# Patient Record
Sex: Female | Born: 1965
Health system: Southern US, Community
[De-identification: ages and names within clinical notes are randomized; demographics above are authoritative.]

## PROBLEM LIST (undated history)

## (undated) ENCOUNTER — Ambulatory Visit (HOSPITAL_COMMUNITY): Admission: EM | Source: Home / Self Care

## (undated) DIAGNOSIS — R112 Nausea with vomiting, unspecified: Secondary | ICD-10-CM

## (undated) DIAGNOSIS — Z9221 Personal history of antineoplastic chemotherapy: Secondary | ICD-10-CM

## (undated) DIAGNOSIS — K649 Unspecified hemorrhoids: Secondary | ICD-10-CM

## (undated) DIAGNOSIS — C50919 Malignant neoplasm of unspecified site of unspecified female breast: Secondary | ICD-10-CM

## (undated) DIAGNOSIS — Z9889 Other specified postprocedural states: Secondary | ICD-10-CM

## (undated) DIAGNOSIS — Z170 Estrogen receptor positive status [ER+]: Principal | ICD-10-CM

## (undated) DIAGNOSIS — C50311 Malignant neoplasm of lower-inner quadrant of right female breast: Secondary | ICD-10-CM

## (undated) DIAGNOSIS — T8859XA Other complications of anesthesia, initial encounter: Secondary | ICD-10-CM

## (undated) DIAGNOSIS — M549 Dorsalgia, unspecified: Secondary | ICD-10-CM

## (undated) DIAGNOSIS — T4145XA Adverse effect of unspecified anesthetic, initial encounter: Secondary | ICD-10-CM

## (undated) DIAGNOSIS — I10 Essential (primary) hypertension: Secondary | ICD-10-CM

## (undated) DIAGNOSIS — K5792 Diverticulitis of intestine, part unspecified, without perforation or abscess without bleeding: Secondary | ICD-10-CM

## (undated) DIAGNOSIS — T7840XA Allergy, unspecified, initial encounter: Secondary | ICD-10-CM

## (undated) DIAGNOSIS — K760 Fatty (change of) liver, not elsewhere classified: Secondary | ICD-10-CM

## (undated) DIAGNOSIS — M2550 Pain in unspecified joint: Secondary | ICD-10-CM

## (undated) DIAGNOSIS — A048 Other specified bacterial intestinal infections: Secondary | ICD-10-CM

## (undated) DIAGNOSIS — K602 Anal fissure, unspecified: Secondary | ICD-10-CM

## (undated) DIAGNOSIS — K5900 Constipation, unspecified: Secondary | ICD-10-CM

## (undated) DIAGNOSIS — Z923 Personal history of irradiation: Secondary | ICD-10-CM

## (undated) DIAGNOSIS — R12 Heartburn: Secondary | ICD-10-CM

## (undated) DIAGNOSIS — K219 Gastro-esophageal reflux disease without esophagitis: Secondary | ICD-10-CM

## (undated) DIAGNOSIS — K648 Other hemorrhoids: Secondary | ICD-10-CM

## (undated) DIAGNOSIS — R0602 Shortness of breath: Secondary | ICD-10-CM

## (undated) HISTORY — DX: Unspecified hemorrhoids: K64.9

## (undated) HISTORY — DX: Shortness of breath: R06.02

## (undated) HISTORY — PX: COLONOSCOPY: SHX174

## (undated) HISTORY — PX: COLON RESECTION: SHX5231

## (undated) HISTORY — DX: Allergy, unspecified, initial encounter: T78.40XA

## (undated) HISTORY — DX: Pain in unspecified joint: M25.50

## (undated) HISTORY — PX: OTHER SURGICAL HISTORY: SHX169

## (undated) HISTORY — DX: Constipation, unspecified: K59.00

## (undated) HISTORY — DX: Dorsalgia, unspecified: M54.9

## (undated) HISTORY — PX: TONSILLECTOMY: SUR1361

## (undated) HISTORY — PX: FOOT SURGERY: SHX648

## (undated) HISTORY — DX: Gastro-esophageal reflux disease without esophagitis: K21.9

## (undated) HISTORY — PX: KNEE SURGERY: SHX244

## (undated) HISTORY — DX: Malignant neoplasm of lower-inner quadrant of right female breast: C50.311

## (undated) HISTORY — DX: Estrogen receptor positive status (ER+): Z17.0

## (undated) HISTORY — DX: Other hemorrhoids: K64.8

## (undated) HISTORY — DX: Other specified bacterial intestinal infections: A04.8

## (undated) HISTORY — DX: Anal fissure, unspecified: K60.2

## (undated) HISTORY — DX: Fatty (change of) liver, not elsewhere classified: K76.0

## (undated) HISTORY — DX: Heartburn: R12

## (undated) HISTORY — PX: TUBAL LIGATION: SHX77

## (undated) HISTORY — DX: Malignant neoplasm of unspecified site of unspecified female breast: C50.919

---

## 1998-06-09 ENCOUNTER — Emergency Department (HOSPITAL_COMMUNITY): Admission: EM | Admit: 1998-06-09 | Discharge: 1998-06-09 | Payer: Self-pay | Admitting: Emergency Medicine

## 1998-06-09 ENCOUNTER — Encounter: Payer: Self-pay | Admitting: Emergency Medicine

## 1998-08-23 ENCOUNTER — Other Ambulatory Visit: Admission: RE | Admit: 1998-08-23 | Discharge: 1998-08-23 | Payer: Self-pay | Admitting: *Deleted

## 2000-12-07 ENCOUNTER — Emergency Department (HOSPITAL_COMMUNITY): Admission: EM | Admit: 2000-12-07 | Discharge: 2000-12-07 | Payer: Self-pay | Admitting: *Deleted

## 2001-04-23 ENCOUNTER — Inpatient Hospital Stay (HOSPITAL_COMMUNITY): Admission: AD | Admit: 2001-04-23 | Discharge: 2001-04-23 | Payer: Self-pay | Admitting: *Deleted

## 2001-04-23 ENCOUNTER — Encounter: Payer: Self-pay | Admitting: *Deleted

## 2002-01-13 ENCOUNTER — Emergency Department (HOSPITAL_COMMUNITY): Admission: EM | Admit: 2002-01-13 | Discharge: 2002-01-13 | Payer: Self-pay | Admitting: Emergency Medicine

## 2002-02-05 ENCOUNTER — Encounter: Admission: RE | Admit: 2002-02-05 | Discharge: 2002-02-05 | Payer: Self-pay | Admitting: Internal Medicine

## 2002-03-24 ENCOUNTER — Encounter: Admission: RE | Admit: 2002-03-24 | Discharge: 2002-03-24 | Payer: Self-pay | Admitting: Internal Medicine

## 2002-10-03 ENCOUNTER — Other Ambulatory Visit: Admission: RE | Admit: 2002-10-03 | Discharge: 2002-10-03 | Payer: Self-pay | Admitting: Obstetrics and Gynecology

## 2003-09-16 ENCOUNTER — Emergency Department (HOSPITAL_COMMUNITY): Admission: EM | Admit: 2003-09-16 | Discharge: 2003-09-17 | Payer: Self-pay | Admitting: Emergency Medicine

## 2003-10-07 ENCOUNTER — Other Ambulatory Visit: Admission: RE | Admit: 2003-10-07 | Discharge: 2003-10-07 | Payer: Self-pay | Admitting: Obstetrics and Gynecology

## 2003-10-13 ENCOUNTER — Ambulatory Visit (HOSPITAL_COMMUNITY): Admission: RE | Admit: 2003-10-13 | Discharge: 2003-10-13 | Payer: Self-pay | Admitting: Obstetrics and Gynecology

## 2004-10-07 ENCOUNTER — Other Ambulatory Visit: Admission: RE | Admit: 2004-10-07 | Discharge: 2004-10-07 | Payer: Self-pay | Admitting: Obstetrics and Gynecology

## 2004-12-23 ENCOUNTER — Emergency Department (HOSPITAL_COMMUNITY): Admission: EM | Admit: 2004-12-23 | Discharge: 2004-12-23 | Payer: Self-pay | Admitting: Emergency Medicine

## 2005-02-21 ENCOUNTER — Emergency Department (HOSPITAL_COMMUNITY): Admission: EM | Admit: 2005-02-21 | Discharge: 2005-02-21 | Payer: Self-pay | Admitting: Emergency Medicine

## 2005-06-16 ENCOUNTER — Ambulatory Visit: Payer: Self-pay | Admitting: Gastroenterology

## 2005-06-26 ENCOUNTER — Ambulatory Visit: Payer: Self-pay | Admitting: Gastroenterology

## 2005-07-04 ENCOUNTER — Ambulatory Visit: Payer: Self-pay | Admitting: Gastroenterology

## 2005-07-07 ENCOUNTER — Encounter (INDEPENDENT_AMBULATORY_CARE_PROVIDER_SITE_OTHER): Payer: Self-pay | Admitting: *Deleted

## 2005-07-07 ENCOUNTER — Ambulatory Visit: Payer: Self-pay | Admitting: Gastroenterology

## 2005-07-07 DIAGNOSIS — K219 Gastro-esophageal reflux disease without esophagitis: Secondary | ICD-10-CM

## 2005-07-07 HISTORY — DX: Gastro-esophageal reflux disease without esophagitis: K21.9

## 2005-08-01 ENCOUNTER — Ambulatory Visit: Payer: Self-pay | Admitting: Gastroenterology

## 2006-02-07 ENCOUNTER — Emergency Department (HOSPITAL_COMMUNITY): Admission: EM | Admit: 2006-02-07 | Discharge: 2006-02-07 | Payer: Self-pay | Admitting: Emergency Medicine

## 2006-03-01 ENCOUNTER — Ambulatory Visit (HOSPITAL_COMMUNITY): Admission: RE | Admit: 2006-03-01 | Discharge: 2006-03-01 | Payer: Self-pay | Admitting: Obstetrics and Gynecology

## 2006-03-01 ENCOUNTER — Other Ambulatory Visit: Admission: RE | Admit: 2006-03-01 | Discharge: 2006-03-01 | Payer: Self-pay | Admitting: Obstetrics and Gynecology

## 2006-04-06 ENCOUNTER — Ambulatory Visit: Payer: Self-pay | Admitting: Gastroenterology

## 2006-04-09 ENCOUNTER — Ambulatory Visit: Payer: Self-pay | Admitting: Internal Medicine

## 2006-04-11 ENCOUNTER — Emergency Department (HOSPITAL_COMMUNITY): Admission: EM | Admit: 2006-04-11 | Discharge: 2006-04-12 | Payer: Self-pay | Admitting: Emergency Medicine

## 2006-05-04 ENCOUNTER — Ambulatory Visit: Payer: Self-pay | Admitting: Gastroenterology

## 2006-06-04 ENCOUNTER — Encounter: Admission: RE | Admit: 2006-06-04 | Discharge: 2006-06-04 | Payer: Self-pay | Admitting: Surgery

## 2006-06-27 ENCOUNTER — Inpatient Hospital Stay (HOSPITAL_COMMUNITY): Admission: RE | Admit: 2006-06-27 | Discharge: 2006-07-02 | Payer: Self-pay | Admitting: Surgery

## 2006-10-15 ENCOUNTER — Ambulatory Visit: Payer: Self-pay | Admitting: Internal Medicine

## 2006-10-29 ENCOUNTER — Ambulatory Visit: Payer: Self-pay | Admitting: Internal Medicine

## 2006-10-31 ENCOUNTER — Ambulatory Visit: Payer: Self-pay

## 2006-11-02 ENCOUNTER — Ambulatory Visit: Payer: Self-pay

## 2007-02-13 ENCOUNTER — Ambulatory Visit: Payer: Self-pay | Admitting: Internal Medicine

## 2007-03-04 ENCOUNTER — Ambulatory Visit (HOSPITAL_COMMUNITY): Admission: RE | Admit: 2007-03-04 | Discharge: 2007-03-04 | Payer: Self-pay | Admitting: Obstetrics and Gynecology

## 2007-03-22 ENCOUNTER — Ambulatory Visit: Payer: Self-pay | Admitting: Internal Medicine

## 2007-04-03 ENCOUNTER — Encounter: Admission: RE | Admit: 2007-04-03 | Discharge: 2007-04-03 | Payer: Self-pay | Admitting: Internal Medicine

## 2007-04-03 ENCOUNTER — Ambulatory Visit: Payer: Self-pay | Admitting: Internal Medicine

## 2007-04-12 ENCOUNTER — Emergency Department (HOSPITAL_COMMUNITY): Admission: EM | Admit: 2007-04-12 | Discharge: 2007-04-12 | Payer: Self-pay | Admitting: Emergency Medicine

## 2007-05-13 ENCOUNTER — Ambulatory Visit: Payer: Self-pay | Admitting: Internal Medicine

## 2007-05-13 ENCOUNTER — Encounter: Payer: Self-pay | Admitting: Internal Medicine

## 2007-05-13 DIAGNOSIS — R1011 Right upper quadrant pain: Secondary | ICD-10-CM | POA: Insufficient documentation

## 2007-05-14 LAB — CONVERTED CEMR LAB
ALT: 16 units/L (ref 0–35)
AST: 20 units/L (ref 0–37)
Albumin: 3.9 g/dL (ref 3.5–5.2)
Alkaline Phosphatase: 63 units/L (ref 39–117)
Amylase: 65 units/L (ref 27–131)
Bilirubin, Direct: 0.1 mg/dL (ref 0.0–0.3)
Lipase: 20 units/L (ref 11.0–59.0)
Total Bilirubin: 0.6 mg/dL (ref 0.3–1.2)
Total Protein: 7.8 g/dL (ref 6.0–8.3)

## 2007-05-17 ENCOUNTER — Encounter: Admission: RE | Admit: 2007-05-17 | Discharge: 2007-05-17 | Payer: Self-pay | Admitting: Internal Medicine

## 2007-06-17 ENCOUNTER — Encounter: Admission: RE | Admit: 2007-06-17 | Discharge: 2007-06-17 | Payer: Self-pay | Admitting: Internal Medicine

## 2007-06-17 ENCOUNTER — Ambulatory Visit: Payer: Self-pay | Admitting: Internal Medicine

## 2007-06-17 LAB — CONVERTED CEMR LAB
Basophils Absolute: 0 10*3/uL (ref 0.0–0.1)
Basophils Relative: 0.4 % (ref 0.0–1.0)
Bilirubin Urine: NEGATIVE
Eosinophils Absolute: 0.1 10*3/uL (ref 0.0–0.6)
Eosinophils Relative: 1 % (ref 0.0–5.0)
HCT: 35.6 % — ABNORMAL LOW (ref 36.0–46.0)
Hemoglobin, Urine: NEGATIVE
Hemoglobin: 12.5 g/dL (ref 12.0–15.0)
Ketones, ur: NEGATIVE mg/dL
Leukocytes, UA: NEGATIVE
Lymphocytes Relative: 35.1 % (ref 12.0–46.0)
MCHC: 35 g/dL (ref 30.0–36.0)
MCV: 89.8 fL (ref 78.0–100.0)
Monocytes Absolute: 0.4 10*3/uL (ref 0.2–0.7)
Monocytes Relative: 7.2 % (ref 3.0–11.0)
Neutro Abs: 3.1 10*3/uL (ref 1.4–7.7)
Neutrophils Relative %: 56.3 % (ref 43.0–77.0)
Nitrite: NEGATIVE
Platelets: 138 10*3/uL — ABNORMAL LOW (ref 150–400)
RBC: 3.96 M/uL (ref 3.87–5.11)
RDW: 12.4 % (ref 11.5–14.6)
Specific Gravity, Urine: 1.01 (ref 1.000–1.03)
Total Protein, Urine: NEGATIVE mg/dL
Urine Glucose: NEGATIVE mg/dL
Urobilinogen, UA: 0.2 (ref 0.0–1.0)
WBC: 5.5 10*3/uL (ref 4.5–10.5)
pH: 6 (ref 5.0–8.0)

## 2007-06-24 ENCOUNTER — Telehealth: Payer: Self-pay | Admitting: Internal Medicine

## 2007-07-26 ENCOUNTER — Ambulatory Visit: Payer: Self-pay | Admitting: Gastroenterology

## 2007-07-26 LAB — CONVERTED CEMR LAB
ALT: 18 units/L (ref 0–35)
AST: 21 units/L (ref 0–37)
Albumin: 3.8 g/dL (ref 3.5–5.2)
Alkaline Phosphatase: 57 units/L (ref 39–117)
Amylase: 57 units/L (ref 27–131)
BUN: 9 mg/dL (ref 6–23)
Basophils Absolute: 0 10*3/uL (ref 0.0–0.1)
Basophils Relative: 0.6 % (ref 0.0–1.0)
Bilirubin, Direct: 0.1 mg/dL (ref 0.0–0.3)
CO2: 29 meq/L (ref 19–32)
Calcium: 9.3 mg/dL (ref 8.4–10.5)
Chloride: 102 meq/L (ref 96–112)
Creatinine, Ser: 0.9 mg/dL (ref 0.4–1.2)
Eosinophils Absolute: 0 10*3/uL (ref 0.0–0.6)
Eosinophils Relative: 0.7 % (ref 0.0–5.0)
Ferritin: 32.5 ng/mL (ref 10.0–291.0)
Folate: 14.1 ng/mL
GFR calc Af Amer: 88 mL/min
GFR calc non Af Amer: 73 mL/min
Glucose, Bld: 89 mg/dL (ref 70–99)
HCT: 35.9 % — ABNORMAL LOW (ref 36.0–46.0)
Hemoglobin: 12.5 g/dL (ref 12.0–15.0)
Iron: 88 ug/dL (ref 42–145)
Lipase: 25 units/L (ref 11.0–59.0)
Lymphocytes Relative: 26.9 % (ref 12.0–46.0)
MCHC: 34.9 g/dL (ref 30.0–36.0)
MCV: 88.8 fL (ref 78.0–100.0)
Monocytes Absolute: 0.4 10*3/uL (ref 0.2–0.7)
Monocytes Relative: 8.1 % (ref 3.0–11.0)
Neutro Abs: 3.5 10*3/uL (ref 1.4–7.7)
Neutrophils Relative %: 63.7 % (ref 43.0–77.0)
Platelets: 132 10*3/uL — ABNORMAL LOW (ref 150–400)
Potassium: 3.5 meq/L (ref 3.5–5.1)
RBC: 4.04 M/uL (ref 3.87–5.11)
RDW: 12.8 % (ref 11.5–14.6)
Saturation Ratios: 22.1 % (ref 20.0–50.0)
Sed Rate: 31 mm/hr — ABNORMAL HIGH (ref 0–25)
Sodium: 137 meq/L (ref 135–145)
TSH: 0.98 microintl units/mL (ref 0.35–5.50)
Total Bilirubin: 0.7 mg/dL (ref 0.3–1.2)
Total Protein: 7.3 g/dL (ref 6.0–8.3)
Transferrin: 284 mg/dL (ref >=212.0)
Vitamin B-12: 200 pg/mL — ABNORMAL LOW (ref 211–911)
WBC: 5.4 10*3/uL (ref 4.5–10.5)

## 2007-07-29 ENCOUNTER — Encounter: Payer: Self-pay | Admitting: Internal Medicine

## 2007-07-29 ENCOUNTER — Ambulatory Visit: Payer: Self-pay | Admitting: Gastroenterology

## 2007-07-29 ENCOUNTER — Encounter: Payer: Self-pay | Admitting: Gastroenterology

## 2007-08-01 ENCOUNTER — Encounter: Payer: Self-pay | Admitting: Gastroenterology

## 2007-08-01 LAB — CONVERTED CEMR LAB: CRP, High Sensitivity: 7.9 — ABNORMAL HIGH

## 2007-08-16 ENCOUNTER — Ambulatory Visit: Payer: Self-pay | Admitting: Internal Medicine

## 2007-08-16 DIAGNOSIS — K219 Gastro-esophageal reflux disease without esophagitis: Secondary | ICD-10-CM | POA: Insufficient documentation

## 2007-08-16 DIAGNOSIS — Z8719 Personal history of other diseases of the digestive system: Secondary | ICD-10-CM | POA: Insufficient documentation

## 2007-08-16 DIAGNOSIS — J309 Allergic rhinitis, unspecified: Secondary | ICD-10-CM | POA: Insufficient documentation

## 2007-08-16 DIAGNOSIS — I10 Essential (primary) hypertension: Secondary | ICD-10-CM | POA: Insufficient documentation

## 2007-08-23 ENCOUNTER — Telehealth: Payer: Self-pay | Admitting: Internal Medicine

## 2007-08-23 ENCOUNTER — Ambulatory Visit: Payer: Self-pay | Admitting: Internal Medicine

## 2007-09-17 ENCOUNTER — Ambulatory Visit: Payer: Self-pay | Admitting: Internal Medicine

## 2007-09-17 DIAGNOSIS — M26629 Arthralgia of temporomandibular joint, unspecified side: Secondary | ICD-10-CM | POA: Insufficient documentation

## 2007-10-07 ENCOUNTER — Telehealth: Payer: Self-pay | Admitting: Internal Medicine

## 2007-10-07 DIAGNOSIS — M722 Plantar fascial fibromatosis: Secondary | ICD-10-CM | POA: Insufficient documentation

## 2007-10-08 ENCOUNTER — Encounter (INDEPENDENT_AMBULATORY_CARE_PROVIDER_SITE_OTHER): Payer: Self-pay | Admitting: *Deleted

## 2008-01-27 ENCOUNTER — Encounter: Admission: RE | Admit: 2008-01-27 | Discharge: 2008-01-27 | Payer: Self-pay | Admitting: Occupational Medicine

## 2008-03-11 ENCOUNTER — Telehealth: Payer: Self-pay | Admitting: Internal Medicine

## 2008-03-11 ENCOUNTER — Ambulatory Visit: Payer: Self-pay | Admitting: Internal Medicine

## 2008-03-12 ENCOUNTER — Emergency Department (HOSPITAL_COMMUNITY): Admission: EM | Admit: 2008-03-12 | Discharge: 2008-03-12 | Payer: Self-pay | Admitting: *Deleted

## 2008-03-20 ENCOUNTER — Ambulatory Visit: Payer: Self-pay | Admitting: Internal Medicine

## 2008-04-07 ENCOUNTER — Ambulatory Visit (HOSPITAL_COMMUNITY): Admission: RE | Admit: 2008-04-07 | Discharge: 2008-04-07 | Payer: Self-pay | Admitting: Obstetrics and Gynecology

## 2008-05-14 ENCOUNTER — Ambulatory Visit: Payer: Self-pay | Admitting: Internal Medicine

## 2008-05-20 ENCOUNTER — Ambulatory Visit: Payer: Self-pay | Admitting: Internal Medicine

## 2008-05-20 DIAGNOSIS — K648 Other hemorrhoids: Secondary | ICD-10-CM | POA: Insufficient documentation

## 2008-05-21 ENCOUNTER — Encounter: Payer: Self-pay | Admitting: Internal Medicine

## 2008-05-29 ENCOUNTER — Ambulatory Visit (HOSPITAL_COMMUNITY): Admission: RE | Admit: 2008-05-29 | Discharge: 2008-05-29 | Payer: Self-pay | Admitting: Internal Medicine

## 2008-06-08 ENCOUNTER — Encounter: Payer: Self-pay | Admitting: Internal Medicine

## 2008-07-08 ENCOUNTER — Encounter: Payer: Self-pay | Admitting: Internal Medicine

## 2008-10-12 ENCOUNTER — Ambulatory Visit: Payer: Self-pay | Admitting: Endocrinology

## 2008-10-12 DIAGNOSIS — R519 Headache, unspecified: Secondary | ICD-10-CM | POA: Insufficient documentation

## 2008-10-12 DIAGNOSIS — R51 Headache: Secondary | ICD-10-CM | POA: Insufficient documentation

## 2008-10-14 LAB — CONVERTED CEMR LAB: Sed Rate: 33 mm/hr — ABNORMAL HIGH (ref 0–22)

## 2008-10-23 ENCOUNTER — Encounter (INDEPENDENT_AMBULATORY_CARE_PROVIDER_SITE_OTHER): Payer: Self-pay | Admitting: *Deleted

## 2009-03-24 ENCOUNTER — Encounter (INDEPENDENT_AMBULATORY_CARE_PROVIDER_SITE_OTHER): Payer: Self-pay | Admitting: *Deleted

## 2009-03-24 ENCOUNTER — Ambulatory Visit: Payer: Self-pay | Admitting: Internal Medicine

## 2009-04-20 ENCOUNTER — Ambulatory Visit: Payer: Self-pay | Admitting: Internal Medicine

## 2009-04-22 ENCOUNTER — Ambulatory Visit (HOSPITAL_COMMUNITY): Admission: RE | Admit: 2009-04-22 | Discharge: 2009-04-22 | Payer: Self-pay | Admitting: Obstetrics and Gynecology

## 2009-04-27 ENCOUNTER — Ambulatory Visit: Payer: Self-pay | Admitting: Gastroenterology

## 2009-04-27 DIAGNOSIS — R1031 Right lower quadrant pain: Secondary | ICD-10-CM | POA: Insufficient documentation

## 2009-04-27 LAB — CONVERTED CEMR LAB
ALT: 14 units/L (ref 0–35)
AST: 21 units/L (ref 0–37)
Albumin: 3.9 g/dL (ref 3.5–5.2)
Alkaline Phosphatase: 53 units/L (ref 39–117)
BUN: 10 mg/dL (ref 6–23)
Basophils Absolute: 0 10*3/uL (ref 0.0–0.1)
Basophils Relative: 0.8 % (ref 0.0–3.0)
Bilirubin, Direct: 0.1 mg/dL (ref 0.0–0.3)
CO2: 29 meq/L (ref 19–32)
Calcium: 9.3 mg/dL (ref 8.4–10.5)
Chloride: 105 meq/L (ref 96–112)
Creatinine, Ser: 0.9 mg/dL (ref 0.4–1.2)
Eosinophils Absolute: 0 10*3/uL (ref 0.0–0.7)
Eosinophils Relative: 0.7 % (ref 0.0–5.0)
Ferritin: 30.7 ng/mL (ref 10.0–291.0)
Folate: 9 ng/mL
GFR calc non Af Amer: 86.84 mL/min (ref >60)
Glucose, Bld: 85 mg/dL (ref 70–99)
HCT: 37.2 % (ref 36.0–46.0)
Hemoglobin: 12.8 g/dL (ref 12.0–15.0)
Iron: 76 ug/dL (ref 42–145)
Lymphocytes Relative: 30.8 % (ref 12.0–46.0)
Lymphs Abs: 1.4 10*3/uL (ref 0.7–4.0)
MCHC: 34.3 g/dL (ref 30.0–36.0)
MCV: 89.9 fL (ref 78.0–100.0)
Monocytes Absolute: 0.4 10*3/uL (ref 0.1–1.0)
Monocytes Relative: 9.1 % (ref 3.0–12.0)
Neutro Abs: 2.8 10*3/uL (ref 1.4–7.7)
Neutrophils Relative %: 58.6 % (ref 43.0–77.0)
Platelets: 124 10*3/uL — ABNORMAL LOW (ref 150.0–400.0)
Potassium: 4 meq/L (ref 3.5–5.1)
RBC: 4.14 M/uL (ref 3.87–5.11)
RDW: 12.8 % (ref 11.5–14.6)
Saturation Ratios: 19.5 % — ABNORMAL LOW (ref 20.0–50.0)
Sed Rate: 28 mm/hr — ABNORMAL HIGH (ref 0–22)
Sodium: 140 meq/L (ref 135–145)
TSH: 1.65 microintl units/mL (ref 0.35–5.50)
Total Bilirubin: 0.8 mg/dL (ref 0.3–1.2)
Total Protein: 7.2 g/dL (ref 6.0–8.3)
Transferrin: 278 mg/dL (ref 212.0–360.0)
Vitamin B-12: 316 pg/mL (ref 211–911)
WBC: 4.6 10*3/uL (ref 4.5–10.5)

## 2009-07-02 ENCOUNTER — Telehealth (INDEPENDENT_AMBULATORY_CARE_PROVIDER_SITE_OTHER): Payer: Self-pay | Admitting: *Deleted

## 2009-07-02 ENCOUNTER — Ambulatory Visit: Payer: Self-pay | Admitting: Internal Medicine

## 2009-09-24 ENCOUNTER — Telehealth: Payer: Self-pay | Admitting: Internal Medicine

## 2009-10-18 ENCOUNTER — Emergency Department (HOSPITAL_COMMUNITY): Admission: EM | Admit: 2009-10-18 | Discharge: 2009-10-18 | Payer: Self-pay | Admitting: Emergency Medicine

## 2010-02-10 ENCOUNTER — Ambulatory Visit: Payer: Self-pay | Admitting: Internal Medicine

## 2010-02-10 DIAGNOSIS — K2990 Gastroduodenitis, unspecified, without bleeding: Secondary | ICD-10-CM

## 2010-02-10 DIAGNOSIS — E538 Deficiency of other specified B group vitamins: Secondary | ICD-10-CM | POA: Insufficient documentation

## 2010-02-10 DIAGNOSIS — K2970 Gastritis, unspecified, without bleeding: Secondary | ICD-10-CM | POA: Insufficient documentation

## 2010-02-10 LAB — CONVERTED CEMR LAB
BUN: 10 mg/dL (ref 6–23)
Basophils Absolute: 0 10*3/uL (ref 0.0–0.1)
Basophils Relative: 0.7 % (ref 0.0–3.0)
CO2: 31 meq/L (ref 19–32)
Calcium: 9.5 mg/dL (ref 8.4–10.5)
Chloride: 101 meq/L (ref 96–112)
Creatinine, Ser: 0.9 mg/dL (ref 0.4–1.2)
Eosinophils Absolute: 0 10*3/uL (ref 0.0–0.7)
Eosinophils Relative: 0.3 % (ref 0.0–5.0)
Folate: 15.1 ng/mL
GFR calc non Af Amer: 86.54 mL/min (ref >60)
Glucose, Bld: 92 mg/dL (ref 70–99)
HCT: 37.7 % (ref 36.0–46.0)
Hemoglobin: 13.2 g/dL (ref 12.0–15.0)
Lymphocytes Relative: 25.7 % (ref 12.0–46.0)
Lymphs Abs: 1.8 10*3/uL (ref 0.7–4.0)
MCHC: 35 g/dL (ref 30.0–36.0)
MCV: 92.2 fL (ref 78.0–100.0)
Monocytes Absolute: 0.6 10*3/uL (ref 0.1–1.0)
Monocytes Relative: 8.1 % (ref 3.0–12.0)
Neutro Abs: 4.5 10*3/uL (ref 1.4–7.7)
Neutrophils Relative %: 65.2 % (ref 43.0–77.0)
Platelets: 137 10*3/uL — ABNORMAL LOW (ref 150.0–400.0)
Potassium: 4 meq/L (ref 3.5–5.1)
RBC: 4.09 M/uL (ref 3.87–5.11)
RDW: 12.9 % (ref 11.5–14.6)
Sodium: 139 meq/L (ref 135–145)
TSH: 1.12 microintl units/mL (ref 0.35–5.50)
Vitamin B-12: 300 pg/mL (ref 211–911)
WBC: 6.9 10*3/uL (ref 4.5–10.5)

## 2010-02-13 ENCOUNTER — Encounter: Payer: Self-pay | Admitting: Internal Medicine

## 2010-03-07 ENCOUNTER — Ambulatory Visit: Payer: Self-pay | Admitting: Internal Medicine

## 2010-03-07 LAB — CONVERTED CEMR LAB
ALT: 13 units/L (ref 0–35)
AST: 21 units/L (ref 0–37)
Albumin: 4.2 g/dL (ref 3.5–5.2)
Alkaline Phosphatase: 55 units/L (ref 39–117)
Amylase: 46 units/L (ref 27–131)
BUN: 13 mg/dL (ref 6–23)
Basophils Absolute: 0 10*3/uL (ref 0.0–0.1)
Basophils Relative: 0.3 % (ref 0.0–3.0)
Bilirubin, Direct: 0.1 mg/dL (ref 0.0–0.3)
CO2: 32 meq/L (ref 19–32)
Calcium: 9.6 mg/dL (ref 8.4–10.5)
Chloride: 104 meq/L (ref 96–112)
Creatinine, Ser: 1.1 mg/dL (ref 0.4–1.2)
Eosinophils Absolute: 0 10*3/uL (ref 0.0–0.7)
Eosinophils Relative: 0.5 % (ref 0.0–5.0)
GFR calc non Af Amer: 71.62 mL/min (ref >60)
Glucose, Bld: 85 mg/dL (ref 70–99)
HCT: 39 % (ref 36.0–46.0)
Hemoglobin: 13.5 g/dL (ref 12.0–15.0)
Lipase: 7 units/L — ABNORMAL LOW (ref 11.0–59.0)
Lymphocytes Relative: 29.1 % (ref 12.0–46.0)
Lymphs Abs: 1.7 10*3/uL (ref 0.7–4.0)
MCHC: 34.7 g/dL (ref 30.0–36.0)
MCV: 91.8 fL (ref 78.0–100.0)
Monocytes Absolute: 0.5 10*3/uL (ref 0.1–1.0)
Monocytes Relative: 9.3 % (ref 3.0–12.0)
Neutro Abs: 3.6 10*3/uL (ref 1.4–7.7)
Neutrophils Relative %: 60.8 % (ref 43.0–77.0)
Platelets: 134 10*3/uL — ABNORMAL LOW (ref 150.0–400.0)
Potassium: 4 meq/L (ref 3.5–5.1)
RBC: 4.24 M/uL (ref 3.87–5.11)
RDW: 13.3 % (ref 11.5–14.6)
Sodium: 140 meq/L (ref 135–145)
Total Bilirubin: 0.5 mg/dL (ref 0.3–1.2)
Total Protein: 7.3 g/dL (ref 6.0–8.3)
WBC: 5.9 10*3/uL (ref 4.5–10.5)

## 2010-03-08 ENCOUNTER — Ambulatory Visit: Payer: Self-pay | Admitting: Internal Medicine

## 2010-03-13 ENCOUNTER — Encounter: Payer: Self-pay | Admitting: Internal Medicine

## 2010-03-19 ENCOUNTER — Ambulatory Visit: Payer: Self-pay | Admitting: Family Medicine

## 2010-03-19 DIAGNOSIS — J019 Acute sinusitis, unspecified: Secondary | ICD-10-CM | POA: Insufficient documentation

## 2010-09-11 ENCOUNTER — Encounter: Payer: Self-pay | Admitting: Obstetrics and Gynecology

## 2010-09-22 NOTE — Letter (Signed)
[facility] [address] [place], [place]  [address] Phone: [phone]      March 13, 2010   [name] [name] [address] [place], [place] [address]  RE:  LAB RESULTS  Dear  [name],  The following is an interpretation of your most recent lab tests.  Please take note of any instructions provided or changes to medications that have resulted from your lab work.  ELECTROLYTES:  Good - no changes needed  KIDNEY FUNCTION TESTS:  Good - no changes needed   DIABETIC STUDIES:  Excellent - no changes needed Blood Glucose: 85    CBC:  Good - no changes needed  Lipase and amylase - looking for pancreatic disease, normal.   CT abdomen and pelvis was normal.    Sincerely Yours,    Jacques Navy MD  atient: [name] [name] Note: All result statuses are Final unless otherwise noted.  Tests: (1) CBC Platelet w/Diff (CBCD)   White Cell Count          5.9 K/uL                    4.5-10.5   Red Cell Count            4.24 Mil/uL                 3.87-5.11   Hemoglobin                13.5 g/dL                   62.9-52.8   Hematocrit                39.0 %                      36.0-46.0   MCV                       91.8 fl                     78.0-100.0   MCHC                      34.7 g/dL                   41.3-24.4   RDW                       13.3 %                      11.5-14.6   Platelet Count       [L]  134.0 K/uL                  150.0-400.0   Neutrophil %              60.8 %                      43.0-77.0   Lymphocyte %              29.1 %                      12.0-46.0   Monocyte %                9.3 %  3.0-12.0   Eosinophils%              0.5 %                       0.0-5.0   Basophils %               0.3 %                       0.0-3.0   Neutrophill Absolute      3.6 K/uL                    1.4-7.7   Lymphocyte Absolute       1.7 K/uL                    0.7-4.0   Monocyte Absolute         0.5 K/uL                    0.1-1.0  Eosinophils, Absolute                             0.0 K/uL                    0.0-0.7   Basophils Absolute        0.0 K/uL                    0.0-0.1  Tests: (2) BMP (METABOL)   Sodium                    140 mEq/L                   135-145   Potassium                 4.0 mEq/L                   3.5-5.1   Chloride                  104 mEq/L                   96-112   Carbon Dioxide            32 mEq/L                    19-32   Glucose                   85 mg/dL                    04-54   BUN                       13 mg/dL                    0-98   Creatinine                1.1 mg/dL                   1.1-9.1   Calcium                   9.6 mg/dL  8.4-10.5   GFR                       71.62 mL/min                >60  Tests: (3) Hepatic/Liver Function Panel (HEPATIC)   Total Bilirubin           0.5 mg/dL                   6.6-4.4   Direct Bilirubin          0.1 mg/dL                   0.3-4.7   Alkaline Phosphatase      55 U/L                      39-117   AST                       21 U/L                      0-37   ALT                       13 U/L                      0-35   Total Protein             7.3 g/dL                    4.2-5.9   Albumin                   4.2 g/dL                    5.6-3.8  Tests: (4) Amylase (AMYL)   Amylase                   46 U/L                      27-131  Tests: (5) Lipase (LIPASE)   Lipase               [L]  7.0 U/L                     11.0-59.0  T ABD/PELVIS W CM - [id number]   Clinical Data: Right-sided abdominal pain.  Possible appendicitis.   CT ABDOMEN AND PELVIS WITH CONTRAST   Technique:  Multidetector CT imaging of the abdomen and pelvis was performed following the standard protocol during bolus administration of intravenous contrast.   Contrast: 125 ml Omnipaque-300   Comparison: 06/17/2007   Findings: There are a few 3 mm low densities scattered about the liver highly likely to represent benign cysts.  No  worrisome finding.  No calcified gallstones.  The spleen, pancreas, adrenal glands and kidneys are normal except for a 13 mm cyst at the lower pole of the right kidney.  The aorta shows mild atherosclerotic change but no aneurysm.  The IVC is normal.  No retroperitoneal mass or adenopathy.   The pelvis, uterus and adnexal regions appear normal except for a ventral fundal leiomyoma measuring 2 cm in diameter.  There is no free fluid.   The appendix is well seen and is normal.  No definable bowel pathology.   IMPRESSION: Normal appearing appendix.   Normal appearing adnexa.   13 mm cyst at the lower pole of the right kidney.   Read By:  Thomasenia Sales,  Judie Petit.D.

## 2010-09-22 NOTE — Assessment & Plan Note (Signed)
   Vital Signs:  Patient Profile:   45 Years Old Female Height:     67 inches Weight:      237 pounds Temp:     97.3 degrees F Pulse rate:   79 / minute BP sitting:   141 / 95                 PCP:  Vernel Donlan  Chief Complaint:  epigastric pain.  History of Present Illness: epigastric pain. Seen in ER 3 weeks ago for this problem. Given MS and phenergan. Sent home with Rx for antacid which she did not fill. Pain resolved but now with recurrence. IN addition pain is in the right upper quadrant, with radiation to shoulder blade, causes paresthesia Riht UE and hand. Pain awakens her night. Pain is worse after meals. No diarrhea. No light stools. Postive for flatus.        Physical Exam  General:     alert, well-developed, well-hydrated, cooperative to examination, and overweight-appearing.   Abdomen:     BS positive x 4. Tneder in the epigastrum. Very tender RUQ with radiation to the back. No G/R.    Impression & Recommendations:  Problem # 1:  SYMPTOM, PAIN, ABDOMINAL, RIGHT UP QUADRANT (ICD-789.01) sympotms very suggestive of cholelithiasis. Pt is overweight female, 45, fertile.  Plan: lab - LFT's, amylase, lipase. GB u/s.     ]

## 2010-09-22 NOTE — Letter (Signed)
Summary: [facility] Consult Scheduled Letter  [facility]  [address] [place], [place] [address]   Phone: [phone]  Fax: [phone]      10/23/2008 MRN: [id number]  [facility] [address] [place], [place]  [address]    Dear [name] [name],      We have scheduled an appointment for you.  At the recommendation of Dr.Ellison, we have scheduled you a consult with Dr Thad Ranger on 11/25/08 at 8:30am.  Their phone number is [phone].  If this appointment day and time is not convenient for you, please feel free to call the office of the doctor you are being referred to at the number listed above and reschedule the appointment.     [facility] [address] [place] [address] [place], [place] [address]    Thank you,  Patient Care Coordinator [facility]

## 2010-09-22 NOTE — Assessment & Plan Note (Signed)
Summary: allergic  rhinitis mbw   Vital Signs:  Patient profile:   45 year old female Height:      65 inches Weight:      227.25 pounds BMI:     37.95 O2 Sat:      98 % on Room air Pulse rate:   88 / minute BP sitting:   120 / 64  (left arm) Cuff size:   regular  Vitals Entered By: Reynaldo Minium CMA (April 20, 2009 1:32 PM)  O2 Flow:  Room air  Primary Provider/Referring Provider:  norins  CC:  Allergy Consult-Dr. Debby Bud.  History of Present Illness: 04/20/09- new patient for allergy evaluation at request of Dr Debby Bud with primary concern of recurrent headaches over past year, worse over past 2 months. Describing occipaital pain radiating forward behind eyes. Eyes feel swollen. States she has had frequent sinus headaches. Antibiotics had seemed to help, lasting 2 weeks, then recurrent. We had talked for only about 2 minutes, I had been called out for a hospital call, and on return she asked "how long is this going to be, I have to get back to work?". I  outlined the kind of questions I would ask and some potential evaluation. She said "nah that's ok", and left.  Current Medications (verified): 1)  Benicar Hct 40-25 Mg  Tabs (Olmesartan Medoxomil-Hctz) .... Take 1 Tablet By Mouth Once A Day 2)  Darvocet A500 100-500 Mg Tabs (Propoxyphene N-Apap) .Marland Kitchen.. 1 Q4h As Needed Headache 3)  Promethazine Hcl 12.5 Mg Tabs (Promethazine Hcl) .Marland Kitchen.. 1-2 Q4h As Needed Nausea  Allergies (verified): 1)  ! Lisinopril  Physical Exam  Additional Exam:  NOT EXAMINED   Other Orders: No Charge Patient Arrived (NCPA0) (NCPA0)

## 2010-09-22 NOTE — Progress Notes (Signed)
Summary: Ref req  Phone Note Call from Patient Call back at Home Phone [phone] Call back at [phone]   Summary of Call: Patient is requesting referral to Dr Jarold Motto for recent GI issues Initial call taken by: Lamar Sprinkles,  June 24, 2007 5:46 PM  Follow-up for Phone Call        OK. Order forwarded for referral to [facility] Follow-up by: Jacques Navy MD,  June 24, 2007 5:51 PM  Additional Follow-up for Phone Call Additional follow up Details #1::        Left mess w/pt Additional Follow-up by: Lamar Sprinkles,  June 25, 2007 6:41 PM

## 2010-09-22 NOTE — Assessment & Plan Note (Signed)
Summary: dizzy/lightheaded upon standing/SD   Vital Signs:  Patient profile:   45 year old female Height:      65 inches Weight:      208 pounds BMI:     34.74 O2 Sat:      99 % on Room air Temp:     98.9 degrees F oral Pulse rate:   102 / minute BP sitting:   112 / 76  (left arm) Cuff size:   regular  Vitals Entered By: Bill Salinas CMA (March 07, 2010 1:24 PM)  O2 Flow:  Room air CC: pt here with c/o of being light headed with dizziness and lower abd pain/ ab   Primary Care Provider:  Illene Regulus, MD  CC:  pt here with c/o of being light headed with dizziness and lower abd pain/ ab.  History of Present Illness: Patient presents for dizziness that started saturday: she has not passed out, she does admit to mild diploplia, no room movement or true vertigo, no nausea, no tinnitus. No fever nor chills. No productive cough. no paresthesia, no focal weakness. She does c/o feeling hot.   She c/o mid-abdomenial pain. she has had regular bowel movements with loose greens stools. No blood in the stool, no mucus.  No out of town travel, no eating places. She has taken lyrica for abdominal pain which did put her to sleep but did not relieve her pain.   Current Medications (verified): 1)  Benicar Hct 40-25 Mg  Tabs (Olmesartan Medoxomil-Hctz) .... Take 1 Tablet By Mouth Once A Day 2)  Pristiq 50 Mg Xr24h-Tab (Desvenlafaxine Succinate) 3)  Trazodone Hcl 50 Mg Tabs (Trazodone Hcl)  Allergies (verified): 1)  ! Lisinopril  Past History:  Past Medical History: Last updated: 02/10/2010 OTHER MALAISE AND FATIGUE (ICD-780.79) VITAMIN B12 DEFICIENCY (ICD-266.2) ABDOMINAL PAIN RIGHT LOWER QUADRANT (ICD-789.03) HEADACHE (ICD-784.0) HEMORRHOIDS, INTERNAL (ICD-455.0) PLANTAR FASCIITIS (ICD-728.71) TEMPOROMANDIBULAR JOINT PAIN (ICD-524.62) Hx of GASTRITIS (ICD-535.50) ALLERGIC RHINITIS (ICD-477.9) GERD (ICD-530.81) HYPERTENSION (ICD-401.9) DIVERTICULITIS, HX OF (ICD-V12.79) SYMPTOM,  PAIN, ABDOMINAL, RIGHT UP QUADRANT (ICD-789.01)  Past Surgical History: Last updated: 08/16/2007 sigmoid colectomy s/p c-section Tubal ligation Tonsillectomy  Family History: Last updated: 05/08/2009 father-deceased 01-11-75 with MI mother - 76: HTN Neg - breast or colon cancer; DM Family History Hypertension - 1 brothers No FH of Colon Cancer:  Social History: Last updated: 03/24/2009 HSG, [place] business married '00 2 sons - '81, '87; 1 duaghter 01/11/1979 work: mfg scheduling in a print business marriage [place] Never Smoked Alcohol use-no  Risk Factors: Smoking Status: never (08/16/2007)  Review of Systems       The patient complains of abdominal pain.  The patient denies anorexia, fever, weight loss, weight gain, vision loss, hoarseness, chest pain, dyspnea on exertion, prolonged cough, melena, hematochezia, severe indigestion/heartburn, suspicious skin lesions, difficulty walking, unusual weight change, enlarged lymph nodes, angioedema, and breast masses.    Physical Exam  General:  alert, well-developed, well-nourished, well-hydrated, and normal appearance.   Head:  normocephalic and no abnormalities observed.   Eyes:  pupils equal, pupils round, corneas and lenses clear, no optic disk abnormalities, and no retinal abnormalitiies.   Ears:  R ear normal and L ear normal.   Mouth:  good dentition, no gingival abnormalities, no pharyngeal crowing, and no lesions.   Neck:  supple.   Lungs:  normal respiratory effort and normal breath sounds.   Heart:  normal rate and regular rhythm.   Abdomen:  hypoactive BS, no masses, no HSM.  Most tender in the RUQ just above the level of the umbilicus. Also tender at McBurney's point. No guarding or rebound Msk:  normal ROM, no joint tenderness, no joint swelling, no joint warmth, and no joint instability.   Pulses:  2+ radial  Extremities:  No clubbing, cyanosis, edema, or deformity noted with normal full range of motion of all joints.    Neurologic:  alert & oriented X3, cranial nerves II-XII intact, gait normal, and DTRs symmetrical and normal.  Can induce "dizziness" by head movement.  Skin:  turgor normal, color normal, and no ulcerations.   Cervical Nodes:  no anterior cervical adenopathy and no posterior cervical adenopathy.   Psych:  Oriented X3, memory intact for recent and remote, normally interactive, and good eye contact.     Impression & Recommendations:  Problem # 1:  DYSEQUILIBRIUM (ICD-780.4)  Based on generally norma lneuro exam and the ability to induce symptoms with head movement suspect labyrinthitis.  Plan - meclizine 12.5 mg q 6  Her updated medication list for this problem includes:    Meclizine Hcl 12.5 Mg Tabs (Meclizine hcl) .Marland Kitchen... 1 by mouth q 6 as needed dizziness  Problem # 2:  ABDOMINAL PAIN, RIGHT UPPER QUADRANT (ICD-789.01) Patient with persistent abdominal pain with tenderness on exam. Suspect duodenitis but cannot r/o ovarian mass or other internal infection.  Plan - lab evaluation           CT abdomen and pelvis.           Trial of PPI - samples of nexium 40mg  qAM provided.   Orders: TLB-CBC Platelet - w/Differential (85025-CBCD) TLB-BMP (Basic Metabolic Panel-BMET) (80048-METABOL) TLB-Hepatic/Liver Function Pnl (80076-HEPATIC) TLB-Amylase (82150-AMYL) TLB-Lipase (83690-LIPASE) Radiology Referral (Radiology)  Addendum - CBC, Bmet, LFTs, amylase and lipase all normal.  CT pending  Complete Medication List: 1)  Benicar Hct 40-25 Mg Tabs (Olmesartan medoxomil-hctz) .... Take 1 tablet by mouth once a day 2)  Pristiq 50 Mg Xr24h-tab (Desvenlafaxine succinate) 3)  Trazodone Hcl 50 Mg Tabs (Trazodone hcl) 4)  Meclizine Hcl 12.5 Mg Tabs (Meclizine hcl) .Marland Kitchen.. 1 by mouth q 6 as needed dizziness 5)  Nexium 40 Mg Cpdr (Esomeprazole magnesium) .Marland Kitchen.. 1 by mouth  qam  Patient Instructions: 1)  dizziness - acts like labyrinthitis - inner ear. Plan meclizine 12.5 mg every 6 hours as needed  for dizziness. 2)  Abdominal pain - concenred for acid irritation of the stomach and/or duodenum (first part of the intestine). Plan - nexium 40mg  once every morning. Labwork, CT abdomen and pelvis.  Prescriptions: MECLIZINE HCL 12.5 MG TABS (MECLIZINE HCL) 1 by mouth q 6 as needed dizziness  #30 x 2   Entered and Authorized by:   Jacques Navy MD   Signed by:   Jacques Navy MD on 03/07/2010   Method used:   Electronically to        [facility]. [phone]* (retail)       [address]       [place], [place]  [address]       Ph: [phone]       Fax: [phone]   RxID:   [id number]

## 2010-09-22 NOTE — Progress Notes (Signed)
Summary: Benicar samples  Phone Note Call from Patient Call back at [facility] Phone [phone]   Summary of Call: Patient called for samples of Benicar. Placed up front for pick up. Patient aware. Initial call taken by: Lucious Groves,  September 24, 2009 10:45 AM    Prescriptions: BENICAR HCT 40-25 MG  TABS (OLMESARTAN MEDOXOMIL-HCTZ) Take 1 tablet by mouth once a day  #35 x 0   Entered by:   Lucious Groves   Authorized by:   Jacques Navy MD   Signed by:   Lucious Groves on 09/24/2009   Method used:   Samples Given   RxID:   [id number]

## 2010-09-22 NOTE — Assessment & Plan Note (Signed)
Summary: EAR INFECTION/NWS   Vital Signs:  Patient Profile:   45 Years Old Female Height:     67 inches Temp:     98.2 degrees F oral Pulse rate:   70 / minute BP sitting:   136 / 84  (left arm) Cuff size:   regular  Vitals Entered By: Zackery Barefoot CMA (September 17, 2007 5:41 PM)                 PCP:  Jeven Topper  Chief Complaint:  RIGHT EAR FULLNESS/RIGHT SIDE MOUTH SORE X 1 WEEK.  History of Present Illness: Having right ear fullness and pain. This has been going on three weeks. Also developed a sore in her mouth which interferes with eating. No fever, has had chills. Has been taking tylenol.  Current Allergies: No known allergies       Physical Exam  General:     Well-developed,well-nourished,in no acute distress; alert,appropriate and cooperative throughout examination Head:     very tender to palpation over Right TMJ, no click with jaw movement. Ears:     EACs clear bilaterally, Right TM normal Mouth:     athous ulcer on right buccal membrane    Impression & Recommendations:  Problem # 1:  TEMPOROMANDIBULAR JOINT PAIN (ICD-524.62) no evidence of infection. Tender over TMJ. Patient denies gum chewing, does have pain with chewing food, denies bruxism.  Plan: information from "Care Notes." Aleve 2 tabs two times a day, aspercreme to TMJ.  Problem # 2:  APHTHOUS ULCERS (ICD-528.2) simple apthous ulcer right buccal membrane.  Plan: viscous xylocain swish and spit as needed.  Complete Medication List: 1)  Benicar Hct 40-25 Mg Tabs (Olmesartan medoxomil-hctz) .... Take 1 tablet by mouth once a day 2)  Promethazine-codeine 6.25-10 Mg/77ml Syrp (Promethazine-codeine) .Marland Kitchen.. 1 tsp by mouth qid prn 3)  Tylenol Extra Strength 500 Mg Tabs (Acetaminophen) .... As needed     ]

## 2010-09-22 NOTE — Progress Notes (Signed)
Summary: Ears ache  Phone Note Call from Patient Call back at Home Phone [phone]   Summary of Call: Pt req apt today, she says that her ears still ache.  Initial call taken by: Lamar Sprinkles,  August 23, 2007 9:01 AM  Follow-up for Phone Call        seen 12/26 and treated with z-pak. I am happy to see her today. Follow-up by: Jacques Navy MD,  August 23, 2007 12:25 PM  Additional Follow-up for Phone Call Additional follow up Details #1::        Pt informed  Additional Follow-up by: Lamar Sprinkles,  August 23, 2007 12:47 PM

## 2010-09-22 NOTE — Assessment & Plan Note (Signed)
Summary: ear ache SD   Vital Signs:  Patient Profile:   45 Years Old Female Height:     67 inches Weight:      240 pounds Temp:     98.9 degrees F oral Pulse rate:   75 / minute BP sitting:   144 / 89  (left arm) Cuff size:   large  Vitals Entered By: Zackery Barefoot CMA (August 23, 2007 2:45 PM)                 PCP:  norins  Chief Complaint:  Left earache/tinnitus/PND.  History of Present Illness: C/0 earach for several weeks. See Dec 26th diagnoses with sinusitis with a red TM, red throat with exudate and clear lungs. Treated with Z-Pak and phenergan with codeine. Pt still with ear pain. Throat pain is better. Still with sinus drainage - clear mucus.  Current Allergies: No known allergies       Physical Exam  General:     alert, well-developed, well-nourished, well-hydrated, and overweight-appearing.   Ears:     TMs normal, good landmarks. Mouth:     pharynx pink and moist, no erythema, no exudates, and no posterior lymphoid hypertrophy.   Lungs:     Normal respiratory effort, chest expands symmetrically. Lungs are clear to auscultation, no crackles or wheezes.    Impression & Recommendations:  Problem # 1:  URI (ICD-465.9) antibiotics have cleared reported erythema throat and TMs. Still with rhinorrhea and congestion.  Her updated medication list for this problem includes:    Promethazine-codeine 6.25-10 Mg/53ml Syrp (Promethazine-codeine) .Marland Kitchen... 1 tsp by mouth qid prn    Tylenol Extra Strength 500 Mg Tabs (Acetaminophen) .Marland Kitchen... As needed  Plan: allegra D 12 hour  7 days of samples.   Complete Medication List: 1)  Benicar Hct 40-25 Mg Tabs (Olmesartan medoxomil-hctz) .... Take 1 tablet by mouth once a day 2)  Promethazine-codeine 6.25-10 Mg/62ml Syrp (Promethazine-codeine) .Marland Kitchen.. 1 tsp by mouth qid prn 3)  Tylenol Extra Strength 500 Mg Tabs (Acetaminophen) .... As needed     ]

## 2010-09-22 NOTE — Assessment & Plan Note (Signed)
Summary: SEVERE STOMACH PAIN X ALMOST 2 WKS---$50---STC   Vital Signs:  Patient Profile:   45 Years Old Female Height:     67 inches Temp:     99.3 degrees F oral Pulse rate:   80 / minute BP sitting:   136 / 80  (left arm) Cuff size:   large  Vitals Entered By: Zackery Barefoot CMA (May 14, 2008 2:18 PM)                 PCP:  norins  Chief Complaint:  Stomach discomfort, heartburn, and & constipation x 3 weeks/blurred vision x months.  History of Present Illness: Pateint presents for 3 week h/o increasing abdominal pain which is diffuse. She is having trouble with constipation andhas pain with bowel movement. It seems that she cannot evacuate. In addition, she has pain in the RUQ with radiation to the back. Pain is associated with eating. No eructation. She has light, clay colored stools.     Prior Medications Reviewed Using: Patient Recall  Updated Prior Medication List: BENICAR HCT 40-25 MG  TABS (OLMESARTAN MEDOXOMIL-HCTZ) Take 1 tablet by mouth once a day TYLENOL EXTRA STRENGTH 500 MG  TABS (ACETAMINOPHEN) as needed CORRECTOL EXTRA GENTLE 100 MG CAPS (DOCUSATE SODIUM) as needed  Current Allergies: ! LISINOPRIL  Past Medical History:    Reviewed history from 03/11/2008 and no changes required:       obstipation       Hypertension  Past Surgical History:    Reviewed history from 08/16/2007 and no changes required:       sigmoid colectomy       s/p c-section       Tubal ligation       Tonsillectomy     Review of Systems       The patient complains of weight loss and abdominal pain.  The patient denies anorexia, fever, weight gain, hoarseness, chest pain, dyspnea on exertion, hematuria, muscle weakness, difficulty walking, and enlarged lymph nodes.     Physical Exam  General:     heavy-set AA female, NAD Head:     Normocephalic and atraumatic without obvious abnormalities. No apparent alopecia or balding. Neck:     No deformities, masses, or  tenderness noted. Lungs:     normal respiratory effort.   Heart:     normal rate and regular rhythm.   Abdomen:     BS+ x 4 quadrants, diffusely tender to percussion worst at the LLQ, no masses, abdomen no doughy. Tender to percussion over the RUQ, no palpable GB bulb.    Impression & Recommendations:  Problem # 1:  CONSTIPATION (ICD-564.00) Continued problem. Has had colonsocopy which is reveiwed. This was a normal study. Dr. Jarold Motto thought there was colonic inertia. She tried miralax without success.  Plan: MOM 30 cc two times a day or three times a day to regulate BMs.  Her updated medication list for this problem includes:    Correctol Extra Gentle 100 Mg Caps (Docusate sodium) .Marland Kitchen... As needed   Problem # 2:  SYMPTOM, PAIN, ABDOMINAL, RIGHT UP QUADRANT (ICD-789.01) chart reviewed: CT abdomen Nov '08 was normal. U/S abdomen Sept '08 with normal appearing gallbladder without stones. Her symptoms are classic for gallbladder dysfunction.  Plan: hepato-biliary scan. If abnormal refer to surgery. Orders: Radiology Referral (Radiology) Surgical Referral (Surgery)   Complete Medication List: 1)  Benicar Hct 40-25 Mg Tabs (Olmesartan medoxomil-hctz) .... Take 1 tablet by mouth once a day 2)  Tylenol Extra Strength  500 Mg Tabs (Acetaminophen) .... As needed 3)  Correctol Extra Gentle 100 Mg Caps (Docusate sodium) .... As needed    ]

## 2010-09-22 NOTE — Assessment & Plan Note (Signed)
Summary: HAS A KNOT IN STOMACH/YF    History of Present Illness Visit Type: follow up  Primary GI MD: Sheryn Bison MD FACP FAGA Primary Provider: Illene Regulus, MD Requesting Provider: n/a Chief Complaint: Pt states RLQ abd pain x 4 months that is getting worse History of Present Illness:   [name] [name] is a 45 year old African American female with previous sigmoid resection because of recurrent diverticulitis. She's had colonic inertia with chronic partial constipation for many years recently worsened over the last several months with associated right lower quadrant pain. She's been seen by her gynecologist and apparently had a negative gynecologic exam and vaginal ultrasound. She denies hepatobiliary or upper gastrointestinal problems.  There is good weight is stable. She does take daily MiraLax and p.r.n. Darvocet and Phenergan. Her painful episodes she will awaken her from sleep and lasts several minutes in duration, or crampy, and had no other precipitating or alleviating elements. She is worried that the pain is" like it was before". Child describes periodic bright red blood per rectum with straining and has known hemorrhoidal disease. Follows regular diet denies a specific food intolerances. Family history is noncontributory.She does have a normal menstrual periods has not had gynecologic surgery other than adhesion lysis.   GI Review of Systems    Reports abdominal pain.     Location of  Abdominal pain: lower abdomen.    Denies acid reflux, belching, bloating, chest pain, dysphagia with liquids, dysphagia with solids, heartburn, loss of appetite, nausea, vomiting, vomiting blood, weight loss, and  weight gain.      Reports constipation.     Denies anal fissure, black tarry stools, change in bowel habit, diarrhea, diverticulosis, fecal incontinence, heme positive stool, hemorrhoids, irritable bowel syndrome, jaundice, light color stool, liver problems, rectal bleeding, and  rectal pain.     Current Medications (verified): 1)  Benicar Hct 40-25 Mg  Tabs (Olmesartan Medoxomil-Hctz) .... Take 1 Tablet By Mouth Once A Day 2)  Darvocet A500 100-500 Mg Tabs (Propoxyphene N-Apap) .Marland Kitchen.. 1 Q4h As Needed Headache 3)  Promethazine Hcl 12.5 Mg Tabs (Promethazine Hcl) .Marland Kitchen.. 1-2 Q4h As Needed Nausea  Allergies (verified): 1)  ! Lisinopril  Past History:  Past medical, surgical, family and social histories (including risk factors) reviewed for relevance to current acute and chronic problems.  Past Medical History: Reviewed history from 03/11/2008 and no changes required. obstipation Hypertension  Past Surgical History: Reviewed history from 08/16/2007 and no changes required. sigmoid colectomy s/p c-section Tubal ligation Tonsillectomy  Family History: Reviewed history from 03/24/2009 and no changes required. father-deceased 1975/01/09 with MI mother - 7: HTN Neg - breast or colon cancer; DM Family History Hypertension - 1 brothers No FH of Colon Cancer:  Social History: Reviewed history from 03/24/2009 and no changes required. [place], [place] - [employer] business married '00 2 sons - '81, '87; 1 duaghter 1979/01/09 work: mfg scheduling in a print business [place] Never Smoked Alcohol use-no  Vital Signs:  Patient profile:   45 year old female Height:      65 inches Weight:      224 pounds BMI:     37.41 BSA:     2.08 Pulse rate:   76 / minute Pulse rhythm:   regular BP sitting:   132 / 80  (left arm) Cuff size:   regular  Vitals Entered By: Ok Anis CMA (April 27, 2009 2:39 PM)  Physical Exam  General:  Well developed, well nourished, no acute distress.healthy appearing and obese.  Head:  Normocephalic and atraumatic. Eyes:  PERRLA, no icterus. Lungs:  Clear throughout to auscultation. Heart:  Regular rate and rhythm; no murmurs, rubs,  or bruits. Abdomen:  Soft, nontender and nondistended. No masses, hepatosplenomegaly or hernias noted. Normal bowel  sounds.obese.   Rectal:  Normal exam.hemocult negative.   Extremities:  No clubbing, cyanosis, edema or deformities noted. Neurologic:  Alert and  oriented x4;  grossly normal neurologically. Cervical Nodes:  No significant cervical adenopathy. Inguinal Nodes:  No significant inguinal adenopathy. Psych:  Alert and cooperative. Normal mood and affect.   Impression & Recommendations:  Problem # 1:  ABDOMINAL PAIN RIGHT LOWER QUADRANT (ICD-789.03) Assessment Unchanged Probable recurrent adhesions in a patient with chronic obstipation. Apparently recent GYN exam but manually and by ultrasound has been normal. I have asked her to try Amitiza 24 micrograms twice a day while continuing nightly MiraLax and p.r.n. Darvocet-N 100. Repeat labs have been drawn for review and I will see her back in several weeks for followup.We may need to consider repeat laparoscopic examination pending her clinical course. Orders: TLB-CBC Platelet - w/Differential (85025-CBCD) TLB-BMP (Basic Metabolic Panel-BMET) (80048-METABOL) TLB-Hepatic/Liver Function Pnl (80076-HEPATIC) TLB-TSH (Thyroid Stimulating Hormone) (84443-TSH) TLB-B12, Serum-Total ONLY (34742-V95) TLB-Ferritin (82728-FER) TLB-Folic Acid (Folate) (82746-FOL) TLB-IBC Pnl (Iron/FE;Transferrin) (83550-IBC) TLB-Sedimentation Rate (ESR) (85652-ESR)  Problem # 2:  HEMORRHOIDS, INTERNAL (ICD-455.0) Assessment: Improved local hemorrhoidal care as needed  Patient Instructions: 1)  Copy sent to : Dr. Illene Regulus 2)  Please continue current medications.  3)  Trial of Probiotics for 2 weeks 4)   Trial of Amitiza 24 micrograms b.i.d. 5)  Labs pending  Appended Document: HAS A KNOT IN STOMACH/YF    Clinical Lists Changes  Medications: Added new medication of AMITIZA 24 MCG  CAPS (LUBIPROSTONE) 1 two times a day/take with food and water Added new medication of ALIGN   CAPS (MISC INTESTINAL FLORA REGULAT) Take one capsule by mouth daily

## 2010-09-22 NOTE — Assessment & Plan Note (Signed)
Summary: FU--STC   PRESSED "1" TO CX/ LMOM TO CONFIRM/NWS   Vital Signs:  Patient profile:   45 year old female Height:      65 inches Weight:      212 pounds BMI:     35.41 O2 Sat:      98 % on Room air Temp:     98.8 degrees F oral Pulse rate:   75 / minute BP sitting:   138 / 82  (left arm) Cuff size:   regular  Vitals Entered By: Bill Salinas CMA (February 10, 2010 4:15 PM)  O2 Flow:  Room air  Primary Care Provider:  Illene Regulus, MD   History of Present Illness: Patient presents c/o fatigue and malaise. She is concerned she is B12 deficient. Chart reviewed: in Sept '10 CBC with nl Hgb and MCV. IN '08 patient had B12 of 200 (211-890) with a nl MCV. She has had left colectomy but no surgery at the ileum.  She c/o a knot in her right side that she can feel when supine. She has no pain, no change in bowel habit.  Current Medications (verified): 1)  Benicar Hct 40-25 Mg  Tabs (Olmesartan Medoxomil-Hctz) .... Take 1 Tablet By Mouth Once A Day 2)  Pristiq 50 Mg Xr24h-Tab (Desvenlafaxine Succinate)  Allergies (verified): 1)  ! Lisinopril  Past History:  Past Medical History: OTHER MALAISE AND FATIGUE (ICD-780.79) VITAMIN B12 DEFICIENCY (ICD-266.2) ABDOMINAL PAIN RIGHT LOWER QUADRANT (ICD-789.03) HEADACHE (ICD-784.0) HEMORRHOIDS, INTERNAL (ICD-455.0) PLANTAR FASCIITIS (ICD-728.71) TEMPOROMANDIBULAR JOINT PAIN (ICD-524.62) Hx of GASTRITIS (ICD-535.50) ALLERGIC RHINITIS (ICD-477.9) GERD (ICD-530.81) HYPERTENSION (ICD-401.9) DIVERTICULITIS, HX OF (ICD-V12.79) SYMPTOM, PAIN, ABDOMINAL, RIGHT UP QUADRANT (ICD-789.01)  Past Surgical History: Reviewed history from 08/16/2007 and no changes required. sigmoid colectomy s/p c-section Tubal ligation Tonsillectomy  Family History: Reviewed history from 04/27/2009 and no changes required. father-deceased 01/18/1975 with MI mother - 71: HTN Neg - breast or colon cancer; DM Family History Hypertension - 1 brothers No FH of Colon  Cancer:  Social History: Reviewed history from 03/24/2009 and no changes required. 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    Physical Exam  General:  overweight AA female in no distress Head:  normocephalic and atraumatic.   Eyes:  conjunctiva corneas and lenses clear and no injection.   Lungs:  normal respiratory effort.   Heart:  normal rate and regular rhythm.   Abdomen:  soft and normal bowel sounds.  Diffuse tenderness to deep palpation with increased tenderness in the RUQ. Tender to percussion over the right lower rib cage. No palpable mass or tumor. Pulses:  2+ radial Skin:  turgor normal and color normal.   Psych:  Oriented X3, normally interactive, and good eye contact.     Impression & Recommendations:  Problem # 1:  SYMPTOM, PAIN, ABDOMINAL, RIGHT UP QUADRANT (ICD-789.01) Patient with tenderness but no gallbladder bulb, no radiation of pain to the scapula. No palpable masses. GB unlikely with no fatty food intolerance of change in bowels and only tenderness to deep palpation. The knot she feels may be related to changes with adhesions.  The following medications were removed from the medication list:    Amitiza 24 Mcg Caps (Lubiprostone) .Marland Kitchen... 1 two times  a day/take with food and water  Problem # 2:  VITAMIN B12 DEFICIENCY (ICD-266.2) Patient was B112 deficient in '08 with nl MCV on CBC  Plan - CBC, B12 level. If deficient will treat short term with nascobal.   Orders: TLB-B12 + Folate Pnl (16109_60454-U98/JXB) TLB-CBC Platelet - w/Differential (85025-CBCD)  Problem # 3:  OTHER MALAISE AND FATIGUE (ICD-780.79) Patinet just feels low on energy.  Plan - check  thyroid function, glucose and lytes.   Orders: TLB-BMP (Basic Metabolic Panel-BMET) (80048-METABOL) TLB-TSH (Thyroid Stimulating Hormone) (84443-TSH)  Complete Medication List: 1)  Benicar Hct 40-25 Mg Tabs (Olmesartan medoxomil-hctz) .... Take 1 tablet by mouth once a day 2)  Pristiq 50 Mg Xr24h-tab (Desvenlafaxine succinate)

## 2010-09-22 NOTE — Assessment & Plan Note (Signed)
Summary: COUGH GETTING WORST/HAD X 2 WKS/NML   Vital Signs:  Patient Profile:   45 Years Old Female Height:     67 inches Temp:     98.1 degrees F oral Pulse rate:   88 / minute BP sitting:   163 / 95  (right arm) Cuff size:   large  Vitals Entered By: Zackery Barefoot CMA (August 16, 2007 2:42 PM)                 Preventive Care Screening  Colonoscopy:    Date:  07/29/2007    Results:  abnormal    Chief Complaint:  Cough.  Current Allergies (reviewed today): No known allergies  Updated/Current Medications (including changes made in today's visit):  BENICAR HCT 40-25 MG  TABS (OLMESARTAN MEDOXOMIL-HCTZ) Take 1 tablet by mouth once a day AZITHROMYCIN 250 MG  TABS (AZITHROMYCIN) 2 by mouth once daily for 1 day, then 1 by mouth once daily for 4 days, then stop PROMETHAZINE-CODEINE 6.25-10 MG/5ML  SYRP (PROMETHAZINE-CODEINE) 1 tsp by mouth qid prn   Past Medical History:    Diverticulitis, hx of    Hypertension    GERD    Allergic rhinitis  Past Surgical History:    Reviewed history and no changes required:       sigmoid colectomy       s/p c-section       Tubal ligation       Tonsillectomy   Family History:    Reviewed history and no changes required:       father with MI       Family History Hypertension - 3 brothers         Social History:    Reviewed history and no changes required:       Never Smoked       Alcohol use-no   Risk Factors:  Tobacco use:  never Alcohol use:  no  Colonoscopy History:     Date of Last Colonoscopy:  07/29/2007    Results:  abnormal     Physical Exam  General:     mild ill Head:     Normocephalic and atraumatic without obvious abnormalities. No apparent alopecia or balding. Eyes:     No corneal or conjunctival inflammation noted. EOMI. Perrla. Ears:     right tm severe red Nose:     nasal dischargemucosal pallor.   Mouth:      - severepharyngeal erythema and pharyngeal exudate.   Neck:     cervical  lymphadenopathy.   Lungs:     Normal respiratory effort, chest expands symmetrically. Lungs are clear to auscultation, no crackles or wheezes. Heart:     Normal rate and regular rhythm. S1 and S2 normal without gallop, murmur, click, rub or other extra sounds. Extremities:     no edema    Impression & Recommendations:  Problem # 1:  SINUSITIS- ACUTE-NOS (ICD-461.9)  Her updated medication list for this problem includes:    Azithromycin 250 Mg Tabs (Azithromycin) .Marland Kitchen... 2 by mouth once daily for 1 day, then 1 by mouth once daily for 4 days, then stop    Promethazine-codeine 6.25-10 Mg/13ml Syrp (Promethazine-codeine) .Marland Kitchen... 1 tsp by mouth qid prn  tx as above  Problem # 2:  HYPERTENSION (ICD-401.9)  Her updated medication list for this problem includes:    Benicar Hct 40-25 Mg Tabs (Olmesartan medoxomil-hctz) .Marland Kitchen... Take 1 tablet by mouth once a day  o/w stable, cont meds as is  Problem #  3:  ALLERGIC RHINITIS (ICD-477.9) not on tx for now, consider use claritin otc  Complete Medication List: 1)  Benicar Hct 40-25 Mg Tabs (Olmesartan medoxomil-hctz) .... Take 1 tablet by mouth once a day 2)  Azithromycin 250 Mg Tabs (Azithromycin) .... 2 by mouth once daily for 1 day, then 1 by mouth once daily for 4 days, then stop 3)  Promethazine-codeine 6.25-10 Mg/16ml Syrp (Promethazine-codeine) .Marland Kitchen.. 1 tsp by mouth qid prn   Patient Instructions: 1)  Take all medications as prescribed 2)  Please schedule a follow-up appointment as needed.    Prescriptions: PROMETHAZINE-CODEINE 6.25-10 MG/5ML  SYRP (PROMETHAZINE-CODEINE) 1 tsp by mouth qid prn  #8 oz x 1   Entered and Authorized by:   Corwin Levins MD   Signed by:   Corwin Levins MD on 08/16/2007   Method used:   Print then Give to Patient   RxID:   [id number] AZITHROMYCIN 250 MG  TABS (AZITHROMYCIN) 2 by mouth once daily for 1 day, then 1 by mouth once daily for 4 days, then stop  #6 x 1   Entered and Authorized by:   Corwin Levins  MD   Signed by:   Corwin Levins MD on 08/16/2007   Method used:   Print then Give to Patient   RxID:   [id number]  ]

## 2010-09-22 NOTE — Progress Notes (Signed)
Summary: APT TODAY?   Phone Note Call from Patient Call back at [phone]   Caller: Patient Call For: Dr Debby Bud Summary of Call: Pt has face pain, sore throat, pain around eyes & mouth -- she questions possible sinus infection? She would prefer to see Dr Debby Bud, please advise. Initial call taken by: Verdell Face,  July 02, 2009 9:52 AM  Follow-up for Phone Call        OK with me to add on - can be seen late in the day without a chaperone. Follow-up by: Jacques Navy MD,  July 02, 2009 1:15 PM  Additional Follow-up for Phone Call Additional follow up Details #1::        Appt today 11/12 @ 4:30 - aware she is being worked into a packed schedule and she will not see another M.D. Additional Follow-up by: Verdell Face,  July 02, 2009 2:11 PM

## 2010-09-22 NOTE — Letter (Signed)
   [facility] [address] [place], [place]  [address] Phone: [phone]      June 08, 2008   [name] [name] [address] [place], [place] [address]  RE:  LAB RESULTS  Dear  Ms. Tocci,  The following is an interpretation of your most recent lab tests.  Please take note of any instructions provided or changes to medications that have resulted from your lab work.   Hepato-biliary scan done October 12th was normal: normal gallbladder function with normal filling and contraction.   Good news: gallbladder is normal and not the source of any abdominal discomfort.  Call or e-mail me if you have questions ([email or web address]).   Sincerely Yours,    Jacques Navy MD

## 2010-09-22 NOTE — Letter (Signed)
Summary: Possible gastroesophageal reflux/[facility]  Possible gastroesophageal reflux/[facility]   Imported By: Lester [place] 08/03/2008 09:31:23  _____________________________________________________________________  External Attachment:    Type:   Image     Comment:   External Document

## 2010-09-22 NOTE — Assessment & Plan Note (Signed)
Summary: sore throat/ok to workin per men/cd   Vital Signs:  Patient profile:   45 year old female Height:      65 inches Weight:      226 pounds BMI:     37.74 O2 Sat:      98 % on Room air Temp:     98.5 degrees F oral Pulse rate:   78 / minute BP sitting:   140 / 90  (left arm) Cuff size:   large  Vitals Entered By: Bill Salinas CMA (July 02, 2009 4:56 PM)  O2 Flow:  Room air CC: pt complains of sore throat with sinus pressure and earache x 2 days/ ab   Primary Care Provider:  Illene Regulus, MD  CC:  pt complains of sore throat with sinus pressure and earache x 2 days/ ab.  History of Present Illness: Patient with a two day history of pain across the bridge of her nose, infraorbital region, facial swelling, gums are swolled, lips are swollen. Her mouth is very sore so that anyting touching her mouth is painful and swallowing is painful. She has felt febrile. She reports pain across the maxilla. No N/V/D.  She has had this set of symptoms in the past. She was recently seen by Dr. Maple Hudson for allergy evaluation but the evaluation was not completed.  Current Medications (verified): 1)  Benicar Hct 40-25 Mg  Tabs (Olmesartan Medoxomil-Hctz) .... Take 1 Tablet By Mouth Once A Day 2)  Darvocet A500 100-500 Mg Tabs (Propoxyphene N-Apap) .Marland Kitchen.. 1 Q4h As Needed Headache 3)  Promethazine Hcl 12.5 Mg Tabs (Promethazine Hcl) .Marland Kitchen.. 1-2 Q4h As Needed Nausea 4)  Amitiza 24 Mcg  Caps (Lubiprostone) .Marland Kitchen.. 1 Two Times A Day/take With Food and Water 5)  Align   Caps (Misc Intestinal Flora Regulat) .... Take One Capsule By Mouth Daily  Allergies (verified): 1)  ! 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  The patient denies hoarseness, chest pain, dyspnea on exertion, peripheral edema, and abdominal pain.    Physical Exam  General:  WNWD AA female in no acute distress.  Head:  She does have mild facial swelling, more right than left. Eyes:  pupils equal, pupils round, corneas and lenses clear, and no injection.   Ears:  Left TM rosy, right TM normal Nose:  Nostrils clear with visible polyps. mucosa erythematous with no visible purulence Mouth:  Posterior pharynx with erythema, small tonsils, no frank exudate. Neck:  supple, no masses, and no thyromegaly.   Lungs:  normal respiratory effort and normal breath sounds.   Heart:  normal rate, regular rhythm, and no JVD.   Abdomen:  soft and normal bowel sounds.   Neurologic:  alert & oriented X3, cranial nerves II-XII intact, and gait normal.   Skin:  turgor normal, color normal, and no rashes.   Cervical Nodes:  no anterior cervical adenopathy  and no posterior cervical adenopathy.   Psych:  Oriented X3, normally interactive, and good eye contact.  Frustrated   Impression & Recommendations:  Problem # 1:  SINUSITIS- ACUTE-NOS (ICD-461.9) Patient with recurrent sinusitis. She had a previous episode several months ago.  Plan - Augmentin 875mg  two times a day x 7           supportive care           For repeat episode will obtain CT sinus to r/o chronic sinusitis.  Her updated medication list for this problem includes:    Augmentin 875-125 Mg Tabs (Amoxicillin-pot clavulanate) .Marland Kitchen... 1 by mouth two times a day  Complete Medication List: 1)  Benicar Hct 40-25 Mg Tabs (Olmesartan  medoxomil-hctz) .... Take 1 tablet by mouth once a day 2)  Darvocet A500 100-500 Mg Tabs (Propoxyphene n-apap) .Marland Kitchen.. 1 q4h as needed headache 3)  Promethazine Hcl 12.5 Mg Tabs (Promethazine hcl) .Marland Kitchen.. 1-2 q4h as needed nausea 4)  Amitiza 24 Mcg Caps (Lubiprostone) .Marland Kitchen.. 1 two times a day/take with food and water 5)  Align Caps (Misc intestinal flora regulat) .... Take one capsule by mouth daily 6)  Augmentin 875-125 Mg Tabs (Amoxicillin-pot clavulanate) .Marland Kitchen.. 1 by mouth two times a day 7)  Prednisone 10 Mg Tabs (Prednisone) .... 3 tabs once daily x 3, 2 tabs once daily x 3, 1 tab once daily x 6 Prescriptions: PREDNISONE 10 MG TABS (PREDNISONE) 3 tabs once daily x 3, 2 tabs once daily x 3, 1 tab once daily x 6  #21 x 0   Entered and Authorized by:   Jacques Navy MD   Signed by:   Jacques Navy MD on 07/02/2009   Method used:   Electronically to        [facility]. [phone]* (retail)       [address]       [place], [place]  [address]       Ph: [phone]       Fax: [phone]   RxID:   [id number] AUGMENTIN 875-125 MG TABS (AMOXICILLIN-POT CLAVULANATE) 1 by mouth two times a day  #14 x 0   Entered and Authorized by:   Jacques Navy MD   Signed by:   Jacques Navy MD on 07/02/2009   Method used:   Electronically to        [facility]. [phone]* (retail)       [address]       [place], [place]  [address]       Ph: [phone]       Fax: [phone]   RxID:   [id number]    Preventive Care Screening  Last Tetanus Booster:    Date:  09/21/2005    Results:  Historical

## 2010-09-22 NOTE — Assessment & Plan Note (Signed)
Summary: SHARP L LEG PAIN/SORE THROAT/CHEST & HEAD HURTS/SNEEZ'G-$50  STC   Vital Signs:  Patient Profile:   45 Years Old Female Height:     67 inches Weight:      224 pounds Temp:     98.8 degrees F oral Pulse rate:   80 / minute BP sitting:   141 / 85  (left arm) Cuff size:   regular  Vitals Entered By: Zackery Barefoot CMA (March 11, 2008 12:00 PM)                 PCP:  norins  Chief Complaint:  Headache/runny eyes with pressure/sore throat/pressure behind right ear x 2days/Left arm & left leg pain with weakness.Marland Kitchen  History of Present Illness: Patient recently switched to lisinopril from Benicar. she has now developed angioedema with swelling of the lips, neck, periorbital tissue. She has put on several pounds of weight, most likely fluid.. She is uncomfortable but not complaining of any respiratory problems. She is having watery eyes.     Updated Prior Medication List: BENICAR HCT 40-25 MG  TABS (OLMESARTAN MEDOXOMIL-HCTZ) Take 1 tablet by mouth once a day TYLENOL EXTRA STRENGTH 500 MG  TABS (ACETAMINOPHEN) as needed  Current Allergies: ! LISINOPRIL  Past Medical History:    Reviewed history from 09/03/2007 and no changes required:       obstipation       Hypertension  Past Surgical History:    Reviewed history from 08/16/2007 and no changes required:       sigmoid colectomy       s/p c-section       Tubal ligation       Tonsillectomy     Review of Systems       The patient complains of hoarseness, dyspnea on exertion, and peripheral edema.  The patient denies anorexia, fever, weight loss, weight gain, decreased hearing, syncope, headaches, hemoptysis, muscle weakness, difficulty walking, and abnormal bleeding.     Physical Exam  General:     generalized edema, notable about the face. Head:     Normocephalic and atraumatic without obvious abnormalities. No apparent alopecia or balding. Mouth:     no swelling of the tongue or uvula. Lips are  swollen Neck:     No deformities, masses, or tenderness noted. Lungs:     normal respiratory effort, no intercostal retractions, no accessory muscle use, normal breath sounds, and no wheezes.   Heart:     normal rate and regular rhythm.      Impression & Recommendations:  Problem # 1:  ADVERSE DRUG REACTION (ICD-995.20) patient with on-set of generalized angioedema mostly affection periorbital region, lips, neck. No respiratory symptoms. suspect this is a reaction to ACE-I medication.  Plan: solumedrol 125mg  IM given in the office. Prednisone 60mg  x1, 40mg  once daily x 3, 30mg  once daily x 3, 20mg  once daily x 6. Loratadine 10mg  bid x 7 days. Patient to call back Friday, July 24th with up-date. If she has any  breathing problems come immediatly to office or ER. Orders: Solumedrol up to 125mg  (E4540) Admin of Therapeutic Inj  intramuscular or subcutaneous ([id number])   Complete Medication List: 1)  Benicar Hct 40-25 Mg Tabs (Olmesartan medoxomil-hctz) .... Take 1 tablet by mouth once a day 2)  Tylenol Extra Strength 500 Mg Tabs (Acetaminophen) .... As needed 3)  Prednisone 10 Mg Tabs (Prednisone) .... 6 tabs day #1, 4 tabs once daily x 3, 3 tabs once daily x 3, 20mg  once daily x  3, 1 tab once daily x 6.   Patient Instructions: 1)  Allergy or more likely a drug reaction to Lisinopril: plan - gave solumedrol today in the office; Prednisone 60mg  x 1, 40mg  once daily x 3, 30mg  once daily x 3, 20mg  once daily x 3, 10mg  once daily x 6. Take loratadine (OTC) 10mg  AM, PM x 7 days. For increased swelling or any respiratory symptoms come immediately to the office of if after hours go to the ER. Call Friday July 24th to give me an up-date.   Prescriptions: PREDNISONE 10 MG  TABS (PREDNISONE) 6 tabs day #1, 4 tabs once daily x 3, 3 tabs once daily x 3, 20mg  once daily x 3, 1 tab once daily x 6.  #40 x 0   Entered and Authorized by:   Jacques Navy MD   Signed by:   Jacques Navy MD on  03/11/2008   Method used:   Electronically sent to ...       [facility] [address]. [phone]*       [address]       [place], [place]  [address]       Ph: [phone]       Fax: [phone]   RxID:   [id number]  ]  Medication Administration  Injection # 1:    Medication: Solumedrol up to 125mg     Diagnosis: ADVERSE DRUG REACTION (ICD-995.20)    Route: IM    Site: LUOQ gluteus    Exp Date: 10/20/2010    Lot #: [id number]    Mfr: Pharmacia    Comments: 2mL    Patient tolerated injection without complications    Given by: Dr. Debby Bud  Orders Added: 1)  Est. Patient Level III [44010] 2)  Solumedrol up to 125mg  [J2930] 3)  Admin of Therapeutic Inj  intramuscular or subcutaneous [27253]

## 2010-09-22 NOTE — Letter (Signed)
[facility] [address] [place], [place]  [address] Phone: [phone]      February 14, 2010   [facility] [address] [place], [place] [address]  RE:  LAB RESULTS  Dear  Ms. Murata,  The following is an interpretation of your most recent lab tests.  Please take note of any instructions provided or changes to medications that have resulted from your lab work.  ELECTROLYTES:  Good - no changes needed  KIDNEY FUNCTION TESTS:  Good - no changes needed  THYROID STUDIES:  Thyroid studies normal TSH: 1.12     DIABETIC STUDIES:  Excellent - no changes needed Blood Glucose: 92    CBC:  Good - no changes needed  Lab results are all within normal limits.   Sincerely Yours,    Jacques Navy MD  Patient: [name] [name] Note: All result statuses are Final unless otherwise noted.  Tests: (1) B12 + Folate Panel (B12/FOL)   Vitamin B12               300 pg/mL                   211-911   Folate                    15.1 ng/mL     Deficient  0.4 - 3.4 ng/mL     Indeterminate  3.4 - 5.4 ng/mL     Normal  >5.4 ng/mL  Tests: (2) CBC Platelet w/Diff (CBCD)   White Cell Count          6.9 K/uL                    4.5-10.5   Red Cell Count            4.09 Mil/uL                 3.87-5.11   Hemoglobin                13.2 g/dL                   27.2-53.6   Hematocrit                37.7 %                      36.0-46.0   MCV                       92.2 fl                     78.0-100.0   MCHC                      35.0 g/dL                   64.4-03.4   RDW                       12.9 %                      11.5-14.6   Platelet Count       [L]  137.0 K/uL                  150.0-400.0   Neutrophil %  65.2 %                      43.0-77.0   Lymphocyte %              25.7 %                      12.0-46.0   Monocyte %                8.1 %                       3.0-12.0   Eosinophils%              0.3 %                       0.0-5.0   Basophils %                0.7 %                       0.0-3.0   Neutrophill Absolute      4.5 K/uL                    1.4-7.7   Lymphocyte Absolute       1.8 K/uL                    0.7-4.0   Monocyte Absolute         0.6 K/uL                    0.1-1.0  Eosinophils, Absolute                             0.0 K/uL                    0.0-0.7   Basophils Absolute        0.0 K/uL                    0.0-0.1  Tests: (3) BMP (METABOL)   Sodium                    139 mEq/L                   135-145   Potassium                 4.0 mEq/L                   3.5-5.1   Chloride                  101 mEq/L                   96-112   Carbon Dioxide            31 mEq/L                    19-32   Glucose                   92 mg/dL                    16-10   BUN  10 mg/dL                    1-61   Creatinine                0.9 mg/dL                   0.9-6.0   Calcium                   9.5 mg/dL                   4.5-40.9   GFR                       86.54 mL/min                >60  Tests: (4) TSH (TSH)   FastTSH                   1.12 uIU/mL                 0.35-5.50

## 2010-09-22 NOTE — Assessment & Plan Note (Signed)
Summary: PER SARAH SCHED--DIZZINESS---STC   Vital Signs:  Patient profile:   45 year old female Weight:      209 pounds Temp:     98.4 degrees F oral Pulse rate:   76 / minute Pulse rhythm:   regular BP sitting:   122 / 70  (left arm) Cuff size:   regular  Vitals Entered By: Lowella Petties CMA (March 19, 2010 9:37 AM) CC: Sinus congestion, dizzy   History of Present Illness: 45 year old female:  the patient is a very pleasant 89 her old female patient of Dr. Debby Bud, who is here in followup, with some continued vertigo, and some significant sinus  pressure and pain.  She's had sinus symptoms for 2 weeks now, and  she has a long-standing history of sinus  problems as well.  Feels a whole light congestion Blinding and will get lightheaded.  Turn, feels like a burst, or like it is  Has been having some sinus congestion and problems  when she turns her head, she feels as if the room is spinning.   review of systems is otherwise normal. The patient has no other complaints aside from above. She does have some mild sore throat. Some ear fullness. She does have pain behind her eyes and in the maxillary and frontal sinus areas.  Clinical Review Panels:  CBC   WBC:  5.9 (03/07/2010)   RBC:  4.24 (03/07/2010)   Hgb:  13.5 (03/07/2010)   Hct:  39.0 (03/07/2010)   Platelets:  134.0 (03/07/2010)   MCV  91.8 (03/07/2010)   MCHC  34.7 (03/07/2010)   RDW  13.3 (03/07/2010)   PMN:  60.8 (03/07/2010)   Lymphs:  29.1 (03/07/2010)   Monos:  9.3 (03/07/2010)   Eosinophils:  0.5 (03/07/2010)   Basophil:  0.3 (03/07/2010)  Complete Metabolic Panel   Glucose:  85 (03/07/2010)   Sodium:  140 (03/07/2010)   Potassium:  4.0 (03/07/2010)   Chloride:  104 (03/07/2010)   CO2:  32 (03/07/2010)   BUN:  13 (03/07/2010)   Creatinine:  1.1 (03/07/2010)   Albumin:  4.2 (03/07/2010)   Total Protein:  7.3 (03/07/2010)   Calcium:  9.6 (03/07/2010)   Total Bili:  0.5 (03/07/2010)   Alk Phos:  55  (03/07/2010)   SGPT (ALT):  13 (03/07/2010)   SGOT (AST):  21 (03/07/2010)   Allergies: 1)  ! Lisinopril  Past History:  Past medical, surgical, family and social histories (including risk factors) reviewed, and no changes noted (except as noted below).  Past Medical History: Reviewed history from 02/10/2010 and no changes required. OTHER MALAISE AND FATIGUE (ICD-780.79) VITAMIN B12 DEFICIENCY (ICD-266.2) ABDOMINAL PAIN RIGHT LOWER QUADRANT (ICD-789.03) HEADACHE (ICD-784.0) HEMORRHOIDS, INTERNAL (ICD-455.0) PLANTAR FASCIITIS (ICD-728.71) TEMPOROMANDIBULAR JOINT PAIN (ICD-524.62) Hx of GASTRITIS (ICD-535.50) ALLERGIC RHINITIS (ICD-477.9) GERD (ICD-530.81) HYPERTENSION (ICD-401.9) DIVERTICULITIS, HX OF (ICD-V12.79) SYMPTOM, PAIN, ABDOMINAL, RIGHT UP QUADRANT (ICD-789.01)  Past Surgical History: Reviewed history from 08/16/2007 and no changes required. sigmoid colectomy s/p c-section Tubal ligation Tonsillectomy  Family History: Reviewed history from 04/27/2009 and no changes required. father-deceased Jan 03, 1975 with MI mother - 91: HTN Neg - breast or colon cancer; DM Family History Hypertension - 1 brothers No FH of Colon Cancer:  Social History: Reviewed history from 03/24/2009 and no changes required. 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 Throat clear, w/o exudate, no LAD, R TM clear, L TM - good landmarks, No fluid present. rhinnorhea.  Left frontal and maxillary sinuses: Tender Right frontal and maxillary sinuses: Tender vertigo induced with head motion side to side  Neck: No ant or post LAD  CV: RRR, No M/G/R  Pulm: Breathing comfortably in no resp distress. no w/c/r  Abd: S,NT,ND,+BS  Extr: no  c/c/e  Psych: full affect, pleasant    Impression & Recommendations:  Problem # 1:  DYSEQUILIBRIUM (ICD-780.4) true vertigo. At this point, would still favor viral labyrinthitis, could be BPPV. If symptoms continue, we did discuss vestibular rehabilitation.  Her updated medication list for this problem includes:    Meclizine Hcl 12.5 Mg Tabs (Meclizine hcl) .Marland Kitchen... 1 by mouth q 6 as needed dizziness  Problem # 2:  SINUSITIS - ACUTE-NOS (ICD-461.9)  14 days, would suspect true sinusitis, will treat with high-dose amoxicillin dose and  Her updated medication list for this problem includes:    Amoxicillin 500 Mg Tabs (Amoxicillin) .Marland KitchenMarland KitchenMarland KitchenMarland Kitchen 3 by mouth two times a day  Complete Medication List: 1)  Benicar Hct 40-25 Mg Tabs (Olmesartan medoxomil-hctz) .... Take 1 tablet by mouth once a day 2)  Pristiq 50 Mg Xr24h-tab (Desvenlafaxine succinate) 3)  Trazodone Hcl 50 Mg Tabs (Trazodone hcl) 4)  Meclizine Hcl 12.5 Mg Tabs (Meclizine hcl) .Marland Kitchen.. 1 by mouth q 6 as needed dizziness 5)  Nexium 40 Mg Cpdr (Esomeprazole magnesium) .Marland Kitchen.. 1 by mouth  qam 6)  Amoxicillin 500 Mg Tabs (Amoxicillin) .... 3 by mouth two times a day 7)  Diazepam 2 Mg Tabs (Diazepam) .Marland Kitchen.. 1 by mouth three times a day as needed vertigo  Patient Instructions: 1)  SINUSITIS 2)  Sinuses are cavities in facial skeleton that drain to nose. Impaired drainage and obstruction of sinus passages main cause. 3)  Treatment: 4)  1. Take all Antibiotics 5)  2. Open nasal and sinus canals: Oral decongestant: Sudafed. (CAUTION IF HIGH BLOOD PRESSURE) 6)  3. Steam inhalation 7)  4. Humidifier in room 8)  5. Frequent nasal saline irrigation 9)  6. Moist heat compresses to face 10)  7. Tylenol or Ibuprofen for pain and fever, follow directions on bottle.  11)  FOLLOW-UP WITH DR. Arthur Holms IN A COUPLE OF WEEKS IF DIZZINESS NOT BETTER Prescriptions: DIAZEPAM 2 MG  TABS (DIAZEPAM) 1 by mouth three times a day as needed vertigo  #20 x 0   Entered  and Authorized by:   Hannah Beat MD   Signed by:   Hannah Beat MD on 03/19/2010   Method used:   Print then Give to Patient   RxID:   [id number] AMOXICILLIN 500 MG TABS (AMOXICILLIN) 3 by mouth two times a day  #60 x 0   Entered and Authorized by:   Hannah Beat MD   Signed by:   Hannah Beat MD on 03/19/2010   Method used:   Print then Give to Patient   RxID:   [id number]

## 2010-09-22 NOTE — Assessment & Plan Note (Signed)
Summary: blood in her stool/$50/pn   Vital Signs:  Patient Profile:   45 Years Old Female Height:     67 inches Temp:     98.7 degrees F oral Pulse rate:   68 / minute BP sitting:   118 / 78  (left arm) Cuff size:   regular  Vitals Entered By: Ok Anis                 PCP:  norins  Chief Complaint:  Blood in stool.  History of Present Illness: Patient reports that she had a lot of blood in the stool with BM. No spontaneous bleeding. Has mild rectal pain. some lower abdominal discomfort and low back pain. No history of hemorrhoids.     Updated Prior Medication List: BENICAR HCT 40-25 MG  TABS (OLMESARTAN MEDOXOMIL-HCTZ) Take 1 tablet by mouth once a day TYLENOL EXTRA STRENGTH 500 MG  TABS (ACETAMINOPHEN) as needed MYLANTA 200-200-20 MG/5ML SUSP (ALUM & MAG HYDROXIDE-SIMETH) as directed  Current Allergies: ! LISINOPRIL  Past Medical History:    Reviewed history from 03/11/2008 and no changes required:       obstipation       Hypertension  Past Surgical History:    Reviewed history from 08/16/2007 and no changes required:       sigmoid colectomy       s/p c-section       Tubal ligation       Tonsillectomy     Review of Systems  The patient denies anorexia, fever, abdominal pain, melena, hematochezia, severe indigestion/heartburn, and incontinence.     Physical Exam  General:     Well-developed,well-nourished,in no acute distress; alert,appropriate and cooperative throughout examination Rectal:     no external abnormalities, no hemorrhoids, and normal sphincter tone.  Anoscopy-guiaic negative mucus above scope; visualized internal hemorrhoid, not bleeding, small sized vessel    Impression & Recommendations:  Problem # 1:  HEMORRHOIDS, INTERNAL (ICD-455.0) Single episode of bleeding; internal hemorrhoid on exam.  Plan: con't MOM, add bulk laxative, don't strain at stool. for rectal discomfort Anusol HC 2.5% supp per rectum two times a day as  needed. Orders: Anoscopy (04540)   Complete Medication List: 1)  Benicar Hct 40-25 Mg Tabs (Olmesartan medoxomil-hctz) .... Take 1 tablet by mouth once a day 2)  Tylenol Extra Strength 500 Mg Tabs (Acetaminophen) .... As needed 3)  Mylanta 200-200-20 Mg/41ml Susp (Alum & mag hydroxide-simeth) .... As directed    ]

## 2010-09-22 NOTE — Miscellaneous (Signed)
Summary: Orders Update   Clinical Lists Changes  Orders: Added new Referral order of Radiology Referral (Radiology) - Signed 

## 2010-09-22 NOTE — Progress Notes (Signed)
Summary: REFERRAL  Phone Note Call from Patient   Caller: [phone] Summary of Call: Patient is requesting referal to a podatrist for her heel. She says that she has been given an injection in the past but it is still bothering her. She does not want to come into the office to be seen again, only wants the referral. Pt aware Dr will not be back until Thursday. Initial call taken by: Lamar Sprinkles,  October 07, 2007 1:57 PM  Follow-up for Phone Call        OK for referral to [facility]. [facility] notified. Follow-up by: Jacques Navy MD,  October 08, 2007 3:38 PM  New Problems: PLANTAR FASCIITIS (ICD-728.71)   New Problems: PLANTAR FASCIITIS (ICD-728.71)

## 2010-09-22 NOTE — Progress Notes (Signed)
Summary: Refill request  Phone Note Refill Request   Refills Requested: Medication #1:  Lisinopril-HCTZ 20/25mg  tab take 1 tab by mouth every day   Last Refilled: 02/04/2008   Notes: #30 Not on pt med list please advise.  Initial call taken by: Zackery Barefoot CMA,  March 11, 2008 10:18 AM  Follow-up for Phone Call        ok for as needed refills Follow-up by: Jacques Navy MD,  March 11, 2008 10:42 AM      Prescriptions: BENICAR HCT 40-25 MG  TABS (OLMESARTAN MEDOXOMIL-HCTZ) Take 1 tablet by mouth once a day  #30 x 5   Entered by:   Zackery Barefoot CMA   Authorized by:   Jacques Navy MD   Signed by:   Zackery Barefoot CMA on 03/11/2008   Method used:   Electronically sent to ...       [facility] [address]. [phone]*       [address]       [place], [place]  [address]       Ph: [phone]       Fax: [phone]   RxID:   [id number]

## 2010-09-22 NOTE — Letter (Signed)
Summary: [facility] Consult Scheduled Letter  [facility]  [address] [place], [place] [address]   Phone: [phone]  Fax: [phone]      03/24/2009 MRN: [id number]  [facility] [address] [place], [place]  [address]    Dear [name],      We have scheduled an appointment for you.  At the recommendation of Dr.Norins, we have scheduled you a consult with Dr Maple Hudson on 04/20/09 at 1:15pm.  Their phone number is [phone].  If this appointment day and time is not convenient for you, please feel free to call the office of the doctor you are being referred to at the number listed above and reschedule the appointment.     [facility] [address] [place], [place] [address] *Pulmonary Dept.2nd Floor*   Please give 24hr notice if you need to cancel/reschedule to avoid a $50.00 fee.Also bring insurance card and any co-pay due at time of visit.    Thank you,  Patient Care Coordinator [facility]

## 2010-09-22 NOTE — Procedures (Signed)
Summary: EGD and Path  Gastroenterology EGD   Imported By: Darcey Nora RN 09/05/2007 08:43:50  _____________________________________________________________________  External Attachment:    Type:   Image     Comment:   External Document  Appended Document: Gastroenterology EGD SP Surgical Pathology - STATUS: Final             By: Gwenlyn Saran  ,        Perform Date: [id number] 00:01  Ordered By: Talbert Forest Date: 20Nov06 13:06  Facility: [facility]                               Department: CPATH  Service Report Text  [facility]   [address] [address]   [place], [place] [address]   Telephone [phone] or [phone] Fax [phone]    REPORT OF SURGICAL PATHOLOGY    Case #: [id number]   Patient Name: [name] [name], [name] [name].   PID: [id number]   Pathologist: Berneta Levins, MD   DOB/Age 04-25-1966 (Age: 45) Gender: F   Date Taken: 07/07/2005   Date Received: 07/10/2005    FINAL DIAGNOSIS    ***MICROSCOPIC EXAMINATION AND DIAGNOSIS***    1. STOMACH: CHRONIC GASTRITIS WITH HELICOBACTER PYLORI. NO   INTESTINAL METAPLASIA, DYSPLASIA OR MALIGNANCY IDENTIFIED.   (BIOPSY)    2. ESOPHAGUS: FINDINGS CONSISTENT WITH GASTROESOPHAGEAL REFLUX.   NO INTESTINAL METAPLASIA, DYSPLASIA OR MALIGNANCY IDENTIFIED.   (BIOPSIES, DISTAL)    COMMENT   1. A Warthin-Starry stain is performed to determine the   possibility of the presence of Helicobacter pylori. Organisms of   Helicobacter pylori are identified on the Warthin-Starry stain.   The control(s) stained appropriately. There is a fragment of   benign fundic-type mucosa as well as a fragment of   cardia/antral-type mucosa, both of which show chronic   inflammation within the lamina propria consistent with chronic   gastritis. There is a lymphoid aggregate noted. No intestinal   metaplasia, atypia, or evidence of malignancy is seen.    2. An Alcian Blue stain is performed to determine the  presence of   intestinal metaplasia (goblet cell metaplasia). No intestinal   metaplasia (goblet cell metaplasia) is identified with the Alcian   Blue stain. The control stained appropriately. There is   gastroesophageal junction-type mucosa in which there is a rare   eosinophil noted within the squamous epithelium. Mild   inflammation is noted within the lamina propria. Overall, the   findings can be seen in gastroesophageal reflux disease in the   appropriate clinical setting. Clinical correlation is   recommended. There is no evidence of malignancy. (JAS:gt)   07/11/05    gdt   Date Reported: 07/11/2005 Berneta Levins, MD   *** Electronically Signed Out By JAS ***    Clinical information   Epigastric. 1. R/O H. pylori 2. Barrett' s (jy)    specimen(s) obtained   1: Stomach, biopsy   2: Esophagus, biopsy, distal    Gross Description   1. Received in formalin are tan, soft tissue fragments that are   submitted in toto. Number: 2   Size: 0.2 and 0.3 cm    2. Received in formalin are tan, soft tissue fragments that are   submitted in toto. Number: multiple   Size: 0.2 to 0.4 cm (GRP:kcv 07-10-05)    kv/

## 2010-09-22 NOTE — Assessment & Plan Note (Signed)
Summary: SINUS INFECTION/ $50 /NWS   Vital Signs:  Patient profile:   45 year old female Height:      67 inches Weight:      224.25 pounds BMI:     35.25 O2 Sat:      99 % on Room air Temp:     98.6 degrees F oral Pulse rate:   76 / minute BP sitting:   140 / 84  (left arm) Cuff size:   regular  Vitals Entered By: Josph Macho - student  O2 Flow:  Room air CC: pt believes she has sinus infection/ wants eyes checked Is Patient Diabetic? No Pain Assessment Patient in pain? no        Primary Care Provider:  norins  CC:  pt believes she has sinus infection/ wants eyes checked.  History of Present Illness: Patient with a one week h/o periorbital swelling, itchy watery eyes and burning. she is having a several frontal headache. Post-nasal drainage of a thick yellow mucus. The constant drainage is making her nauseated with emesis.  She has been taking allegra andusing steroid eye drops but her symptoms persist. She had been seen in the ED about 4 months ago in [facility]. She was running a fever but not today. She does have a history of seasonal allergy but has never been tested.   Current Medications (verified): 1)  Benicar Hct 40-25 Mg  Tabs (Olmesartan Medoxomil-Hctz) .... Take 1 Tablet By Mouth Once A Day 2)  Darvocet A500 100-500 Mg Tabs (Propoxyphene N-Apap) .Marland Kitchen.. 1 Q4h As Needed Headache 3)  Promethazine Hcl 12.5 Mg Tabs (Promethazine Hcl) .Marland Kitchen.. 1-2 Q4h As Needed Nausea  Allergies (verified): 1)  ! Lisinopril  Past History:  Past Medical History: Last updated: 03/11/2008 obstipation Hypertension  Past Surgical History: Last updated: 08/16/2007 sigmoid colectomy s/p c-section Tubal ligation Tonsillectomy  Family History: Last updated: Mar 29, 2009 father-deceased 01-05-1975 with MI mother - 51: HTN Neg - breast or colon cancer; DM Family History Hypertension - 1 brothers  Social History: Last updated: March 29, 2009 [place], [place] - [place] business married '00 2  sons - 01-05-1980, 1986/01/04; 1 duaghter 1979/01/05 work: mfg scheduling in a print business [place] Never Smoked Alcohol use-no  Family History: father-deceased Jan 05, 1975 with MI mother - 1945: HTN Neg - breast or colon cancer; DM Family History Hypertension - 1 brothers  Social History: HSG, [place] - [employer] business married '00 2 sons - '81, '87; 1 duaghter 01/05/79 work: mfg scheduling in a print business [place] Never Smoked Alcohol use-no  Review of Systems       The patient complains of fever, prolonged cough, and headaches.  The patient denies anorexia, weight loss, weight gain, decreased hearing, chest pain, dyspnea on exertion, abdominal pain, and incontinence.         shortness of breath in the heat -code orange days  Physical Exam  General:  WNWD AA female in no acute distress Head:  normocephalic, atraumatic, and no abnormalities observed.  Tender to percussion over frontal and more so the maxillary sinus Eyes:  C&S with mild injection; pupils normal Ears:  EACs and TMs normal Mouth:  throat is clear Neck:  supple and full ROM.   Lungs:  normal respiratory effort, normal breath sounds, no crackles, and no wheezes.   Heart:  normal rate.   Skin:  turgor normal, color normal, and no rashes.     Impression & Recommendations:  Problem # 1:  ALLERGIC RHINITIS (ICD-477.9)  flare  of allergic rhinnitis with secondary infection.  Plan: loratadine 10mg  once daily         sudafed 30mg  three times a day         prednisone: 40mg  x 1, 30mg  once daily x 3, 20mg  once daily x 3, 10mg  once daily x 6         Augmetin 875mg  two times a day x 7 days         refer to Dr. Maple Hudson for allergy evaluation   Her updated medication list for this problem includes:    Promethazine Hcl 12.5 Mg Tabs (Promethazine hcl) .Marland Kitchen... 1-2 q4h as needed nausea  Orders: Allergy Referral  (Allergy)  Complete Medication List: 1)  Benicar Hct 40-25 Mg Tabs (Olmesartan medoxomil-hctz) .... Take 1 tablet by mouth once a  day 2)  Darvocet A500 100-500 Mg Tabs (Propoxyphene n-apap) .Marland Kitchen.. 1 q4h as needed headache 3)  Promethazine Hcl 12.5 Mg Tabs (Promethazine hcl) .Marland Kitchen.. 1-2 q4h as needed nausea 4)  Amoxicillin-pot Clavulanate 875-125 Mg Tabs (Amoxicillin-pot clavulanate) .Marland Kitchen.. 1 by mouth two times a day x 7 5)  Prednisone 10 Mg Tabs (Prednisone) .... 4 tabs x 1, 3 tabs once daily x 3, 2 tabs once daily x 3, 1 tab once daily x 6  Patient Instructions: 1)  allergic rhinnitis flare with secondary sinus infection- plan: loratadine 10mg  once day and take this long term; sudafed 30mg  three times a day for 7 days (otc - behind the coutner); prednisone for 13 days as instructed; augmentin two times a day x 7 days - antibiotic. Call if eye discomfort does not respond to treatment. Lastly, referral to Dr. Maple Hudson for allergy evaluation.  Prescriptions: PREDNISONE 10 MG TABS (PREDNISONE) 4 tabs x 1, 3 tabs once daily x 3, 2 tabs once daily x 3, 1 tab once daily x 6  #25 x 0   Entered and Authorized by:   Jacques Navy MD   Signed by:   Jacques Navy MD on 03/24/2009   Method used:   Electronically to        [facility]. [phone]* (retail)       [address]       [place], [place]  [address]       Ph: [phone]       Fax: [phone]   RxID:   [id number] AMOXICILLIN-POT CLAVULANATE 875-125 MG TABS (AMOXICILLIN-POT CLAVULANATE) 1 by mouth two times a day x 7  #14 x 0   Entered and Authorized by:   Jacques Navy MD   Signed by:   Jacques Navy MD on 03/24/2009   Method used:   Electronically to        [facility]. [phone]* (retail)       [address]       [place], [place]  [address]       Ph: [phone]       Fax: [phone]   RxID:   [id number]

## 2010-09-22 NOTE — Assessment & Plan Note (Signed)
Summary: HEADACHE SINCE FRIDAY / NAUSEA/ LIGHTHEADED /NO FEVER /NWS   Vital Signs:  Patient Profile:   45 Years Old Female Height:     67 inches Weight:      229.6 pounds O2 Sat:      98 % O2 treatment:    Room Air Temp:     98.5 degrees F oral Pulse rate:   61 / minute BP sitting:   112 / 88  (left arm) Cuff size:   large  Pt. in pain?   no  Vitals Entered By: Orlan Leavens (October 12, 2008 11:05 AM)                  PCP:  norins  Chief Complaint:  headache/ dizziness over the weekend.  History of Present Illness: 3 days of severe headache at entire right side of the head, and associated nausea.  denies sore throat and earache.    Prior Medications Reviewed Using: Patient Recall  Prior Medication List:  BENICAR HCT 40-25 MG  TABS (OLMESARTAN MEDOXOMIL-HCTZ) Take 1 tablet by mouth once a day TYLENOL EXTRA STRENGTH 500 MG  TABS (ACETAMINOPHEN) as needed MYLANTA 200-200-20 MG/5ML SUSP (ALUM & MAG HYDROXIDE-SIMETH) as directed   Updated Prior Medication List: BENICAR HCT 40-25 MG  TABS (OLMESARTAN MEDOXOMIL-HCTZ) Take 1 tablet by mouth once a day TYLENOL EXTRA STRENGTH 500 MG  TABS (ACETAMINOPHEN) as needed MYLANTA 200-200-20 MG/5ML SUSP (ALUM & MAG HYDROXIDE-SIMETH) as directed  Current Allergies (reviewed today): ! LISINOPRIL  Past Medical History:    Reviewed history from 03/11/2008 and no changes required:       obstipation       Hypertension     Review of Systems  The patient denies fever and syncope.         sees "black spots."   Physical Exam  General:     normal appearance.   Head:     head: no deformity eyes: no periorbital swelling, no proptosis external nose and ears are normal mouth: no lesion seen  Neck:     supple Msk:     gait is normal and steady    Impression & Recommendations:  Problem # 1:  HEADACHE (ICD-784.0) uncertain etiology   Medications Added to Medication List This Visit: 1)  Darvocet A500 100-500 Mg Tabs  (Propoxyphene n-apap) .Marland Kitchen.. 1 q4h as needed headache 2)  Promethazine Hcl 12.5 Mg Tabs (Promethazine hcl) .Marland Kitchen.. 1-2 q4h as needed nausea   Patient Instructions: 1)  ref neurol 2)  darvocet 3)  phenergan   Prescriptions: PROMETHAZINE HCL 12.5 MG TABS (PROMETHAZINE HCL) 1-2 q4h as needed nausea  #50 x 1   Entered and Authorized by:   Minus Breeding MD   Signed by:   Minus Breeding MD on 10/12/2008   Method used:   Print then Give to Patient   RxID:   [id number] DARVOCET A500 100-500 MG TABS (PROPOXYPHENE N-APAP) 1 q4h as needed headache  #50 x 1   Entered and Authorized by:   Minus Breeding MD   Signed by:   Minus Breeding MD on 10/12/2008   Method used:   Print then Give to Patient   RxID:   [id number]

## 2010-09-22 NOTE — Letter (Signed)
Summary: [facility] Consult Scheduled Letter  [facility]  [address] [place], [place] [address]   Phone: [phone]  Fax: [phone]      10/08/2007 MRN: [id number]  [facility] [address] [place], [place]  [address]    Dear [name],      We have scheduled an appointment for you.  At the recommendation of Dr. Debby Bud, we have scheduled you a consult with Dr. Al Corpus on October 22, 2007  at 3:30pm.  Their phone number is [phone].  If this appointment day and time is not convenient for you, please feel free to call the office of the doctor you are being referred to at the number listed above and reschedule the appointment.  Dr. [facility] [address] [place], [place] [address]  *Please arrive 15 minutes prior to your appointment*  Thank you,  Patient Care Coordinator [facility]

## 2010-09-22 NOTE — Procedures (Signed)
Summary: Gastroenterology Colon  Gastroenterology Colon   Imported By: Darcey Nora RN 09/05/2007 08:42:59  _____________________________________________________________________  External Attachment:    Type:   Image     Comment:   External Document

## 2010-09-22 NOTE — Assessment & Plan Note (Signed)
Summary: not better from last ov/$50/pn   Vital Signs:  Patient Profile:   45 Years Old Female Height:     67 inches Temp:     98.5 degrees F oral Pulse rate:   88 / minute BP sitting:   125 / 83  (left arm) Cuff size:   large  Vitals Entered By: Zackery Barefoot CMA (March 20, 2008 9:57 AM)                 PCP:  Tramaine Snell  Chief Complaint:  Congestion.  History of Present Illness: Seen July 22nd for angioedema thought to be due to ACE-I. She is still completing a course of prednisone. The swelling has resolved but she is having sweats, some sleep interference. SHe reports congestion and c/o difficulty breathing when out in the heat. She has a cough productive of a yellow mucus. No documented fever.    Updated Prior Medication List: BENICAR HCT 40-25 MG  TABS (OLMESARTAN MEDOXOMIL-HCTZ) Take 1 tablet by mouth once a day TYLENOL EXTRA STRENGTH 500 MG  TABS (ACETAMINOPHEN) as needed PREDNISONE 10 MG  TABS (PREDNISONE) 6 tabs day #1, 4 tabs once daily x 3, 3 tabs once daily x 3, 20mg  once daily x 3, 1 tab once daily x 6.  Current Allergies: ! LISINOPRIL  Past Medical History:    Reviewed history from 03/11/2008 and no changes required:       obstipation       Hypertension  Past Surgical History:    Reviewed history from 08/16/2007 and no changes required:       sigmoid colectomy       s/p c-section       Tubal ligation       Tonsillectomy     Review of Systems       The patient complains of dyspnea on exertion and peripheral edema.  The patient denies anorexia, fever, weight loss, weight gain, hoarseness, chest pain, abdominal pain, hematochezia, muscle weakness, and depression.     Physical Exam  General:     alert, well-developed, well-nourished, and well-hydrated.   Head:     normocephalic and atraumatic.  No tenderness over the sinuses  Eyes:     C&S clear Ears:     TMs normal Mouth:     Throat clear Neck:     No deformities, masses, or tenderness  noted. Lungs:     normal respiratory effort, normal breath sounds, and no wheezes.   Heart:     normal rate and regular rhythm.      Impression & Recommendations:  Problem # 1:  ALLERGIC RHINITIS (ICD-477.9) No evidence of bacterial infection. No indication for antibiotics.  Problem # 2:  ADVERSE DRUG REACTION (ICD-995.20) Assessment: New Angioedema has resolved. Having some side affects from steroids.  Plan: complete steroid dosing and OK to use diphenhydramine instead of loratadine.  Problem # 3:  HYPERTENSION (ICD-401.9)  Her updated medication list for this problem includes:    Benicar Hct 40-25 Mg Tabs (Olmesartan medoxomil-hctz) .Marland Kitchen... Take 1 tablet by mouth once a day  BP today: 125/83 Prior BP: 141/85 (03/11/2008)  Labs Reviewed: Creat: 0.9 (07/26/2007)  Good control. Plan continue benicar.   Complete Medication List: 1)  Benicar Hct 40-25 Mg Tabs (Olmesartan medoxomil-hctz) .... Take 1 tablet by mouth once a day 2)  Tylenol Extra Strength 500 Mg Tabs (Acetaminophen) .... As needed 3)  Prednisone 10 Mg Tabs (Prednisone) .... 6 tabs day #1, 4 tabs once daily x  3, 3 tabs once daily x 3, 20mg  once daily x 3, 1 tab once daily x 6.    ]

## 2010-11-09 LAB — URINALYSIS, ROUTINE W REFLEX MICROSCOPIC
Bilirubin Urine: NEGATIVE
Glucose, UA: NEGATIVE mg/dL
Hgb urine dipstick: NEGATIVE
Ketones, ur: NEGATIVE mg/dL
Nitrite: NEGATIVE
Protein, ur: NEGATIVE mg/dL
Specific Gravity, Urine: 1.018 (ref 1.005–1.030)
Urobilinogen, UA: 0.2 mg/dL (ref 0.0–1.0)
pH: 6 (ref 5.0–8.0)

## 2011-01-03 NOTE — Assessment & Plan Note (Signed)
 HEALTHCARE                         GASTROENTEROLOGY OFFICE NOTE   NAME:[name], [name] BAIK                     MRN:          [id number]  DATE:07/26/2007                            DOB:          1965/08/26    [name] [name] continues to have rather severe obstipation and left lower  quadrant radiating to her left back. This has been going on for several  months and is associated with rather severe inability to have a bowel  movement. It is of note that the patient had sigmoid colectomy November  2007 because of recurrent diverticulitis. This was performed by Dr. Wenda [name]. This really has not helped any of her GI symptoms.   Because of her pain and obstipation, she has been seen in primary care  and had an acute abdominal series on April 03, 2007 which was  unremarkable. Abdominal ultrasound on May 17, 2007 was normal, and  CT scan of the abdomen was performed on June 17, 2007 also which was  entirely unremarkable. She has had treatment with lactulose, MiraLax and  other laxatives without improvement. She said she has had recent  gynecologic consult and is having no GYN problems. She denies nausea and  vomiting, fever, chills or other systemic complaints.  She also denies  rectal bleeding or any genitourinary problems. She denies upper GI or  hepatobiliary difficulties.   She currently is on Benicar hydrochlorothiazide 40/25, 30 mL of Enulose  syrup a day, something called Colon Health daily. She suffers from  essential hypertension and is followed by Dr. Debby [name].   Previous notes show that the patient was felt to have colonic inertia  and has really had no response to any medical treatment in the past for  constipation. She did do well partially after her limited colectomy by  Dr. Daphine [name].   She is healthy appearing black female in no distress. She weighs 237  pounds which is her normal weight and blood pressure 120/84 and pulse  was 64 and  regular.  Her abdomen was flat and nontender without organomegaly, masses,  tenderness or distention. Bowel sounds were diminished.  Inspection of the rectum was unremarkable as was rectal exam without  evidence of a fecal impaction. There was hard stool present that was  guaiac negative.  Examination of the back and CVA areas were normal.   ASSESSMENT:  Ms. [name] [name] has severe obstipation with associated lower  abdominal pain and as per my previous notes probably has colonic  inertia.   RECOMMENDATIONS:  1. Outpatient colonoscopy as soon as possible with double prep over      the weekend.  2. Repeat lab data.  3. Amitiza 8 mcg twice a day with above-mentioned bowel prep.  4. Consider Sitz marker studies.  5. Continue other medications as per Dr. Debby [name].     Vania Rea. [name] Motto, MD, Caleen Essex, FAGA  Electronically Signed    DRP/MedQ  DD: 07/26/2007  DT: 07/26/2007  Job #: [id number]

## 2011-01-06 NOTE — Assessment & Plan Note (Signed)
[facility]                           PRIMARY CARE OFFICE NOTE   NAME:[name] [name], [name] [name]                     MRN:          [id number]  DATE:10/15/2006                            DOB:          02-15-1966    [name] [name] is a 45 year old African American women who presents to  establish for ongoing continuity care.   CHIEF COMPLAINT:  The patient reports she has had a headache for 10  days, which she describes at a fronto-occipital distribution.  She  describes it as crushing, constant, with no visual change, some nausea,  no other focal neurologic symptoms.  She also reports that she is very  aware of her heart sounds in her left ear.   PAST MEDICAL HISTORY:  SURGICAL:  1. Tonsillectomy, remote.  2. Caesarean section with BTL with her third pregnancy.  3. Laparoscopic colectomy for diverticulitis November 2007.   GYN:  The patient is a gravida 3, para 3 with last delivery by caesarean  section.   MEDICAL:  The patient had the usual childhood diseases.  She has history  of hypertension, history of diverticulosis with diverticulitis, history  of allergic rhinitis.   PHYSICIAN ROSTER:  Dr. Luretha Murphy for general surgery, Dr. Sheryn Bison for GI, Dr. Estanislado Pandy for GYN.   CHART REVIEW:  Last upper endoscopy July 07, 2005, with gastritis.  Last colonoscopy was September of 2005.  Last correspondence is from  Wenda Low dated May 24, 2006, with subsequent surgery dated  June 27, 2006.   Last laboratory from May 04, 2006, ordered by Dr. Jarold Motto, with  hemoglobin 11.8 grams, white count was normal.  The patient did have CLO  study, which was positive for Helicobacter pylori, July 07, 2005.  The patient had chemistries July 04, 2005, which were normal with a  glucose of 95, liver functions were normal, thyroid functions were  normal with a free T4 of 0.9 and TSH of 1.39.   CT scan April 11, 2006, with diverticulitis of  descending proximal  colon.  Ultrasound June 27, 2005, with normal abdominal ultrasound  without evidence of cholelithiasis.   FAMILY HISTORY:  Mother is 33 and in good health, father died in his  23s, having had MI on the operating table.  The patient has 3 brothers  in good health except for hypertension.   SOCIAL HISTORY:  The patient was married for approximately 3 years, then  was single and a single mother for several years, and then married 5  years ago.  She has a daughter 16, 2 sons aged 33 and 19.  She has 2  grandsons.  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   CURRENT MEDICATIONS:  Benicar HCT 40/25 once daily.   REVIEW OF SYSTEMS:  Negative for any constitutional problems.  She has  had an eye exam in the last 12 months.  No ENT, cardiovascular,  respiratory, GI, GU or musculoskeletal complaints.   LIMITED EXAM:  Temperature 100, blood pressure 151/98, pulse 91, weight  244.  GENERAL APPEARANCE:  This is a heavy set African-American women in  no  acute distress.  HEENT:  Normocephalic, atraumatic and unremarkable.  Pupils equal, round  and reactive to light and accommodation.  Funduscopic exam deferred.  NECK:  Supple with full range of motion.   ASSESSMENT/PLAN:  1. Hypertension.  The patient's chart was reviewed and she is      generally well controlled.  I suspect her elevated blood pressure      today is due to headache.  Plan:  The patient to continue her      present medications and she will need to have followup blood      pressure check at her convenience.  2. Headache.  The patient with a tension-type headache.  She is given      ketorolac 30 mg IM.  She is advised that if her headache returns,      that she can use over-the-counter Aleve, taking 1 or 2 tablets      b.i.d.  3. Gastrointestinal.  The patient has made a good recovery from her      diverticulitis with colectomy and reports that she has a normal      bowel habit and good  appetite.   HEALTH MAINTENANCE:  The patient's last mammogram in July of 2007 was  normal.  She said her last pelvic and Pap smear was March of 2007 and  evidentially was stable and normal.  The patient had recent laboratories  as noted.   In summary, a pleasant women with muscle tension headaches, who  presented to establish for ongoing care.  She is asked to return to see  me on a p.r.n. basis.     Rosalyn Gess Norins, MD  Electronically Signed    MEN/MedQ  DD: 10/15/2006  DT: 10/16/2006  Job #: [id number]   cc:   Lenis Dickinson Rivard, M.D.

## 2011-01-06 NOTE — Assessment & Plan Note (Signed)
[facility]                           GASTROENTEROLOGY OFFICE NOTE   NAME:Kosh, JACLIN FINKS                     MRN:          [id number]  DATE:04/06/2006                            DOB:          1966-01-29    Lizann has intermittent, rather severe sharp right lower quadrant pain  that is almost in her pelvic area.  It occurs every other day, lasts 20  minutes in duration, and has no real precipitating or alleviating effects.  She continues to be severely constipated.  She has had OB/GYN exam and an  ultrasound which is normal.  I previously did colonoscopy in September 2005  which was unremarkable except for some diverticulosis.  She also had  endoscopy and was treated for Helicobacter pylori gastritis.  Upper  abdominal ultrasound was negative.  She has had no anorexia, weight loss or  systemic complaints.  Her bowels are tight but she does not have melena or  hematochezia.   PHYSICAL EXAMINATION:  VITAL SIGNS:  Weight 249 pounds, which is up some 15  pounds from her normal weight.  Blood pressure 122/78, pulse was 80 and  regular.  ABDOMEN:  Somewhat difficult but I could appreciate no definite  organomegaly, masses, and she seemed to be tender really over her femoral  area in the right groin area.  Bowel sounds were hypoactive.   ASSESSMENT:  1. Probable colonic inertia with severe chronic constipation.  2. Right lower quadrant pain, which I think is probably structural in      nature and perhaps related to a femoral hernia.  3. Status post treatment for Helicobacter pylori gastritis.  4. Morbid obesity.   RECOMMENDATIONS:  1. Check abdominal-pelvic CT scan.  2. Consider referral to [facility] B. Daphine Deutscher, MD, for surgical exam.  3. Trial of Amitiza 24 mcg twice a day with meals.  4. GI follow-up in several weeks' time.                                   Vania Rea. Jarold Motto, MD, Clementeen Graham, [place]   DRP/MedQ  DD:  04/06/2006  DT:  04/06/2006  Job #:  [id number]   cc:   [place] Daphine Deutscher, MD  Tracey Harries, MD

## 2011-01-06 NOTE — Op Note (Signed)
NAMECHASITI, WADDINGTON              ACCOUNT NO.:  [id number]   MEDICAL RECORD NO.:  [id number]          PATIENT TYPE:  INP   LOCATION:  1619                         FACILITY:  [facility]   PHYSICIAN:  Thornton Park. Daphine Deutscher, MD  DATE OF BIRTH:  [birth year]   DATE OF PROCEDURE:  06/27/2006  DATE OF DISCHARGE:                                 OPERATIVE REPORT   CCS number is [id number].   PREOPERATIVE INDICATIONS:  [name] [name] is a 45 year old woman sent by Dr.  Jarold Motto for recurrent pain.  She has been treated on and off with Cipro  and Flagyl and found to have some dense diverticulosis in her sigmoid colon.  She had had some problems with constipation and because she had pain that  there were unable to control, she was referred for surgical management.  Informed consent was obtained regarding limitations of the procedure and  preoperatively I obtained a single contrast barium enema which that and the  CT scan I used intraoperatively and management of this.   SURGEON:  Thornton Park. Daphine Deutscher, M.D.   ASSISTANT:  Ardeth Sportsman, MD.   ANESTHESIA:  General.   ESTIMATED BLOOD LOSS:  Minimal.   PROCEDURE:  Laparoscopically-assisted sigmoid colectomy.   DESCRIPTION OF PROCEDURE:  Ms. Isip was taken to room #1 and was prepped  with Techni-Care after general anesthesia was administered.  I entered the  abdomen through the right upper quadrant using a 5 mm 0 degree OptiVu  technique without difficulty and inflated the abdomen.  Two other 5 mm were  used in the lower portion of the right side and I then began mobilizing the  sigmoid.  Using the barium enema, it appeared that the mid portion of the  sigmoid colon going up toward the proximal sigmoid had been inflamed and  kind of strictured but there was not a lot of diverticula pouching out of  it.  I found in the area in question a lot of external adhesions to the left  ovary and we took those down with the harmonic scalpel.  Everything seemed  to  very stuck anteriorly and raised a question of whether it could have been  some form of pelvic inflammatory disease at some point contributing to the  density of these adhesions.  However, I was able to mobilize the left ovary  and tube and free it from the sigmoid colon and free the sigmoid colon and  bring it toward the midline.  I then went up the descending colon by  incising the white line up toward the spleen and significantly mobilized the  descending colon up to where it would come toward the midline.   I then went back and looked at the area that appeared inflamed and felt that  this would be best reached through a transverse incision just below the  umbilicus over in the left lower quadrant.  At that point, we left the  trocars in place and made a transverse incision and put in the hand assist  lap wound protector.  Through this, I was able to pull out the sigmoid colon  which was well mobilized.  This area appeared thickened and it had all the  external inflammatory changes were it had been stuck to the ovary.  I went  ahead and elected to resect everything I could get proximally and distally  and enabled me to really straighten out the sigmoid and to provide a tension-  free anastomosis.  I therefore chose site down distally again where the  bowel was very soft and non-thickened and there was no evidence of  inflammatory change as well as a similar area proximally and divided the  bowel with a contour stapler.  I then went through the mesentery with the  LigaSure with ease staying away from the ureters and staying more in the  mesentery.  The specimen was then opened and sent for fixation.  It appeared  thickened, particularly in the mesentery of this area.   We then performed a end-to-end anastomosis first cleaning off the proximal  and distal ends.  The bowel was opened and I inspected the inside, it was  nice, soft, smooth mucosa on either end.  Back row of 3-0 silk was  placed.  A running locking suture of 4-0 PDS used in the inner layer, carried  anteriorly in a canal Mayo fashion.  Anterior portion of interrupted 3-0  silks were used.  This completed a nice anastomosis which was very patent to  finger.  There was no really mesenteric defect to close.  I irrigated  copiously and we felt and again looked at it.  I clamped off the lap port  and then reinsufflated and looked at the area and it appeared that the area  in question that had been stuck up so densely to the left ovary in that area  had all been resected.  A nice looking anastomosis was present.   I then closed the transverse incision in two layers with #1 PDS, irrigating  after each time, changing our gloves and infiltrating it with some Marcaine.  Wounds were then closed with staples.  Sponge and needle counts reported as  correct.  The patient was taken to the recovery room in satisfactory  condition.      Thornton Park Daphine Deutscher, MD  Electronically Signed     MBM/MEDQ  D:  06/27/2006  T:  06/27/2006  Job:  [id number]   cc:   Tracey Harries, M.D.  Fax: [phone]   Vania Rea. Jarold Motto, MD, FACG, FACP, FAGA  [address]  [place]  [place] [address]

## 2011-01-06 NOTE — Assessment & Plan Note (Signed)
[facility]                           GASTROENTEROLOGY OFFICE NOTE   NAME:[name], SYDNE KRAHL                     MRN:          [id number]  DATE:05/04/2006                            DOB:          1966/03/26    [name] [name] developed diverticulitis and has responded to Cipro and  metronidazole for 2 weeks but now comes back to the office complaining of a  week of vague right and left lower quadrant discomfort without fever,  chills, or systemic complaints.   She initially had a CT scan as an outpatient on April 09, 2006, that was  unremarkable.  She presented to the emergency room on August 23 and had a  repeat scan that showed some probable diverticulitis in the left lower  quadrant.   On reviewing her chart, she has had problems with recurrent lower abdominal  pain for many years.  Somewhat atypical, is that the pain frequently  involves the right lower quadrant.   She weighs 244 pounds, and blood pressure is 124/84.  Abdominal exam is  unremarkable except for obesity and some slight tenderness to the left lower  quadrant.   ASSESSMENT:  Her previous colonoscopy showed rather severe dense  diverticulosis of the sigmoid colon.  I suspect she still has some subacute  inflammation.  Because of the recurrent nature of her problems, I think at  her young age, I think she is a good candidate for sigmoid resection, which  would seem appropriate at this time.   RECOMMENDATIONS:  1. Cipro 500 mg twice a day for the next 10 days.  2. Pamine Forte 5 mg every 12 hours as needed.  3. Darvocet-N 100 p.r.n. every 6 hours as needed.  4. Repeat CBC and sedimentation rate.  5. Surgical referral to [facility]. Daphine Deutscher, MD.  6. Diverticulosis patient education movie.                                   Vania Rea. Jarold Motto, MD, Clementeen [place], [place]   DRP/MedQ  DD:  05/04/2006  DT:  05/05/2006  Job #:  [id number]   cc:   Thornton Park Daphine Deutscher, MD

## 2011-01-06 NOTE — Discharge Summary (Signed)
[name] [name], [name] [name] NO.:  [id number]   MEDICAL RECORD NO.:  [id number]          PATIENT TYPE:  INP   LOCATION:  1619                         FACILITY:  [facility]   PHYSICIAN:  Ardeth Sportsman, MD     DATE OF BIRTH:  Dec 01, 1965   DATE OF ADMISSION:  06/27/2006  DATE OF DISCHARGE:  07/02/2006                                 DISCHARGE SUMMARY   PRIMARY CARE PHYSICIAN:  Tracey Harries.  Dr. Sheryn Bison.   DIAGNOSES:  1. Recurrent diverticulitis.  2. Hypertension.   PROCEDURE PERFORMED:  Laparoscopically-assisted sigmoid colectomy,  06/27/2006.   SUMMARY OF HOSPITAL COURSE:  [name] is a 45 year old female seen by Dr.  Jarold Motto with recurrent episodes of diverticulitis.  Recommendation was  made for resection.  She underwent this without difficulty on 06/27/2006.  Postoperatively, she did have some incidences of some hematochezia.  She was  able to increase her activity and therefore her DVT risk was lowered to the  point that she not require any subcutaneous heparin.  Once that was stopped,  she did not have any more bleeding episodes.  Her hemoglobin stayed above  10.  She was advanced and was able to tolerate liquids without any  difficulty with no nausea or vomiting.  Some mild back soreness, but that  was easily manageable.   DISCHARGE INSTRUCTIONS:  Patient to return to clinic to see Dr. Daphine Deutscher in 10  to 14 days for postoperative followup.   Follow up on pathology, pending at the time of dictation.   She should continue her Benicar on a daily basis as well as have Vicodin  5/500, one to two p.o. q.4h. p.r.n. pain, #50.  She can take Tylenol and ibuprofen as needed for pain as well as warm  showers and heating pads.   She should walk at least 30 minutes a day to help avoid risk of pneumonia or  DVT.  She should avoid driving for at least a week, especially until she is  off all narcotics.  She should avoid any heavy lifting greater than 20  pounds  for  the next 3 weeks.  She should call if she has any fevers, chills, sweats,  nausea, vomiting, worsening abdominal pain, drainage, or any other concerns.  She is aware of our office number and expressed understanding and  appreciation with this plan.      Ardeth Sportsman, MD  Electronically Signed     SCG/MEDQ  D:  07/02/2006  T:  07/02/2006  Job:  [id number]   cc:   Vania Rea. Jarold Motto, MD, FACG, FACP, FAGA  [address]  [place]  [place] [address]   Tracey Harries, M.D.  Fax: [phone]

## 2011-05-19 LAB — CBC
HCT: 36.7
Hemoglobin: 12.5
MCHC: 34
MCV: 89.9
Platelets: 117 — ABNORMAL LOW
RBC: 4.08
RDW: 12.9
WBC: 6.6

## 2011-05-19 LAB — POCT I-STAT, CHEM 8
BUN: 13
Calcium, Ion: 1.23
Chloride: 104
Creatinine, Ser: 0.9
Glucose, Bld: 196 — ABNORMAL HIGH
HCT: 37
Hemoglobin: 12.6
Potassium: 3.6
Sodium: 139
TCO2: 23

## 2011-05-19 LAB — POCT CARDIAC MARKERS
CKMB, poc: 1.6
CKMB, poc: <1 — ABNORMAL LOW
CKMB, poc: <1 — ABNORMAL LOW
Myoglobin, poc: 29.2
Myoglobin, poc: 31.5
Myoglobin, poc: 32.1
Operator id: 244461
Operator id: 244461
Operator id: 244461
Troponin i, poc: <0.05
Troponin i, poc: <0.05
Troponin i, poc: <0.05

## 2011-05-19 LAB — D-DIMER, QUANTITATIVE: D-Dimer, Quant: 0.43

## 2011-06-02 LAB — CBC
HCT: 36.2
Hemoglobin: 12.6
MCHC: 34.9
MCV: 87.1
Platelets: 131 — ABNORMAL LOW
RBC: 4.16
RDW: 14
WBC: 6.2

## 2011-06-02 LAB — HEPATIC FUNCTION PANEL
ALT: 16
AST: 21
Albumin: 3.8
Alkaline Phosphatase: 68
Bilirubin, Direct: <0.1
Total Bilirubin: 0.7
Total Protein: 8.3

## 2011-06-02 LAB — URINALYSIS, ROUTINE W REFLEX MICROSCOPIC
Bilirubin Urine: NEGATIVE
Glucose, UA: NEGATIVE
Hgb urine dipstick: NEGATIVE
Ketones, ur: NEGATIVE
Nitrite: NEGATIVE
Protein, ur: NEGATIVE
Specific Gravity, Urine: 1.021
Urobilinogen, UA: 1
pH: 6

## 2011-06-02 LAB — BASIC METABOLIC PANEL
BUN: 13
CO2: 27
Calcium: 9.5
Chloride: 102
Creatinine, Ser: 0.9
GFR calc Af Amer: >60
GFR calc non Af Amer: >60
Glucose, Bld: 103 — ABNORMAL HIGH
Potassium: 3.6
Sodium: 137

## 2011-06-02 LAB — DIFFERENTIAL
Basophils Absolute: 0
Basophils Relative: 0
Eosinophils Absolute: 0
Eosinophils Relative: 0
Lymphocytes Relative: 18
Lymphs Abs: 1.1
Monocytes Absolute: 0.5
Monocytes Relative: 8
Neutro Abs: 4.5
Neutrophils Relative %: 74

## 2011-06-02 LAB — LIPASE, BLOOD: Lipase: 27

## 2011-06-02 LAB — PREGNANCY, URINE: Preg Test, Ur: NEGATIVE

## 2011-06-05 ENCOUNTER — Emergency Department (HOSPITAL_COMMUNITY)
Admission: EM | Admit: 2011-06-05 | Discharge: 2011-06-05 | Discharge status: Against Medical Advice | Payer: Self-pay | Attending: Emergency Medicine | Admitting: Emergency Medicine

## 2011-06-05 ENCOUNTER — Telehealth: Payer: Self-pay | Admitting: *Deleted

## 2011-06-05 DIAGNOSIS — Z0389 Encounter for observation for other suspected diseases and conditions ruled out: Secondary | ICD-10-CM | POA: Insufficient documentation

## 2011-06-05 NOTE — Telephone Encounter (Signed)
Patient requesting samples of BP med.

## 2011-06-05 NOTE — Telephone Encounter (Signed)
Pt informed

## 2012-01-16 ENCOUNTER — Encounter (HOSPITAL_COMMUNITY): Payer: Self-pay | Admitting: *Deleted

## 2012-01-16 ENCOUNTER — Emergency Department (HOSPITAL_COMMUNITY)
Admission: EM | Admit: 2012-01-16 | Discharge: 2012-01-16 | Discharge status: Against Medical Advice | Payer: Self-pay | Attending: Emergency Medicine | Admitting: Emergency Medicine

## 2012-01-16 DIAGNOSIS — N949 Unspecified condition associated with female genital organs and menstrual cycle: Secondary | ICD-10-CM | POA: Insufficient documentation

## 2012-01-16 DIAGNOSIS — R110 Nausea: Secondary | ICD-10-CM | POA: Insufficient documentation

## 2012-01-16 HISTORY — DX: Essential (primary) hypertension: I10

## 2012-01-16 HISTORY — DX: Diverticulitis of intestine, part unspecified, without perforation or abscess without bleeding: K57.92

## 2012-01-16 LAB — URINALYSIS, ROUTINE W REFLEX MICROSCOPIC
Bilirubin Urine: NEGATIVE
Glucose, UA: NEGATIVE mg/dL
Hgb urine dipstick: NEGATIVE
Ketones, ur: NEGATIVE mg/dL
Leukocytes, UA: NEGATIVE
Nitrite: NEGATIVE
Protein, ur: NEGATIVE mg/dL
Specific Gravity, Urine: 1.021 (ref 1.005–1.030)
Urobilinogen, UA: 0.2 mg/dL (ref 0.0–1.0)
pH: 6 (ref 5.0–8.0)

## 2012-01-16 LAB — PREGNANCY, URINE: Preg Test, Ur: NEGATIVE

## 2012-01-16 NOTE — Progress Notes (Signed)
Pt listed as self pay with no insurance coverage Pt confirms she is self pay [place] resident.  CM and [facility] community liaison spoke with her Pt offered [facility] services to assist with finding a [place] self pay provider  Dr Debby Bud listed as pcp

## 2012-01-16 NOTE — ED Notes (Signed)
Pt reports R pelvic pain x 2 weeks with nausea.  Pt reports noticing a "knot" Sunday from the area where the pain is.  Pt denies any urinary or gynecological sxs at this time.

## 2012-01-16 NOTE — ED Notes (Signed)
Pt called for room x2 and x3 by Duwayne Heck, NT. No response.

## 2012-01-16 NOTE — ED Notes (Signed)
Pt called x1 for room, no response.  

## 2012-02-20 ENCOUNTER — Emergency Department (HOSPITAL_COMMUNITY)
Admission: EM | Admit: 2012-02-20 | Discharge: 2012-02-21 | Disposition: A | Discharge status: Home / Self Care | Payer: Self-pay | Attending: Emergency Medicine | Admitting: Emergency Medicine

## 2012-02-20 ENCOUNTER — Emergency Department (INDEPENDENT_AMBULATORY_CARE_PROVIDER_SITE_OTHER)
Admission: EM | Admit: 2012-02-20 | Discharge: 2012-02-20 | Disposition: A | Discharge status: Nursing Home (Includes ICF) | Payer: Self-pay | Source: Home / Self Care

## 2012-02-20 ENCOUNTER — Encounter (HOSPITAL_COMMUNITY): Payer: Self-pay | Admitting: *Deleted

## 2012-02-20 ENCOUNTER — Encounter (HOSPITAL_COMMUNITY): Payer: Self-pay | Admitting: Emergency Medicine

## 2012-02-20 DIAGNOSIS — R1011 Right upper quadrant pain: Secondary | ICD-10-CM

## 2012-02-20 DIAGNOSIS — I10 Essential (primary) hypertension: Secondary | ICD-10-CM | POA: Insufficient documentation

## 2012-02-20 DIAGNOSIS — R109 Unspecified abdominal pain: Secondary | ICD-10-CM

## 2012-02-20 LAB — COMPREHENSIVE METABOLIC PANEL
ALT: 19 U/L (ref 0–35)
AST: 23 U/L (ref 0–37)
Albumin: 4 g/dL (ref 3.5–5.2)
Alkaline Phosphatase: 66 U/L (ref 39–117)
BUN: 11 mg/dL (ref 6–23)
CO2: 26 mEq/L (ref 19–32)
Calcium: 9.8 mg/dL (ref 8.4–10.5)
Chloride: 101 mEq/L (ref 96–112)
Creatinine, Ser: 0.91 mg/dL (ref 0.50–1.10)
GFR calc Af Amer: 85 mL/min — ABNORMAL LOW (ref >90)
GFR calc non Af Amer: 73 mL/min — ABNORMAL LOW (ref >90)
Glucose, Bld: 80 mg/dL (ref 70–99)
Potassium: 3.8 mEq/L (ref 3.5–5.1)
Sodium: 139 mEq/L (ref 135–145)
Total Bilirubin: 0.3 mg/dL (ref 0.3–1.2)
Total Protein: 7.7 g/dL (ref 6.0–8.3)

## 2012-02-20 LAB — CBC WITH DIFFERENTIAL/PLATELET
Basophils Absolute: 0 10*3/uL (ref 0.0–0.1)
Basophils Relative: 0 % (ref 0–1)
Eosinophils Absolute: 0.1 10*3/uL (ref 0.0–0.7)
Eosinophils Relative: 1 % (ref 0–5)
HCT: 37.2 % (ref 36.0–46.0)
Hemoglobin: 12.7 g/dL (ref 12.0–15.0)
Lymphocytes Relative: 32 % (ref 12–46)
Lymphs Abs: 2.2 10*3/uL (ref 0.7–4.0)
MCH: 29.9 pg (ref 26.0–34.0)
MCHC: 34.1 g/dL (ref 30.0–36.0)
MCV: 87.5 fL (ref 78.0–100.0)
Monocytes Absolute: 0.6 10*3/uL (ref 0.1–1.0)
Monocytes Relative: 8 % (ref 3–12)
Neutro Abs: 4 10*3/uL (ref 1.7–7.7)
Neutrophils Relative %: 59 % (ref 43–77)
Platelets: 134 10*3/uL — ABNORMAL LOW (ref 150–400)
RBC: 4.25 MIL/uL (ref 3.87–5.11)
RDW: 13 % (ref 11.5–15.5)
WBC: 6.8 10*3/uL (ref 4.0–10.5)

## 2012-02-20 NOTE — ED Provider Notes (Signed)
History     CSN: [id number]  Arrival date & time 02/20/12  1650   None     Chief Complaint  Patient presents with  . Abdominal Pain    (Consider location/radiation/quality/duration/timing/severity/associated sxs/prior treatment) Patient is a 46 y.o. female presenting with abdominal pain. The history is provided by the patient.  Abdominal Pain The primary symptoms of the illness include abdominal pain.  Patient complains of RUQ pain that began six months ago, reports pain resolved but returned 3 weeks ago, pain worse after eating, radiates to mid back and into groin associated with nausea-no vomiting.  Pain is positional and aggravated by palpation.  No known fever noted, no change in urine or stool.  Reports constipation.  PMH includes obesity, htn.  Sxh 1.82ft colon removed 2008. Nonsmoker, nondrinker   Past Medical History  Diagnosis Date  . Hypertension   . Diverticulitis     Past Surgical History  Procedure Date  . Colon resection     No family history on file.  History  Substance Use Topics  . Smoking status: Never Smoker   . Smokeless tobacco: Not on file  . Alcohol Use: No    OB History    Grav Para Term Preterm Abortions TAB SAB Ect Mult Living                  Review of Systems  Gastrointestinal: Positive for abdominal pain.  All other systems reviewed and are negative.    Allergies  Ibuprofen and Lisinopril  Home Medications   Current Outpatient Rx  Name Route Sig Dispense Refill  . OLMESARTAN MEDOXOMIL-HCTZ 40-25 MG PO TABS Oral Take 1 tablet by mouth daily.      BP 160/79  Pulse 80  Temp 98 F (36.7 C) (Oral)  Resp 14  SpO2 100%  LMP 02/05/2012  Physical Exam  Nursing note and vitals reviewed. Constitutional: She is oriented to person, place, and time. Vital signs are normal. She appears well-developed and well-nourished. She is active and cooperative.  HENT:  Head: Normocephalic.  Mouth/Throat: Uvula is midline, oropharynx is clear  and moist and mucous membranes are normal.  Eyes: Conjunctivae are normal. Pupils are equal, round, and reactive to light. No scleral icterus.  Neck: Trachea normal. Neck supple. No hepatojugular reflux present.  Cardiovascular: Normal rate, regular rhythm and normal pulses.   Pulmonary/Chest: Effort normal and breath sounds normal.  Abdominal: She exhibits distension, ascites and pulsatile midline mass. She exhibits no pulsatile liver, no abdominal bruit and no mass. There is tenderness in the right upper quadrant, right lower quadrant and epigastric area. There is positive Murphy's sign. There is no rebound, no CVA tenderness and no tenderness at McBurney's point.  Neurological: She is alert and oriented to person, place, and time. No cranial nerve deficit or sensory deficit.  Skin: Skin is warm and dry. She is not diaphoretic.  Psychiatric: She has a normal mood and affect. Her speech is normal and behavior is normal. Judgment and thought content normal. Cognition and memory are normal.    ED Course  Procedures (including critical care time)  Labs Reviewed - No data to display No results found.   1. RUQ pain       MDM  Transfer to [facility] for evaluation and treatment of RUQ pain.        Johnsie Kindred, NP 02/20/12 1953

## 2012-02-20 NOTE — ED Notes (Signed)
Sent from [facility] for abd pain angry that she was there since 1700 and she does not wish to discuss her symptoms

## 2012-02-20 NOTE — ED Notes (Signed)
The pt had a urine doen at [facility]

## 2012-02-20 NOTE — ED Notes (Signed)
Right upper quadrant pain that radiates into mid back and down right side into right lower abdomen.  Reports frequent nausea, no vomiting.  Rare vomiting.  Denies diarrhea.  Pain in abdomen has been for 3 weeks , increasing in episodes and severity.  Reports pain as burning.  Patient reports more discomfort with eating or drinking.  History of diverticulitis.  Denies urinary symptoms

## 2012-02-21 ENCOUNTER — Encounter (HOSPITAL_COMMUNITY): Payer: Self-pay | Admitting: Emergency Medicine

## 2012-02-21 ENCOUNTER — Emergency Department (HOSPITAL_COMMUNITY): Payer: Self-pay

## 2012-02-21 LAB — URINALYSIS, ROUTINE W REFLEX MICROSCOPIC
Bilirubin Urine: NEGATIVE
Glucose, UA: NEGATIVE mg/dL
Hgb urine dipstick: NEGATIVE
Ketones, ur: NEGATIVE mg/dL
Leukocytes, UA: NEGATIVE
Nitrite: NEGATIVE
Protein, ur: NEGATIVE mg/dL
Specific Gravity, Urine: 1.042 — ABNORMAL HIGH (ref 1.005–1.030)
Urobilinogen, UA: 0.2 mg/dL (ref 0.0–1.0)
pH: 5 (ref 5.0–8.0)

## 2012-02-21 LAB — LIPASE, BLOOD: Lipase: 26 U/L (ref 11–59)

## 2012-02-21 MED ORDER — ONDANSETRON 8 MG PO TBDP: 8.0000 mg | ORAL_TABLET | Freq: Two times a day (BID) | ORAL | Status: AC | PRN

## 2012-02-21 MED ORDER — ONDANSETRON HCL 4 MG/2ML IJ SOLN
4.0000 mg | Freq: Once | INTRAMUSCULAR | Status: AC
  Administered 2012-02-21: 4 mg via INTRAVENOUS
  Filled 2012-02-21 (×2): qty 2

## 2012-02-21 MED ORDER — HYDROCODONE-ACETAMINOPHEN 5-325 MG PO TABS: ORAL_TABLET | ORAL | Status: AC

## 2012-02-21 MED ORDER — IOHEXOL 300 MG/ML  SOLN
20.0000 mL | INTRAMUSCULAR | Status: AC
  Administered 2012-02-21: 20 mL via ORAL

## 2012-02-21 MED ORDER — HYDROMORPHONE HCL PF 1 MG/ML IJ SOLN
1.0000 mg | Freq: Once | INTRAMUSCULAR | Status: AC
  Administered 2012-02-21: 1 mg via INTRAVENOUS
  Filled 2012-02-21: qty 1

## 2012-02-21 MED ORDER — SODIUM CHLORIDE 0.9 % IV SOLN
Freq: Once | INTRAVENOUS | Status: AC
  Administered 2012-02-21: 01:00:00 via INTRAVENOUS

## 2012-02-21 MED ORDER — PANTOPRAZOLE SODIUM 40 MG PO TBEC: 40.0000 mg | DELAYED_RELEASE_TABLET | Freq: Every day | ORAL | Status: DC

## 2012-02-21 MED ORDER — IOHEXOL 300 MG/ML  SOLN
100.0000 mL | Freq: Once | INTRAMUSCULAR | Status: AC | PRN
  Administered 2012-02-21: 100 mL via INTRAVENOUS

## 2012-02-21 NOTE — Discharge Instructions (Signed)
 Abdominal Pain Abdominal pain can be caused by many things. Your caregiver decides the seriousness of your pain by an examination and possibly blood tests and X-rays. Many cases can be observed and treated at home. Most abdominal pain is not caused by a disease and will probably improve without treatment. However, in many cases, more time must pass before a clear cause of the pain can be found. Before that point, it may not be known if you need more testing, or if hospitalization or surgery is needed. HOME CARE INSTRUCTIONS   Do not take laxatives unless directed by your caregiver.   Take pain medicine only as directed by your caregiver.   Only take over-the-counter or prescription medicines for pain, discomfort, or fever as directed by your caregiver.   Try a clear liquid diet (broth, tea, or water) for as long as directed by your caregiver. Slowly move to a bland diet as tolerated.  SEEK IMMEDIATE MEDICAL CARE IF:   The pain does not go away.   You have a fever.   You keep throwing up (vomiting).   The pain is felt only in portions of the abdomen. Pain in the right side could possibly be appendicitis. In an adult, pain in the left lower portion of the abdomen could be colitis or diverticulitis.   You pass bloody or black tarry stools.  MAKE SURE YOU:   Understand these instructions.   Will watch your condition.   Will get help right away if you are not doing well or get worse.  Document Released: 05/17/2005 Document Revised: 07/27/2011 Document Reviewed: 03/25/2008 [facility] Patient Information 2012 [place], [place].      Narcotic and benzodiazepine use may cause drowsiness, slowed breathing or dependence.  Please use with caution and do not drive, operate machinery or watch young children alone while taking them.  Taking combinations of these medications or drinking alcohol will potentiate these effects.  Narcotics can cause or worsen constipation so use with caution and plan  accordingly.

## 2012-02-21 NOTE — ED Notes (Signed)
PT states unable to provide urine at this time 

## 2012-02-21 NOTE — ED Provider Notes (Signed)
History     CSN: [id number]  Arrival date & time 02/20/12  2011   First MD Initiated Contact with Patient 02/20/12 2349      Chief Complaint  Patient presents with  . Abdominal Pain    (Consider location/radiation/quality/duration/timing/severity/associated sxs/prior treatment) HPI Comments: Patient was seen at the urgent care earlier today for abdominal pain to the right side that radiated to her right lower quadrant and was sent here for further evaluation. Patient reports that she has had pain for approximately 3 weeks straight. She reports that she has been more constipated but also- gassywhen she eats certain foods. Her history is significant for severe diverticulitis for which she required a partial colectomy in 2008. She denies hematuria, urgency, dysuria. No significant nausea or vomiting. She denies any GYN symptoms such as vaginal bleeding or discharge. She does not think that she's had fever or chills with her pain. She reports that she did have approximately one week of similar pain approximately 6 or 7 months ago that did improve on its own. She has not been previously evaluated for this new pain.  Patient is a 46 y.o. female presenting with abdominal pain. The history is provided by the patient and medical records.  Abdominal Pain The primary symptoms of the illness include abdominal pain. The primary symptoms of the illness do not include fever, nausea, vomiting, dysuria, vaginal discharge or vaginal bleeding.  Additional symptoms associated with the illness include constipation. Symptoms associated with the illness do not include chills or back pain.    Past Medical History  Diagnosis Date  . Hypertension   . Diverticulitis     Past Surgical History  Procedure Date  . Colon resection     History reviewed. No pertinent family history.  History  Substance Use Topics  . Smoking status: Never Smoker   . Smokeless tobacco: Not on file  . Alcohol Use: No    OB  History    Grav Para Term Preterm Abortions TAB SAB Ect Mult Living                  Review of Systems  Constitutional: Positive for appetite change. Negative for fever, chills and unexpected weight change.  Gastrointestinal: Positive for abdominal pain and constipation. Negative for nausea, vomiting and blood in stool.  Genitourinary: Negative for dysuria, flank pain, vaginal bleeding and vaginal discharge.  Musculoskeletal: Negative for back pain.  All other systems reviewed and are negative.    Allergies  Ibuprofen and Lisinopril  Home Medications   Current Outpatient Rx  Name Route Sig Dispense Refill  . OLMESARTAN MEDOXOMIL-HCTZ 40-25 MG PO TABS Oral Take 1 tablet by mouth daily.      BP 129/80  Pulse 84  Temp 98.2 F (36.8 C) (Oral)  Resp 20  SpO2 98%  LMP 02/05/2012  Physical Exam  Nursing note and vitals reviewed. Constitutional: She is oriented to person, place, and time. She appears well-developed and well-nourished. No distress.  HENT:  Head: Normocephalic and atraumatic.  Eyes: Pupils are equal, round, and reactive to light. No scleral icterus.  Neck: Normal range of motion. Neck supple.  Cardiovascular: Normal rate.   Pulmonary/Chest: Effort normal. No respiratory distress. She has no wheezes.  Abdominal: Soft. She exhibits no distension. There is tenderness. There is no rebound, no guarding and no CVA tenderness.    Neurological: She is alert and oriented to person, place, and time.  Skin: Skin is warm and dry. No rash noted.  ED Course  Procedures (including critical care time)  Labs Reviewed  CBC WITH DIFFERENTIAL - Abnormal; Notable for the following:    Platelets 134 (*)     All other components within normal limits  COMPREHENSIVE METABOLIC PANEL - Abnormal; Notable for the following:    GFR calc non Af Amer 73 (*)     GFR calc Af Amer 85 (*)     All other components within normal limits  LIPASE, BLOOD  URINALYSIS, ROUTINE W REFLEX  MICROSCOPIC   Ct Abdomen Pelvis W Contrast  02/21/2012  *RADIOLOGY REPORT*  Clinical Data: Abdominal pain  CT ABDOMEN AND PELVIS WITH CONTRAST  Technique:  Multidetector CT imaging of the abdomen and pelvis was performed following the standard protocol during bolus administration of intravenous contrast.  Contrast: OMNIPAQUE IOHEXOL 300 MG/ML  SOLN  Comparison: 03/08/2010  Findings: Limited images through the lung bases demonstrate no significant appreciable abnormality. The heart size is within normal limits. No pleural or pericardial effusion.  Hepatic steatosis.  Unremarkable biliary system, spleen, pancreas, adrenal glands.  Symmetric renal enhancement with the exception of a simple cyst arising from the lower pole of the right kidney.  No hydronephrosis or hydroureter.  No bowel obstruction.  No CT evidence for colitis.  Normal appendix.  No free intraperitoneal air or fluid.  No lymphadenopathy.  Normal caliber vasculature with mild scattered atherosclerosis.  Thin-walled bladder.  Lobular uterus with areas of punctate calcification, suggests underlying fibroids.  1.8 cm adnexal cysts are likely physiologic.  No acute osseous finding.  IMPRESSION: Hepatic steatosis.  Fibroid uterus.  No acute abnormality identified by CT.  Original Report Authenticated By: Waneta Martins, M.D.     No diagnosis found.    MDM   Given the length of symptoms, history of diverticulitis status post partial colectomy, I think CT scan is warranted and further evaluation. Her symptoms and tenderness are mainly in the right upper quadrant. However since some of the pain does radiate toward her groin, renal colic may be a possibility. She is afebrile vital signs are otherwise relatively stable. Her white count is normal, hemoglobin is normal. Her LFTs and bilirubin are also normal. Her CT scan I reviewed myself, and according to the radiologist shows no acute abnormalities. She does have a fatty liver which is  discussed with the patient. My plan is to provide her a prescription for pain medication as well as a PPI. Patient understands and is to followup with her primary care physician and I can certainly refer her to a gastroenterologist for further evaluation as well. Patient understands and is agreeable to this plan. She knows to return for any sudden worsening symptoms.       Gavin Pound. Jaydrien Wassenaar, MD 02/21/12 0300

## 2012-02-21 NOTE — ED Notes (Signed)
Patient given discharge paperwork; went over discharge instructions with patient.  Patient instructed to take Protonix, Norco, and Zofran as directed, to not drive while taking Norco, to follow up with both referrals, and to return to the ED for new, worsening, or concerning symptoms.

## 2012-02-22 NOTE — ED Provider Notes (Signed)
Medical screening examination/treatment/procedure(s) were performed by resident physician or non-physician practitioner and as supervising physician I was immediately available for consultation/collaboration.   KINDL,JAMES DOUGLAS MD.    James D Kindl, MD 02/22/12 1548 

## 2012-04-12 ENCOUNTER — Telehealth: Payer: Self-pay | Admitting: Internal Medicine

## 2012-04-12 MED ORDER — OLMESARTAN MEDOXOMIL-HCTZ 40-25 MG PO TABS: 1.0000 | ORAL_TABLET | Freq: Every day | ORAL | Status: DC

## 2012-04-12 NOTE — Telephone Encounter (Signed)
Caller: [name] [name]/Patient; Phone: [phone]; Reason for Call: Please call pt to advise if office has any samples of Benicar; she has been out for over week and does not have insurance.

## 2012-04-18 ENCOUNTER — Ambulatory Visit: Payer: Self-pay | Admitting: Internal Medicine

## 2012-04-18 DIAGNOSIS — Z0289 Encounter for other administrative examinations: Secondary | ICD-10-CM

## 2012-06-26 ENCOUNTER — Emergency Department (HOSPITAL_COMMUNITY): Payer: Self-pay

## 2012-06-26 ENCOUNTER — Emergency Department (HOSPITAL_COMMUNITY)
Admission: EM | Admit: 2012-06-26 | Discharge: 2012-06-26 | Disposition: A | Discharge status: Home / Self Care | Payer: Self-pay | Attending: Emergency Medicine | Admitting: Emergency Medicine

## 2012-06-26 ENCOUNTER — Encounter (HOSPITAL_COMMUNITY): Payer: Self-pay | Admitting: Emergency Medicine

## 2012-06-26 DIAGNOSIS — Z79899 Other long term (current) drug therapy: Secondary | ICD-10-CM | POA: Insufficient documentation

## 2012-06-26 DIAGNOSIS — R059 Cough, unspecified: Secondary | ICD-10-CM | POA: Insufficient documentation

## 2012-06-26 DIAGNOSIS — I10 Essential (primary) hypertension: Secondary | ICD-10-CM | POA: Insufficient documentation

## 2012-06-26 DIAGNOSIS — K5732 Diverticulitis of large intestine without perforation or abscess without bleeding: Secondary | ICD-10-CM | POA: Insufficient documentation

## 2012-06-26 DIAGNOSIS — R071 Chest pain on breathing: Secondary | ICD-10-CM | POA: Insufficient documentation

## 2012-06-26 DIAGNOSIS — R05 Cough: Secondary | ICD-10-CM | POA: Insufficient documentation

## 2012-06-26 DIAGNOSIS — R0789 Other chest pain: Secondary | ICD-10-CM

## 2012-06-26 LAB — BASIC METABOLIC PANEL
BUN: 13 mg/dL (ref 6–23)
CO2: 26 mEq/L (ref 19–32)
Calcium: 9.4 mg/dL (ref 8.4–10.5)
Chloride: 100 mEq/L (ref 96–112)
Creatinine, Ser: 0.95 mg/dL (ref 0.50–1.10)
GFR calc Af Amer: 81 mL/min — ABNORMAL LOW (ref >90)
GFR calc non Af Amer: 70 mL/min — ABNORMAL LOW (ref >90)
Glucose, Bld: 86 mg/dL (ref 70–99)
Potassium: 3.6 mEq/L (ref 3.5–5.1)
Sodium: 138 mEq/L (ref 135–145)

## 2012-06-26 LAB — CBC
HCT: 37.3 % (ref 36.0–46.0)
Hemoglobin: 12.8 g/dL (ref 12.0–15.0)
MCH: 30.3 pg (ref 26.0–34.0)
MCHC: 34.3 g/dL (ref 30.0–36.0)
MCV: 88.4 fL (ref 78.0–100.0)
Platelets: 137 10*3/uL — ABNORMAL LOW (ref 150–400)
RBC: 4.22 MIL/uL (ref 3.87–5.11)
RDW: 13.2 % (ref 11.5–15.5)
WBC: 5.3 10*3/uL (ref 4.0–10.5)

## 2012-06-26 LAB — POCT I-STAT TROPONIN I: Troponin i, poc: 0 ng/mL (ref 0.00–0.08)

## 2012-06-26 LAB — PRO B NATRIURETIC PEPTIDE: Pro B Natriuretic peptide (BNP): 12.9 pg/mL (ref 0–125)

## 2012-06-26 MED ORDER — TRAMADOL HCL 50 MG PO TABS: 50.0000 mg | ORAL_TABLET | Freq: Four times a day (QID) | ORAL | Status: DC | PRN

## 2012-06-26 MED ORDER — ASPIRIN 81 MG PO CHEW
324.0000 mg | CHEWABLE_TABLET | Freq: Once | ORAL | Status: AC
  Administered 2012-06-26: 324 mg via ORAL
  Filled 2012-06-26: qty 4

## 2012-06-26 NOTE — ED Provider Notes (Signed)
History     CSN: [id number]  Arrival date & time 06/26/12  1054   First MD Initiated Contact with Patient 06/26/12 1056      Chief Complaint  Patient presents with  . Chest Pain    (Consider location/radiation/quality/duration/timing/severity/associated sxs/prior treatment) HPI Comments: Patient presents today with a chief complaint of chest pain.  Pain located right anterior chest radiating to right scapula.  Pain has been constant for the past 2 days.  She describes the pain as a "sharp, gnawing" pain.  Nothing makes the pain better or worse.  She initially thought that her symptoms were indigestion and took [employer], but did not feel that it helped.  She reports that she has felt nauseous, but no vomiting.  No diaphoresis, shortness of breath, dizziness, or lightheadedness.  She states that she did start coughing earlier today.  She is coughing up whitish color sputum.  No hemoptysis.  She denies prior cardiac history.  She does have a history of HTN, but no history of Hyperlipidemia or DM.  She does not smoke.  No FH of cardiac disease.  She denies LE swelling or pain.  No prolonged travel or surgery in the past 4 weeks.  No prior history of DVT or PE.  No prior history of Cancer.  She is currently not on any estrogen containing medication.  The history is provided by the patient.    Past Medical History  Diagnosis Date  . Hypertension   . Diverticulitis     Past Surgical History  Procedure Date  . Colon resection     No family history on file.  History  Substance Use Topics  . Smoking status: Never Smoker   . Smokeless tobacco: Not on file  . Alcohol Use: No    OB History    Grav Para Term Preterm Abortions TAB SAB Ect Mult Living                  Review of Systems  Constitutional: Negative for fever and chills.  HENT: Negative for neck pain and neck stiffness.   Respiratory: Positive for cough. Negative for shortness of breath and wheezing.     Cardiovascular: Positive for chest pain. Negative for palpitations and leg swelling.  Gastrointestinal: Positive for nausea. Negative for vomiting and abdominal pain.  Skin: Negative for rash.  Neurological: Negative for dizziness, syncope and light-headedness.    Allergies  Ibuprofen and Lisinopril  Home Medications   Current Outpatient Rx  Name  Route  Sig  Dispense  Refill  . OLMESARTAN MEDOXOMIL-HCTZ 40-25 MG PO TABS   Oral   Take 1 tablet by mouth daily.   28 tablet   0   . PANTOPRAZOLE SODIUM 40 MG PO TBEC   Oral   Take 1 tablet (40 mg total) by mouth daily.   14 tablet   0     BP 148/90  Pulse 88  Temp 98.6 F (37 C) (Oral)  Resp 20  SpO2 100%  LMP 06/01/2012  Physical Exam  Nursing note and vitals reviewed. Constitutional: She appears well-developed and well-nourished.  HENT:  Head: Normocephalic and atraumatic.  Mouth/Throat: Oropharynx is clear and moist.  Neck: Normal range of motion. Neck supple.  Cardiovascular: Normal rate, regular rhythm, normal heart sounds and intact distal pulses.   Pulmonary/Chest: Effort normal and breath sounds normal. No respiratory distress. She has no wheezes. She has no rales. She exhibits tenderness.    Abdominal: Soft. There is no tenderness.  Musculoskeletal: Normal range of motion.       No LE edema or erythema  Neurological: She is alert.  Skin: Skin is warm and dry. No rash noted.  Psychiatric: She has a normal mood and affect.    ED Course  Procedures (including critical care time)   Labs Reviewed  CBC  BASIC METABOLIC PANEL  PRO B NATRIURETIC PEPTIDE   Dg Chest Port 1 View  06/26/2012  *RADIOLOGY REPORT*  Clinical Data: Right chest pain, hypertension  CHEST - 1 VIEW  Comparison:  03/12/2008  Findings: The heart size and mediastinal contours are within normal limits.  Both lungs are clear.  IMPRESSION: No active disease.   Original Report Authenticated By: Judie Petit. Miles Costain, M.D.      No diagnosis found.    Date: 06/27/2012  Rate: 84  Rhythm: normal sinus rhythm  QRS Axis: normal  Intervals: normal  ST/T Wave abnormalities: normal  Conduction Disutrbances:none  Narrative Interpretation:   Old EKG Reviewed: unchanged     MDM  Patient is to be discharged with recommendation to follow up with PCP in regards to today's hospital visit. Chest pain is not likely of cardiac or pulmonary etiology d/t presentation, PERC negative, VSS, Heart RRR, breath sounds equal bilaterally, EKG without acute abnormalities, negative troponin, and negative CXR.   Chest wall tender to palpation.   Pt appears reliable for follow up and is agreeable to discharge.   Case has been discussed with and seen by Dr. Estell Harpin who agrees with the above plan to discharge.         Pascal Lux Whitfield, PA-C 06/27/12 1016

## 2012-06-26 NOTE — ED Notes (Signed)
Pt presenting to ed with c/o chest pain x 6 days pt states positive shortness of breath. Pt denies nausea and vomiting at this time. Pt states her neighbor was spraying something in the yard and she's not sure is this is what started her chest pain. Pt states she also thought it was reflux and she's been taking her medications and it's not helping.

## 2012-07-02 NOTE — ED Provider Notes (Signed)
Medical screening examination/treatment/procedure(s) were performed by non-physician practitioner and as supervising physician I was immediately available for consultation/collaboration.   Cedrick Partain L Ajahni Nay, MD 07/02/12 1418 

## 2012-07-12 ENCOUNTER — Emergency Department (INDEPENDENT_AMBULATORY_CARE_PROVIDER_SITE_OTHER): Payer: Self-pay

## 2012-07-12 ENCOUNTER — Emergency Department (INDEPENDENT_AMBULATORY_CARE_PROVIDER_SITE_OTHER)
Admission: EM | Admit: 2012-07-12 | Discharge: 2012-07-12 | Disposition: A | Discharge status: Home / Self Care | Payer: Self-pay | Source: Home / Self Care | Attending: Emergency Medicine | Admitting: Emergency Medicine

## 2012-07-12 ENCOUNTER — Encounter (HOSPITAL_COMMUNITY): Payer: Self-pay | Admitting: Emergency Medicine

## 2012-07-12 DIAGNOSIS — B009 Herpesviral infection, unspecified: Secondary | ICD-10-CM

## 2012-07-12 DIAGNOSIS — K760 Fatty (change of) liver, not elsewhere classified: Secondary | ICD-10-CM

## 2012-07-12 DIAGNOSIS — K7689 Other specified diseases of liver: Secondary | ICD-10-CM

## 2012-07-12 LAB — CBC WITH DIFFERENTIAL/PLATELET
Basophils Absolute: 0 10*3/uL (ref 0.0–0.1)
Basophils Relative: 0 % (ref 0–1)
Eosinophils Absolute: 0 10*3/uL (ref 0.0–0.7)
Eosinophils Relative: 1 % (ref 0–5)
HCT: 37.3 % (ref 36.0–46.0)
Hemoglobin: 12.6 g/dL (ref 12.0–15.0)
Lymphocytes Relative: 31 % (ref 12–46)
Lymphs Abs: 1.6 10*3/uL (ref 0.7–4.0)
MCH: 30.2 pg (ref 26.0–34.0)
MCHC: 33.8 g/dL (ref 30.0–36.0)
MCV: 89.4 fL (ref 78.0–100.0)
Monocytes Absolute: 0.5 10*3/uL (ref 0.1–1.0)
Monocytes Relative: 9 % (ref 3–12)
Neutro Abs: 3.1 10*3/uL (ref 1.7–7.7)
Neutrophils Relative %: 59 % (ref 43–77)
Platelets: 138 10*3/uL — ABNORMAL LOW (ref 150–400)
RBC: 4.17 MIL/uL (ref 3.87–5.11)
RDW: 13.2 % (ref 11.5–15.5)
WBC: 5.3 10*3/uL (ref 4.0–10.5)

## 2012-07-12 LAB — COMPREHENSIVE METABOLIC PANEL
ALT: 41 U/L — ABNORMAL HIGH (ref 0–35)
AST: 41 U/L — ABNORMAL HIGH (ref 0–37)
Albumin: 3.8 g/dL (ref 3.5–5.2)
Alkaline Phosphatase: 85 U/L (ref 39–117)
BUN: 9 mg/dL (ref 6–23)
CO2: 28 mEq/L (ref 19–32)
Calcium: 9.6 mg/dL (ref 8.4–10.5)
Chloride: 102 mEq/L (ref 96–112)
Creatinine, Ser: 0.8 mg/dL (ref 0.50–1.10)
GFR calc Af Amer: >90 mL/min (ref >90)
GFR calc non Af Amer: 86 mL/min — ABNORMAL LOW (ref >90)
Glucose, Bld: 92 mg/dL (ref 70–99)
Potassium: 3.6 mEq/L (ref 3.5–5.1)
Sodium: 140 mEq/L (ref 135–145)
Total Bilirubin: 0.4 mg/dL (ref 0.3–1.2)
Total Protein: 7.7 g/dL (ref 6.0–8.3)

## 2012-07-12 LAB — POCT URINALYSIS DIP (DEVICE)
Glucose, UA: NEGATIVE mg/dL
Hgb urine dipstick: NEGATIVE
Ketones, ur: NEGATIVE mg/dL
Leukocytes, UA: NEGATIVE
Nitrite: NEGATIVE
Protein, ur: NEGATIVE mg/dL
Specific Gravity, Urine: >=1.03 (ref 1.005–1.030)
Urobilinogen, UA: 1 mg/dL (ref 0.0–1.0)
pH: 5.5 (ref 5.0–8.0)

## 2012-07-12 LAB — LIPASE, BLOOD: Lipase: 24 U/L (ref 11–59)

## 2012-07-12 MED ORDER — ACYCLOVIR 400 MG PO TABS: 400.0000 mg | ORAL_TABLET | Freq: Three times a day (TID) | ORAL | Status: DC

## 2012-07-12 MED ORDER — ORLISTAT 120 MG PO CAPS: 120.0000 mg | ORAL_CAPSULE | Freq: Three times a day (TID) | ORAL | Status: DC

## 2012-07-12 NOTE — ED Provider Notes (Signed)
Chief Complaint  Patient presents with  . Abdominal Pain    History of Present Illness:    The patient is a 46 year old female who comes in today for 2 problems: Right upper quadrant abdominal pain, and a rash on her left buttock for the last 2 days.  1. Abdominal pain: The patient relates a 3 to four-month history of pain in the right upper quadrant of the abdomen that radiates into the right flank and into the right submammary area. The pain comes and goes although it can last for days at a time during which time it is constant. It sometimes goes away completely. Is rated a 9 or 10 in intensity, and it feels sharp. It hurts to touch the area and also to  eat. It's better if she lies on her left side. She denies any injury to the area. It's not worse with deep inspiration. She has felt somewhat nauseated and has been constipated. She denies any fever, chills, vomiting, diarrhea, blood in the stool, urinary or GYN complaints. She has a history of diverticulitis and had a partial colectomy in 2008. The patient was seen for this in July when the symptoms first began. She initially presented here he was sent over to the emergency room. She had a CT scan which showed normal gallbladder but fatty infiltration of the liver and a fibroid uterus. She was sent home with something for pain. The symptoms have not resolved since then. She was seen in the emergency room on November 6 for pain in the right upper chest, although she states she told the provider who examined her that the pain was in her abdomen at the time. She had a negative chest x-ray and EKG at that time and she was sent home. She denies any use of alcohol.  2. Rash on left buttock: She has had a recurring rash on her left buttock, dyspnea the gluteal cleft. This occurs every several months. It's painful, blistered up, and is associated with an enlarged, regional lymphadenopathy in the left groin.  Review of Systems:  Other than noted above, the  patient denies any of the following symptoms: Constitutional:  No fever, chills, fatigue, weight loss or anorexia. Lungs:  No cough or shortness of breath. Heart:  No chest pain, palpitations, syncope or edema.  No cardiac history. Abdomen:  No nausea, vomiting, hematememesis, melena, diarrhea, or hematochezia. GU:  No dysuria, frequency, urgency, or hematuria. Gyn:  No vaginal discharge, itching, abnormal bleeding, dyspareunia, or pelvic pain.  PMFSH:  Past medical history, family history, social history, meds, and allergies were reviewed along with nurse's notes.  No prior abdominal surgeries, past history of GI problems, STDs or GYN problems.  No history of aspirin or NSAID use.  No excessive alcohol intake.  Physical Exam:   Vital signs:  BP 140/80  Pulse 86  Temp 98.4 F (36.9 C) (Oral)  Resp 16  SpO2 98%  LMP 06/29/2012 Gen:  Alert, oriented, in no distress. Lungs:  Breath sounds clear and equal bilaterally.  No wheezes, rales or rhonchi. Heart:  Regular rhythm.  No gallops or murmers.   Abdomen:  Abdomen is soft, flat, nondistended. She has diffuse pain to palpation that extends from the right submammary area to the right upper quadrant to the right flank area of the abdomen. The most tender spot is just at the costal margin, anterolaterally. The liver is not palpable. She does have a positive Murphy's sign and Murphy's punch. Skin:  Clear, warm and  dry.  No rash. There is a cluster of vesicles on the left buttock just at the gluteal cleft consistent with herpes simplex.  Labs:   Results for orders placed during the hospital encounter of 07/12/12  CBC WITH DIFFERENTIAL      Component Value Range   WBC 5.3  4.0 - 10.5 K/uL   RBC 4.17  3.87 - 5.11 MIL/uL   Hemoglobin 12.6  12.0 - 15.0 g/dL   HCT 16.1  09.6 - 04.5 %   MCV 89.4  78.0 - 100.0 fL   MCH 30.2  26.0 - 34.0 pg   MCHC 33.8  30.0 - 36.0 g/dL   RDW 40.9  81.1 - 91.4 %   Platelets 138 (*) 150 - 400 K/uL   Neutrophils  Relative 59  43 - 77 %   Neutro Abs 3.1  1.7 - 7.7 K/uL   Lymphocytes Relative 31  12 - 46 %   Lymphs Abs 1.6  0.7 - 4.0 K/uL   Monocytes Relative 9  3 - 12 %   Monocytes Absolute 0.5  0.1 - 1.0 K/uL   Eosinophils Relative 1  0 - 5 %   Eosinophils Absolute 0.0  0.0 - 0.7 K/uL   Basophils Relative 0  0 - 1 %   Basophils Absolute 0.0  0.0 - 0.1 K/uL  COMPREHENSIVE METABOLIC PANEL      Component Value Range   Sodium 140  135 - 145 mEq/L   Potassium 3.6  3.5 - 5.1 mEq/L   Chloride 102  96 - 112 mEq/L   CO2 28  19 - 32 mEq/L   Glucose, Bld 92  70 - 99 mg/dL   BUN 9  6 - 23 mg/dL   Creatinine, Ser 7.82  0.50 - 1.10 mg/dL   Calcium 9.6  8.4 - 95.6 mg/dL   Total Protein 7.7  6.0 - 8.3 g/dL   Albumin 3.8  3.5 - 5.2 g/dL   AST 41 (*) 0 - 37 U/L   ALT 41 (*) 0 - 35 U/L   Alkaline Phosphatase 85  39 - 117 U/L   Total Bilirubin 0.4  0.3 - 1.2 mg/dL   GFR calc non Af Amer 86 (*) >90 mL/min   GFR calc Af Amer >90  >90 mL/min  LIPASE, BLOOD      Component Value Range   Lipase 24  11 - 59 U/L  POCT URINALYSIS DIP (DEVICE)      Component Value Range   Glucose, UA NEGATIVE  NEGATIVE mg/dL   Bilirubin Urine SMALL (*) NEGATIVE   Ketones, ur NEGATIVE  NEGATIVE mg/dL   Specific Gravity, Urine >=1.030  1.005 - 1.030   Hgb urine dipstick NEGATIVE  NEGATIVE   pH 5.5  5.0 - 8.0   Protein, ur NEGATIVE  NEGATIVE mg/dL   Urobilinogen, UA 1.0  0.0 - 1.0 mg/dL   Nitrite NEGATIVE  NEGATIVE   Leukocytes, UA NEGATIVE  NEGATIVE     Radiology:  Dg Chest Port 1 View  06/26/2012  *RADIOLOGY REPORT*  Clinical Data: Right chest pain, hypertension  CHEST - 1 VIEW  Comparison:  03/12/2008  Findings: The heart size and mediastinal contours are within normal limits.  Both lungs are clear.  IMPRESSION: No active disease.   Original Report Authenticated By: Judie Petit. Shick, M.D.    Dg Abd Acute W/chest  07/12/2012  *RADIOLOGY REPORT*  Clinical Data: Right upper quadrant pain for 4 months.  ACUTE ABDOMEN SERIES (ABDOMEN  2  VIEW & CHEST 1 VIEW)  Comparison: 06/26/2012 and CT of 02/21/2012.  Findings: Frontal view of the chest demonstrates midline trachea. Normal heart size for level of inspiration.  No pleural effusion or pneumothorax.  Clear lungs.  Abominal films demonstrate upright view demonstrating no free intraperitoneal air or significant air fluid levels.  A supine view demonstrating no bowel distention. No abnormal abdominal calcifications.   No appendicolith.  There are phleboliths in the pelvis.  Distal gas.  IMPRESSION: No acute findings.   Original Report Authenticated By: Jeronimo Greaves, M.D.     Assessment:  The primary encounter diagnosis was Non-alcoholic fatty liver disease. A diagnosis of Herpes simplex disease was also pertinent to this visit.  Differential diagnoses of abdominal pain includes gallbladder disease, musculoskeletal pain, costochondritis, kidney disease, but I think the most likely cause is non-alcoholic fatty infiltration of the liver. We'll try Orlistat and have her followup with a gastroenterologist.  Plan:   1.  The following meds were prescribed:   New Prescriptions   ACYCLOVIR (ZOVIRAX) 400 MG TABLET    Take 1 tablet (400 mg total) by mouth 3 (three) times daily.   ORLISTAT (XENICAL) 120 MG CAPSULE    Take 1 capsule (120 mg total) by mouth 3 (three) times daily with meals.   2.  The patient was instructed in symptomatic care and handouts were given. 3.  The patient was told to return if becoming worse in any way, if no better in 3 or 4 days, and given some red flag symptoms that would indicate earlier return.    Reuben Likes, MD 07/12/12 [phone]

## 2012-07-12 NOTE — ED Notes (Signed)
Multiple complaints. Pt having epigastric pain on the right side for several months, she states it comes and goes, but it gets worse every time. The pain is worse after eating, and is also tender to palpitation. Pt has having a "knot" in her right hip that is painful, and new bumps on her backside.

## 2012-07-15 NOTE — ED Notes (Signed)
Patient requesting a change in medicine secondary to expense.  Accessed chart to print for reviewing physician

## 2012-09-25 ENCOUNTER — Telehealth: Payer: Self-pay | Admitting: Internal Medicine

## 2012-09-25 NOTE — Telephone Encounter (Signed)
Please read phone note below and advise. 

## 2012-09-25 NOTE — Telephone Encounter (Signed)
Caller: Donte/Patient; Phone: [phone]; Reason for Call: Pt calling today 09/25/12 regarding wants to know if MD can give her some samples of blood pressure medication.  Normally takes Benicar.  York Spaniel has not had any blood pressure medication for over 1 week.  Is having headaches.  Pt refuses triage assessment.  Asking MD call her back to let her know if she can come by office to pick up samples.  PLEASE CALL PT BACK AT [phone].  Thanks.

## 2012-09-25 NOTE — Telephone Encounter (Signed)
May hve samples if we have them

## 2012-09-26 ENCOUNTER — Telehealth: Payer: Self-pay | Admitting: *Deleted

## 2012-09-26 NOTE — Telephone Encounter (Signed)
Called pt and lvm telling her that we have samples of Benicar for her to pick up and they will be up front.

## 2012-09-27 ENCOUNTER — Telehealth: Payer: Self-pay | Admitting: Internal Medicine

## 2012-09-27 NOTE — Telephone Encounter (Signed)
Her med rec shows both benicar and benicar/hct. She can bring them back and exchange for Benicar/Hct 40/25 if we have them

## 2012-09-27 NOTE — Telephone Encounter (Signed)
Lvm telling pt that her med rec shows both benicar and benicar/hct per Dr Debby Bud and that she can bring the samples in and exchange if we have them for the benicar/hct 40/25. Advised pt that we do not have any samples at this time and we will call her when we do.

## 2012-09-27 NOTE — Telephone Encounter (Signed)
Walk in sheet: Pt has questions about the Benicar samples she picked up.  These are different than the ones before.  The others had HCT 2.5/40. She thinks this says olmesartan medoxmil 20 mg. Patients LOV is July on 2011.

## 2013-01-10 ENCOUNTER — Ambulatory Visit (INDEPENDENT_AMBULATORY_CARE_PROVIDER_SITE_OTHER): Payer: Self-pay | Admitting: Internal Medicine

## 2013-01-10 ENCOUNTER — Encounter: Payer: Self-pay | Admitting: Internal Medicine

## 2013-01-10 VITALS — BP 130/88 | HR 91 | Temp 98.4°F | Ht 65.0 in | Wt 246.1 lb

## 2013-01-10 DIAGNOSIS — J019 Acute sinusitis, unspecified: Secondary | ICD-10-CM

## 2013-01-10 DIAGNOSIS — J309 Allergic rhinitis, unspecified: Secondary | ICD-10-CM

## 2013-01-10 DIAGNOSIS — I10 Essential (primary) hypertension: Secondary | ICD-10-CM

## 2013-01-10 MED ORDER — METHYLPREDNISOLONE ACETATE 80 MG/ML IJ SUSP
80.0000 mg | Freq: Once | INTRAMUSCULAR | Status: AC
  Administered 2013-01-10: 80 mg via INTRAMUSCULAR

## 2013-01-10 MED ORDER — PREDNISONE 10 MG PO TABS: ORAL_TABLET | ORAL | Status: DC

## 2013-01-10 MED ORDER — AZITHROMYCIN 250 MG PO TABS: ORAL_TABLET | ORAL | Status: DC

## 2013-01-10 MED ORDER — PROMETHAZINE HCL 25 MG PO TABS: 25.0000 mg | ORAL_TABLET | Freq: Four times a day (QID) | ORAL | Status: DC | PRN

## 2013-01-10 NOTE — Assessment & Plan Note (Addendum)
stable overall by history and exam, recent data reviewed with pt, and pt to continue medical treatment as before,  to f/u any worsening symptoms or concerns BP Readings from Last 3 Encounters:  01/10/13 130/88  07/12/12 140/80  06/26/12 148/90  asks for samples benicar hct as she is almost out

## 2013-01-10 NOTE — Progress Notes (Signed)
Subjective:    Patient ID: [name] [name], female    DOB: 1966/06/14, 47 y.o.   MRN: [id number]  HPI   Here with 2-3 days acute onset fever, facial pain, pressure, headache, general weakness and malaise, and greenish d/c, with mild ST and cough, but pt denies chest pain, wheezing, increased sob or doe, orthopnea, PND, increased LE swelling, palpitations, or syncope.  Does have 4 wks ongoing nasal allergy symptoms with clearish congestion, itch and sneezing, without fever, pain, ST, cough, swelling or wheezing.  Has some nausea and occas dizziness.   Pt denies polydipsia, polyuria. .Pt denies new neurological symptoms such as new headache, or facial or extremity weakness or numbness Past Medical History  Diagnosis Date  . Hypertension   . Diverticulitis    Past Surgical History  Procedure Laterality Date  . Colon resection      reports that she has never smoked. She does not have any smokeless tobacco history on file. She reports that she does not drink alcohol or use illicit drugs. family history is not on file. Allergies  Allergen Reactions  . Dilaudid (Hydromorphone Hcl) Nausea Only  . Ibuprofen   . Lisinopril     REACTION: angioedema   Current Outpatient Prescriptions on File Prior to Visit  Medication Sig Dispense Refill  . acyclovir (ZOVIRAX) 400 MG tablet Take 1 tablet (400 mg total) by mouth 3 (three) times daily.  21 tablet  12  . olmesartan (BENICAR) 20 MG tablet Take 20 mg by mouth daily.      Marland Kitchen olmesartan-hydrochlorothiazide (BENICAR HCT) 40-25 MG per tablet Take 1 tablet by mouth daily.  28 tablet  0  . orlistat (XENICAL) 120 MG capsule Take 1 capsule (120 mg total) by mouth 3 (three) times daily with meals.  90 capsule  12  . traMADol (ULTRAM) 50 MG tablet Take 1 tablet (50 mg total) by mouth every 6 (six) hours as needed for pain.  15 tablet  0   No current facility-administered medications on file prior to visit.   Review of Systems  Constitutional: Negative for  unexpected weight change, or unusual diaphoresis  HENT: Negative for tinnitus.   Eyes: Negative for photophobia and visual disturbance.  Respiratory: Negative for choking and stridor.   Gastrointestinal: Negative for vomiting and blood in stool.  Genitourinary: Negative for hematuria and decreased urine volume.  Musculoskeletal: Negative for acute joint swelling Skin: Negative for color change and wound.  Neurological: Negative for tremors and numbness other than noted  Psychiatric/Behavioral: Negative for decreased concentration or  hyperactivity.       Objective:   Physical Exam BP 130/88  Pulse 91  Temp(Src) 98.4 F (36.9 C) (Oral)  Ht 5\' 5"  (1.651 m)  Wt 246 lb 2 oz (111.642 kg)  BMI 40.96 kg/m2  SpO2 97% VS noted, mild ill Constitutional: Pt appears well-developed and well-nourished.  HENT: Head: NCAT.  Right Ear: External ear normal.  Left Ear: External ear normal.  Bilat tm's with mild erythema.  Max sinus areas mild tender.  Pharynx with mild erythema, no exudate Eyes: Conjunctivae and EOM are normal. Pupils are equal, round, and reactive to light.  Neck: Normal range of motion. Neck supple.  Cardiovascular: Normal rate and regular rhythm.   Pulmonary/Chest: Effort normal and breath sounds normal.  Abd:  Soft, NT, non-distended, + BS Neurological: Pt is alert. Not confused  Skin: Skin is warm. No erythema.  Psychiatric: Pt behavior is normal. Thought content normal.     Assessment &  Plan:

## 2013-01-10 NOTE — Patient Instructions (Addendum)
You had the steroid shot today Please take all new medication as prescribed - the antibiotic, prednisone and phenergan for nausea Please continue all other medications as before You are given the sample benicar HCT today

## 2013-01-10 NOTE — Assessment & Plan Note (Addendum)
Mild to mod, for antibx course,  to f/u any worsening symptoms or concerns, also phenergan prn nausea

## 2013-01-10 NOTE — Assessment & Plan Note (Signed)
Mild to mod, seasonal flare, for depomedrol IM, predpacka sd, otc allegra prn,  to f/u any worsening symptoms or concerns

## 2013-05-05 ENCOUNTER — Ambulatory Visit (INDEPENDENT_AMBULATORY_CARE_PROVIDER_SITE_OTHER): Payer: Self-pay | Admitting: Internal Medicine

## 2013-05-05 ENCOUNTER — Encounter: Payer: Self-pay | Admitting: Internal Medicine

## 2013-05-05 VITALS — BP 158/88 | HR 105 | Temp 99.6°F

## 2013-05-05 DIAGNOSIS — R059 Cough, unspecified: Secondary | ICD-10-CM

## 2013-05-05 DIAGNOSIS — R05 Cough: Secondary | ICD-10-CM

## 2013-05-05 MED ORDER — PROMETHAZINE-CODEINE 6.25-10 MG/5ML PO SYRP: 5.0000 mL | ORAL_SOLUTION | ORAL | Status: DC | PRN

## 2013-05-05 MED ORDER — BENZONATATE 100 MG PO CAPS: 100.0000 mg | ORAL_CAPSULE | Freq: Three times a day (TID) | ORAL | Status: DC | PRN

## 2013-05-05 MED ORDER — SULFAMETHOXAZOLE-TMP DS 800-160 MG PO TABS: 1.0000 | ORAL_TABLET | Freq: Two times a day (BID) | ORAL | Status: DC

## 2013-05-05 NOTE — Progress Notes (Signed)
Subjective:    Patient ID: [name] [name], female    DOB: 07-10-1966, 47 y.o.   MRN: [id number]  HPI Starting last Tuesday, sneezing, eyes running, low grade fever then up 102, non-productive cough, feeling weak and tired, having some nausea, no diarrhea. She does feel slightly short of breath and has cough related chest pain.   Past Medical History  Diagnosis Date  . Hypertension   . Diverticulitis    Past Surgical History  Procedure Laterality Date  . Colon resection     History reviewed. No pertinent family history. History   Social History  . Marital Status: Divorced    Spouse Name: N/A    Number of Children: N/A  . Years of Education: N/A   Occupational History  . Not on file.   Social History Main Topics  . Smoking status: Never Smoker   . Smokeless tobacco: Not on file  . Alcohol Use: No  . Drug Use: No  . Sexual Activity:    Other Topics Concern  . Not on file   Social History Narrative  . No narrative on file    Current Outpatient Prescriptions on File Prior to Visit  Medication Sig Dispense Refill  . acyclovir (ZOVIRAX) 400 MG tablet Take 1 tablet (400 mg total) by mouth 3 (three) times daily.  21 tablet  12  . azithromycin (ZITHROMAX Z-PAK) 250 MG tablet Use as directed  6 each  1  . olmesartan (BENICAR) 20 MG tablet Take 20 mg by mouth daily.      Marland Kitchen olmesartan-hydrochlorothiazide (BENICAR HCT) 40-25 MG per tablet Take 1 tablet by mouth daily.  28 tablet  0  . orlistat (XENICAL) 120 MG capsule Take 1 capsule (120 mg total) by mouth 3 (three) times daily with meals.  90 capsule  12  . predniSONE (DELTASONE) 10 MG tablet 3 tabs by mouth per day for 3 days,2tabs per day for 3 days,1tab per day for 3 days  18 tablet  0  . promethazine (PHENERGAN) 25 MG tablet Take 1 tablet (25 mg total) by mouth every 6 (six) hours as needed for nausea.  30 tablet  0  . traMADol (ULTRAM) 50 MG tablet Take 1 tablet (50 mg total) by mouth every 6 (six) hours as needed for  pain.  15 tablet  0   No current facility-administered medications on file prior to visit.      Review of Systems System review is negative for any constitutional, cardiac, pulmonary, GI or neuro symptoms or complaints other than as described in the HPI.       Objective:   Physical Exam Filed Vitals:   05/05/13 1505  BP: 158/88  Pulse: 105  Temp: 99.6 F (37.6 C)   Wt Readings from Last 3 Encounters:  01/10/13 246 lb 2 oz (111.642 kg)  03/19/10 209 lb (94.802 kg)  03/07/10 208 lb (94.348 kg)   Gen'l- overweight woman in no distress HEENT - very tender to percussion over the frontal sinuses Neck supple Nodes - negative Cor- Reg tachycardia Pulm - normal breath sounds w/o rales or wheezes Neuro - A&O x 3.          Assessment & Plan:  Cough with fever - could be ILI but this start more than 72 hours ago so anti-flu medication is not indicated. May be atypical URI.  Plan Septra DS bid for 7 days  Phenergan/codeine cough syrup 1 tsp every 6 hrs  Tylenol 500 mg every 4 hours for  fever and aches.   Tessalon perles 100 mg three times a day  Hydrate.  Vitamin C  Stay away from others, especially elderly or immunocompromised, e.g. Diabetic, patients being treated  Call for any increased Shortness of breath or fevers that do not come down with tylenol

## 2013-05-05 NOTE — Patient Instructions (Addendum)
Cough with fever - could be ILI but this start more than 72 hours ago so anti-flu medication is not indicated. May be atypical URI.  Plan Septra DS bid for 7 days  Phenergan/codeine cough syrup 1 tsp every 6 hrs  Tylenol 500 mg every 4 hours for fever and aches.   Tessalon perles 100 mg three times a day  Hydrate.  Vitamin C  Stay away from others, especially elderly or immunocompromised, e.g. Diabetic, patients being treated  Call for any increased Shortness of breath or fevers that do not come down with tylenol

## 2013-07-02 ENCOUNTER — Ambulatory Visit (INDEPENDENT_AMBULATORY_CARE_PROVIDER_SITE_OTHER): Payer: Self-pay | Admitting: Internal Medicine

## 2013-07-02 ENCOUNTER — Encounter: Payer: Self-pay | Admitting: Internal Medicine

## 2013-07-02 VITALS — BP 138/78 | HR 100 | Temp 99.1°F

## 2013-07-02 DIAGNOSIS — M7122 Synovial cyst of popliteal space [Baker], left knee: Secondary | ICD-10-CM

## 2013-07-02 DIAGNOSIS — M7120 Synovial cyst of popliteal space [Baker], unspecified knee: Secondary | ICD-10-CM

## 2013-07-02 DIAGNOSIS — J029 Acute pharyngitis, unspecified: Secondary | ICD-10-CM

## 2013-07-02 MED ORDER — PROMETHAZINE-CODEINE 6.25-10 MG/5ML PO SYRP: 5.0000 mL | ORAL_SOLUTION | ORAL | Status: DC | PRN

## 2013-07-02 MED ORDER — AMOXICILLIN 875 MG PO TABS: 875.0000 mg | ORAL_TABLET | Freq: Two times a day (BID) | ORAL | Status: DC

## 2013-07-02 NOTE — Progress Notes (Signed)
Subjective:    Patient ID: ELEN ACERO, female    DOB: 08-21-1966, 47 y.o.   MRN: [id number]  HPI Ms. Benedicto presents for evaluation of sore throat. Started Sunday - was a scratch and it is now very painful, odynophagia, fever, neck hurts. She has also been coughing up thick phlegm. She has not taken any cough medication or gargles for her throat.    Left knee has been hurting for a week and there is swelling in the popliteal fossa. She has been walking on concrete at her new job. By the end of the day she cannot walk. She has tried heat and cold w/o relief.   Past Medical History  Diagnosis Date  . Hypertension   . Diverticulitis    Past Surgical History  Procedure Laterality Date  . Colon resection     History reviewed. No pertinent family history. History   Social History  . Marital Status: Divorced    Spouse Name: N/A    Number of Children: N/A  . Years of Education: N/A   Occupational History  . Not on file.   Social History Main Topics  . Smoking status: Never Smoker   . Smokeless tobacco: Not on file  . Alcohol Use: No  . Drug Use: No  . Sexual Activity:    Other Topics Concern  . Not on file   Social History Narrative  . No narrative on file    Current Outpatient Prescriptions on File Prior to Visit  Medication Sig Dispense Refill  . acyclovir (ZOVIRAX) 400 MG tablet Take 1 tablet (400 mg total) by mouth 3 (three) times daily.  21 tablet  12  . azithromycin (ZITHROMAX Z-PAK) 250 MG tablet Use as directed  6 each  1  . benzonatate (TESSALON) 100 MG capsule Take 1 capsule (100 mg total) by mouth 3 (three) times daily as needed for cough.  30 capsule  1  . olmesartan (BENICAR) 20 MG tablet Take 20 mg by mouth daily.      Marland Kitchen olmesartan-hydrochlorothiazide (BENICAR HCT) 40-25 MG per tablet Take 1 tablet by mouth daily.  28 tablet  0  . orlistat (XENICAL) 120 MG capsule Take 1 capsule (120 mg total) by mouth 3 (three) times daily with meals.  90 capsule  12  .  predniSONE (DELTASONE) 10 MG tablet 3 tabs by mouth per day for 3 days,2tabs per day for 3 days,1tab per day for 3 days  18 tablet  0  . promethazine (PHENERGAN) 25 MG tablet Take 1 tablet (25 mg total) by mouth every 6 (six) hours as needed for nausea.  30 tablet  0  . promethazine-codeine (PHENERGAN WITH CODEINE) 6.25-10 MG/5ML syrup Take 5 mLs by mouth every 4 (four) hours as needed for cough.  120 mL  0  . sulfamethoxazole-trimethoprim (BACTRIM DS) 800-160 MG per tablet Take 1 tablet by mouth 2 (two) times daily.  14 tablet  0  . traMADol (ULTRAM) 50 MG tablet Take 1 tablet (50 mg total) by mouth every 6 (six) hours as needed for pain.  15 tablet  0   No current facility-administered medications on file prior to visit.     Review of Systems System review is negative for any constitutional, cardiac, pulmonary, GI or neuro symptoms or complaints other than as described in the HPI.     Objective:   Physical Exam Filed Vitals:   07/02/13 1608  BP: 138/78  Pulse: 100  Temp: 99.1 F (37.3 C)  Wt Readings from Last 3 Encounters:  01/10/13 246 lb 2 oz (111.642 kg)  03/19/10 209 lb (94.802 kg)  03/07/10 208 lb (94.348 kg)   BP Readings from Last 3 Encounters:  07/02/13 138/78  05/05/13 158/88  01/10/13 130/88   Gen'l- overweight woman with cough HEENT_ C&S clear, throat with erythema but no frank pus, neck is tender Nodes - submandibular nodes Chest- Clear Cor - RRR Ext - left knee with popliteal swelling, normal ROM, no tenderness to palpation along the joint line.       Assessment & Plan:  1. Sore throat - may be a bacterial sore throat Plan Amoxicillin twice a day for 7 days  Phenergan with codeinn cough syrup every 4 hours as needed  Hydrate  Tylenol (generic) 500 mg every 6 hours for fever, pain.  2. Knee pain - joint moves well. Looks like fluid behind the knee = Baker's cyst. Usually due to arthritis of the knee or tendonitis. Plan Good supportive shoes at  work  Meloxicam 15 mg once a day or Naproxen sodium 220 mg 2 tablets twice a day - go with the one that is cheaper. Watch for stomach irritation

## 2013-07-02 NOTE — Patient Instructions (Signed)
1. Sore throat - may be a bacterial sore throat Plan Amoxicillin twice a day for 7 days  Phenergan with codeinn cough syrup every 4 hours as needed  Hydrate  Tylenol (generic) 500 mg every 6 hours for fever, pain.  2. Knee pain - joint moves well. Looks like fluid behind the knee = Baker's cyst. Usually due to arthritis of the knee or tendonitis. Plan Good supportive shoes at work  Meloxicam 15 mg once a day or Naproxen sodium 220 mg 2 tablets twice a day - go with the one that is cheaper. Watch for stomach irritation     Baker's Cyst A Baker's cyst is a swelling that forms in the back of the knee. It is a sac-like structure. It is filled with the same fluid that is located in your knee. The fluid located in your knee is necessary because it lubricates the bones and cartilage. It allows them to move over each other more easily. CAUSES  When the knee becomes injured or has soreness (inflammation) present, more fluid forms in the knee. When this happens, the joint lining is pushed out behind the knee and forms the baker's cyst. This cyst may also be caused by inflammation from arthritic conditions and infections. DIAGNOSIS  A Baker's cyst is most often diagnosed with an ultrasound. This is a specialized picture (like an X-ray). It shows a picture by using sound waves. Sometimes a specialized x-ray called an MRI (magnetic resonance imaging) is used. This picks up other problems within a joint if an ultrasound alone cannot make the diagnosis. If the cyst came immediately following an injury, plain x-rays may be used to make a diagnosis. TREATMENT  The treatment depends on the cause of the cyst. But most of these cysts are caused by an inflammation. Anti-inflammatory medications and rest often will get rid of the problem. If the cyst is caused by an infection, medications (antibiotics) will be prescribed to help this. Take the medications as directed. Refer to Home Care Instructions, below, for  additional treatment suggestions. HOME CARE INSTRUCTIONS   If the cyst was caused by an injury, for the first 24 hours, while lying down, keep the injured extremity elevated on 2 pillows.  For the first 24 hours while you are awake, apply ice bags (ice in a plastic bag with a towel around it to prevent frostbite to skin) 03-04 times per day for 15-20 minutes to the injured area. Then do as directed by your caregiver.  Only take over-the-counter or prescription medicines for pain, discomfort, or fever as directed by your caregiver. Persistent pain and inability to use the injured area for more than 2 to 3 days are warning signs indicating that you should see a caregiver for a follow-up visit as soon as possible. Persistent pain and swelling indicate that further evaluation, non-weight bearing (use of crutches as instructed), and/or further x-rays are needed. Make a follow-up appointment with your own caregiver. If conservative measures (rest, medications and inactivity) do not help the problem get better, sometimes surgery for removal of the cyst is needed. Reasons for this may be that the cyst is pressing on nerves and/or vessels and causing problems which cannot wait for improvement with conservative treatment. If the problem is caused by injuries to the cartilage in the knee, surgery is often needed for treatment of that problem. MAKE SURE YOU:   Understand these instructions.  Will watch your condition.  Will get help right away if you are not doing well  or get worse. Document Released: 08/07/2005 Document Revised: 10/30/2011 Document Reviewed: 03/19/2013 [facility] Patient Information 2014 [place], [place].   Viral and Bacterial Pharyngitis Pharyngitis is a sore throat. It is an infection of the back of the throat (pharynx). HOME CARE   Only take medicine as told by your doctor. You may get sick again if you do not take medicine as told.  Drink enough fluids to keep your pee (urine) clear  or pale yellow.  Rest.  Rinse your mouth (gargle) with salt water ( teaspoon of salt in 8 ounces of water) every 1 to 2 hours. This will help the pain.  For children over the age of 7, suck on hard candy or sore throat lozenges. GET HELP RIGHT AWAY IF:   There are large, tender lumps in your neck.  You have a rash.  You cough up green, yellow-brown, or bloody mucus.  You have a stiff neck.  There is redness, puffiness (swelling), or very bad pain anywhere on the neck.  You drool or are unable to swallow liquids.  You throw up (vomit) or are not able to keep medicine or liquids down.  You have very bad pain that will not stop with medicine.  You have problems breathing (not from a stuffy nose).  You cannot open your mouth completely.  You or your child has a temperature by mouth above 102 F (38.9 C), not controlled by medicine.  Your baby is older than 3 months with a rectal temperature of 102 F (38.9 C) or higher.  Your baby is 13 months old or younger with a rectal temperature of 100.4 F (38 C) or higher. MAKE SURE YOU:   Understand these instructions.  Will watch this condition.  Will get help right away if you or your child is not doing well or gets worse. Document Released: 01/24/2008 Document Revised: 10/30/2011 Document Reviewed: 09/06/2009 [facility] Patient Information 2014 [place], [place].

## 2013-07-02 NOTE — Progress Notes (Signed)
Pre visit review using our clinic review tool, if applicable. No additional management support is needed unless otherwise documented below in the visit note. 

## 2013-07-11 ENCOUNTER — Telehealth: Payer: Self-pay | Admitting: Internal Medicine

## 2013-07-11 NOTE — Telephone Encounter (Signed)
Pt was seen last Wed Nov 12.  She is still coughing and can't sleep at night.  She has finished the antibiotics and cough syrup.  She wants to know if they can be refilled.  Pharmacy is [facility] on [address].  Pt did not want to come in due to no insurance.

## 2013-07-14 NOTE — Telephone Encounter (Signed)
If no fever or rigors, if no severe throat pain there is no indication for repeat antibiotics.  For persistent cough she may have refill on phenergan with codeine

## 2013-07-14 NOTE — Telephone Encounter (Signed)
Numbers aren't working.  Sent an e-mail to have her call.

## 2013-08-22 ENCOUNTER — Emergency Department (HOSPITAL_COMMUNITY): Payer: Self-pay

## 2013-08-22 ENCOUNTER — Emergency Department (HOSPITAL_COMMUNITY)
Admission: EM | Admit: 2013-08-22 | Discharge: 2013-08-22 | Disposition: A | Discharge status: Home / Self Care | Payer: Self-pay | Attending: Emergency Medicine | Admitting: Emergency Medicine

## 2013-08-22 ENCOUNTER — Encounter (HOSPITAL_COMMUNITY): Payer: Self-pay | Admitting: Emergency Medicine

## 2013-08-22 DIAGNOSIS — Z8719 Personal history of other diseases of the digestive system: Secondary | ICD-10-CM | POA: Insufficient documentation

## 2013-08-22 DIAGNOSIS — Z79899 Other long term (current) drug therapy: Secondary | ICD-10-CM | POA: Insufficient documentation

## 2013-08-22 DIAGNOSIS — I10 Essential (primary) hypertension: Secondary | ICD-10-CM | POA: Insufficient documentation

## 2013-08-22 DIAGNOSIS — M25462 Effusion, left knee: Secondary | ICD-10-CM

## 2013-08-22 DIAGNOSIS — M25469 Effusion, unspecified knee: Secondary | ICD-10-CM | POA: Insufficient documentation

## 2013-08-22 DIAGNOSIS — Z792 Long term (current) use of antibiotics: Secondary | ICD-10-CM | POA: Insufficient documentation

## 2013-08-22 DIAGNOSIS — IMO0002 Reserved for concepts with insufficient information to code with codable children: Secondary | ICD-10-CM | POA: Insufficient documentation

## 2013-08-22 MED ORDER — TRAMADOL HCL 50 MG PO TABS
50.0000 mg | ORAL_TABLET | Freq: Once | ORAL | Status: AC
  Administered 2013-08-22: 50 mg via ORAL
  Filled 2013-08-22: qty 1

## 2013-08-22 MED ORDER — TRAMADOL HCL 50 MG PO TABS: 50.0000 mg | ORAL_TABLET | Freq: Four times a day (QID) | ORAL | Status: DC | PRN

## 2013-08-22 NOTE — Discharge Instructions (Signed)
Take Tramdol as needed for pain. Follow up with Dr. Marcelino Scot, Orthopedic doctor, as needed for further evaluation. Rest, ice and elevate the affected knee. Refer to attached documents for more information.

## 2013-08-22 NOTE — ED Provider Notes (Signed)
CSN: [id number]     Arrival date & time 08/22/13  2041 History  This chart was scribed for non-physician practitioner Alvina Chou, PA-C working with Tanna Furry, MD by Eston Mould, ED Scribe. This patient was seen in room [place] and the patient's care was started at 10:15 PM .   Chief Complaint  Patient presents with  . Knee Pain   The history is provided by the patient. No language interpreter was used.   HPI Comments: [name] [name] is a 48 y.o. female who presents to the Emergency Department complaining of ongoing L knee pain that began 2 months ago. Pt states the pain initially began in the back of her L knee and is now having constant knee pain that radiates down her L leg to her ankle. She states she has a burning sensation underneath her skin. Pt states her pain has worsened and reports limited ROM. She states she has not had adequate sleep due to her knee pain. Pt states she has had varicose veins to L thigh and states she has had a procedure done to remove the veins. Pt denies any recent injuries, travels, and hx of DVT and GOUT. Pt denies fever.   Past Medical History  Diagnosis Date  . Hypertension   . Diverticulitis    Past Surgical History  Procedure Laterality Date  . Colon resection     No family history on file. History  Substance Use Topics  . Smoking status: Never Smoker   . Smokeless tobacco: Not on file  . Alcohol Use: No   OB History   Grav Para Term Preterm Abortions TAB SAB Ect Mult Living                 Review of Systems  Constitutional: Negative for fever.  Musculoskeletal: Positive for gait problem.  All other systems reviewed and are negative.   Allergies  Dilaudid; Ibuprofen; and Lisinopril  Home Medications   Current Outpatient Rx  Name  Route  Sig  Dispense  Refill  . acyclovir (ZOVIRAX) 400 MG tablet   Oral   Take 1 tablet (400 mg total) by mouth 3 (three) times daily.   21 tablet   12   . amoxicillin (AMOXIL)  875 MG tablet   Oral   Take 1 tablet (875 mg total) by mouth 2 (two) times daily.   14 tablet   0   . azithromycin (ZITHROMAX Z-PAK) 250 MG tablet      Use as directed   6 each   1   . benzonatate (TESSALON) 100 MG capsule   Oral   Take 1 capsule (100 mg total) by mouth 3 (three) times daily as needed for cough.   30 capsule   1   . olmesartan (BENICAR) 20 MG tablet   Oral   Take 20 mg by mouth daily.         Marland Kitchen olmesartan-hydrochlorothiazide (BENICAR HCT) 40-25 MG per tablet   Oral   Take 1 tablet by mouth daily.   28 tablet   0   . orlistat (XENICAL) 120 MG capsule   Oral   Take 1 capsule (120 mg total) by mouth 3 (three) times daily with meals.   90 capsule   12   . predniSONE (DELTASONE) 10 MG tablet      3 tabs by mouth per day for 3 days,2tabs per day for 3 days,1tab per day for 3 days   18 tablet   0   .  promethazine (PHENERGAN) 25 MG tablet   Oral   Take 1 tablet (25 mg total) by mouth every 6 (six) hours as needed for nausea.   30 tablet   0   . promethazine-codeine (PHENERGAN WITH CODEINE) 6.25-10 MG/5ML syrup   Oral   Take 5 mLs by mouth every 4 (four) hours as needed for cough.   120 mL   0   . promethazine-codeine (PHENERGAN WITH CODEINE) 6.25-10 MG/5ML syrup   Oral   Take 5 mLs by mouth every 4 (four) hours as needed for cough.   180 mL   0   . sulfamethoxazole-trimethoprim (BACTRIM DS) 800-160 MG per tablet   Oral   Take 1 tablet by mouth 2 (two) times daily.   14 tablet   0   . traMADol (ULTRAM) 50 MG tablet   Oral   Take 1 tablet (50 mg total) by mouth every 6 (six) hours as needed for pain.   15 tablet   0    BP 171/83  Pulse 94  Temp(Src) 98.3 F (36.8 C)  Resp 20  Wt 240 lb (108.863 kg)  SpO2 100%  LMP 08/11/2013  Physical Exam  Nursing note and vitals reviewed. Constitutional: She is oriented to person, place, and time. She appears well-developed and well-nourished. No distress.  HENT:  Head: Normocephalic and  atraumatic.  Eyes: EOM are normal.  Neck: Neck supple. No tracheal deviation present.  Cardiovascular: Normal rate.   Pulses intact.  Pulmonary/Chest: Effort normal. No respiratory distress.  Musculoskeletal:       Left knee: She exhibits decreased range of motion, swelling and effusion. She exhibits no ecchymosis, no deformity, no laceration, no erythema, normal alignment, no bony tenderness and normal meniscus. Tenderness found. No medial joint line, no lateral joint line and no patellar tendon tenderness noted.  Positive bulge sign of the L knee.   Neurological: She is alert and oriented to person, place, and time.  Skin: Skin is warm and dry.  Psychiatric: She has a normal mood and affect. Her behavior is normal.    ED Course  ARTHOCENTESIS Date/Time: 08/22/2013 10:39 PM Performed by: Emilia Beck Authorized by: Emilia Beck Consent: Verbal consent obtained. Risks and benefits: risks, benefits and alternatives were discussed Consent given by: patient Patient understanding: patient states understanding of the procedure being performed Patient consent: the patient's understanding of the procedure matches consent given Patient identity confirmed: verbally with patient Indications: joint swelling and pain  Body area: knee Joint: left knee Local anesthesia used: yes Anesthesia: local infiltration Local anesthetic: lidocaine 2% with epinephrine Anesthetic total: 8 ml Patient sedated: no Preparation: Patient was prepped and draped in the usual sterile fashion. Needle gauge: 18 G Approach: medial Aspirate: yellow Aspirate amount: 60 ml Patient tolerance: Patient tolerated the procedure well with no immediate complications.    DIAGNOSTIC STUDIES: Oxygen Saturation is 100% on RA, normal by my interpretation.    COORDINATION OF CARE: 10:18 PM-Discussed treatment plan which includes L knee effusion. Pt agreed to plan.   10:22 PM-L knee effusion procedure.   10:31  PM- Advised pt to elevate L knee and will administer pain medication while in ED. Will D/C pt with pain medications.  Labs Review Labs Reviewed - No data to display Imaging Review Dg Knee 2 Views Left  08/22/2013   CLINICAL DATA:  Left knee pain and swelling.  EXAM: LEFT KNEE - 1-2 VIEW  COMPARISON:  None.  FINDINGS: Mild tricompartmental degenerative changes most notable in the medial  compartment with early joint space narrowing and osteophytic spurring. No acute bony findings or osteochondral abnormality. There is a moderate to large joint effusion.  IMPRESSION: Mild tricompartmental degenerative changes.  No acute bony findings.  Moderate to large joint effusion.   Electronically Signed   By: Kalman Jewels M.D.   On: 08/22/2013 21:31    EKG Interpretation   None      MDM   1. Knee effusion, left     10:37 PM Patient's knee aspirated without complications. Aspiration performed for therapeutic purposes. Fluids will be sent for analysis. Patient will be discharged with pain medication and instructions to rest, ice and elevate knee. I doubt septic joint at this time. Vitals stable and patient afebrile. Patient will be referred to Orthopedist for follow up as needed.   I personally performed the services described in this documentation, which was scribed in my presence. The recorded information has been reviewed and is accurate.   Alvina Chou, PA-C 08/22/13 2244

## 2013-08-22 NOTE — ED Notes (Signed)
Pt ambulatory to exam room with steady gait.  

## 2013-08-22 NOTE — ED Notes (Signed)
Pt c/o left knee swelling x 2 months; burning pain

## 2013-08-23 LAB — SYNOVIAL CELL COUNT + DIFF, W/ CRYSTALS
Crystals, Fluid: NONE SEEN
Eosinophils-Synovial: 0 % (ref 0–1)
Lymphocytes-Synovial Fld: 90 % — ABNORMAL HIGH (ref 0–20)
Monocyte-Macrophage-Synovial Fluid: 5 % — ABNORMAL LOW (ref 50–90)
Neutrophil, Synovial: 5 % (ref 0–25)
WBC, Synovial: 190 /mm3 (ref 0–200)

## 2013-08-23 LAB — GRAM STAIN: Special Requests: NORMAL

## 2013-08-23 LAB — GLUCOSE, SYNOVIAL FLUID: Glucose, Synovial Fluid: 98 mg/dL

## 2013-08-24 ENCOUNTER — Encounter (HOSPITAL_COMMUNITY): Payer: Self-pay | Admitting: Emergency Medicine

## 2013-08-24 ENCOUNTER — Emergency Department (HOSPITAL_COMMUNITY)
Admission: EM | Admit: 2013-08-24 | Discharge: 2013-08-24 | Disposition: A | Discharge status: Home / Self Care | Payer: Self-pay | Attending: Emergency Medicine | Admitting: Emergency Medicine

## 2013-08-24 DIAGNOSIS — Z79899 Other long term (current) drug therapy: Secondary | ICD-10-CM | POA: Insufficient documentation

## 2013-08-24 DIAGNOSIS — I10 Essential (primary) hypertension: Secondary | ICD-10-CM | POA: Insufficient documentation

## 2013-08-24 DIAGNOSIS — Z8719 Personal history of other diseases of the digestive system: Secondary | ICD-10-CM | POA: Insufficient documentation

## 2013-08-24 DIAGNOSIS — M25469 Effusion, unspecified knee: Secondary | ICD-10-CM | POA: Insufficient documentation

## 2013-08-24 DIAGNOSIS — M25462 Effusion, left knee: Secondary | ICD-10-CM

## 2013-08-24 MED ORDER — TRIAMCINOLONE ACETONIDE 40 MG/ML IJ SUSP
40.0000 mg | Freq: Once | INTRAMUSCULAR | Status: AC
  Administered 2013-08-24: 40 mg via INTRAMUSCULAR
  Filled 2013-08-24: qty 1

## 2013-08-24 MED ORDER — BUPIVACAINE HCL (PF) 0.25 % IJ SOLN
10.0000 mL | Freq: Once | INTRAMUSCULAR | Status: AC
  Administered 2013-08-24: 10 mL
  Filled 2013-08-24: qty 30

## 2013-08-24 NOTE — Discharge Instructions (Signed)
CONTINUE YOUR PAIN MEDICATIONS AS NEEDED. ICE AND ELEVATE THE KNEE. FOLLOW UP AS PLANNED WITH ORTHOPEDICS AND RETURN HERE AS NEEDED.   Knee Effusion  Knee effusion means you have fluid in your knee. The knee may be more difficult to bend and move. HOME CARE  Use crutches or a brace as told by your doctor.  Put ice on the injured area.  Put ice in a plastic bag.  Place a towel between your skin and the bag.  Leave the ice on for 15-20 minutes, 03-04 times a day.  Raise (elevate) your knee as much as possible.  Only take medicine as told by your doctor.  You may need to do strengthening exercises. Ask your doctor.  Continue with your normal diet and activities as told by your doctor. GET HELP RIGHT AWAY IF:  You have more puffiness (swelling) in your knee.  You see redness, puffiness, or have more pain in your knee.  You have a temperature by mouth above 102 F (38.9 C).  You get a rash.  You have trouble breathing.  You have a reaction to any medicine you are taking.  You have a lot of pain when you move your knee. MAKE SURE YOU:  Understand these instructions.  Will watch your condition.  Will get help right away if you are not doing well or get worse. Document Released: 09/09/2010 Document Revised: 10/30/2011 Document Reviewed: 09/09/2010 [facility] Patient Information 2014 [place], [place].  Joint Injection Care After Refer to this sheet in the next few days. These instructions provide you with information on caring for yourself after you have had a joint injection. Your caregiver also may give you more specific instructions. Your treatment has been planned according to current medical practices, but problems sometimes occur. Call your caregiver if you have any problems or questions after your procedure. After any type of joint injection, it is not uncommon to experience:  Soreness, swelling, or bruising around the injection site.  Mild numbness, tingling, or  weakness around the injection site caused by the numbing medicine used before or with the injection. It also is possible to experience the following effects associated with the specific agent after injection:  Iodine-based contrast agents:  Allergic reaction (itching, hives, widespread redness, and swelling beyond the injection site).  Corticosteroids (These effects are rare.):  Allergic reaction.  Increased blood sugar levels (If you have diabetes and you notice that your blood sugar levels have increased, notify your caregiver).  Increased blood pressure levels.  Mood swings.  Hyaluronic acid in the use of viscosupplementation.  Temporary heat or redness.  Temporary rash and itching.  Increased fluid accumulation in the injected joint. These effects all should resolve within a day after your procedure.  HOME CARE INSTRUCTIONS  Limit yourself to light activity the day of your procedure. Avoid lifting heavy objects, bending, stooping, or twisting.  Take prescription or over-the-counter pain medication as directed by your caregiver.  You may apply ice to your injection site to reduce pain and swelling the day of your procedure. Ice may be applied 03-04 times:  Put ice in a plastic bag.  Place a towel between your skin and the bag.  Leave the ice on for no longer than 15-20 minutes each time. SEEK IMMEDIATE MEDICAL CARE IF:   Pain and swelling get worse rather than better or extend beyond the injection site.  Numbness does not go away.  Blood or fluid continues to leak from the injection site.  You have  chest pain.  You have swelling of your face or tongue.  You have trouble breathing or you become dizzy.  You develop a fever, chills, or severe tenderness at the injection site that last longer than 1 day. MAKE SURE YOU:  Understand these instructions.  Watch your condition.  Get help right away if you are not doing well or if you get worse. Document Released:  04/20/2011 Document Revised: 10/30/2011 Document Reviewed: 04/20/2011 [facility] Patient Information 2014 [place].

## 2013-08-24 NOTE — ED Provider Notes (Signed)
Medical screening examination/treatment/procedure(s) were performed by non-physician practitioner and as supervising physician I was immediately available for consultation/collaboration.  EKG Interpretation   None        Virgel Manifold, MD 08/24/13 1810

## 2013-08-24 NOTE — ED Notes (Signed)
Pt states she was here Friday for L knee pain. Pt had fluid asperated from knee at that time. Pt states swelling has returned and pain is worse than Friday. Pt ambulatory, L knee swollen

## 2013-08-24 NOTE — ED Provider Notes (Signed)
CSN: [id number]     Arrival date & time 08/24/13  1021 History   First MD Initiated Contact with Patient 08/24/13 1029     Chief Complaint  Patient presents with  . Knee Pain   (Consider location/radiation/quality/duration/timing/severity/associated sxs/prior Treatment) Patient is a 48 y.o. female presenting with knee pain. The history is provided by the patient. No language interpreter was used.  Knee Pain Location:  Knee Associated symptoms: no fever   Associated symptoms comment:  Painful swollen left knee. She was seen 3 days ago for same and had arthrocentesis performed in the ED with relief of pain. She returns today with recurrent pain and swelling. No known injury. No history of the same. No redness or fever.    Past Medical History  Diagnosis Date  . Hypertension   . Diverticulitis    Past Surgical History  Procedure Laterality Date  . Colon resection     History reviewed. No pertinent family history. History  Substance Use Topics  . Smoking status: Never Smoker   . Smokeless tobacco: Not on file  . Alcohol Use: No   OB History   Grav Para Term Preterm Abortions TAB SAB Ect Mult Living                 Review of Systems  Constitutional: Negative for fever and chills.  Respiratory: Negative.   Cardiovascular: Negative.   Gastrointestinal: Negative.   Musculoskeletal:       See HPI.  Skin: Negative.   Neurological: Negative.     Allergies  Dilaudid; Ibuprofen; and Lisinopril  Home Medications   Current Outpatient Rx  Name  Route  Sig  Dispense  Refill  . aspirin 325 MG EC tablet   Oral   Take 325 mg by mouth daily as needed for pain.         Marland Kitchen olmesartan-hydrochlorothiazide (BENICAR HCT) 40-25 MG per tablet   Oral   Take 1 tablet by mouth daily.   28 tablet   0   . traMADol (ULTRAM) 50 MG tablet   Oral   Take 1 tablet (50 mg total) by mouth every 6 (six) hours as needed.   15 tablet   0    BP 162/82  Pulse 88  Temp(Src) 99.9 F (37.7 C)  (Oral)  Resp 20  SpO2 96%  LMP 08/11/2013 Physical Exam  Constitutional: She is oriented to person, place, and time. She appears well-developed and well-nourished.  Neck: Normal range of motion.  Pulmonary/Chest: Effort normal.  Musculoskeletal:  Left knee moderately swollen. No redness or warmth. FROM with pain. Fully weight bearing.   Neurological: She is alert and oriented to person, place, and time.  Skin: Skin is warm and dry.  Psychiatric: She has a normal mood and affect.    ED Course  Injection of joint Date/Time: 08/24/2013 4:42 PM Performed by: Charlann Lange A Authorized by: Charlann Lange A Consent: Verbal consent obtained. written consent not obtained. Risks and benefits: risks, benefits and alternatives were discussed Consent given by: patient Patient identity confirmed: verbally with patient Preparation: Patient was prepped and draped in the usual sterile fashion. Local anesthesia used: yes Anesthesia: local infiltration Local anesthetic: bupivacaine 0.25% with epinephrine Patient sedated: no Patient tolerance: Patient tolerated the procedure well with no immediate complications. Comments: Arthrocentesis performed, approximately 15 cc slightly bloody synovial fluid aspirated. 40 mg Kenalog with 1 cc bupivicaine injected into knee joint.   (including critical care time) Labs Review Labs Reviewed - No data to  display Imaging Review Dg Knee 2 Views Left  08/22/2013   CLINICAL DATA:  Left knee pain and swelling.  EXAM: LEFT KNEE - 1-2 VIEW  COMPARISON:  None.  FINDINGS: Mild tricompartmental degenerative changes most notable in the medial compartment with early joint space narrowing and osteophytic spurring. No acute bony findings or osteochondral abnormality. There is a moderate to large joint effusion.  IMPRESSION: Mild tricompartmental degenerative changes.  No acute bony findings.  Moderate to large joint effusion.   Electronically Signed   By: Kalman Jewels M.D.    On: 08/22/2013 21:31    EKG Interpretation   None       MDM  No diagnosis found. 1. Knee effusion, left  Review of patient's chart shows synovial fluid analysis negative for crystals, or any growth on culture. Will attempt repeat arthrocentesis with steroid injection.    Dewaine Oats, PA-C 08/24/13 1644

## 2013-08-26 LAB — BODY FLUID CULTURE
Culture: NO GROWTH
Special Requests: NORMAL

## 2013-09-01 NOTE — ED Provider Notes (Signed)
Medical screening examination/treatment/procedure(s) were performed by non-physician practitioner and as supervising physician I was immediately available for consultation/collaboration.  EKG Interpretation   None         Tanna Furry, MD 09/01/13 [phone]

## 2013-09-08 ENCOUNTER — Encounter: Payer: Self-pay | Admitting: Internal Medicine

## 2013-09-08 ENCOUNTER — Other Ambulatory Visit (INDEPENDENT_AMBULATORY_CARE_PROVIDER_SITE_OTHER): Payer: Self-pay

## 2013-09-08 ENCOUNTER — Ambulatory Visit (INDEPENDENT_AMBULATORY_CARE_PROVIDER_SITE_OTHER): Payer: Self-pay | Admitting: Internal Medicine

## 2013-09-08 VITALS — BP 154/100 | HR 78 | Temp 98.4°F | Wt 238.8 lb

## 2013-09-08 DIAGNOSIS — Z0000 Encounter for general adult medical examination without abnormal findings: Secondary | ICD-10-CM

## 2013-09-08 DIAGNOSIS — M25562 Pain in left knee: Secondary | ICD-10-CM

## 2013-09-08 DIAGNOSIS — M25569 Pain in unspecified knee: Secondary | ICD-10-CM

## 2013-09-08 DIAGNOSIS — M25561 Pain in right knee: Secondary | ICD-10-CM | POA: Insufficient documentation

## 2013-09-08 DIAGNOSIS — I10 Essential (primary) hypertension: Secondary | ICD-10-CM

## 2013-09-08 DIAGNOSIS — B372 Candidiasis of skin and nail: Secondary | ICD-10-CM

## 2013-09-08 LAB — BASIC METABOLIC PANEL
BUN: 10 mg/dL (ref 6–23)
CO2: 30 mEq/L (ref 19–32)
Calcium: 9.1 mg/dL (ref 8.4–10.5)
Chloride: 104 mEq/L (ref 96–112)
Creatinine, Ser: 0.9 mg/dL (ref 0.4–1.2)
GFR: 81.07 mL/min (ref >60.00)
Glucose, Bld: 99 mg/dL (ref 70–99)
Potassium: 3.8 mEq/L (ref 3.5–5.1)
Sodium: 140 mEq/L (ref 135–145)

## 2013-09-08 LAB — LIPID PANEL
Cholesterol: 180 mg/dL (ref 0–200)
HDL: 40.5 mg/dL (ref >39.00)
LDL Cholesterol: 120 mg/dL — ABNORMAL HIGH (ref 0–99)
Total CHOL/HDL Ratio: 4
Triglycerides: 97 mg/dL (ref 0.0–149.0)
VLDL: 19.4 mg/dL (ref 0.0–40.0)

## 2013-09-08 MED ORDER — OLMESARTAN-AMLODIPINE-HCTZ 40-10-25 MG PO TABS: 1.0000 | ORAL_TABLET | Freq: Every day | ORAL | Status: DC

## 2013-09-08 NOTE — Progress Notes (Signed)
   Subjective:    Patient ID: [name] [name], female    DOB: [birth year], 48 y.o.   MRN: [id number]  HPI [name] [name] presents for follow up:  1. She has been seen in ED x 2 for effusion about the left knee - she had arthrocentesis x 2 (60cc and 15 cc) and cultures were sent. X-ray with tricompartmental inflammation with effusion. At the second visit she had a steroid injection that did not help. The fluid analysis was negative for bacteria or crystal.  She continues to have trouble with pain and swelling in both knees. She has continued with tramadol for pain and meloxicam. 2. She has poor control of her blood pressure despite taking Benicar/HCT 40 /25. She has also seen ads about the GI dangers of Benicar. 3. Rash - in the intertriginous areas under the breast and at the perianal region. Pruiritic.   Past Medical History  Diagnosis Date  . Hypertension   . Diverticulitis    Past Surgical History  Procedure Laterality Date  . Colon resection  2000's Dr. Hassell Done    Diverticulitis.  [name] [name] c-section1     Family History  Problem Relation Age of Onset  . Hypertension Mother   . Hypertension Father   . Heart disease Father   . Diabetes Neg Hx   . COPD Neg Hx    History   Social History  . Marital Status: Divorced    Spouse Name: N/A    Number of Children: 3  . Years of Education: 14   Occupational History  .     Social History Main Topics  . Smoking status: Never Smoker   . Smokeless tobacco: Never Used  . Alcohol Use: No  . Drug Use: No  . Sexual Activity: No   Other Topics Concern  . Not on file   Social History Narrative   [employer], [employer] - Human service/sociology. Married '2000 - 69yrs/seperated. 2 boys - '82, '87, 1 dtr - '81.   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         Review of Systems System review is negative for any constitutional, cardiac, pulmonary, GI or neuro symptoms or complaints other than as described in the HPI.     Objective:   Physical Exam Filed  Vitals:   09/08/13 0820  BP: 154/100  Pulse: 78  Temp: 98.4 F (36.9 C)   Weight: 238 lb 12.8 oz (108.319 kg)  Gen'l - overweight woman in no distress HEENT - C&S clea5r Cor - RRR PUlm - normal Ext - click and mild crepitus in the left knee. Tender at tendoninous attachments medial and laterally both knees Derm - dark rash in the area under the breasts.        Assessment & Plan:  Rash - looks like a yeast infection. Probably the same thing at the other end. Plan otc terbinafine cream - apply to very dry skin twice a day.

## 2013-09-08 NOTE — Assessment & Plan Note (Signed)
Blood pressure - as far as my experience goes Benicar has been well tolerated.  You are not well controlled. Plan Add amlodipine 10 mg once a day - samples of tribenzor - a combination of all three drugs.  Monitor blood pressure

## 2013-09-08 NOTE — Patient Instructions (Signed)
1. Blood pressure - as far as my experience goes Benicar has been well tolerated.  You are not well controlled. Plan Add amlodipine 10 mg once a day - samples of tribenzor - a combination of all three drugs.  Monitor blood pressure  2. Knee pain - x-rays consistent with arthritic inflammation. Fluid anaylysis is negative for crystals or infection. There is also great tenderness at the attachment of the tendons bot sides of the knee in both knees. Plan Continue the meloxicam 15 mg once a day ( do not take more)  Tylenol (generic) for pain. 1, 000 mg three times a day maximum dose.  Heat can help  Rub of choice, Asperecreme, Ben-Gay etc.   If this does not seem to help can have you see Dr. Hulan Saas, DO a sports medicine doctor in our office.  3. Rash - looks like a yeast infection. Probably the same thing at the other end. Plan otc terbinafine cream - apply to very dry skin twice a day.    Tendinitis Tendinitis is swelling and inflammation of the tendons. Tendons are band-like tissues that connect muscle to bone. Tendinitis commonly occurs in the:   Shoulders (rotator cuff).  Heels (Achilles tendon).  Elbows (triceps tendon). CAUSES Tendinitis is usually caused by overusing the tendon, muscles, and joints involved. When the tissue surrounding a tendon (synovium) becomes inflamed, it is called tenosynovitis. Tendinitis commonly develops in people whose jobs require repetitive motions. SYMPTOMS  Pain.  Tenderness.  Mild swelling. DIAGNOSIS Tendinitis is usually diagnosed by physical exam. Your caregiver may also order X-rays or other imaging tests. TREATMENT Your caregiver may recommend certain medicines or exercises for your treatment. HOME CARE INSTRUCTIONS   Use a sling or splint for as long as directed by your caregiver until the pain decreases.  Put ice on the injured area.  Put ice in a plastic bag.  Place a towel between your skin and the bag.  Leave the ice on  for 15-20 minutes, 03-04 times a day.  Avoid using the limb while the tendon is painful. Perform gentle range of motion exercises only as directed by your caregiver. Stop exercises if pain or discomfort increase, unless directed otherwise by your caregiver.  Only take over-the-counter or prescription medicines for pain, discomfort, or fever as directed by your caregiver. SEEK MEDICAL CARE IF:   Your pain and swelling increase.  You develop new, unexplained symptoms, especially increased numbness in the hands. MAKE SURE YOU:   Understand these instructions.  Will watch your condition.  Will get help right away if you are not doing well or get worse. Document Released: 08/04/2000 Document Revised: 10/30/2011 Document Reviewed: 10/24/2010 [facility] Patient Information 2014 [place], [place].   Yeast Infection of the Skin Some yeast on the skin is normal, but sometimes it causes an infection. If you have a yeast infection, it shows up as white or light brown patches on brown skin. You can see it better in the summer on tan skin. It causes light-colored holes in your suntan. It can happen on any area of the body. This cannot be passed from person to person. HOME CARE  Scrub your skin daily with a dandruff shampoo. Your rash may take a couple weeks to get well.  Do not scratch or itch the rash. GET HELP RIGHT AWAY IF:   You get another infection from scratching. The skin may get warm, red, and may ooze fluid.  The infection does not seem to be getting better. MAKE  SURE YOU:  Understand these instructions.  Will watch your condition.  Will get help right away if you are not doing well or get worse. Document Released: 07/20/2008 Document Revised: 10/30/2011 Document Reviewed: 07/20/2008 [facility] Patient Information 2014 [place].

## 2013-09-08 NOTE — Assessment & Plan Note (Signed)
Knee pain - x-rays consistent with arthritic inflammation. Fluid anaylysis is negative for crystals or infection. There is also great tenderness at the attachment of the tendons bot sides of the knee in both knees. Plan Continue the meloxicam 15 mg once a day ( do not take more)  Tylenol (generic) for pain. 1, 000 mg three times a day maximum dose.  Heat can help  Rub of choice, Asperecreme, Ben-Gay etc.   If this does not seem to help can have you see Dr. Hulan Saas, DO a sports medicine doctor in our office.

## 2013-09-08 NOTE — Progress Notes (Signed)
Pre visit review using our clinic review tool, if applicable. No additional management support is needed unless otherwise documented below in the visit note. 

## 2013-09-13 ENCOUNTER — Encounter: Payer: Self-pay | Admitting: Internal Medicine

## 2013-09-16 ENCOUNTER — Other Ambulatory Visit: Payer: Self-pay | Admitting: Internal Medicine

## 2013-09-16 MED ORDER — MELOXICAM 15 MG PO TABS: 15.0000 mg | ORAL_TABLET | Freq: Every day | ORAL | Status: DC

## 2013-09-23 ENCOUNTER — Telehealth: Payer: Self-pay

## 2013-09-23 NOTE — Telephone Encounter (Signed)
The pt is hoping to samples of her blood pressure medication.   Callback - [phone]

## 2013-09-23 NOTE — Telephone Encounter (Signed)
I called and left a message for patient that samples of Tribenzor are at the front desk.

## 2013-10-10 ENCOUNTER — Telehealth: Payer: Self-pay | Admitting: *Deleted

## 2013-10-10 NOTE — Telephone Encounter (Signed)
Patient phoned requesting samples for her tribenzor.  4 weeks worth are awaiting p/u in bin.  Phoned and left voicemail message notifying patient.

## 2013-11-13 ENCOUNTER — Encounter: Payer: Self-pay | Admitting: Physician Assistant

## 2013-11-13 ENCOUNTER — Ambulatory Visit (INDEPENDENT_AMBULATORY_CARE_PROVIDER_SITE_OTHER): Payer: Self-pay | Admitting: Physician Assistant

## 2013-11-13 ENCOUNTER — Ambulatory Visit: Payer: Self-pay | Admitting: Internal Medicine

## 2013-11-13 VITALS — BP 150/90 | HR 85 | Temp 97.7°F | Ht 65.0 in

## 2013-11-13 DIAGNOSIS — M25561 Pain in right knee: Secondary | ICD-10-CM

## 2013-11-13 DIAGNOSIS — M25569 Pain in unspecified knee: Secondary | ICD-10-CM

## 2013-11-13 DIAGNOSIS — M25562 Pain in left knee: Principal | ICD-10-CM

## 2013-11-13 MED ORDER — TRAMADOL HCL 50 MG PO TABS: 50.0000 mg | ORAL_TABLET | Freq: Three times a day (TID) | ORAL | Status: DC | PRN

## 2013-11-13 NOTE — Progress Notes (Signed)
   Subjective:    Patient ID: [name] [name], female    DOB: 07-16-1966, 48 y.o.   MRN: [id number]  HPI Comments: Patient is a 48 year old female who presents to the office with complaint of bilateral knee pain. Reports this has been ongoing for a couple of months now. In January patient was seen in the ED for left knee pain and swelling, had knee drained two separate times. X-rays were completed and fluid sent for culture. Effusion noted to be arthritic inflammation. Reports both knees painful and swelling today. States swelling is worse in the last couple days and she feels the skin around her knees is getting tight. States able to bend and straighten knees however has pain doing so. 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 Reports painful to walk around all day. Has used tramadol and Meloxicam in the past with moderate relief of pain. Is out of Tramadol. Denies N/V/F, chest pain, palpitations, SOB or cough.    Review of Systems  Constitutional: Negative for fever and chills.  Respiratory: Negative for cough and shortness of breath.   Cardiovascular: Negative for chest pain and palpitations.  Gastrointestinal: Negative for nausea and vomiting.  Musculoskeletal: Positive for joint swelling (bilateral knee pain and swelling).  Skin: Negative for color change and rash.   Past Medical History  Diagnosis Date  . Hypertension   . Diverticulitis       Objective:   Physical Exam  Vitals reviewed. Constitutional: She is oriented to person, place, and time. She appears well-developed and well-nourished. No distress.  Pleasant, obese female seated in chair with both legs extended in front of her.  HENT:  Head: Normocephalic and atraumatic.  Eyes: Conjunctivae are normal.  Neck: Normal range of motion.  Cardiovascular: Normal rate, regular rhythm and intact distal pulses.  Exam reveals no gallop and no friction rub.   No murmur heard. Pulses:      Posterior tibial  pulses are 2+ on the right side, and 2+ on the left side.  Pulmonary/Chest: Effort normal and breath sounds normal. She has no wheezes. She has no rales.  Musculoskeletal:  Bilateral lower extremity obese, difficult to appreciate edema. Bilateral knees diffusely tender to palpation of anterior knee. TTP along lateral and medial aspects of knee joint. Crepitus noted to bilateral knee with flexion. Overlying soft tissue is without erythema, tactile heat or rash. DNVI bilateral.   Neurological: She is alert and oriented to person, place, and time.  Skin: Skin is warm and dry.  Psychiatric: She has a normal mood and affect.   Filed Vitals:   11/13/13 0854  BP: 150/90  Pulse: 85  Temp: 97.7 F (36.5 C)      Assessment & Plan:   Knee pain, bilateral Refill Rx for Tramadol 50 mg Instructed to continue use of Meloxicam and heat rub for symptom relief. Instructed to follow up with Dr. Charlann Boxer, sports medicine DO.

## 2013-11-13 NOTE — Patient Instructions (Addendum)
It was great to meet you today Ms. Umeda!   I have sent a prescription for Tramadol to your pharmacy.  Please schedule an appointment with Dr. Charlann Boxer for further evaluation.  Knee Pain Knee pain can be a result of an injury or other medical conditions. Treatment will depend on the cause of your pain. HOME CARE  Only take medicine as told by your doctor.  Keep a healthy weight. Being overweight can make the knee hurt more.  Stretch before exercising or playing sports.  If there is constant knee pain, change the way you exercise. Ask your doctor for advice.  Make sure shoes fit well. Choose the right shoe for the sport or activity.  Protect your knees. Wear kneepads if needed.  Rest when you are tired. GET HELP RIGHT AWAY IF:   Your knee pain does not stop.  Your knee pain does not get better.  Your knee joint feels hot to the touch.  You have a fever. MAKE SURE YOU:   Understand these instructions.  Will watch this condition.  Will get help right away if you are not doing well or get worse. Document Released: 11/03/2008 Document Revised: 10/30/2011 Document Reviewed: 11/03/2008 [facility] Patient Information 2014 [place], [place].

## 2013-11-13 NOTE — Progress Notes (Signed)
Pre visit review using our clinic review tool, if applicable. No additional management support is needed unless otherwise documented below in the visit note. 

## 2013-11-18 ENCOUNTER — Encounter: Payer: Self-pay | Admitting: Family Medicine

## 2013-11-18 ENCOUNTER — Ambulatory Visit (INDEPENDENT_AMBULATORY_CARE_PROVIDER_SITE_OTHER): Payer: Self-pay | Admitting: Family Medicine

## 2013-11-18 ENCOUNTER — Other Ambulatory Visit (INDEPENDENT_AMBULATORY_CARE_PROVIDER_SITE_OTHER): Payer: Self-pay

## 2013-11-18 VITALS — BP 142/90 | HR 89 | Wt 240.0 lb

## 2013-11-18 DIAGNOSIS — M25561 Pain in right knee: Secondary | ICD-10-CM

## 2013-11-18 DIAGNOSIS — M25569 Pain in unspecified knee: Secondary | ICD-10-CM

## 2013-11-18 DIAGNOSIS — M25562 Pain in left knee: Principal | ICD-10-CM

## 2013-11-18 DIAGNOSIS — M1710 Unilateral primary osteoarthritis, unspecified knee: Secondary | ICD-10-CM | POA: Insufficient documentation

## 2013-11-18 NOTE — Assessment & Plan Note (Signed)
Patient had aspiration and injection of the knees bilaterally today. Patient did have sizable decrease in pain immediately and was able to cannulate better. Patient given home exercise program, will wear braces with a lot of walking, discussed icing protocol, we discussed the possibility of needing viscous supplementation. Patient will try these interventions over-the-counter medications and come back again in 4-6 weeks for further followup. If continued to have pain I would start viscous supplementation. Bilateral standing x-rays ordered today as well.

## 2013-11-18 NOTE — Progress Notes (Signed)
[name] [address] [place], [place] [address] Phone: [phone] Subjective:    I'm seeing this patient by the request  of:  Stacy Gardner, PA-C   CC: knee pain  [id number] [name] [name] is a 48 y.o. female coming in with complaint of knee pain. Patient is having the pain bilaterally. Patient was seen previously in the emergency department in January of 2015 and did have a knee effusion. Patient did have this drained at the time. Patient did have x-rays of the left knee. These are reviewed by me. X-rays in January of 2015 showed mild tricompartmental degenerative changes but these were nonweightbearing. Patient states since this time she continues to have knee pain. Patient has trouble with ambulation and with full extension and flexion. She has tried meloxicam and tramadol with minimal improvement. Patient describes the pain as more of a dull aching sensation. Patient states that the pain does wake her up at night. The severity of 7/10.    Past medical history, social, surgical and family history all reviewed in electronic medical record.   Review of Systems: No headache, visual changes, nausea, vomiting, diarrhea, constipation, dizziness, abdominal pain, skin rash, fevers, chills, night sweats, weight loss, swollen lymph nodes, body aches, joint swelling, muscle aches, chest pain, shortness of breath, mood changes.   Objective Blood pressure 142/90, pulse 89, weight 240 lb (108.863 kg), SpO2 97.00%.  General: No apparent distress alert and oriented x3 mood and affect normal, dressed appropriately.  HEENT: Pupils equal, extraocular movements intact  Respiratory: Patient's speak in full sentences and does not appear short of breath  Cardiovascular: No lower extremity edema, non tender, no erythema  Skin: Warm dry intact with no signs of infection or rash on extremities or on axial skeleton.  Abdomen: Soft nontender  Neuro: Cranial nerves II  through XII are intact, neurovascularly intact in all extremities with 2+ DTRs and 2+ pulses.  Lymph: No lymphadenopathy of posterior or anterior cervical chain or axillae bilaterally.  Gait normal with good balance and coordination.  MSK:  Non tender with full range of motion and good stability and symmetric strength and tone of shoulders, elbows, wrist, hip, and ankles bilaterally.   Knee: Bilateral On inspection patient does have effusion left greater than right of both knees. Mild osteophytic changes of the medial joint line bilaterally the Tenderness to palpation over the medial joint line bilaterally Range of motion has extension lacking the last 5 and flexion to 90 bilaterally . Ligaments with solid consistent endpoints including ACL, PCL, LCL, MCL. + Mcmurray's, Apley's, and Thessalonian tests.  painful patellar compression. Patellar glide with moderate crepitus. Patellar and quadriceps tendons unremarkable. Hamstring and quadriceps strength is normal.   MSK US performed of: Bilateral This study was ordered, performed, and interpreted by Charlann Boxer D.O.  Knee: All structures visualized. Patient does have degenerative chronic changes of the meniscus bilaterally and significant narrowing of the medial joint lines bilaterally. Patient does have an effusion noted a significant amount of swelling in the left knee. Patellar Tendon unremarkable on long and transverse views without effusion. No abnormality of prepatellar bursa. LCL and MCL unremarkable on long and transverse views. No abnormality of origin of medial or lateral head of the gastrocnemius.  IMPRESSION:  Bilateral knee effusion with underlying osteophytic changes  Procedure: Real-time Ultrasound Guided Injection of bilateral knees Device: GE Logiq E  Ultrasound guided injection is preferred based studies that show increased duration, increased effect, greater accuracy, decreased procedural  pain, increased response rate,  and decreased cost with ultrasound guided versus blind injection.  Verbal informed consent obtained.  Time-out conducted.  Noted no overlying erythema, induration, or other signs of local infection.  Skin prepped in a sterile fashion.  Local anesthesia: Topical Ethyl chloride.  With sterile technique and under real time ultrasound guidance:  Patient was prepped bilaterally in the anterior lateral aspect of the knees. Patient in a recumbent position patient did have a 23-gauge 2 inch needle injected into the lateral aspect leg under ultrasound guidance. It was placed patient did have 4 cc of 0.4% Marcaine. Patient did did have aspiration of approximately 25 cc of straw-colored fluid from the left knee and 10 cc from the right knee removed. Patient then had 1 cc of Kenalog 40 mg/dL injected into both knees. Completed without difficulty  Pain immediately resolved suggesting accurate placement of the medication.  Advised to call if fevers/chills, erythema, induration, drainage, or persistent bleeding.  Images permanently stored and available for review in the ultrasound unit.  Impression: Technically successful ultrasound guided injections Body mass index is 39.94 kg/(m^2).     Impression and Recommendations:     This case required medical decision making of moderate complexity.

## 2013-11-18 NOTE — Patient Instructions (Signed)
Good to meet you Exercises 3 times a week Ice 20 minutes 2 times a day Take tylenol 650 mg three times a day is the best evidence based medicine we have for arthritis.  Glucosamine sulfate 750mg  twice a day is a supplement that has been shown to help moderate to severe arthritis. Vitamin D 2000 IU daily Fish oil 2 grams daily.  Tumeric 500mg  twice daily.  Capsaicin topically up to four times a day may also help with pain. Cortisone injections are an option if these interventions do not seem to make a difference or need more relief. We did this today.  If cortisone injections do not help, there are different types of shots that may help but they take longer to take effect.  We can discuss this at follow up.  It's important that you continue to stay active. Controlling your weight is important.  Consider physical therapy to strengthen muscles around the joint that hurts to take pressure off of the joint itself. Shoe inserts with good arch support may be helpful.  [employer] orthotics at [employer] could help.  Water aerobics and cycling with low resistance are the best two types of exercise for arthritis. Come back and see me in 4-6 weeks.

## 2013-11-29 ENCOUNTER — Emergency Department (HOSPITAL_COMMUNITY): Payer: No Typology Code available for payment source

## 2013-11-29 ENCOUNTER — Encounter (HOSPITAL_COMMUNITY): Payer: Self-pay | Admitting: Emergency Medicine

## 2013-11-29 ENCOUNTER — Emergency Department (HOSPITAL_COMMUNITY)
Admission: EM | Admit: 2013-11-29 | Discharge: 2013-11-29 | Disposition: A | Discharge status: Home / Self Care | Payer: No Typology Code available for payment source | Attending: Emergency Medicine | Admitting: Emergency Medicine

## 2013-11-29 DIAGNOSIS — Z79899 Other long term (current) drug therapy: Secondary | ICD-10-CM | POA: Insufficient documentation

## 2013-11-29 DIAGNOSIS — S63509A Unspecified sprain of unspecified wrist, initial encounter: Secondary | ICD-10-CM | POA: Insufficient documentation

## 2013-11-29 DIAGNOSIS — IMO0002 Reserved for concepts with insufficient information to code with codable children: Secondary | ICD-10-CM | POA: Insufficient documentation

## 2013-11-29 DIAGNOSIS — I10 Essential (primary) hypertension: Secondary | ICD-10-CM | POA: Insufficient documentation

## 2013-11-29 DIAGNOSIS — S59909A Unspecified injury of unspecified elbow, initial encounter: Secondary | ICD-10-CM | POA: Insufficient documentation

## 2013-11-29 DIAGNOSIS — S59919A Unspecified injury of unspecified forearm, initial encounter: Secondary | ICD-10-CM | POA: Insufficient documentation

## 2013-11-29 DIAGNOSIS — Y92410 Unspecified street and highway as the place of occurrence of the external cause: Secondary | ICD-10-CM | POA: Insufficient documentation

## 2013-11-29 DIAGNOSIS — Z8719 Personal history of other diseases of the digestive system: Secondary | ICD-10-CM | POA: Insufficient documentation

## 2013-11-29 DIAGNOSIS — Z791 Long term (current) use of non-steroidal anti-inflammatories (NSAID): Secondary | ICD-10-CM | POA: Insufficient documentation

## 2013-11-29 DIAGNOSIS — S63501A Unspecified sprain of right wrist, initial encounter: Secondary | ICD-10-CM

## 2013-11-29 DIAGNOSIS — M25521 Pain in right elbow: Secondary | ICD-10-CM

## 2013-11-29 DIAGNOSIS — Y9389 Activity, other specified: Secondary | ICD-10-CM | POA: Insufficient documentation

## 2013-11-29 DIAGNOSIS — S6990XA Unspecified injury of unspecified wrist, hand and finger(s), initial encounter: Secondary | ICD-10-CM | POA: Insufficient documentation

## 2013-11-29 MED ORDER — TRAMADOL HCL 50 MG PO TABS
50.0000 mg | ORAL_TABLET | Freq: Once | ORAL | Status: AC
Start: 2013-11-29 — End: 2013-11-29
  Administered 2013-11-29: 50 mg via ORAL
  Filled 2013-11-29: qty 1

## 2013-11-29 MED ORDER — ACETAMINOPHEN 325 MG PO TABS: 650.0000 mg | ORAL_TABLET | Freq: Four times a day (QID) | ORAL | Status: DC | PRN

## 2013-11-29 NOTE — ED Notes (Signed)
Patient transported to X-ray 
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
## 2013-11-29 NOTE — Discharge Instructions (Signed)
Please call your doctor for a followup appointment within 24-48 hours. When you talk to your doctor please let them know that you were seen in the emergency department and have them acquire all of your records so that they can discuss the findings with you and formulate a treatment plan to fully care for your new and ongoing problems. Please call and set-up an appointment with your primary care provider to be re-assessed within the next 24-48 hours Please call and set up appointment with orthopedics Please keep wrist in brace and elbow in sling for comfort Please rest, ice, elevate Please avoid any physical or strenuous activity Please take medications as prescribed - no more than 4,000mg /4 grams per day for this can lead to Tylenol overdose and liver issues Please continue to monitor symptoms closely if symptoms are to worsen or change (fever greater than 1, swelling, redness, red streaks running up the arm, numbness, tingling, fall, injury, confusion, headache, blurred vision, sudden loss of vision, chest pain, shortness of breath, difficulty breathing, nausea, vomiting) please report back to the ED immediately  Sprain A sprain is a tear in one of the strong, fibrous tissues that connect your bones (ligaments). The severity of the sprain depends on how much of the ligament is torn. The tear can be either partial or complete. CAUSES  Often, sprains are a result of a fall or an injury. The force of the impact causes the fibers of your ligament to stretch beyond their normal length. This excess tension causes the fibers of your ligament to tear. SYMPTOMS  You may have some loss of motion or increased pain within your normal range of motion. Other symptoms include:  Bruising.  Tenderness.  Swelling. DIAGNOSIS  In order to diagnose a sprain, your caregiver will physically examine you to determine how torn the ligament is. Your caregiver may also suggest an X-ray exam to make sure no bones are  broken. TREATMENT  If your ligament is only partially torn, treatment usually involves keeping the injured area in a fixed position (immobilization) for a short period. To do this, your caregiver will apply a bandage, cast, or splint to keep the area from moving until it heals. For a partially torn ligament, the healing process usually takes 2 to 3 weeks. If your ligament is completely torn, you may need surgery to reconnect the ligament to the bone or to reconstruct the ligament. After surgery, a cast or splint may be applied and will need to stay on for 4 to 6 weeks while your ligament heals. HOME CARE INSTRUCTIONS  Keep the injured area elevated to decrease swelling.  To ease pain and swelling, apply ice to your joint twice a day, for 2 to 3 days.  Put ice in a plastic bag.  Place a towel between your skin and the bag.  Leave the ice on for 15 minutes.  Only take over-the-counter or prescription medicine for pain as directed by your caregiver.  Do not leave the injured area unprotected until pain and stiffness go away (usually 3 to 4 weeks).  Do not allow your cast or splint to get wet. Cover your cast or splint with a plastic bag when you shower or bathe. Do not swim.  Your caregiver may suggest exercises for you to do during your recovery to prevent or limit permanent stiffness. SEEK IMMEDIATE MEDICAL CARE IF:  Your cast or splint becomes damaged.  Your pain becomes worse. MAKE SURE YOU:  Understand these instructions.  Will watch  your condition.  Will get help right away if you are not doing well or get worse. Document Released: 08/04/2000 Document Revised: 10/30/2011 Document Reviewed: 08/19/2011 [facility] Patient Information 2014 [place], [place].

## 2013-11-29 NOTE — ED Notes (Signed)
Called ortho tech for splint wrist and sling immobilizer.

## 2013-11-30 NOTE — ED Provider Notes (Signed)
Medical screening examination/treatment/procedure(s) were performed by non-physician practitioner and as supervising physician I was immediately available for consultation/collaboration.   EKG Interpretation None        Wandra Arthurs, MD 11/30/13 [phone]

## 2013-12-19 ENCOUNTER — Telehealth: Payer: Self-pay | Admitting: Internal Medicine

## 2013-12-19 MED ORDER — FLUCONAZOLE 150 MG PO TABS: 150.0000 mg | ORAL_TABLET | Freq: Once | ORAL | Status: DC

## 2013-12-19 NOTE — Telephone Encounter (Signed)
Diflucan 150mg #1

## 2013-12-19 NOTE — Telephone Encounter (Signed)
Left message on VM Rx sent 

## 2013-12-19 NOTE — Telephone Encounter (Signed)
Pt was given antibiotics at the ER.  She now has a yeast infection and would like something called in for it.  She was [employer] pt. [facility] on [address]

## 2014-01-22 ENCOUNTER — Ambulatory Visit: Payer: Self-pay | Admitting: Internal Medicine

## 2014-02-05 ENCOUNTER — Telehealth: Payer: Self-pay | Admitting: Internal Medicine

## 2014-02-05 ENCOUNTER — Ambulatory Visit: Payer: Self-pay | Admitting: Internal Medicine

## 2014-02-05 DIAGNOSIS — Z0289 Encounter for other administrative examinations: Secondary | ICD-10-CM

## 2014-02-05 NOTE — Telephone Encounter (Signed)
Noted, pt last seen mar 2015.

## 2014-02-05 NOTE — Telephone Encounter (Signed)
Patient was a no show today for a new patient transfer appt (from [place]).  Patient had an appt scheduled 01/22/14 but had cancelled that appt.  Patient right now has no other standing appts with Dr. Jenny Reichmann.  Please advise.  Thanks!

## 2014-02-19 ENCOUNTER — Other Ambulatory Visit: Payer: Self-pay | Admitting: Nurse Practitioner

## 2014-02-19 DIAGNOSIS — M79606 Pain in leg, unspecified: Secondary | ICD-10-CM

## 2014-02-24 ENCOUNTER — Encounter (HOSPITAL_COMMUNITY): Payer: Self-pay | Admitting: Emergency Medicine

## 2014-02-24 ENCOUNTER — Ambulatory Visit
Admission: RE | Admit: 2014-02-24 | Discharge: 2014-02-24 | Disposition: A | Discharge status: Home / Self Care | Payer: BC Managed Care – PPO | Source: Ambulatory Visit | Attending: Nurse Practitioner | Admitting: Nurse Practitioner

## 2014-02-24 ENCOUNTER — Emergency Department (HOSPITAL_COMMUNITY)
Admission: EM | Admit: 2014-02-24 | Discharge: 2014-02-24 | Disposition: A | Discharge status: Home / Self Care | Payer: BC Managed Care – PPO | Attending: Emergency Medicine | Admitting: Emergency Medicine

## 2014-02-24 DIAGNOSIS — M79605 Pain in left leg: Secondary | ICD-10-CM

## 2014-02-24 DIAGNOSIS — M79606 Pain in leg, unspecified: Secondary | ICD-10-CM

## 2014-02-24 DIAGNOSIS — Z791 Long term (current) use of non-steroidal anti-inflammatories (NSAID): Secondary | ICD-10-CM | POA: Insufficient documentation

## 2014-02-24 DIAGNOSIS — M79609 Pain in unspecified limb: Secondary | ICD-10-CM | POA: Insufficient documentation

## 2014-02-24 DIAGNOSIS — I10 Essential (primary) hypertension: Secondary | ICD-10-CM | POA: Insufficient documentation

## 2014-02-24 DIAGNOSIS — Z79899 Other long term (current) drug therapy: Secondary | ICD-10-CM | POA: Insufficient documentation

## 2014-02-24 DIAGNOSIS — Z8719 Personal history of other diseases of the digestive system: Secondary | ICD-10-CM | POA: Insufficient documentation

## 2014-02-24 MED ORDER — HYDROCODONE-ACETAMINOPHEN 5-325 MG PO TABS: 1.0000 | ORAL_TABLET | Freq: Four times a day (QID) | ORAL | Status: DC | PRN

## 2014-02-24 MED ORDER — ENOXAPARIN SODIUM 120 MG/0.8ML ~~LOC~~ SOLN
1.0000 mg/kg | Freq: Once | SUBCUTANEOUS | Status: AC
Start: 2014-02-24 — End: 2014-02-24
  Administered 2014-02-24: 110 mg via SUBCUTANEOUS
  Filled 2014-02-24: qty 0.8

## 2014-02-24 NOTE — ED Notes (Signed)
Pt presents to ED with c/o left leg pain.  Pt reports that left leg pain has been going on for 3 weeks but it "has just gotten so worst tonight that it paralyzes me when it hits me, pain comes and goes".  Pt denies trauma or injury.

## 2014-02-24 NOTE — ED Provider Notes (Signed)
CSN: [id number]     Arrival date & time 02/24/14  0110 History   First MD Initiated Contact with Patient 02/24/14 0157     Chief Complaint  Patient presents with  . Leg Pain     (Consider location/radiation/quality/duration/timing/severity/associated sxs/prior Treatment) HPI This is a 48 year old female with a three-week history of pain in her left lower leg. The pain primarily originates in her left popliteal fossa but shoots throughout her entire left lower leg. It is worse with ambulation or palpation. It has become so severe over the past few hours that she is having difficulty sleeping. She denies any injury to that leg. She is scheduled for a Doppler ultrasound today at 2:15 PM. She is here because she cannot stand the pain. She states that leg is swollen when compared to the other one.  Past Medical History  Diagnosis Date  . Hypertension   . Diverticulitis    Past Surgical History  Procedure Laterality Date  . Colon resection  2000's Dr. Hassell Done    Diverticulitis.  [name] c-section1     Family History  Problem Relation Age of Onset  . Hypertension Mother   . Hypertension Father   . Heart disease Father   . Diabetes Neg Hx   . COPD Neg Hx    History  Substance Use Topics  . Smoking status: Never Smoker   . Smokeless tobacco: Never Used  . Alcohol Use: No   OB History   Grav Para Term Preterm Abortions TAB SAB Ect Mult Living                 Review of Systems  All other systems reviewed and are negative.   Allergies  Dilaudid; Ibuprofen; and Lisinopril  Home Medications   Prior to Admission medications   Medication Sig Start Date End Date Taking? Authorizing Provider  acetaminophen (TYLENOL) 325 MG tablet Take 2 tablets (650 mg total) by mouth every 6 (six) hours as needed. 11/29/13  Yes Marissa Sciacca, PA-C  aspirin 325 MG tablet Take 325 mg by mouth every 4 (four) hours as needed for moderate pain.   Yes Historical Provider, MD  meloxicam (MOBIC) 15 MG  tablet Take 1 tablet (15 mg total) by mouth daily. 09/16/13  Yes Neena Rhymes, MD  Multiple Vitamin (MULTIVITAMIN WITH MINERALS) TABS tablet Take 1 tablet by mouth daily.   Yes Historical Provider, MD  Olmesartan-Amlodipine-HCTZ (TRIBENZOR) 40-10-25 MG TABS Take 1 tablet by mouth daily. 09/08/13  Yes Neena Rhymes, MD  omega-3 acid ethyl esters (LOVAZA) 1 G capsule Take 2 g by mouth daily.   Yes Historical Provider, MD  traMADol (ULTRAM) 50 MG tablet Take 50 mg by mouth every 8 (eight) hours as needed for moderate pain.   Yes Historical Provider, MD  VITAMIN D, CHOLECALCIFEROL, PO Take 5,000 Units by mouth daily.    Yes Historical Provider, MD   BP 161/92  Pulse 82  Temp(Src) 99.7 F (37.6 C) (Oral)  Resp 16  SpO2 100%  Physical Exam General: Well-developed, well-nourished female in no acute distress; appearance consistent with age of record HENT: normocephalic; atraumatic Eyes: pupils equal, round and reactive to light; extraocular muscles intact Neck: supple Heart: regular rate and rhythm Lungs: clear to auscultation bilaterally Abdomen: soft; nondistended Back: Mild right paralumbar tenderness Extremities: No deformity; full range of motion; pulses normal; trace edema of left lower leg; generalized tenderness of left lower leg without erythema, warmth or deformity Neurologic: Awake, alert and oriented; motor function  intact in all extremities and symmetric; no facial droop Skin: Warm and dry Psychiatric: Normal mood and affect    ED Course  Procedures (including critical care time)  MDM  We will administer Lovenox pending Doppler ultrasound later this afternoon.    Wynetta Fines, MD 02/24/14 [phone]

## 2014-03-02 ENCOUNTER — Other Ambulatory Visit (HOSPITAL_COMMUNITY): Payer: Self-pay | Admitting: Nurse Practitioner

## 2014-03-02 ENCOUNTER — Encounter: Payer: Self-pay | Admitting: Internal Medicine

## 2014-03-02 DIAGNOSIS — Z1231 Encounter for screening mammogram for malignant neoplasm of breast: Secondary | ICD-10-CM

## 2014-03-10 ENCOUNTER — Encounter: Payer: Self-pay | Admitting: Internal Medicine

## 2014-03-10 ENCOUNTER — Ambulatory Visit (INDEPENDENT_AMBULATORY_CARE_PROVIDER_SITE_OTHER): Payer: BC Managed Care – PPO | Admitting: Internal Medicine

## 2014-03-10 VITALS — BP 122/72 | HR 76 | Ht 65.0 in | Wt 236.6 lb

## 2014-03-10 DIAGNOSIS — L290 Pruritus ani: Secondary | ICD-10-CM

## 2014-03-10 DIAGNOSIS — K5909 Other constipation: Secondary | ICD-10-CM

## 2014-03-10 DIAGNOSIS — K648 Other hemorrhoids: Secondary | ICD-10-CM

## 2014-03-10 DIAGNOSIS — B359 Dermatophytosis, unspecified: Secondary | ICD-10-CM

## 2014-03-10 DIAGNOSIS — K5900 Constipation, unspecified: Secondary | ICD-10-CM

## 2014-03-10 MED ORDER — HYDROCORTISONE ACETATE 25 MG RE SUPP: RECTAL | Status: DC

## 2014-03-10 MED ORDER — NYSTATIN 100000 UNIT/GM EX OINT: TOPICAL_OINTMENT | CUTANEOUS | Status: DC

## 2014-03-10 NOTE — Patient Instructions (Signed)
We have sent the following medications to your pharmacy for you to pick up at your convenience: Hydrocortisone suppositories and Nystatin ointment.  Continue your Miralax daily.   We have scheduled you for an appointment to see Dr. Doug Sou at [facility] primary care in the [place] building on 06/16/14 at 9:00am. If you need to reschedule or cancel please call [phone].  Please have the labs you had done today sent to our office by fax. Our fax number is [phone].  We have scheduled your follow up appointment with Tye Savoy, NP on 04/10/14 at 8:30am. If you need to reschedule your appointment, please call [phone].   cc: Dr. Doug Sou

## 2014-03-10 NOTE — Progress Notes (Signed)
Patient ID: [name] [name], female   DOB: Apr 23, 1966, 48 y.o.   MRN: [id number] HPI: [name] [name] is a 48 year old female with past medical history of chronic constipation, left-sided diverticulosis with diverticulitis status post sigmoid resection, chronic constipation, hypertension who is seen in consultation at the request of Stacy Gardner, PA-C to evaluate perianal itching. She was previously noted Dr. Verl Blalock and had a colonoscopy on 07/29/2007. This showed a healthy-appearing left colon anastomosis without polyps. 10 year recall was recommended. She reports that she has developed perianal itching and minor seepage. She has tried Tucks medicated pads, Preparation H and Neosporin without benefit. She was given another cream that starts with a "c"but this caused earnings so she did not use it She reports having to wipe frequently. She reports ongoing issues with constipation which have responded to MiraLax. She is using MiraLax 17 g every other day and this is helping with her regularity. If she does not use MiraLax she has a bowel movement only up about once per week. She denies rectal bleeding or melena. She does report lower gas worse with dairy food. She denies heartburn, trouble swallowing or painful swallowing. No nausea or vomiting. Good appetite. Stable weight. She recently had labs within the last week performed through a work-related physical. She requests a new PCP because Dr. Linda Hedges retired.  No family history of colon cancer.  Past Medical History  Diagnosis Date  . Hypertension   . Diverticulitis     Past Surgical History  Procedure Laterality Date  . Colon resection  2000's Dr. Hassell Done    Diverticulitis.  . G3p2 c-section1    . Tubal ligation    . Tonsillectomy      Outpatient Prescriptions Prior to Visit  Medication Sig Dispense Refill  . acetaminophen (TYLENOL) 325 MG tablet Take 2 tablets (650 mg total) by mouth every 6 (six) hours as needed.  30 tablet  0  .  aspirin 325 MG tablet Take 325 mg by mouth every 4 (four) hours as needed for moderate pain.      Marland Kitchen HYDROcodone-acetaminophen (NORCO) 5-325 MG per tablet Take 1-2 tablets by mouth every 6 (six) hours as needed (for pain).  20 tablet  0  . meloxicam (MOBIC) 15 MG tablet Take 1 tablet (15 mg total) by mouth daily.  30 tablet  2  . Multiple Vitamin (MULTIVITAMIN WITH MINERALS) TABS tablet Take 1 tablet by mouth daily.      . Olmesartan-Amlodipine-HCTZ (TRIBENZOR) 40-10-25 MG TABS Take 1 tablet by mouth daily.  30 tablet    . omega-3 acid ethyl esters (LOVAZA) 1 G capsule Take 2 g by mouth daily.      . traMADol (ULTRAM) 50 MG tablet Take 50 mg by mouth every 8 (eight) hours as needed for moderate pain.      Marland Kitchen VITAMIN D, CHOLECALCIFEROL, PO Take 5,000 Units by mouth daily.        No facility-administered medications prior to visit.    Allergies  Allergen Reactions  . Dilaudid [Hydromorphone Hcl] Nausea Only  . Ibuprofen Swelling  . Lisinopril Swelling    REACTION: angioedema    Family History  Problem Relation Age of Onset  . Hypertension Mother   . Hypertension Father   . Heart disease Father   . Diabetes Neg Hx   . COPD Neg Hx     History  Substance Use Topics  . Smoking status: Never Smoker   . Smokeless tobacco: Never Used  . Alcohol Use: No  ROS: As per history of present illness, otherwise negative  BP 122/72  Pulse 76  Ht 5\' 5"  (1.651 m)  Wt 236 lb 9.6 oz (107.321 kg)  BMI 39.37 kg/m2 Constitutional: Well-developed and well-nourished. No distress. HEENT: Normocephalic and atraumatic. Oropharynx is clear and moist. No oropharyngeal exudate. Conjunctivae are normal.  No scleral icterus. Neck: Neck supple. Trachea midline. Cardiovascular: Normal rate, regular rhythm and intact distal pulses. No M/R/G Pulmonary/chest: Effort normal and breath sounds normal. No wheezing, rales or rhonchi. Abdominal: Soft, obese, nontender, nondistended. Bowel sounds active  throughout. Rectal: no perianal rash seen, no excoriation, no external hemorrhoids seen, rectum with masses, solid light brown stool in vault Extremities: no clubbing, cyanosis, or edema Lymphadenopathy: No cervical adenopathy noted. Neurological: Alert and oriented to person place and time. Skin: Skin is warm and dry. No rashes noted. Psychiatric: Normal mood and affect. Behavior is normal.   ASSESSMENT/PLAN: 48 year old female with past medical history of chronic constipation, left-sided diverticulosis with diverticulitis status post sigmoid resection, chronic constipation, hypertension who is seen in consultation at the request of Stacy Gardner, PA-C to evaluate perianal itching  1. Perianal itching/mild fecal soilage -- I'm going to treat her for internal hemorrhoids with hydrocortisone suppositories 25 mg twice daily x5 days. Also given her a prescription for nystatin ointment for tinea to use perianally 2-3 times daily until itching has improved. Continue MiraLax 17 g daily to every other day to keep stool soft and bowel movements regular. Return in one month with advanced practitioner, if symptoms persist may need colonoscopy.  2. Chronic constipation -- MiraLax 17 g once daily to every other day to keep stool soft and bowel movements regular, see #1  3.  colon cancer screening -- Normal colon in Dec 2008, no hx of polyps or family history.  Repeat colon recommended Dec 2018 for screening

## 2014-03-11 ENCOUNTER — Ambulatory Visit (HOSPITAL_COMMUNITY)
Admission: RE | Admit: 2014-03-11 | Discharge: 2014-03-11 | Disposition: A | Discharge status: Home / Self Care | Payer: BC Managed Care – PPO | Source: Ambulatory Visit | Attending: Nurse Practitioner | Admitting: Nurse Practitioner

## 2014-03-11 ENCOUNTER — Ambulatory Visit (HOSPITAL_COMMUNITY): Payer: BC Managed Care – PPO

## 2014-03-11 DIAGNOSIS — Z1231 Encounter for screening mammogram for malignant neoplasm of breast: Secondary | ICD-10-CM

## 2014-03-12 ENCOUNTER — Telehealth: Payer: Self-pay

## 2014-03-12 ENCOUNTER — Other Ambulatory Visit: Payer: Self-pay

## 2014-03-12 ENCOUNTER — Telehealth: Payer: Self-pay | Admitting: Internal Medicine

## 2014-03-12 MED ORDER — OLMESARTAN-AMLODIPINE-HCTZ 40-10-25 MG PO TABS: 1.0000 | ORAL_TABLET | Freq: Every day | ORAL | Status: DC

## 2014-03-12 NOTE — Telephone Encounter (Signed)
Patient needs blood pressure medication tribenzor, states Dr. Linda Hedges was giving her samples because she can not take generic mediations. Patient informed we would have to check on the samples if not the medication would be sent to her pharmacy

## 2014-03-12 NOTE — Telephone Encounter (Signed)
Called pt back inform her no samples was available. Will send rx to [facility] & leave her a saving card...[name] [name]

## 2014-03-12 NOTE — Telephone Encounter (Signed)
PA for tribenzor received.   Called insurance co for initiation.

## 2014-03-16 ENCOUNTER — Telehealth: Payer: Self-pay | Admitting: *Deleted

## 2014-03-16 NOTE — Telephone Encounter (Signed)
Left msg on triage requesting to speak with someone concerning her BP medication. Called pt back she stated the tribenzor that md rx is too expensive. She can only take brand name drug. She has been out of BP med for a week now starting to have headaches. Inform pt a PA was submitted to her insurance will check status and give her a call back...Johny Chess

## 2014-03-18 NOTE — Telephone Encounter (Signed)
LVM with pt - PA has not been returned.

## 2014-03-18 NOTE — Telephone Encounter (Signed)
Pt is wanting status on PA. Pls call pt...[name] [name]

## 2014-03-18 NOTE — Telephone Encounter (Signed)
LVM for pt to call back in regards to PA for Tribenzor.   PA was denied due to non coverage by [facility] [place] as of 03/12/2014

## 2014-03-18 NOTE — Telephone Encounter (Signed)
Pls call pt and let her know status! If she has new insurance she needs to have her pharmacy to update her insurance information...[name] [name]

## 2014-03-18 NOTE — Telephone Encounter (Signed)
PA for pt was denied because the pt is no longer covered under [employer] [place] as of 03/12/2014

## 2014-03-20 ENCOUNTER — Other Ambulatory Visit: Payer: Self-pay | Admitting: *Deleted

## 2014-03-20 NOTE — Telephone Encounter (Signed)
Received another PA from pharmacy on pt Tribenzor. Need PA to go through [facility]. Completed PA on cover my meds waiting on approval status...[name] [name]

## 2014-03-20 NOTE — Telephone Encounter (Signed)
Called patient no answer LMOM we are still waiting to here back from insurance company for approval. Do have some samples of the tribenzor if she want to come by the office to pick-up until we get approval on PA...Johny Chess

## 2014-03-23 NOTE — Telephone Encounter (Signed)
Called pt for correct insurance information.   ID# [id number] Group# [id number]  Phone number [phone]

## 2014-03-24 ENCOUNTER — Other Ambulatory Visit: Payer: Self-pay

## 2014-03-24 NOTE — Telephone Encounter (Signed)
[employer] called and wanted to verify that we had the correct id information for insurance.   [employer] verified that I did have the correct insurance information.  3rd PA fax was sent on 03/23/14. [employer] did not see that it was approved. Suggested calling the Approval department to see where it is in processing.   The ID number was wrong the first 2 times that the PA was sent.   Pt expressed dissatisfaction and was upset that she had been without Tribenzor for 3 weeks.

## 2014-03-25 ENCOUNTER — Telehealth: Payer: Self-pay

## 2014-03-25 MED ORDER — AMLODIPINE BESYLATE 10 MG PO TABS: 10.0000 mg | ORAL_TABLET | Freq: Every day | ORAL | Status: DC

## 2014-03-25 MED ORDER — LOSARTAN POTASSIUM-HCTZ 100-25 MG PO TABS: 1.0000 | ORAL_TABLET | Freq: Every day | ORAL | Status: DC

## 2014-03-25 NOTE — Telephone Encounter (Signed)
Note I have never seen pt - Scheduled to be seen by Dr Doug Sou in Oct 2015, no show for Dr Jenny Reichmann appt 01/2014 As tribenzor is denied, change to  Formulary alternates: amlodipine and losartan-hct -  erx for these 2 done (the equivalent meds to [facility] combo) Pt will need to take these to show her insurance company that we have tried BP control with other meds

## 2014-03-25 NOTE — Telephone Encounter (Signed)
Pt informed of rx change and that new PCP will have to do any refills of said rx.   Tribenzor is not taken.   Amlodipine and losartan HCTZ was added.

## 2014-03-25 NOTE — Telephone Encounter (Signed)
PA for Tribenzor has been denied for the third time.   Fax from [facility] stated it is not covered and that alternative will need to be attempted and failed before it can be approved.

## 2014-04-10 ENCOUNTER — Ambulatory Visit: Payer: BC Managed Care – PPO | Admitting: Nurse Practitioner

## 2014-04-29 ENCOUNTER — Ambulatory Visit (INDEPENDENT_AMBULATORY_CARE_PROVIDER_SITE_OTHER): Payer: BC Managed Care – PPO

## 2014-04-29 ENCOUNTER — Ambulatory Visit (INDEPENDENT_AMBULATORY_CARE_PROVIDER_SITE_OTHER): Payer: BC Managed Care – PPO | Admitting: Podiatry

## 2014-04-29 ENCOUNTER — Encounter: Payer: Self-pay | Admitting: Podiatry

## 2014-04-29 VITALS — BP 174/99 | HR 83 | Resp 17

## 2014-04-29 DIAGNOSIS — R52 Pain, unspecified: Secondary | ICD-10-CM

## 2014-04-29 DIAGNOSIS — M722 Plantar fascial fibromatosis: Secondary | ICD-10-CM

## 2014-04-29 MED ORDER — DICLOFENAC SODIUM 75 MG PO TBEC: 75.0000 mg | DELAYED_RELEASE_TABLET | Freq: Two times a day (BID) | ORAL | Status: DC

## 2014-04-29 MED ORDER — TRIAMCINOLONE ACETONIDE 10 MG/ML IJ SUSP: 10.0000 mg | Freq: Once | INTRAMUSCULAR | Status: DC

## 2014-04-29 NOTE — Progress Notes (Signed)
   Subjective:    Patient ID: [name] [name], female    DOB: [birth year], 48 y.o.   MRN: [id number]  HPI Pt presents with bilateral heel pain, worsening over 6 months, right heel is worse and pt states the pain is severe and interferes with her walking, noted mild swelling bilateral feet, altered gait due to pain   Review of Systems  Musculoskeletal: Positive for arthralgias.  All other systems reviewed and are negative.      Objective:   Physical Exam        Assessment & Plan:

## 2014-04-29 NOTE — Patient Instructions (Signed)

## 2014-04-30 NOTE — Progress Notes (Signed)
Subjective:     Patient ID: [name] [name], female   DOB: 11/14/1965, 48 y.o.   MRN: [id number]  Foot Pain   patient presents with painful heels of both feet 6 months duration with the right being worse than the left but both of them hurting her quite a bit   Review of Systems  All other systems reviewed and are negative.      Objective:   Physical Exam  Nursing note and vitals reviewed. Constitutional: She is oriented to person, place, and time.  Cardiovascular: Intact distal pulses.   Musculoskeletal: Normal range of motion.  Neurological: She is oriented to person, place, and time.  Skin: Skin is warm.   neurovascular status intact with muscle strength adequate and range of motion subtalar midtarsal joint within normal limits. Patient's found to have intense discomfort plantar aspect heel right over left with inflammation and fluid at the insertion of the tendon into the calcaneus and is found to have digits that are well perfused with moderate depression of the arch upon weightbearing     Assessment:     Plantar fasciitis heel right over left with acute inflammation    Plan:     H&P performed and conditions discussed. Today injected the heel of both feet 3 mg Kenalog 5 mg Xylocaine and applied fascially brace is to reduce inflammation. Pressed on diclofenac 75 mg twice a day and reappoint her recheck one week

## 2014-05-01 ENCOUNTER — Encounter: Payer: Self-pay | Admitting: *Deleted

## 2014-05-04 ENCOUNTER — Ambulatory Visit: Payer: BC Managed Care – PPO | Admitting: Nurse Practitioner

## 2014-05-06 ENCOUNTER — Ambulatory Visit (INDEPENDENT_AMBULATORY_CARE_PROVIDER_SITE_OTHER): Payer: BC Managed Care – PPO | Admitting: Podiatry

## 2014-05-06 ENCOUNTER — Encounter: Payer: Self-pay | Admitting: Podiatry

## 2014-05-06 VITALS — BP 154/77 | HR 83 | Resp 16

## 2014-05-06 DIAGNOSIS — M722 Plantar fascial fibromatosis: Secondary | ICD-10-CM

## 2014-05-06 MED ORDER — TRIAMCINOLONE ACETONIDE 10 MG/ML IJ SUSP
10.0000 mg | Freq: Once | INTRAMUSCULAR | Status: AC
  Administered 2014-05-06: 10 mg

## 2014-05-08 NOTE — Progress Notes (Signed)
Subjective:     Patient ID: [name] [name], female   DOB: 1965-09-29, 48 y.o.   MRN: [id number]  HPI patient presents stating that the right heel has improved quite a bit of the left heel remains very tender   Review of Systems     Objective:   Physical Exam Neurovascular status intact muscle strength adequate with continued discomfort plantar aspect left heel at the insertion of the tendon into the calcaneus    Assessment:     Plantar fasciitis left with inflammation still noted improved on right    Plan:     Reinjected the plantar fascial left and instructed on physical therapy and today dispensed night splint with all instructions on usage

## 2014-05-21 ENCOUNTER — Other Ambulatory Visit: Payer: Self-pay | Admitting: Obstetrics and Gynecology

## 2014-05-21 ENCOUNTER — Other Ambulatory Visit (HOSPITAL_COMMUNITY)
Admission: RE | Admit: 2014-05-21 | Discharge: 2014-05-21 | Disposition: A | Discharge status: Home / Self Care | Payer: BC Managed Care – PPO | Source: Ambulatory Visit | Attending: Obstetrics and Gynecology | Admitting: Obstetrics and Gynecology

## 2014-05-21 DIAGNOSIS — Z113 Encounter for screening for infections with a predominantly sexual mode of transmission: Secondary | ICD-10-CM | POA: Insufficient documentation

## 2014-05-21 DIAGNOSIS — Z1151 Encounter for screening for human papillomavirus (HPV): Secondary | ICD-10-CM | POA: Insufficient documentation

## 2014-05-21 DIAGNOSIS — Z01419 Encounter for gynecological examination (general) (routine) without abnormal findings: Secondary | ICD-10-CM | POA: Diagnosis not present

## 2014-05-22 LAB — CYTOLOGY - PAP

## 2014-05-27 ENCOUNTER — Ambulatory Visit: Payer: BC Managed Care – PPO | Admitting: Podiatry

## 2014-06-05 ENCOUNTER — Other Ambulatory Visit: Payer: Self-pay

## 2014-06-10 ENCOUNTER — Ambulatory Visit: Payer: BC Managed Care – PPO | Admitting: Podiatry

## 2014-06-16 ENCOUNTER — Ambulatory Visit (INDEPENDENT_AMBULATORY_CARE_PROVIDER_SITE_OTHER): Payer: BC Managed Care – PPO | Admitting: Internal Medicine

## 2014-06-16 ENCOUNTER — Encounter: Payer: Self-pay | Admitting: Internal Medicine

## 2014-06-16 VITALS — BP 154/102 | HR 86 | Temp 98.4°F | Resp 12 | Ht 65.0 in | Wt 249.1 lb

## 2014-06-16 DIAGNOSIS — Z6838 Body mass index (BMI) 38.0-38.9, adult: Secondary | ICD-10-CM | POA: Insufficient documentation

## 2014-06-16 DIAGNOSIS — I10 Essential (primary) hypertension: Secondary | ICD-10-CM

## 2014-06-16 DIAGNOSIS — M25561 Pain in right knee: Secondary | ICD-10-CM

## 2014-06-16 DIAGNOSIS — L282 Other prurigo: Secondary | ICD-10-CM | POA: Insufficient documentation

## 2014-06-16 DIAGNOSIS — M25562 Pain in left knee: Secondary | ICD-10-CM

## 2014-06-16 DIAGNOSIS — K219 Gastro-esophageal reflux disease without esophagitis: Secondary | ICD-10-CM

## 2014-06-16 DIAGNOSIS — Z23 Encounter for immunization: Secondary | ICD-10-CM

## 2014-06-16 DIAGNOSIS — E669 Obesity, unspecified: Secondary | ICD-10-CM

## 2014-06-16 MED ORDER — HYDROCHLOROTHIAZIDE 25 MG PO TABS: 25.0000 mg | ORAL_TABLET | Freq: Every day | ORAL | Status: DC

## 2014-06-16 NOTE — Assessment & Plan Note (Signed)
Patient is currently well controlled off medication.

## 2014-06-16 NOTE — Assessment & Plan Note (Signed)
Feel that most of the rash and pruritic areas are in intertriginous areas. Some of this may be related to moisture from skin folds rubbing on skin folds. Feel that her obesity is grossly affecting her skin condition. Will stop several of her blood pressure medications to see if this relieves the pruritus although doubtful given that she was having the pruritus well before some of these blood pressure medications were added. Referral for dermatology placed today.

## 2014-06-16 NOTE — Progress Notes (Signed)
   Subjective:    Patient ID: [name] [name], female    DOB: 05/08/1966, 48 y.o.   MRN: [id number]  HPI The patient is a 48 year old female who comes in today to establish care. She has past medical history of hypertension, osteoarthritis, GERD. She has been having itching all over her body for quite some time now, dating back to notes from January of this year. She states it's only been going on a couple of months however amends that statement when she is reminded. She states it has been worse over the past several months and she attributes this to going onto the generic of her blood pressure medications. As result she has not been taking them faithfully, she usually takes them only when she feels she has swelling. This amounts to about every 3 days or so however since the itching has been intensified she has not even been taking them that frequently. She denies headaches, chest pains, nausea, vomiting, confusion, shortness of breath. She does have quite a bit of itching and does scratch quite frequently and has caused her skin to have some breakdown in several areas. Most of the areas where she has the itching or places where her skin reps against itself or against clothing. She cannot attribute any change in her soaps, perfumes, exposures to outdoor elements along with the itching. She has tried fungal creams, steroid creams, oral fungal agents which have not helped.  Review of Systems  Constitutional: Negative for fever, chills, activity change, appetite change, fatigue and unexpected weight change.  HENT: Negative.   Eyes: Negative.   Respiratory: Negative for cough, chest tightness, shortness of breath and wheezing.   Cardiovascular: Negative for chest pain, palpitations and leg swelling.  Gastrointestinal: Negative for abdominal pain, diarrhea, constipation and abdominal distention.  Musculoskeletal: Positive for arthralgias. Negative for gait problem and myalgias.  Skin: Positive for rash.    Neurological: Negative for dizziness, speech difficulty, weakness and headaches.      Objective:   Physical Exam  Vitals reviewed. Constitutional: She appears well-developed and well-nourished.  Obese  HENT:  Head: Normocephalic and atraumatic.  Eyes: EOM are normal. Pupils are equal, round, and reactive to light.  Neck: Normal range of motion.  Cardiovascular: Normal rate and regular rhythm.   Pulmonary/Chest: Effort normal and breath sounds normal. No respiratory distress. She has no wheezes. She has no rales.  Abdominal: Soft. Bowel sounds are normal. She exhibits no distension. There is no tenderness. There is no rebound.  Lymphadenopathy:    She has no cervical adenopathy.  Skin: Skin is warm and dry. Rash noted.  Rash noted in intertriginous areas and around waistline where closer rubbing, around problem line, under armpits, around skinfolds above the waist. Stigmata of scratching with several healed wounds where she was scratching excessively. No red bumps or lesions indicative of Candida. No satellite lesions   Filed Vitals:   06/16/14 0911  BP: 154/102  Pulse: 86  Temp: 98.4 F (36.9 C)  TempSrc: Oral  Resp: 12  Height: 5\' 5"  (1.651 m)  Weight: 249 lb 1.9 oz (113 kg)  SpO2: 98%      Assessment & Plan:

## 2014-06-16 NOTE — Assessment & Plan Note (Signed)
Patient has not been taking her blood pressure medications correctly, taking them once every 3 days when she has good adherence. Have talked to her about the dangers of chronic hypertension including heart attack and stroke. It is unclear to me why she is on an ARB given that she had angioedema with ACE inhibitor. ACE-I and ARB can both cause skin irritation and rash. Will stop amlodipine, Hyzaar. She will take hydrochlorothiazide 25 mg daily for blood pressure. Have spoke with her the importance of taking blood pressure medication daily and she states she will try. Check basic metabolic panel today.

## 2014-06-16 NOTE — Assessment & Plan Note (Signed)
She is currently using diclofenac which is helping her greatly. This could be raising her blood pressure however given her great pain relief with the medication will continue for now and see how she does when taking her blood pressure medications regularly.

## 2014-06-16 NOTE — Assessment & Plan Note (Signed)
Spoke with patient about diet and exercise however given this pruritic rash she is unable to do this at this time. Feel that some of her itching and rash is coming from intertriginous areas where skin is rubbing on skin due to excessive weight.

## 2014-06-16 NOTE — Progress Notes (Signed)
Pre visit review using our clinic review tool, if applicable. No additional management support is needed unless otherwise documented below in the visit note. 

## 2014-06-16 NOTE — Patient Instructions (Signed)
STOP taking your current blood pressure medicines. We will start you on a new blood pressure medicine called HCTZ. It also has some effect on the fluid. Try to take it everyday as high blood pressure can cause damage to blood vessels in the brain and the heart and keeping the blood pressure at a good level with make your risk for stroke and heart attack lower.   We will send you to the skin doctor (dermatologist) to see if they can figure out your itching and rash.   Come back in about 2 months so we can check on your blood pressure.   Hypertension Hypertension is another name for high blood pressure. High blood pressure forces your heart to work harder to pump blood. A blood pressure reading has two numbers, which includes a higher number over a lower number (example: 110/72). HOME CARE   Have your blood pressure rechecked by your doctor.  Only take medicine as told by your doctor. Follow the directions carefully. The medicine does not work as well if you skip doses. Skipping doses also puts you at risk for problems.  Do not smoke.  Monitor your blood pressure at home as told by your doctor. GET HELP IF:  You think you are having a reaction to the medicine you are taking.  You have repeat headaches or feel dizzy.  You have puffiness (swelling) in your ankles.  You have trouble with your vision. GET HELP RIGHT AWAY IF:   You get a very bad headache and are confused.  You feel weak, numb, or faint.  You get chest or belly (abdominal) pain.  You throw up (vomit).  You cannot breathe very well. MAKE SURE YOU:   Understand these instructions.  Will watch your condition.  Will get help right away if you are not doing well or get worse. Document Released: 01/24/2008 Document Revised: 08/12/2013 Document Reviewed: 05/30/2013 [facility] Patient Information 2015 [place], [place]. This information is not intended to replace advice given to you by your health care provider. Make sure  you discuss any questions you have with your health care provider.

## 2014-06-30 ENCOUNTER — Telehealth: Payer: Self-pay | Admitting: Internal Medicine

## 2014-06-30 ENCOUNTER — Ambulatory Visit: Payer: BC Managed Care – PPO | Admitting: Physician Assistant

## 2014-06-30 NOTE — Telephone Encounter (Signed)
Pt states she is having pain in her rectal area and having some swelling there, states there are some sores around her rectum also. Pt scheduled to see Lori Hvozdovic, PA-C today at 9:45am. Pt aware of appt.

## 2014-07-07 ENCOUNTER — Ambulatory Visit (INDEPENDENT_AMBULATORY_CARE_PROVIDER_SITE_OTHER): Payer: BC Managed Care – PPO | Admitting: Gastroenterology

## 2014-07-07 ENCOUNTER — Encounter: Payer: Self-pay | Admitting: Gastroenterology

## 2014-07-07 VITALS — BP 152/88 | HR 84 | Ht 66.0 in

## 2014-07-07 DIAGNOSIS — K602 Anal fissure, unspecified: Secondary | ICD-10-CM | POA: Insufficient documentation

## 2014-07-07 MED ORDER — DILTIAZEM GEL 2 %: 1.0000 "application " | Freq: Two times a day (BID) | CUTANEOUS | Status: DC

## 2014-07-07 NOTE — Patient Instructions (Signed)
We have sent the following medications to your pharmacy for you to pick up at your convenience: Diltiazem Gel 2 %  Please purchase a probiotic over the counter and take as directed  Probiotics you can purchase are: Align, Florastor, or Cultrelle

## 2014-07-07 NOTE — Progress Notes (Addendum)
     07/07/2014 [name] [name] [id number] 09-Jul-1966   History of Present Illness:  This is a 48 year old female who is known to Dr. Hilarie Fredrickson.  Was treated for hemorrhoids with anusol suppositories when she complained of peri-anal itching in July.  Returns today saying that it is still very sore on the left side of her anus.  Anusol suppositories did not help and actually seemed to make it worse.  No bleeding noted.  Complains of a "fishy smell" from her anus recently.  She has a history of left sided diverticulosis with diverticulitis s/p sigmoid resection.  Previously had a colonoscopy in 07/2007 by Dr. Sharlett Iles, which was normal.  10 year recall recommended.   Current Medications, Allergies, Past Medical History, Past Surgical History, Family History and Social History were reviewed in [employer] record.   Physical Exam: BP 152/88 mmHg  Pulse 84  Ht 5\' 6"  (1.676 m)  Wt  General: Well developed black female in no acute distress Head: Normocephalic and atraumatic Eyes:  Sclerae anicteric, conjunctiva pink  Ears: Normal auditory acuity Rectal:  No external hemorrhoids noted.  No rash noted.  Fissure seen at approximately 8 o'clock position; quite tender on exam. Musculoskeletal: Symmetrical with no gross deformities  Extremities: No edema  Neurological: Alert oriented x 4, grossly non-focal Psychological:  Alert and cooperative. Normal mood and affect  Assessment and Recommendations: -Anal fissure:  Will treat with diltiazem 2% gel BID for 2 weeks (apply an inch inside the rectum and just around the anus).  Avoid aggressive wiping.  Eat a lot of fiber and drink fluids to keep stools soft and prevent constipation.  Addendum: Reviewed and agree with initial management. Jerene Bears, MD

## 2014-07-20 ENCOUNTER — Emergency Department (HOSPITAL_COMMUNITY): Payer: BC Managed Care – PPO

## 2014-07-20 ENCOUNTER — Emergency Department (HOSPITAL_COMMUNITY)
Admission: EM | Admit: 2014-07-20 | Discharge: 2014-07-20 | Disposition: A | Discharge status: Home / Self Care | Payer: BC Managed Care – PPO | Attending: Emergency Medicine | Admitting: Emergency Medicine

## 2014-07-20 ENCOUNTER — Encounter (HOSPITAL_COMMUNITY): Payer: Self-pay

## 2014-07-20 DIAGNOSIS — Z791 Long term (current) use of non-steroidal anti-inflammatories (NSAID): Secondary | ICD-10-CM | POA: Insufficient documentation

## 2014-07-20 DIAGNOSIS — M25462 Effusion, left knee: Secondary | ICD-10-CM | POA: Insufficient documentation

## 2014-07-20 DIAGNOSIS — M25562 Pain in left knee: Secondary | ICD-10-CM | POA: Insufficient documentation

## 2014-07-20 DIAGNOSIS — I10 Essential (primary) hypertension: Secondary | ICD-10-CM | POA: Insufficient documentation

## 2014-07-20 DIAGNOSIS — R52 Pain, unspecified: Secondary | ICD-10-CM

## 2014-07-20 DIAGNOSIS — Z79899 Other long term (current) drug therapy: Secondary | ICD-10-CM | POA: Insufficient documentation

## 2014-07-20 DIAGNOSIS — Z8719 Personal history of other diseases of the digestive system: Secondary | ICD-10-CM | POA: Insufficient documentation

## 2014-07-20 DIAGNOSIS — M7989 Other specified soft tissue disorders: Secondary | ICD-10-CM | POA: Diagnosis present

## 2014-07-20 MED ORDER — OXYCODONE-ACETAMINOPHEN 5-325 MG PO TABS: 1.0000 | ORAL_TABLET | Freq: Four times a day (QID) | ORAL | Status: DC | PRN

## 2014-07-20 MED ORDER — OXYCODONE-ACETAMINOPHEN 5-325 MG PO TABS
1.0000 | ORAL_TABLET | Freq: Once | ORAL | Status: AC
Start: 2014-07-20 — End: 2014-07-20
  Administered 2014-07-20: 1 via ORAL
  Filled 2014-07-20: qty 1

## 2014-07-20 NOTE — Discharge Instructions (Signed)
Elevate, wear brace and continue using anti-inflammatory

## 2014-07-20 NOTE — ED Notes (Signed)
Per pt, leg swelling to left knee.  Pt has had it to be drained here in past for same.

## 2014-07-20 NOTE — ED Provider Notes (Signed)
CSN: [id number]     Arrival date & time 07/20/14  1657 History   First MD Initiated Contact with Patient 07/20/14 1736     Chief Complaint  Patient presents with  . Leg Swelling     (Consider location/radiation/quality/duration/timing/severity/associated sxs/prior Treatment) Patient is a 48 y.o. female presenting with knee pain. The history is provided by the patient.  Knee Pain Location:  Knee Time since incident:  5 days Injury: no   Knee location:  L knee Pain details:    Quality:  Aching, sharp, shooting and throbbing   Radiates to:  L leg   Severity:  Severe   Onset quality:  Gradual   Duration:  5 days   Timing:  Constant   Progression:  Worsening Chronicity:  Recurrent Prior injury to area:  No Relieved by:  Nothing Worsened by:  Activity, bearing weight, flexion and extension Ineffective treatments:  Arthritis medication and rest Associated symptoms: decreased ROM and swelling   Associated symptoms: no fever, no muscle weakness and no tingling   Risk factors comment:  Prior hx of similar sx with prior joint aspiration   Past Medical History  Diagnosis Date  . Hypertension   . Diverticulitis   . GERD (gastroesophageal reflux disease) 07/07/2005   Past Surgical History  Procedure Laterality Date  . Colon resection  2000's Dr. Hassell Done    Diverticulitis.  . G3p2 c-section1    . Tubal ligation    . Tonsillectomy     Family History  Problem Relation Age of Onset  . Hypertension Mother   . Hypertension Father   . Heart disease Father   . Diabetes Neg Hx   . COPD Neg Hx    History  Substance Use Topics  . Smoking status: Never Smoker   . Smokeless tobacco: Never Used  . Alcohol Use: No   OB History    No data available     Review of Systems  Constitutional: Negative for fever.  All other systems reviewed and are negative.     Allergies  Dilaudid; Ibuprofen; and Lisinopril  Home Medications   Prior to Admission medications   Medication Sig  Start Date End Date Taking? Authorizing Provider  Cholecalciferol (VITAMIN D3) 5000 UNITS TABS Take 1 tablet by mouth daily.   Yes Historical Provider, MD  diclofenac (VOLTAREN) 75 MG EC tablet Take 1 tablet (75 mg total) by mouth 2 (two) times daily. 04/29/14  Yes Tamala Fothergill Regal, DPM  diltiazem 2 % GEL Apply 1 application topically 2 (two) times daily. Patient taking differently: Apply 1 application topically 2 (two) times daily. For 3 weeks 07/07/14  Yes Jessica D. Zehr, PA-C  hydrochlorothiazide (HYDRODIURIL) 25 MG tablet Take 1 tablet (25 mg total) by mouth daily. 06/16/14  Yes Olga Millers, MD  Multiple Vitamin (MULTIVITAMIN WITH MINERALS) TABS tablet Take 1 tablet by mouth daily.   Yes Historical Provider, MD   BP 150/95 mmHg  Pulse 106  Temp(Src) 97.9 F (36.6 C) (Oral)  Resp 18  SpO2 98%  LMP 06/02/2014 Physical Exam  Constitutional: She appears well-developed and well-nourished. No distress.  HENT:  Head: Normocephalic and atraumatic.  Cardiovascular: Normal rate.   Pulmonary/Chest: Effort normal.  Musculoskeletal:       Left knee: She exhibits decreased range of motion and effusion. Tenderness found. Medial joint line, lateral joint line and patellar tendon tenderness noted.  No tenderness in popliteal fossa  Neurological: She is alert.  Skin: Skin is warm and dry.  Psychiatric:  She has a normal mood and affect. Her behavior is normal.  Nursing note and vitals reviewed.   ED Course  Procedures (including critical care time) Labs Review Labs Reviewed - No data to display  Imaging Review Dg Knee Complete 4 Views Left  07/20/2014   CLINICAL DATA:  Left knee swelling and pain.  EXAM: LEFT KNEE - COMPLETE 4+ VIEW  COMPARISON:  None.  FINDINGS: No acute fracture, dislocation or joint effusion identified. Degenerative changes are seen involving the medial joint space and patellofemoral joint. No bony lesions or destruction. Soft tissues are unremarkable.  IMPRESSION: No  acute findings. Degenerative changes involving the medial joint space and patellofemoral joint.   Electronically Signed   By: Aletta Edouard M.D.   On: 07/20/2014 18:38     EKG Interpretation None      MDM   Final diagnoses:  Knee pain, acute, left    Patient presents with recurrent swelling of the left knee as well as pain. Last episode was approximately 9-10 months ago. She denies any fever or redness. No evidence of a septic joint. Most likely related to arthritis as patient also does not have a history of gout. When she had imaging done approximately 11 months ago showed that she had mild tricompartment degenerative changes.  Patient will have an x-ray done today and if joint effusion seen we'll drain in place her in a compressive bandage. She is still currently taking diclofenac and recommended that she seek a second opinion with orthopedics.  7:19 PM Images show degenerative changes without acute findings.  Blanchie Dessert, MD 07/20/14 1919

## 2014-07-21 ENCOUNTER — Telehealth: Payer: Self-pay | Admitting: Internal Medicine

## 2014-07-21 DIAGNOSIS — M25569 Pain in unspecified knee: Secondary | ICD-10-CM

## 2014-07-21 NOTE — Telephone Encounter (Signed)
Patient is requesting a referral to a rheumatologist.  States she has been seen in regards for this matter.  States she can not walk.

## 2014-07-23 NOTE — Telephone Encounter (Signed)
Called pt no answer LMOM RTC.../lmb 

## 2014-07-23 NOTE — Addendum Note (Signed)
Addended by: Vertell Novak A on: 07/23/2014 02:17 PM   Modules accepted: Orders

## 2014-07-23 NOTE — Telephone Encounter (Signed)
I'm not sure why she would need rheumatology referral. She was seen in the ED for knee pain and recommended to see an orthopedic surgeon. If she would like we can place orthopedics referral however do not feel she needs dermatology referral.

## 2014-07-23 NOTE — Telephone Encounter (Signed)
Patient called back in regards.  °

## 2014-07-23 NOTE — Telephone Encounter (Signed)
Sent in referral for orthopedics.

## 2014-07-23 NOTE — Telephone Encounter (Signed)
Pt return call back she stated she is needing to see Orthopedic doctor. Fluid keep building up around her knee & having knee pain. She does not need to se a dermatologist.../lmb

## 2014-07-28 ENCOUNTER — Emergency Department (HOSPITAL_COMMUNITY)
Admission: EM | Admit: 2014-07-28 | Discharge: 2014-07-28 | Disposition: A | Discharge status: Home / Self Care | Payer: BC Managed Care – PPO | Source: Home / Self Care | Attending: Emergency Medicine | Admitting: Emergency Medicine

## 2014-07-28 ENCOUNTER — Encounter (HOSPITAL_COMMUNITY): Payer: Self-pay | Admitting: *Deleted

## 2014-07-28 DIAGNOSIS — N200 Calculus of kidney: Secondary | ICD-10-CM

## 2014-07-28 LAB — POCT PREGNANCY, URINE: Preg Test, Ur: NEGATIVE

## 2014-07-28 LAB — POCT I-STAT, CHEM 8
BUN: 9 mg/dL (ref 6–23)
Calcium, Ion: 1.16 mmol/L (ref 1.12–1.23)
Chloride: 102 mEq/L (ref 96–112)
Creatinine, Ser: 1 mg/dL (ref 0.50–1.10)
Glucose, Bld: 96 mg/dL (ref 70–99)
HCT: 39 % (ref 36.0–46.0)
Hemoglobin: 13.3 g/dL (ref 12.0–15.0)
Potassium: 3.2 mEq/L — ABNORMAL LOW (ref 3.7–5.3)
Sodium: 141 mEq/L (ref 137–147)
TCO2: 27 mmol/L (ref 0–100)

## 2014-07-28 LAB — POCT URINALYSIS DIP (DEVICE)
Bilirubin Urine: NEGATIVE
Glucose, UA: NEGATIVE mg/dL
Hgb urine dipstick: NEGATIVE
Ketones, ur: NEGATIVE mg/dL
Leukocytes, UA: NEGATIVE
Nitrite: NEGATIVE
Protein, ur: NEGATIVE mg/dL
Specific Gravity, Urine: >=1.03 (ref 1.005–1.030)
Urobilinogen, UA: 0.2 mg/dL (ref 0.0–1.0)
pH: 5.5 (ref 5.0–8.0)

## 2014-07-28 MED ORDER — MORPHINE SULFATE 2 MG/ML IJ SOLN
5.0000 mg | Freq: Once | INTRAMUSCULAR | Status: AC
  Administered 2014-07-28: 5 mg via INTRAMUSCULAR

## 2014-07-28 MED ORDER — MORPHINE SULFATE 2 MG/ML IJ SOLN
INTRAMUSCULAR | Status: AC
  Filled 2014-07-28: qty 3

## 2014-07-28 MED ORDER — ONDANSETRON 8 MG PO TBDP: 8.0000 mg | ORAL_TABLET | Freq: Three times a day (TID) | ORAL | Status: DC | PRN

## 2014-07-28 MED ORDER — KETOROLAC TROMETHAMINE 60 MG/2ML IM SOLN
60.0000 mg | Freq: Once | INTRAMUSCULAR | Status: AC
  Administered 2014-07-28: 60 mg via INTRAMUSCULAR

## 2014-07-28 MED ORDER — ONDANSETRON 4 MG PO TBDP
ORAL_TABLET | ORAL | Status: AC
  Filled 2014-07-28: qty 2

## 2014-07-28 MED ORDER — ONDANSETRON 4 MG PO TBDP
8.0000 mg | ORAL_TABLET | Freq: Once | ORAL | Status: AC
  Administered 2014-07-28: 8 mg via ORAL

## 2014-07-28 MED ORDER — KETOROLAC TROMETHAMINE 60 MG/2ML IM SOLN
INTRAMUSCULAR | Status: AC
  Filled 2014-07-28: qty 2

## 2014-07-28 MED ORDER — TAMSULOSIN HCL 0.4 MG PO CAPS
0.4000 mg | ORAL_CAPSULE | Freq: Every day | ORAL | Status: DC
Start: 2014-07-28 — End: 2015-04-05

## 2014-07-28 MED ORDER — OXYCODONE-ACETAMINOPHEN 5-325 MG PO TABS: ORAL_TABLET | ORAL | Status: DC

## 2014-07-28 NOTE — ED Provider Notes (Signed)
Chief Complaint   Flank Pain   History of Present Illness   [name] [name] is a 48 year old female who experienced sudden onset of severe, sharp, stabbing left flank pain last night. The pain did not move or radiate. It's persisted through the day today. His back on a by nausea and sweats. She denies any fever, chills, or vomiting. No constipation, diarrhea, or hematochezia. She denies any urinary or GYN symptoms. She's never had anything like this before. No history of kidney infections or kidney stones. She denies any radiation the pain down the leg, numbness, tingling, or weakness in the leg.  Review of Systems   Other than as noted above, the patient denies any of the following symptoms: General:  No fevers or chills. GI:  No abdominal pain, back pain, nausea, or vomiting. GU:  No hematuria or incontinence. GYN:  No discharge, itching, vulvar pain or lesions, pelvic pain, or abnormal vaginal bleeding.  Rodeo   Past medical history, family history, social history, meds, and allergies were reviewed.  She is allergic to Diflucan, ibuprofen, and lisinopril. She takes medication for blood pressure. She had a segment of colon removed because of diverticulitis in 2008.  Physical Examination     Vital signs:  BP 159/96 mmHg  Pulse 94  Temp(Src) 98.1 F (36.7 C) (Oral)  Resp 16  SpO2 96%  LMP 06/02/2014 Gen:  Alert, oriented, in no distress. Lungs:  Clear to auscultation, no wheezes, rales or rhonchi. Heart:  Regular rhythm, no gallop or murmer. Abdomen:  Flat and soft. There was slight suprapubic pain to palpation.  No guarding, or rebound.  No hepato-splenomegaly or mass.  Bowel sounds were normally active.  No hernia. Back:  She has moderate left CVA tenderness.  Skin:  Clear, warm and dry.  Labs   Results for orders placed or performed during the hospital encounter of 07/28/14  POCT urinalysis dip (device)  Result Value Ref Range   Glucose, UA NEGATIVE NEGATIVE mg/dL    Bilirubin Urine NEGATIVE NEGATIVE   Ketones, ur NEGATIVE NEGATIVE mg/dL   Specific Gravity, Urine >=1.030 1.005 - 1.030   Hgb urine dipstick NEGATIVE NEGATIVE   pH 5.5 5.0 - 8.0   Protein, ur NEGATIVE NEGATIVE mg/dL   Urobilinogen, UA 0.2 0.0 - 1.0 mg/dL   Nitrite NEGATIVE NEGATIVE   Leukocytes, UA NEGATIVE NEGATIVE  Pregnancy, urine POC  Result Value Ref Range   Preg Test, Ur NEGATIVE NEGATIVE  I-STAT, chem 8  Result Value Ref Range   Sodium 141 137 - 147 mEq/L   Potassium 3.2 (L) 3.7 - 5.3 mEq/L   Chloride 102 96 - 112 mEq/L   BUN 9 6 - 23 mg/dL   Creatinine, Ser 1.00 0.50 - 1.10 mg/dL   Glucose, Bld 96 70 - 99 mg/dL   Calcium, Ion 1.16 1.12 - 1.23 mmol/L   TCO2 27 0 - 100 mmol/L   Hemoglobin 13.3 12.0 - 15.0 g/dL   HCT 39.0 36.0 - 46.0 %    Course in [facility] [place]   She was given morphine 5 mg IM and Toradol 60 mg IM as well as Zofran ODT 8 mg sublingually. Thereafter she felt better.  Assessment   The encounter diagnosis was Kidney stone.   No evidence of pyelonephritis.    Plan   1.  Meds:  The following meds were prescribed:   Discharge Medication List as of 07/28/2014  6:15 PM    START taking these medications   Details  ondansetron (ZOFRAN ODT) 8 MG disintegrating tablet Take 1 tablet (8 mg total) by mouth every 8 (eight) hours as needed for nausea., Starting 07/28/2014, Until Discontinued, Normal    tamsulosin (FLOMAX) 0.4 MG CAPS capsule Take 1 capsule (0.4 mg total) by mouth daily., Starting 07/28/2014, Until Discontinued, Normal        2.  Patient Education/Counseling:  The patient was given appropriate handouts, self care instructions, and instructed in symptomatic relief. The patient was told to strain all urine and get extra fluids and rest. If no better in 48 hours suggested she go to the emergency room for a CT scan. If she should get worse with fever, vomiting, dizziness, or more severe pain, should go immediately to the emergency department.     3.  Follow up:  The patient was told to follow up here if no better in 3 to 4 days, or sooner if becoming worse in any way, and given some red flag symptoms such as fever, persistent vomiting, or severe flank or abdominal pain which would prompt immediate return.     Harden Mo, MD 07/28/14 [id number]

## 2014-07-28 NOTE — ED Notes (Signed)
Pt  Reports  Symptoms  Of  l  Flank  Pain   With  Some  Nausea         denys  Any  Injury   -  Symptoms  Started  Yesterday      Worse  Today         Pt  States   Urine  Was  Neg  For  Blood  When  Checked   Earlier  Today         No  History  Of  Similar  Symptoms

## 2014-07-28 NOTE — Discharge Instructions (Signed)

## 2014-07-29 LAB — URINE CULTURE
Colony Count: >=100000
Special Requests: NORMAL

## 2014-08-12 ENCOUNTER — Other Ambulatory Visit: Payer: Self-pay | Admitting: Podiatry

## 2014-09-21 ENCOUNTER — Telehealth: Payer: Self-pay | Admitting: Internal Medicine

## 2014-09-21 ENCOUNTER — Encounter: Payer: Self-pay | Admitting: *Deleted

## 2014-09-21 NOTE — Telephone Encounter (Signed)
Left message for pt to call back.  Pt states she is having rectal pain and there is a lot of inflammation and itching. She reports doing sitz bathes everyday. Pt has history of anal fissures. Pt scheduled to see Cecille Rubin Hvozdovic, PA-C tomorrow at 2:45pm. Pt aware of appt.

## 2014-09-22 ENCOUNTER — Encounter: Payer: Self-pay | Admitting: Physician Assistant

## 2014-09-22 ENCOUNTER — Ambulatory Visit (INDEPENDENT_AMBULATORY_CARE_PROVIDER_SITE_OTHER): Payer: BLUE CROSS/BLUE SHIELD | Admitting: Physician Assistant

## 2014-09-22 VITALS — BP 124/74 | HR 80 | Ht 65.0 in | Wt 241.0 lb

## 2014-09-22 DIAGNOSIS — K602 Anal fissure, unspecified: Secondary | ICD-10-CM

## 2014-09-22 DIAGNOSIS — K648 Other hemorrhoids: Secondary | ICD-10-CM

## 2014-09-22 MED ORDER — DILTIAZEM GEL 2 %: 1.0000 "application " | Freq: Two times a day (BID) | CUTANEOUS | Status: DC

## 2014-09-22 MED ORDER — LIDOCAINE (ANORECTAL) 5 % EX CREA
TOPICAL_CREAM | CUTANEOUS | Status: DC
Start: 2014-09-22 — End: 2015-09-29

## 2014-09-22 MED ORDER — HYDROCORTISONE ACETATE 25 MG RE SUPP: 25.0000 mg | Freq: Two times a day (BID) | RECTAL | Status: DC

## 2014-09-22 NOTE — Patient Instructions (Signed)
We have sent the following medications to your pharmacy for you to pick up at your convenience: Anusol Suppositories Recticare cream- Coat suppository prior to insertion   We have sent diltiazem to [facility] for you to get.  You've been scheduled for an appointment with Arta Bruce, PA 10-13-14 @ 2:15 pm.  CC:Dr [facility]

## 2014-09-22 NOTE — Progress Notes (Addendum)
Patient ID: [name] [name], female   DOB: 07-Dec-1965, 49 y.o.   MRN: [id number]     History of Present Illness:   [name] [name] is a 49 year old female who is here today for follow-up. She is known to Dr. Hilarie Fredrickson, and has been treated in the past for hemorrhoids with Anusol suppositories. She was last seen here in November at which time she was found to have an anal fissure. She was prescribed diltiazem gel 2% and was instructed to use it twice daily for 2 weeks while using it she started to feel better, but shortly after discontinuing it she began to have recurrent discomfort when moving her bowels with rectal itching. She has had no bright red blood per rectum or melena, but she complains of a lump near her rectum. She has no fever or chills and no drainage.   Past Medical History  Diagnosis Date  . Hypertension   . Diverticulitis   . GERD (gastroesophageal reflux disease) 07/07/2005  . Hemorrhoid   . Hepatic steatosis   . Positive H. pylori test     Past Surgical History  Procedure Laterality Date  . Colon resection  2000's Dr. Hassell Done    Diverticulitis.  . G3p2 c-section1    . Tubal ligation    . Tonsillectomy     Family History  Problem Relation Age of Onset  . Hypertension Mother   . Hypertension Father   . Heart disease Father   . Diabetes Neg Hx   . COPD Neg Hx    History  Substance Use Topics  . Smoking status: Never Smoker   . Smokeless tobacco: Never Used  . Alcohol Use: No   Current Outpatient Prescriptions  Medication Sig Dispense Refill  . Cholecalciferol (VITAMIN D3) 5000 UNITS TABS Take 1 tablet by mouth daily.    . diclofenac (VOLTAREN) 75 MG EC tablet TAKE 1 TABLET BY MOUTH TWICE DAILY 50 tablet 0  . diltiazem 2 % GEL Apply 1 application topically 2 (two) times daily. X 4 weeks 30 g 0  . hydrochlorothiazide (HYDRODIURIL) 25 MG tablet Take 1 tablet (25 mg total) by mouth daily. 90 tablet 1  . Multiple Vitamin (MULTIVITAMIN WITH MINERALS) TABS tablet Take 1  tablet by mouth daily.    . ondansetron (ZOFRAN ODT) 8 MG disintegrating tablet Take 1 tablet (8 mg total) by mouth every 8 (eight) hours as needed for nausea. 12 tablet 0  . oxyCODONE-acetaminophen (PERCOCET) 5-325 MG per tablet 1 to 2 tablets every 6 hours as needed for pain. 20 tablet 0  . tamsulosin (FLOMAX) 0.4 MG CAPS capsule Take 1 capsule (0.4 mg total) by mouth daily. 7 capsule 0  . hydrocortisone (ANUSOL-HC) 25 MG suppository Place 1 suppository (25 mg total) rectally 2 (two) times daily. X 10 days 20 suppository 0  . Lidocaine, Anorectal, (RECTICARE) 5 % CREA Coat Anusol suppository prior to insertion 1 Tube 0   No current facility-administered medications for this visit.   Allergies  Allergen Reactions  . Dilaudid [Hydromorphone Hcl] Nausea Only  . Ibuprofen Swelling  . Lisinopril Swelling    REACTION: angioedema      Review of Systems: Gen: Denies any fever, chills, sweats, anorexia, fatigue, weakness, malaise, weight loss, and sleep disorder CV: Denies chest pain, angina, palpitations, syncope, orthopnea, PND, peripheral edema, and claudication. Resp: Denies dyspnea at rest, dyspnea with exercise, cough, sputum, wheezing, coughing up blood, and pleurisy. GI: Denies vomiting blood, jaundice, and fecal incontinence.   Denies dysphagia or odynophagia. GU :  Denies urinary burning, blood in urine, urinary frequency, urinary hesitancy, nocturnal urination, and urinary incontinence. MS: Denies joint pain, limitation of movement, and swelling, stiffness, low back pain, extremity pain. Denies muscle weakness, cramps, atrophy.  Derm: Denies rash, itching, dry skin, hives, moles, warts, or unhealing ulcers.  Psych: Denies depression, anxiety, memory loss, suicidal ideation, hallucinations, paranoia, and confusion. Heme: Denies bruising, bleeding, and enlarged lymph nodes. Neuro:  Denies any headaches, dizziness, paresthesia Endo:  Denies any problems with DM, thyroid,  adrenal   Physical Exam: General: Pleasant, well developed female in no acute distress Head: Normocephalic and atraumatic Eyes:  sclerae anicteric, conjunctiva pink  Ears: Normal auditory acuity Lungs: Clear throughout to auscultation Heart: Regular rate and rhythm Abdomen: Soft, non distended, non-tender. No masses, no hepatomegaly. Normal bowel sounds Rectal: no external hemorrhoids, brown stool heme neg. Anoscopy with fissure at 8:00, and internal hemorroids. Musculoskeletal: Symmetrical with no gross deformities  Extremities: No edema  Neurological: Alert oriented x 4, grossly nonfocal Psychological:  Alert and cooperative. Normal mood and affect  Assessment and Recommendations: 49 year old female with rectal pain and itching likely due to her hemorrhoids and fissure here for evaluation. She's been advised to use Tucks wipes. She will again use diltiazem ointment twice a day for 4-6 weeks. She will also be given a trial of Anusol HC suppositories 1 per rectum twice daily for 10 days. She has been advised to coat each suppository with rectal care 5% cream prior to insertion. She will follow up in 2-3 weeks, sooner if needed. If she continues to be troubled with her internal hemorrhoids, she may be a candidate for banding.    [name] [name], Vita Barley PA-C 09/22/2014,   Addendum: Reviewed and agree with initial management. Jerene Bears, MD

## 2014-10-13 ENCOUNTER — Ambulatory Visit: Payer: BLUE CROSS/BLUE SHIELD | Admitting: Physician Assistant

## 2014-10-22 ENCOUNTER — Encounter: Payer: Self-pay | Admitting: Physician Assistant

## 2014-10-22 ENCOUNTER — Ambulatory Visit: Payer: BLUE CROSS/BLUE SHIELD | Admitting: Physician Assistant

## 2014-10-22 NOTE — Progress Notes (Signed)
Patient ID: [name] [name], female   DOB: 1966-02-23, 49 y.o.   MRN: [id number] The patient's chart has been reviewed by Cecille Rubin Hvozdovic P.A.-C.  and the recommendations are noted below.      Follow-up advised. Schedule patient for next available appointment. Outcome of communication with the patient: letter mailed

## 2014-11-17 ENCOUNTER — Ambulatory Visit
Admission: RE | Admit: 2014-11-17 | Discharge: 2014-11-17 | Disposition: A | Discharge status: Home / Self Care | Payer: BLUE CROSS/BLUE SHIELD | Source: Ambulatory Visit | Attending: *Deleted | Admitting: *Deleted

## 2014-11-17 ENCOUNTER — Other Ambulatory Visit: Payer: Self-pay | Admitting: *Deleted

## 2014-11-17 DIAGNOSIS — R05 Cough: Secondary | ICD-10-CM

## 2014-11-17 DIAGNOSIS — R059 Cough, unspecified: Secondary | ICD-10-CM

## 2014-12-03 ENCOUNTER — Encounter: Payer: Self-pay | Admitting: Physician Assistant

## 2014-12-03 ENCOUNTER — Ambulatory Visit (INDEPENDENT_AMBULATORY_CARE_PROVIDER_SITE_OTHER): Payer: BLUE CROSS/BLUE SHIELD | Admitting: Physician Assistant

## 2014-12-03 VITALS — BP 190/104 | HR 80 | Ht 65.0 in | Wt 240.0 lb

## 2014-12-03 DIAGNOSIS — K648 Other hemorrhoids: Secondary | ICD-10-CM | POA: Diagnosis not present

## 2014-12-03 DIAGNOSIS — K602 Anal fissure, unspecified: Secondary | ICD-10-CM

## 2014-12-03 MED ORDER — LINACLOTIDE 145 MCG PO CAPS: 145.0000 ug | ORAL_CAPSULE | Freq: Every day | ORAL | Status: DC

## 2014-12-03 MED ORDER — DILTIAZEM GEL 2 %: 1.0000 "application " | Freq: Three times a day (TID) | CUTANEOUS | Status: DC

## 2014-12-03 NOTE — Progress Notes (Signed)
Patient ID: [name] [name], female   DOB: 1966-01-14, 49 y.o.   MRN: [id number]     History of Present Illness: [name] [name] is a delightful 49 year old female who is here today in follow-up. She is known to Dr. Hilarie Fredrickson and has been treated for hemorrhoids in the past. She was seen in November and found to have an anal fissure at that time she was prescribed diltiazem 2% and was instructed to use it twice daily for 2 weeks. She felt better while using it but shortly after discontinuing it she began to have recurrent discomfort when she moved her bowels with rectal itching. She was last seen here on February 2 at which time endoscopy revealed a fissure and internal hemorrhoids she was restarted on diltiazem ointment twice daily and given a trial of Anusol HC suppositories she is here today in follow-up the sharp pain she was having from the fissure began to get better with the diltiazem but then she had a hard bowel movement and it recurred. She feels what is causing her the most discomfort is some internal hemorrhoids that prolapse and she has to push them back up. She has had no bright red blood per rectum and no melena. She is often constipated and uses 2 fiber capsules a day. She has been instructed to use Mira lax in the past but doesn't like to use it. She has been using her diltiazem fairly regularly but admits there are some days where she can only use it once a day.   Past Medical History  Diagnosis Date  . Hypertension   . Diverticulitis   . GERD (gastroesophageal reflux disease) 07/07/2005  . Hemorrhoid   . Hepatic steatosis   . Positive H. pylori test     Past Surgical History  Procedure Laterality Date  . Colon resection  2000's Dr. Hassell Done    Diverticulitis.  . G3p2 c-section1    . Tubal ligation    . Tonsillectomy     Family History  Problem Relation Age of Onset  . Hypertension Mother   . Hypertension Father   . Heart disease Father   . Diabetes Neg Hx   . COPD Neg Hx     History  Substance Use Topics  . Smoking status: Never Smoker   . Smokeless tobacco: Never Used  . Alcohol Use: No   Current Outpatient Prescriptions  Medication Sig Dispense Refill  . Cholecalciferol (VITAMIN D3) 5000 UNITS TABS Take 1 tablet by mouth daily.    . diclofenac (VOLTAREN) 75 MG EC tablet TAKE 1 TABLET BY MOUTH TWICE DAILY 50 tablet 0  . diltiazem 2 % GEL Apply 1 application topically 3 (three) times daily. X 6 weeks 30 g 0  . hydrochlorothiazide (HYDRODIURIL) 25 MG tablet Take 1 tablet (25 mg total) by mouth daily. 90 tablet 1  . hydrocortisone (ANUSOL-HC) 25 MG suppository Place 1 suppository (25 mg total) rectally 2 (two) times daily. X 10 days 20 suppository 0  . Lidocaine, Anorectal, (RECTICARE) 5 % CREA Coat Anusol suppository prior to insertion 1 Tube 0  . Multiple Vitamin (MULTIVITAMIN WITH MINERALS) TABS tablet Take 1 tablet by mouth daily.    . ondansetron (ZOFRAN ODT) 8 MG disintegrating tablet Take 1 tablet (8 mg total) by mouth every 8 (eight) hours as needed for nausea. 12 tablet 0  . oxyCODONE-acetaminophen (PERCOCET) 5-325 MG per tablet 1 to 2 tablets every 6 hours as needed for pain. 20 tablet 0  . tamsulosin (FLOMAX) 0.4  MG CAPS capsule Take 1 capsule (0.4 mg total) by mouth daily. 7 capsule 0  . Linaclotide (LINZESS) 145 MCG CAPS capsule Take 1 capsule (145 mcg total) by mouth daily. 30 capsule 4   No current facility-administered medications for this visit.   Allergies  Allergen Reactions  . Dilaudid [Hydromorphone Hcl] Nausea Only  . Ibuprofen Swelling  . Lisinopril Swelling    REACTION: angioedema      Review of Systems: Gen: Denies any fever, chills, sweats, anorexia, fatigue, weakness, malaise, weight loss, and sleep disorder CV: Denies chest pain, angina, palpitations, syncope, orthopnea, PND, peripheral edema, and claudication. Resp: Denies dyspnea at rest, dyspnea with exercise, cough, sputum, wheezing, coughing up blood, and  pleurisy. GI: Denies vomiting blood, jaundice, and fecal incontinence.   Denies dysphagia or odynophagia. GU : Denies urinary burning, blood in urine, urinary frequency, urinary hesitancy, nocturnal urination, and urinary incontinence. MS: Denies joint pain, limitation of movement, and swelling, stiffness, low back pain, extremity pain. Denies muscle weakness, cramps, atrophy.  Derm: Denies rash, itching, dry skin, hives, moles, warts, or unhealing ulcers.  Psych: Denies depression, anxiety, memory loss, suicidal ideation, hallucinations, paranoia, and confusion. Heme: Denies bruising, bleeding, and enlarged lymph nodes. Neuro:  Denies any headaches, dizziness, paresthesia Endo:  Denies any problems with DM, thyroid, adrenal    Physical Exam: General: well developed ,  AAfemale in no acute distress, who is upset when I entered the room because she said she had "an attitude from a staff member". Head: Normocephalic and atraumatic Eyes:  sclerae anicteric, conjunctiva pink  Ears: Normal auditory acuity Lungs: Clear throughout to auscultation Heart: Regular rate and rhythm Abdomen: Soft, non distended, non-tender. No masses, no hepatomegaly. Normal bowel sounds Rectal: No external hemorrhoids noted. DRE did not produce pain for the patient anoscopy performed and she was noted to have right anterior and some posterior internal hemorrhoids. I did not appreciate a fissure on my exam however Dr. Hilarie Fredrickson was in to see the patient for banding later and on exam noted a posterior fissure. Musculoskeletal: Symmetrical with no gross deformities  Extremities: No edema  Neurological: Alert oriented x 4, grossly nonfocal Psychological:  Alert and cooperative. Normal mood and affect  Assessment and Recommendations: 49 year old female with rectal pain and discomfort likely from her fissure and hemorrhoids. Banding was performed of 1 column of internal hemorrhoids today and the patient tolerated the procedure  well. She has been instructed to use diltiazem ointment for her fissure 2-3 times daily for 6-8 weeks. In addition for her hard stools she's been instructed to increase fiber and water in her diet she will also be given a trial of Linzess 145 g daily. He will return in 2-3 weeks to follow up with Dr. Hilarie Fredrickson and have banding of an additional column of internal hemorrhoids.   [name] [name], Vita Barley PA-C 12/03/2014,  Addendum: Reviewed and agree with management.  PROCEDURE NOTE: After discussion and examination today decision made to band internal hemorrhoids which are symptomatic with prolapsing and bleeding. Anal fissure also symptomatic primarily with pain in the anal canal with passing stool and throbbing thereafter. Part of the issue is ongoing constipation and inconsistency with treatment. We'll begin Linzess 145 g daily to try to prevent constipation. I've also encouraged her to use diltiazem on a regular basis 2-3 times a day for at least 6 weeks. Fissure needs to heal and we can continue to treat internal hemorrhoids. The patient presents with symptomatic grade 2-3  hemorrhoids, requesting rubber band ligation  of her hemorrhoidal disease.  All risks, benefits and alternative forms of therapy were described and informed consent was obtained.  In the Left Lateral Decubitus position anoscopic examination revealed grade 2 hemorrhoids in the RA>LL position.  there was slightly raised area and anal can now in the posterior position consistent with fissure, fissure was seen on anoscopy The anorectum was pre-medicated with 0.125% NTG gel The decision was made to band the RA internal hemorrhoid, and the [place] was used to perform band ligation without complication.  Digital anorectal examination was then performed to assure proper positioning of the band, and to adjust the banded tissue as required.  The patient was discharged home without pain or other issues.  Dietary and behavioral  recommendations were given and along with follow-up instructions.     The following adjunctive treatments were recommended: --Consistent use of diltiazem as above  --Begin Linzess as above   The patient will return in 2-4 weeks for  follow-up and possible additional banding as required. No complications were encountered and the patient tolerated the procedure well.     Jerene Bears, MD

## 2014-12-03 NOTE — Patient Instructions (Addendum)
We have sent the following medications to your pharmacy for you to pick up at your convenience:  Diltiazem ([place]) and Linzess [employer])    ou have been scheduled for your 2nd banding on 01/19/2015 at 4:00pm   [place]   1. The procedure you have had should have been relatively painless since the banding of the area involved does not have nerve endings and there is no pain sensation.  The rubber band cuts off the blood supply to the hemorrhoid and the band may fall off as soon as 48 hours after the banding (the band may occasionally be seen in the toilet bowl following a bowel movement). You may notice a temporary feeling of fullness in the rectum which should respond adequately to plain Tylenol or Motrin.  2. Following the banding, avoid strenuous exercise that evening and resume full activity the next day.  A sitz bath (soaking in a warm tub) or bidet is soothing, and can be useful for cleansing the area after bowel movements.     3. To avoid constipation, take two tablespoons of natural wheat bran, natural oat bran, flax, Benefiber or any over the counter fiber supplement and increase your water intake to 7-8 glasses daily.    4. Unless you have been prescribed anorectal medication, do not put anything inside your rectum for two weeks: No suppositories, enemas, fingers, etc.  5. Occasionally, you may have more bleeding than usual after the banding procedure.  This is often from the untreated hemorrhoids rather than the treated one.  Don't be concerned if there is a tablespoon or so of blood.  If there is more blood than this, lie flat with your bottom higher than your head and apply an ice pack to the area. If the bleeding does not stop within a half an hour or if you feel faint, call our office at [phone] or go to the emergency room.  6. Problems are not common; however, if there is a substantial amount of bleeding, severe pain, chills,  fever or difficulty passing urine (very rare) or other problems, you should call us at [phone] or report to the nearest emergency room.  7. Do not stay seated continuously for more than 2-3 hours for a day or two after the procedure.  Tighten your buttock muscles 10-15 times every two hours and take 10-15 deep breaths every 1-2 hours.  Do not spend more than a few minutes on the toilet if you cannot empty your bowel; instead re-visit the toilet at a later time.

## 2014-12-10 ENCOUNTER — Other Ambulatory Visit: Payer: Self-pay | Admitting: Podiatry

## 2014-12-21 ENCOUNTER — Encounter: Payer: Self-pay | Admitting: *Deleted

## 2015-01-19 ENCOUNTER — Ambulatory Visit (INDEPENDENT_AMBULATORY_CARE_PROVIDER_SITE_OTHER): Payer: BLUE CROSS/BLUE SHIELD | Admitting: Internal Medicine

## 2015-01-19 ENCOUNTER — Encounter: Payer: Self-pay | Admitting: Internal Medicine

## 2015-01-19 VITALS — BP 128/82 | HR 102 | Ht 66.0 in | Wt 245.0 lb

## 2015-01-19 DIAGNOSIS — R151 Fecal smearing: Secondary | ICD-10-CM

## 2015-01-19 DIAGNOSIS — K648 Other hemorrhoids: Secondary | ICD-10-CM

## 2015-01-19 NOTE — Progress Notes (Signed)
Patient ID: [name] [name], female   DOB: 02-09-1966, 49 y.o.   MRN: [id number] Mrs. Kenedy returns to consider hemorrhoidal banding #2. Initial banding performed on 12/02/2024 for symptomatic hemorrhoids. Also with history of fissure She reports she is feeling much better today. She's had less fecal smearing resolution of perianal itching and no further bleeding. She has stopped using the topical diltiazem but continues MiraLAX 34 g daily She is very interested in continuing with banding  PROCEDURE NOTE: The patient presents with symptomatic grade 2  hemorrhoids, requesting rubber band ligation of her hemorrhoidal disease.  All risks, benefits and alternative forms of therapy were described and informed consent was obtained.   The anorectum was pre-medicated with 0.125% nitroglycerin ointment The decision was made to band the LL (RA initially) internal hemorrhoid, and the [place] was used to perform band ligation without complication.  Digital anorectal examination was then performed to assure proper positioning of the band, and to adjust the banded tissue as required.  The patient was discharged home without pain or other issues.  Dietary and behavioral recommendations were given and along with follow-up instructions.     The following adjunctive treatments were recommended: Continue MiraLAX 34 g daily  The patient will return in 2-6 weeks for  follow-up and possible additional banding as required. No complications were encountered and the patient tolerated the procedure well.

## 2015-01-19 NOTE — Patient Instructions (Signed)
Continue taking 34 gm of Miralax daily.  If your anal pain comes back you may use Diltiazem  Your next banding appointment is 02/25/2015 at 2:15pm   [place]   1. The procedure you have had should have been relatively painless since the banding of the area involved does not have nerve endings and there is no pain sensation.  The rubber band cuts off the blood supply to the hemorrhoid and the band may fall off as soon as 48 hours after the banding (the band may occasionally be seen in the toilet bowl following a bowel movement). You may notice a temporary feeling of fullness in the rectum which should respond adequately to plain Tylenol or Motrin.  2. Following the banding, avoid strenuous exercise that evening and resume full activity the next day.  A sitz bath (soaking in a warm tub) or bidet is soothing, and can be useful for cleansing the area after bowel movements.     3. To avoid constipation, take two tablespoons of natural wheat bran, natural oat bran, flax, Benefiber or any over the counter fiber supplement and increase your water intake to 7-8 glasses daily.    4. Unless you have been prescribed anorectal medication, do not put anything inside your rectum for two weeks: No suppositories, enemas, fingers, etc.  5. Occasionally, you may have more bleeding than usual after the banding procedure.  This is often from the untreated hemorrhoids rather than the treated one.  Don't be concerned if there is a tablespoon or so of blood.  If there is more blood than this, lie flat with your bottom higher than your head and apply an ice pack to the area. If the bleeding does not stop within a half an hour or if you feel faint, call our office at [phone] or go to the emergency room.  6. Problems are not common; however, if there is a substantial amount of bleeding, severe pain, chills, fever or difficulty passing urine (very rare) or other problems,  you should call us at [phone] or report to the nearest emergency room.  7. Do not stay seated continuously for more than 2-3 hours for a day or two after the procedure.  Tighten your buttock muscles 10-15 times every two hours and take 10-15 deep breaths every 1-2 hours.  Do not spend more than a few minutes on the toilet if you cannot empty your bowel; instead re-visit the toilet at a later time.

## 2015-02-25 ENCOUNTER — Encounter: Payer: BLUE CROSS/BLUE SHIELD | Admitting: Internal Medicine

## 2015-04-05 ENCOUNTER — Encounter: Payer: Self-pay | Admitting: Internal Medicine

## 2015-04-05 ENCOUNTER — Ambulatory Visit (INDEPENDENT_AMBULATORY_CARE_PROVIDER_SITE_OTHER): Payer: BLUE CROSS/BLUE SHIELD | Admitting: Internal Medicine

## 2015-04-05 VITALS — BP 136/72 | HR 101 | Ht 65.0 in

## 2015-04-05 DIAGNOSIS — K648 Other hemorrhoids: Secondary | ICD-10-CM

## 2015-04-05 DIAGNOSIS — K6289 Other specified diseases of anus and rectum: Secondary | ICD-10-CM

## 2015-04-05 MED ORDER — DILTIAZEM GEL 2 %: 1.0000 "application " | Freq: Three times a day (TID) | CUTANEOUS | Status: DC

## 2015-04-05 NOTE — Patient Instructions (Addendum)
Please use diltiazem three times daily x 2 more weeks, then as needed thereafter.

## 2015-04-05 NOTE — Progress Notes (Signed)
Patient ID: PATTI SHORB, female   DOB: Jan 06, 1966, 49 y.o.   MRN: [id number] Mrs. Biggs returns to consider hemorrhoidal banding #3 Initial banding performed on 12/03/2014 and 01/19/2015 Has been very impressed with the results with no further rectal bleeding, resolution perianal itching and resolution of fecal smearing Bowel movements have been regular on MiraLAX 34 g daily Over the last 3 weeks she's noticed anal pain with sitting. Not necessarily painful with passing soft stool and no bleeding  PROCEDURE NOTE: The patient presents with symptomatic grade 2 internal hemorrhoids, requesting rubber band ligation of her hemorrhoidal disease.  All risks, benefits and alternative forms of therapy were described and informed consent was obtained.   The anorectum was pre-medicated with 0.125% nitroglycerin ointment Rectal exam reveals posterior midline tenderness without mass The decision was made to band the right posterior internal hemorrhoid (band #1 right anterior, then #2 left lateral), and the [place] was used to perform band ligation without complication.  Digital anorectal examination was then performed to assure proper positioning of the band, and to adjust the banded tissue as required.  The patient was discharged home without pain or other issues.  Dietary and behavioral recommendations were given and along with follow-up instructions.     The following adjunctive treatments were recommended: Restart diltiazem gel 2% 3 times daily for 2 weeks for anal fissure with element of anal spasm, call if not improving   continue MiraLAX 34 g daily  The patient will return as needed Repeat colonoscopy 2018 for screening No complications were encountered and the patient tolerated the procedure well.

## 2015-04-27 ENCOUNTER — Emergency Department (HOSPITAL_COMMUNITY)
Admission: EM | Admit: 2015-04-27 | Discharge: 2015-04-27 | Disposition: A | Discharge status: Home / Self Care | Payer: BLUE CROSS/BLUE SHIELD | Source: Home / Self Care | Attending: Emergency Medicine | Admitting: Emergency Medicine

## 2015-04-27 ENCOUNTER — Encounter (HOSPITAL_COMMUNITY): Payer: Self-pay | Admitting: Emergency Medicine

## 2015-04-27 DIAGNOSIS — M940 Chondrocostal junction syndrome [Tietze]: Secondary | ICD-10-CM | POA: Diagnosis not present

## 2015-04-27 MED ORDER — METHYLPREDNISOLONE ACETATE 80 MG/ML IJ SUSP
INTRAMUSCULAR | Status: AC
Start: 2015-04-27 — End: 2015-04-27
  Filled 2015-04-27: qty 1

## 2015-04-27 MED ORDER — METHYLPREDNISOLONE ACETATE 80 MG/ML IJ SUSP
80.0000 mg | Freq: Once | INTRAMUSCULAR | Status: AC
  Administered 2015-04-27: 80 mg via INTRAMUSCULAR

## 2015-04-27 NOTE — ED Notes (Signed)
Left side pain that started on Wednesday 8/31.  Pain stopped, then pain reoccurred Saturday 9/3.  Pain has increased over time.  Now has intermittent nausea.

## 2015-04-27 NOTE — ED Provider Notes (Signed)
CSN: [id number]     Arrival date & time 04/27/15  1831 History   First MD Initiated Contact with Patient 04/27/15 1926     Chief Complaint  Patient presents with  . Back Pain   (Consider location/radiation/quality/duration/timing/severity/associated sxs/prior Treatment) HPI Comments: [age]-year-old obese female complaining of left flank and left lateral chest pain for one week. It seemed to improve after the first 3-4 days but then returned again 3 days ago. The patient points to the lower left back, left side of the chest and abdomen as the source of pain. It is worse when she sits up straight, movement and bending. Denies abdominal pain. She occasionally has transient nausea but no vomiting. She denies any known injury. Denies any known movement or activity that would have caused the pain. Denies chest pain, heaviness, tightness, fullness or pressure in the chest. Described as a sharp and achy, stabbing pain.   Past Medical History  Diagnosis Date  . Hypertension   . Diverticulitis   . GERD (gastroesophageal reflux disease) 07/07/2005  . Hemorrhoid   . Hepatic steatosis   . Positive H. pylori test   . Internal hemorrhoids   . Anal fissure    Past Surgical History  Procedure Laterality Date  . Colon resection  2000's Dr. Hassell Done    Diverticulitis.  . G3p2 c-section1    . Tubal ligation    . Tonsillectomy     Family History  Problem Relation Age of Onset  . Hypertension Mother   . Hypertension Father   . Heart disease Father   . Diabetes Neg Hx   . COPD Neg Hx    Social History  Substance Use Topics  . Smoking status: Never Smoker   . Smokeless tobacco: Never Used  . Alcohol Use: No   OB History    No data available     Review of Systems  Constitutional: Positive for activity change. Negative for fever and fatigue.  Gastrointestinal: Positive for nausea. Negative for vomiting, abdominal pain, constipation and abdominal distention.  Genitourinary: Negative.    Musculoskeletal: Positive for back pain. Negative for myalgias, joint swelling, gait problem and neck pain.  Skin: Negative.   Neurological: Negative.   All other systems reviewed and are negative.   Allergies  Dilaudid; Ibuprofen; and Lisinopril  Home Medications   Prior to Admission medications   Medication Sig Start Date End Date Taking? Authorizing Provider  cyclobenzaprine (FLEXERIL) 10 MG tablet Take 10 mg by mouth 3 (three) times daily as needed for muscle spasms.   Yes Historical Provider, MD  TRAMADOL HCL PO Take by mouth.   Yes Historical Provider, MD  Cholecalciferol (VITAMIN D3) 5000 UNITS TABS Take 1 tablet by mouth daily.    Historical Provider, MD  diclofenac (VOLTAREN) 75 MG EC tablet TAKE 1 TABLET BY MOUTH TWICE DAILY 08/12/14   Tamala Fothergill Regal, DPM  diltiazem 2 % GEL Apply 1 application topically 3 (three) times daily. 04/05/15   Jerene Bears, MD  hydrochlorothiazide (HYDRODIURIL) 25 MG tablet Take 1 tablet (25 mg total) by mouth daily. 06/16/14   Olga Millers, MD  hydrocortisone (ANUSOL-HC) 25 MG suppository Place 1 suppository (25 mg total) rectally 2 (two) times daily. X 10 days 09/22/14   Lori P Hvozdovic, PA-C  Lidocaine, Anorectal, (RECTICARE) 5 % CREA Coat Anusol suppository prior to insertion 09/22/14   Lori P Hvozdovic, PA-C  Multiple Vitamin (MULTIVITAMIN WITH MINERALS) TABS tablet Take 1 tablet by mouth daily.    Historical  Provider, MD  ondansetron (ZOFRAN ODT) 8 MG disintegrating tablet Take 1 tablet (8 mg total) by mouth every 8 (eight) hours as needed for nausea. 07/28/14   Harden Mo, MD  polyethylene glycol powder (GLYCOLAX/MIRALAX) powder 17 grams twice daily    Historical Provider, MD   Meds Ordered and Administered this Visit   Medications  methylPREDNISolone acetate (DEPO-MEDROL) injection 80 mg (not administered)    BP 146/99 mmHg  Pulse 90  Temp(Src) 98.4 F (36.9 C) (Oral)  Resp 16  SpO2 98%  LMP 04/02/2015 No data  found.   Physical Exam  Constitutional: She is oriented to person, place, and time. She appears well-developed and well-nourished. No distress.  HENT:  Head: Normocephalic and atraumatic.  Eyes: EOM are normal. Pupils are equal, round, and reactive to light.  Neck: Normal range of motion. Neck supple.  Cardiovascular: Normal rate, regular rhythm and normal heart sounds.   Pulmonary/Chest: Effort normal and breath sounds normal. No respiratory distress. She has no wheezes. She exhibits tenderness.  Abdominal: Soft. There is no tenderness. There is no rebound and no guarding.  Musculoskeletal: She exhibits tenderness.  There is marked, reproducible pain to the left anterior lower costal margin that tracks to the lateral and posterior aspects of the rib cage. This again reproduces the same pain for which she presents. There is no abdominal pain or tenderness.  Neurological: She is alert and oriented to person, place, and time. No cranial nerve deficit.  Skin: Skin is warm and dry.  Nursing note and vitals reviewed.   ED Course  Procedures (including critical care time)  Labs Review Labs Reviewed - No data to display  Imaging Review No results found.   Visual Acuity Review  Right Eye Distance:   Left Eye Distance:   Bilateral Distance:    Right Eye Near:   Left Eye Near:    Bilateral Near:         MDM   1. Costochondritis    Patient is allergic to Motrin as it causes her to swell up. Therefore, she will not receive any NSAIDs here or by prescription. She has tried tramadol at home and it did not help. Solu-Medrol 80 mg IM Patient states that she did not want to tablets she just wanted the injection. She was advised that she cannot take the medicine which would help her the most which included NSAIDs. She can take 2 of the tramadol and place ice to the chest wall for pain control. Follow-up with your primary care doctor. u may try taking 2 of your tramadol 50 mg at a  time every 6 hours as needed for pain. Apply ice to the side of the rib cage that is hurting. Solumedrol 80 mg IM now     Janne Napoleon, NP 04/27/15 1948

## 2015-04-27 NOTE — Discharge Instructions (Signed)
Costochondritis You may try taking 2 of your tramadol 50 mg at a time every 6 hours as needed for pain. Apply ice to the side of the rib cage that is hurting. Solumedrol 80 mg IM now Costochondritis, sometimes called Tietze syndrome, is a swelling and irritation (inflammation) of the tissue (cartilage) that connects your ribs with your breastbone (sternum). It causes pain in the chest and rib area. Costochondritis usually goes away on its own over time. It can take up to 6 weeks or longer to get better, especially if you are unable to limit your activities. CAUSES  Some cases of costochondritis have no known cause. Possible causes include:  Injury (trauma).  Exercise or activity such as lifting.  Severe coughing. SIGNS AND SYMPTOMS  Pain and tenderness in the chest and rib area.  Pain that gets worse when coughing or taking deep breaths.  Pain that gets worse with specific movements. DIAGNOSIS  Your health care provider will do a physical exam and ask about your symptoms. Chest X-rays or other tests may be done to rule out other problems. TREATMENT  Costochondritis usually goes away on its own over time. Your health care provider may prescribe medicine to help relieve pain. HOME CARE INSTRUCTIONS   Avoid exhausting physical activity. Try not to strain your ribs during normal activity. This would include any activities using chest, abdominal, and side muscles, especially if heavy weights are used.  Apply ice to the affected area for the first 2 days after the pain begins.  Put ice in a plastic bag.  Place a towel between your skin and the bag.  Leave the ice on for 20 minutes, 2-3 times a day.  Only take over-the-counter or prescription medicines as directed by your health care provider. SEEK MEDICAL CARE IF:  You have redness or swelling at the rib joints. These are signs of infection.  Your pain does not go away despite rest or medicine. SEEK IMMEDIATE MEDICAL CARE IF:    Your pain increases or you are very uncomfortable.  You have shortness of breath or difficulty breathing.  You cough up blood.  You have worse chest pains, sweating, or vomiting.  You have a fever or persistent symptoms for more than 2-3 days.  You have a fever and your symptoms suddenly get worse. MAKE SURE YOU:   Understand these instructions.  Will watch your condition.  Will get help right away if you are not doing well or get worse. Document Released: 05/17/2005 Document Revised: 05/28/2013 Document Reviewed: 03/11/2013 [facility] Patient Information 2015 [place], [place]. This information is not intended to replace advice given to you by your health care provider. Make sure you discuss any questions you have with your health care provider.

## 2015-05-05 ENCOUNTER — Telehealth: Payer: Self-pay | Admitting: Internal Medicine

## 2015-05-05 NOTE — Telephone Encounter (Signed)
Pt states she is having abdominal pain on her left side under her ribs. States she was seen in the ER and by her PCP and nothing has helped, she is still hurting. Pt is concerned and wants to make sure it is not diverticulitis. Pt scheduled to see Cecille Rubin Hvozdovic, PA-C tomorrow at 1:45pm. Pt aware of appt.

## 2015-05-06 ENCOUNTER — Ambulatory Visit: Payer: BLUE CROSS/BLUE SHIELD | Admitting: Physician Assistant

## 2015-05-12 ENCOUNTER — Other Ambulatory Visit: Payer: Self-pay | Admitting: Internal Medicine

## 2015-06-29 ENCOUNTER — Emergency Department (HOSPITAL_COMMUNITY)
Admission: EM | Admit: 2015-06-29 | Discharge: 2015-06-29 | Discharge status: Against Medical Advice | Payer: BLUE CROSS/BLUE SHIELD | Attending: Emergency Medicine | Admitting: Emergency Medicine

## 2015-06-29 ENCOUNTER — Encounter (HOSPITAL_COMMUNITY): Payer: Self-pay | Admitting: Emergency Medicine

## 2015-06-29 DIAGNOSIS — R079 Chest pain, unspecified: Secondary | ICD-10-CM | POA: Insufficient documentation

## 2015-06-29 DIAGNOSIS — R110 Nausea: Secondary | ICD-10-CM | POA: Insufficient documentation

## 2015-06-29 DIAGNOSIS — I10 Essential (primary) hypertension: Secondary | ICD-10-CM | POA: Insufficient documentation

## 2015-06-29 LAB — COMPREHENSIVE METABOLIC PANEL
ALT: 30 U/L (ref 14–54)
AST: 32 U/L (ref 15–41)
Albumin: 4 g/dL (ref 3.5–5.0)
Alkaline Phosphatase: 89 U/L (ref 38–126)
Anion gap: 8 (ref 5–15)
BUN: 12 mg/dL (ref 6–20)
CO2: 30 mmol/L (ref 22–32)
Calcium: 9.3 mg/dL (ref 8.9–10.3)
Chloride: 101 mmol/L (ref 101–111)
Creatinine, Ser: 1.13 mg/dL — ABNORMAL HIGH (ref 0.44–1.00)
GFR calc Af Amer: >60 mL/min (ref >60)
GFR calc non Af Amer: 55 mL/min — ABNORMAL LOW (ref >60)
Glucose, Bld: 116 mg/dL — ABNORMAL HIGH (ref 65–99)
Potassium: 3.1 mmol/L — ABNORMAL LOW (ref 3.5–5.1)
Sodium: 139 mmol/L (ref 135–145)
Total Bilirubin: 0.5 mg/dL (ref 0.3–1.2)
Total Protein: 7.6 g/dL (ref 6.5–8.1)

## 2015-06-29 LAB — CBC
HCT: 37.3 % (ref 36.0–46.0)
Hemoglobin: 12.5 g/dL (ref 12.0–15.0)
MCH: 29.9 pg (ref 26.0–34.0)
MCHC: 33.5 g/dL (ref 30.0–36.0)
MCV: 89.2 fL (ref 78.0–100.0)
Platelets: 152 10*3/uL (ref 150–400)
RBC: 4.18 MIL/uL (ref 3.87–5.11)
RDW: 13.1 % (ref 11.5–15.5)
WBC: 7.6 10*3/uL (ref 4.0–10.5)

## 2015-06-29 LAB — I-STAT TROPONIN, ED: Troponin i, poc: 0.01 ng/mL (ref 0.00–0.08)

## 2015-06-29 NOTE — ED Notes (Signed)
Pt c/o centralized chest pain since Friday, states today she notced a knot along with worsening of pain. Pt states the pain radiates around right side and into arm. Pt denies SOB but c/o nausea.

## 2015-06-29 NOTE — ED Notes (Signed)
Patient has been called x 3 times with no answer.

## 2015-09-29 ENCOUNTER — Encounter (HOSPITAL_COMMUNITY): Payer: Self-pay | Admitting: Emergency Medicine

## 2015-09-29 ENCOUNTER — Emergency Department (HOSPITAL_COMMUNITY)
Admission: EM | Admit: 2015-09-29 | Discharge: 2015-09-30 | Disposition: A | Discharge status: Home / Self Care | Payer: BLUE CROSS/BLUE SHIELD | Attending: Emergency Medicine | Admitting: Emergency Medicine

## 2015-09-29 ENCOUNTER — Encounter (HOSPITAL_COMMUNITY): Payer: Self-pay | Admitting: *Deleted

## 2015-09-29 ENCOUNTER — Emergency Department (HOSPITAL_COMMUNITY)
Admission: EM | Admit: 2015-09-29 | Discharge: 2015-09-29 | Disposition: A | Discharge status: Other Acute Inpatient Hospital | Payer: BLUE CROSS/BLUE SHIELD | Source: Home / Self Care | Attending: Family Medicine | Admitting: Family Medicine

## 2015-09-29 DIAGNOSIS — Z791 Long term (current) use of non-steroidal anti-inflammatories (NSAID): Secondary | ICD-10-CM | POA: Insufficient documentation

## 2015-09-29 DIAGNOSIS — R1031 Right lower quadrant pain: Secondary | ICD-10-CM

## 2015-09-29 DIAGNOSIS — N390 Urinary tract infection, site not specified: Secondary | ICD-10-CM | POA: Insufficient documentation

## 2015-09-29 DIAGNOSIS — E876 Hypokalemia: Secondary | ICD-10-CM | POA: Diagnosis not present

## 2015-09-29 DIAGNOSIS — R110 Nausea: Secondary | ICD-10-CM | POA: Insufficient documentation

## 2015-09-29 DIAGNOSIS — I10 Essential (primary) hypertension: Secondary | ICD-10-CM | POA: Diagnosis not present

## 2015-09-29 DIAGNOSIS — Z79899 Other long term (current) drug therapy: Secondary | ICD-10-CM | POA: Insufficient documentation

## 2015-09-29 DIAGNOSIS — Z3202 Encounter for pregnancy test, result negative: Secondary | ICD-10-CM | POA: Diagnosis not present

## 2015-09-29 DIAGNOSIS — Z8719 Personal history of other diseases of the digestive system: Secondary | ICD-10-CM | POA: Diagnosis not present

## 2015-09-29 DIAGNOSIS — Z9851 Tubal ligation status: Secondary | ICD-10-CM | POA: Diagnosis not present

## 2015-09-29 DIAGNOSIS — R109 Unspecified abdominal pain: Secondary | ICD-10-CM | POA: Diagnosis not present

## 2015-09-29 DIAGNOSIS — Z8619 Personal history of other infectious and parasitic diseases: Secondary | ICD-10-CM | POA: Diagnosis not present

## 2015-09-29 DIAGNOSIS — D696 Thrombocytopenia, unspecified: Secondary | ICD-10-CM | POA: Diagnosis not present

## 2015-09-29 LAB — CBC WITH DIFFERENTIAL/PLATELET
Basophils Absolute: 0 10*3/uL (ref 0.0–0.1)
Basophils Relative: 0 %
Eosinophils Absolute: 0 10*3/uL (ref 0.0–0.7)
Eosinophils Relative: 1 %
HCT: 37.3 % (ref 36.0–46.0)
Hemoglobin: 12.6 g/dL (ref 12.0–15.0)
Lymphocytes Relative: 25 %
Lymphs Abs: 1.6 10*3/uL (ref 0.7–4.0)
MCH: 30 pg (ref 26.0–34.0)
MCHC: 33.8 g/dL (ref 30.0–36.0)
MCV: 88.8 fL (ref 78.0–100.0)
Monocytes Absolute: 0.5 10*3/uL (ref 0.1–1.0)
Monocytes Relative: 7 %
Neutro Abs: 4.4 10*3/uL (ref 1.7–7.7)
Neutrophils Relative %: 67 %
Platelets: 126 10*3/uL — ABNORMAL LOW (ref 150–400)
RBC: 4.2 MIL/uL (ref 3.87–5.11)
RDW: 13.4 % (ref 11.5–15.5)
WBC: 6.5 10*3/uL (ref 4.0–10.5)

## 2015-09-29 LAB — URINALYSIS, ROUTINE W REFLEX MICROSCOPIC
Glucose, UA: NEGATIVE mg/dL
Hgb urine dipstick: NEGATIVE
Ketones, ur: NEGATIVE mg/dL
Leukocytes, UA: NEGATIVE
Nitrite: NEGATIVE
Protein, ur: 30 mg/dL — AB
Specific Gravity, Urine: 1.037 — ABNORMAL HIGH (ref 1.005–1.030)
pH: 5 (ref 5.0–8.0)

## 2015-09-29 LAB — COMPREHENSIVE METABOLIC PANEL
ALT: 30 U/L (ref 14–54)
AST: 32 U/L (ref 15–41)
Albumin: 3.9 g/dL (ref 3.5–5.0)
Alkaline Phosphatase: 68 U/L (ref 38–126)
Anion gap: 13 (ref 5–15)
BUN: 8 mg/dL (ref 6–20)
CO2: 26 mmol/L (ref 22–32)
Calcium: 9.5 mg/dL (ref 8.9–10.3)
Chloride: 104 mmol/L (ref 101–111)
Creatinine, Ser: 0.89 mg/dL (ref 0.44–1.00)
GFR calc Af Amer: >60 mL/min (ref >60)
GFR calc non Af Amer: >60 mL/min (ref >60)
Glucose, Bld: 127 mg/dL — ABNORMAL HIGH (ref 65–99)
Potassium: 3.2 mmol/L — ABNORMAL LOW (ref 3.5–5.1)
Sodium: 143 mmol/L (ref 135–145)
Total Bilirubin: 0.7 mg/dL (ref 0.3–1.2)
Total Protein: 7.4 g/dL (ref 6.5–8.1)

## 2015-09-29 LAB — URINE MICROSCOPIC-ADD ON

## 2015-09-29 LAB — LIPASE, BLOOD: Lipase: 18 U/L (ref 11–51)

## 2015-09-29 LAB — POCT URINALYSIS DIP (DEVICE)
Glucose, UA: NEGATIVE mg/dL
Hgb urine dipstick: NEGATIVE
Ketones, ur: NEGATIVE mg/dL
Leukocytes, UA: NEGATIVE
Nitrite: NEGATIVE
Protein, ur: NEGATIVE mg/dL
Specific Gravity, Urine: >=1.03 (ref 1.005–1.030)
Urobilinogen, UA: 0.2 mg/dL (ref 0.0–1.0)
pH: 5.5 (ref 5.0–8.0)

## 2015-09-29 LAB — POC URINE PREG, ED: Preg Test, Ur: NEGATIVE

## 2015-09-29 MED ORDER — ONDANSETRON 4 MG PO TBDP
8.0000 mg | ORAL_TABLET | Freq: Once | ORAL | Status: AC
  Administered 2015-09-29: 8 mg via ORAL

## 2015-09-29 MED ORDER — ONDANSETRON HCL 4 MG/2ML IJ SOLN
4.0000 mg | Freq: Once | INTRAMUSCULAR | Status: AC
  Administered 2015-09-29: 4 mg via INTRAVENOUS
  Filled 2015-09-29: qty 2

## 2015-09-29 MED ORDER — HYDROMORPHONE HCL 1 MG/ML IJ SOLN
INTRAMUSCULAR | Status: AC
  Filled 2015-09-29: qty 2

## 2015-09-29 MED ORDER — HYDROMORPHONE HCL 1 MG/ML IJ SOLN
2.0000 mg | Freq: Once | INTRAMUSCULAR | Status: AC
  Administered 2015-09-29: 2 mg via INTRAMUSCULAR

## 2015-09-29 MED ORDER — ONDANSETRON 4 MG PO TBDP
ORAL_TABLET | ORAL | Status: AC
  Filled 2015-09-29: qty 1

## 2015-09-29 MED ORDER — MORPHINE SULFATE (PF) 4 MG/ML IV SOLN
4.0000 mg | Freq: Once | INTRAVENOUS | Status: AC
  Administered 2015-09-29: 4 mg via INTRAVENOUS
  Filled 2015-09-29: qty 1

## 2015-09-29 NOTE — ED Notes (Signed)
Patient refused wheelchair to get to waiting room from triage

## 2015-09-29 NOTE — ED Notes (Addendum)
The patient said she started having abdominal pain yesterday but she kept getting worse.  The patient went to UC and they gave her Morphine IM and sent her via shuttle to R/O appendicitis and kidney stone.  She rates her pain 9/10.    They checked her urine and she does have blood in her urine.

## 2015-09-29 NOTE — ED Notes (Signed)
Urgent Care called, report they gave the patient 2mg  of Dilaudid and 8 mg of zofran.

## 2015-09-29 NOTE — ED Notes (Signed)
Pt  States  Dilaudid  Makes  Her  Nauseated       Dr  Juventino Slovak notified      Will  Give  zofran  With  It

## 2015-09-29 NOTE — ED Notes (Signed)
MD at bedside. 

## 2015-09-29 NOTE — ED Notes (Signed)
Pt   Advised  To  Remain  Npo

## 2015-09-29 NOTE — ED Notes (Signed)
Pt  Has  No  Nausea          No  Vomiting  Pain  Somewhat  Better

## 2015-09-29 NOTE — ED Notes (Signed)
Pt  Reports  r  Sided   abd / flank  Pain  With   Symptoms  That  Began  Yesterday           Some  Nausea present        Symptoms    Not  releived  By otc    meds              appears    To   Be  In pain

## 2015-09-29 NOTE — ED Provider Notes (Signed)
CSN: [id number]     Arrival date & time 09/29/15  1716 History   First MD Initiated Contact with Patient 09/29/15 1907     Chief Complaint  Patient presents with  . Flank Pain   (Consider location/radiation/quality/duration/timing/severity/associated sxs/prior Treatment) Patient is a 50 y.o. female presenting with flank pain. The history is provided by the patient.  Flank Pain This is a new problem. The current episode started yesterday. The problem has been gradually worsening. Associated symptoms include abdominal pain. Pertinent negatives include no chest pain and no shortness of breath. Associated symptoms comments: Nl bm this am without change in sx..    Past Medical History  Diagnosis Date  . Hypertension   . Diverticulitis   . GERD (gastroesophageal reflux disease) 07/07/2005  . Hemorrhoid   . Hepatic steatosis   . Positive H. pylori test   . Internal hemorrhoids   . Anal fissure    Past Surgical History  Procedure Laterality Date  . Colon resection  2000's Dr. Hassell Done    Diverticulitis.  . G3p2 c-section1    . Tubal ligation    . Tonsillectomy     Family History  Problem Relation Age of Onset  . Hypertension Mother   . Hypertension Father   . Heart disease Father   . Diabetes Neg Hx   . COPD Neg Hx    Social History  Substance Use Topics  . Smoking status: Never Smoker   . Smokeless tobacco: Never Used  . Alcohol Use: No   OB History    No data available     Review of Systems  Constitutional: Positive for appetite change.  Respiratory: Negative.  Negative for shortness of breath.   Cardiovascular: Negative for chest pain.  Gastrointestinal: Positive for nausea and abdominal pain. Negative for blood in stool.  Genitourinary: Positive for flank pain. Negative for dysuria, urgency, frequency, hematuria and menstrual problem.  All other systems reviewed and are negative.   Allergies  Dilaudid; Ibuprofen; and Lisinopril  Home Medications   Prior to  Admission medications   Medication Sig Start Date End Date Taking? Authorizing Provider  Cholecalciferol (VITAMIN D3) 5000 UNITS TABS Take 1 tablet by mouth daily.    Historical Provider, MD  cyclobenzaprine (FLEXERIL) 10 MG tablet Take 10 mg by mouth 3 (three) times daily as needed for muscle spasms.    Historical Provider, MD  diclofenac (VOLTAREN) 75 MG EC tablet TAKE 1 TABLET BY MOUTH TWICE DAILY 08/12/14   Tamala Fothergill Regal, DPM  diltiazem 2 % GEL Apply 1 application topically 3 (three) times daily. Patient taking differently: Place 1 application rectally 3 (three) times daily.  04/05/15   Jerene Bears, MD  hydrochlorothiazide (HYDRODIURIL) 25 MG tablet Take 1 tablet (25 mg total) by mouth daily. 06/16/14   Hoyt Koch, MD  hydrocortisone (ANUSOL-HC) 25 MG suppository Place 1 suppository (25 mg total) rectally 2 (two) times daily. X 10 days Patient not taking: Reported on 06/29/2015 09/22/14   Cecille Rubin P Hvozdovic, PA-C  Lidocaine, Anorectal, (RECTICARE) 5 % CREA Coat Anusol suppository prior to insertion Patient not taking: Reported on 06/29/2015 09/22/14   Lori P Hvozdovic, PA-C  Multiple Vitamin (MULTIVITAMIN WITH MINERALS) TABS tablet Take 1 tablet by mouth daily.    Historical Provider, MD  ondansetron (ZOFRAN ODT) 8 MG disintegrating tablet Take 1 tablet (8 mg total) by mouth every 8 (eight) hours as needed for nausea. Patient not taking: Reported on 06/29/2015 07/28/14   Harden Mo, MD  polyethylene glycol powder (GLYCOLAX/MIRALAX) powder 17 grams twice daily    Historical Provider, MD  TRAMADOL HCL PO Take 50 mg by mouth every 8 (eight) hours as needed (pain).     Historical Provider, MD   Meds Ordered and Administered this Visit   Medications  HYDROmorphone (DILAUDID) injection 2 mg (2 mg Intramuscular Given 09/29/15 1929)  ondansetron (ZOFRAN-ODT) disintegrating tablet 8 mg (8 mg Oral Given 09/29/15 1929)    BP 174/101 mmHg  Pulse 89  Temp(Src) 98 F (36.7 C) (Oral)  Resp 18   SpO2 99%  LMP 09/13/2015 No data found.   Physical Exam  Constitutional: She is oriented to person, place, and time. She appears well-developed and well-nourished. She appears distressed.  HENT:  Mouth/Throat: Oropharynx is clear and moist.  Neck: Normal range of motion. Neck supple.  Abdominal: Soft. Normal appearance. She exhibits no distension and no mass. Bowel sounds are decreased. There is tenderness in the right lower quadrant. There is tenderness at McBurney's point. There is no rigidity, no rebound, no guarding, no CVA tenderness and negative Murphy's sign.    Lymphadenopathy:    She has no cervical adenopathy.  Neurological: She is alert and oriented to person, place, and time.  Skin: Skin is warm and dry.  Nursing note and vitals reviewed.   ED Course  Procedures (including critical care time)  Labs Review Labs Reviewed  POCT URINALYSIS DIP (DEVICE) - Abnormal; Notable for the following:    Bilirubin Urine SMALL (*)    All other components within normal limits    Imaging Review No results found.   Visual Acuity Review  Right Eye Distance:   Left Eye Distance:   Bilateral Distance:    Right Eye Near:   Left Eye Near:    Bilateral Near:         MDM   1. Abdominal pain in female patient    Sent for eval of acute rlq abd pain, h/o divert with partial colectomy, u/a neg , nl menses, assoc with sl flank rad and nausea since last eve, getting worse.    Billy Fischer, MD 09/29/15 [phone]

## 2015-09-29 NOTE — ED Provider Notes (Signed)
CSN: [id number]     Arrival date & time 09/29/15  2009 History  By signing my name below, I, Evelene Croon, attest that this documentation has been prepared under the direction and in the presence of Delora Fuel, MD . Electronically Signed: Evelene Croon, Scribe. 09/29/2015. 11:22 PM.    Chief Complaint  Patient presents with  . Abdominal Pain    The patient said she started having abdominal pain yesterday but she kept getting worse.  The patient went to UC and they gave her Morphine IM and sent her via shuttle to R/O appendicitis and kidney stone.    The history is provided by the patient. No language interpreter was used.     HPI Comments:  [name] [name] is a 50 y.o. female who presents to the Emergency Department complaining of non-radiating dull right sided abdominal pain which began yesterday evening. Her pain at this time is a 7/10. She reports associated nausea. Pt has taken aleve gel caps without relief. She was evaluated at [facility] for same earlier today and was given morphine with relief but notes her pain is gradually worsening. She denies fever, chills, vomiting, and urinary symptoms.   Past Medical History  Diagnosis Date  . Hypertension   . Diverticulitis   . GERD (gastroesophageal reflux disease) 07/07/2005  . Hemorrhoid   . Hepatic steatosis   . Positive H. pylori test   . Internal hemorrhoids   . Anal fissure    Past Surgical History  Procedure Laterality Date  . Colon resection  2000's Dr. Hassell Done    Diverticulitis.  . G3p2 c-section1    . Tubal ligation    . Tonsillectomy     Family History  Problem Relation Age of Onset  . Hypertension Mother   . Hypertension Father   . Heart disease Father   . Diabetes Neg Hx   . COPD Neg Hx    Social History  Substance Use Topics  . Smoking status: Never Smoker   . Smokeless tobacco: Never Used  . Alcohol Use: No   OB History    No data available     Review of Systems  Constitutional: Negative for fever and  chills.  Gastrointestinal: Positive for nausea and abdominal pain. Negative for vomiting.  Genitourinary: Negative for dysuria, frequency, hematuria and difficulty urinating.  All other systems reviewed and are negative.   Allergies  Dilaudid; Ibuprofen; and Lisinopril  Home Medications   Prior to Admission medications   Medication Sig Start Date End Date Taking? Authorizing Provider  Cholecalciferol (VITAMIN D3) 5000 UNITS TABS Take 1 tablet by mouth daily.   Yes Historical Provider, MD  cyclobenzaprine (FLEXERIL) 10 MG tablet Take 10 mg by mouth 3 (three) times daily as needed for muscle spasms.   Yes Historical Provider, MD  diclofenac (VOLTAREN) 75 MG EC tablet Take 75 mg by mouth 2 (two) times daily.   Yes Historical Provider, MD  diltiazem 2 % GEL Apply 1 application topically 3 (three) times daily. Patient taking differently: Place 1 application rectally 3 (three) times daily.  04/05/15  Yes Jerene Bears, MD  hydrochlorothiazide (HYDRODIURIL) 25 MG tablet Take 1 tablet (25 mg total) by mouth daily. 06/16/14  Yes Hoyt Koch, MD  Multiple Vitamin (MULTIVITAMIN WITH MINERALS) TABS tablet Take 1 tablet by mouth daily.   Yes Historical Provider, MD  polyethylene glycol powder (GLYCOLAX/MIRALAX) powder 17 grams twice daily   Yes Historical Provider, MD   BP 109/83 mmHg  Pulse 99  Temp(Src) 98.4 F (36.9 C) (Oral)  Resp 16  Ht 5\' 5"  (1.651 m)  Wt 242 lb (109.77 kg)  BMI 40.27 kg/m2  SpO2 97%  LMP 09/13/2015 Physical Exam  Constitutional: She is oriented to person, place, and time. She appears well-developed and well-nourished. No distress.  HENT:  Head: Normocephalic and atraumatic.  Eyes: Conjunctivae and EOM are normal. Pupils are equal, round, and reactive to light.  Neck: Normal range of motion. Neck supple. No JVD present.  Cardiovascular: Normal rate, regular rhythm and normal heart sounds.   No murmur heard. Pulmonary/Chest: Effort normal and breath sounds  normal. She has no wheezes. She has no rales. She exhibits no tenderness.  Abdominal: Soft. She exhibits no distension and no mass. Bowel sounds are decreased. There is tenderness. There is no rebound, no guarding and no CVA tenderness.  Mild tenderness RLQ laterally   Musculoskeletal: Normal range of motion. She exhibits no edema.  Lymphadenopathy:    She has no cervical adenopathy.  Neurological: She is alert and oriented to person, place, and time. No cranial nerve deficit. She exhibits normal muscle tone. Coordination normal.  Skin: Skin is warm and dry. No rash noted.  Psychiatric: She has a normal mood and affect. Her behavior is normal. Judgment and thought content normal.  Nursing note and vitals reviewed.   ED Course  Procedures   DIAGNOSTIC STUDIES:  Oxygen Saturation is 99% on RA, normal by my interpretation.    COORDINATION OF CARE:  11:17 PM Will order CT A/P and lab work. Discussed treatment plan with pt at bedside and pt agreed to plan.  Labs Review Results for orders placed or performed during the hospital encounter of 09/29/15  CBC with Differential  Result Value Ref Range   WBC 6.5 4.0 - 10.5 K/uL   RBC 4.20 3.87 - 5.11 MIL/uL   Hemoglobin 12.6 12.0 - 15.0 g/dL   HCT 37.3 36.0 - 46.0 %   MCV 88.8 78.0 - 100.0 fL   MCH 30.0 26.0 - 34.0 pg   MCHC 33.8 30.0 - 36.0 g/dL   RDW 13.4 11.5 - 15.5 %   Platelets 126 (L) 150 - 400 K/uL   Neutrophils Relative % 67 %   Neutro Abs 4.4 1.7 - 7.7 K/uL   Lymphocytes Relative 25 %   Lymphs Abs 1.6 0.7 - 4.0 K/uL   Monocytes Relative 7 %   Monocytes Absolute 0.5 0.1 - 1.0 K/uL   Eosinophils Relative 1 %   Eosinophils Absolute 0.0 0.0 - 0.7 K/uL   Basophils Relative 0 %   Basophils Absolute 0.0 0.0 - 0.1 K/uL  Comprehensive metabolic panel  Result Value Ref Range   Sodium 143 135 - 145 mmol/L   Potassium 3.2 (L) 3.5 - 5.1 mmol/L   Chloride 104 101 - 111 mmol/L   CO2 26 22 - 32 mmol/L   Glucose, Bld 127 (H) 65 - 99  mg/dL   BUN 8 6 - 20 mg/dL   Creatinine, Ser 0.89 0.44 - 1.00 mg/dL   Calcium 9.5 8.9 - 10.3 mg/dL   Total Protein 7.4 6.5 - 8.1 g/dL   Albumin 3.9 3.5 - 5.0 g/dL   AST 32 15 - 41 U/L   ALT 30 14 - 54 U/L   Alkaline Phosphatase 68 38 - 126 U/L   Total Bilirubin 0.7 0.3 - 1.2 mg/dL   GFR calc non Af Amer >60 >60 mL/min   GFR calc Af Amer >60 >60 mL/min  Anion gap 13 5 - 15  Lipase, blood  Result Value Ref Range   Lipase 18 11 - 51 U/L  Urinalysis, Routine w reflex microscopic (not at [facility])  Result Value Ref Range   Color, Urine AMBER (A) YELLOW   APPearance TURBID (A) CLEAR   Specific Gravity, Urine 1.037 (H) 1.005 - 1.030   pH 5.0 5.0 - 8.0   Glucose, UA NEGATIVE NEGATIVE mg/dL   Hgb urine dipstick NEGATIVE NEGATIVE   Bilirubin Urine SMALL (A) NEGATIVE   Ketones, ur NEGATIVE NEGATIVE mg/dL   Protein, ur 30 (A) NEGATIVE mg/dL   Nitrite NEGATIVE NEGATIVE   Leukocytes, UA NEGATIVE NEGATIVE  Urine microscopic-add on  Result Value Ref Range   Squamous Epithelial / LPF 6-30 (A) NONE SEEN   WBC, UA 0-5 0 - 5 WBC/hpf   RBC / HPF 0-5 0 - 5 RBC/hpf   Bacteria, UA MANY (A) NONE SEEN   Casts HYALINE CASTS (A) NEGATIVE   Urine-Other MUCOUS PRESENT   POC urine preg, ED (not at [facility])  Result Value Ref Range   Preg Test, Ur NEGATIVE NEGATIVE   Imaging Review Ct Abdomen Pelvis W Contrast  09/30/2015  CLINICAL DATA:  50 year old female with right-sided abdominal pain and nausea. EXAM: CT ABDOMEN AND PELVIS WITH CONTRAST TECHNIQUE: Multidetector CT imaging of the abdomen and pelvis was performed using the standard protocol following bolus administration of intravenous contrast. CONTRAST:  159mL OMNIPAQUE IOHEXOL 300 MG/ML  SOLN COMPARISON:  Abdominal radiograph dated 07/12/2012 and CT dated 02/21/2012 FINDINGS: The visualized lung bases are clear. No intra-abdominal free air or free fluid. Diffuse hepatic steatosis. There is focal thickening of the gallbladder fundus compatible with  adenomyomatosis. No calcified gallstone. No pericholecystic fluid. The pancreas, spleen, and adrenal glands appear unremarkable. There is a 3 cm right renal cyst grossly stable or slightly increased compared to the prior study. There is no hydronephrosis on either side. The visualized ureters appear grossly unremarkable. The urinary bladder is predominantly collapsed. The uterus is anteverted and contains multiple fibroids. Ultrasound may provide better evaluation of the pelvic structures. There is no evidence of bowel obstruction or inflammation. Normal appendix. The abdominal aorta and IVC appear patent. No portal venous gas identified. There is no adenopathy. The abdominal wall soft tissues appear unremarkable. There is mild degenerative changes of the spine. The osseous structures are otherwise intact. IMPRESSION: No acute intra-abdominal pelvic pathology. Uterine fibroids. Fatty liver. Electronically Signed   By: Anner Crete M.D.   On: 09/30/2015 00:59   I have personally reviewed and evaluated these images and lab results as part of my medical decision-making.   EKG Interpretation None      MDM   Final diagnoses:  Right lower quadrant abdominal pain  Urinary tract infection without hematuria, site unspecified  Hypokalemia  Thrombocytopenia (HCC)    Abdominal pain which is almost suprapubic in location, although very far lateral. Old records are reviewed and she had been seen at urgent care and sent here. Initial laboratory workup is suggestive of urinary tract infection, although this does appear to be a mildly contaminated specimen. She is being sent for CT of abdomen and pelvis. If negative, will treat for urinary tract infection. Incidental finding of mild thrombus cytopenia and mild hypokalemia. She will be given potassium supplementation.  CT is unremarkable. She is given a dose of cephalexin and is discharged with prescriptions for cephalexin, oxycodone have acetaminophen, and  K-Dur. Follow-up with PCP in the next several days.  I  personally performed the services described in this documentation, which was scribed in my presence. The recorded information has been reviewed and is accurate.      Delora Fuel, MD [id number]

## 2015-09-30 ENCOUNTER — Encounter (HOSPITAL_COMMUNITY): Payer: Self-pay | Admitting: Radiology

## 2015-09-30 ENCOUNTER — Emergency Department (HOSPITAL_COMMUNITY): Payer: BLUE CROSS/BLUE SHIELD

## 2015-09-30 MED ORDER — OXYCODONE-ACETAMINOPHEN 5-325 MG PO TABS: 1.0000 | ORAL_TABLET | ORAL | Status: DC | PRN

## 2015-09-30 MED ORDER — IOHEXOL 300 MG/ML  SOLN
100.0000 mL | Freq: Once | INTRAMUSCULAR | Status: AC | PRN
  Administered 2015-09-30: 100 mL via INTRAVENOUS

## 2015-09-30 MED ORDER — CEPHALEXIN 250 MG PO CAPS
1000.0000 mg | ORAL_CAPSULE | Freq: Once | ORAL | Status: AC
  Administered 2015-09-30: 1000 mg via ORAL
  Filled 2015-09-30: qty 4

## 2015-09-30 MED ORDER — CEPHALEXIN 500 MG PO CAPS: 500.0000 mg | ORAL_CAPSULE | Freq: Four times a day (QID) | ORAL | Status: DC

## 2015-09-30 MED ORDER — POTASSIUM CHLORIDE CRYS ER 20 MEQ PO TBCR
40.0000 meq | EXTENDED_RELEASE_TABLET | Freq: Once | ORAL | Status: AC
  Administered 2015-09-30: 40 meq via ORAL
  Filled 2015-09-30: qty 2

## 2015-09-30 MED ORDER — POTASSIUM CHLORIDE CRYS ER 20 MEQ PO TBCR: 20.0000 meq | EXTENDED_RELEASE_TABLET | Freq: Every day | ORAL | Status: DC

## 2015-09-30 NOTE — ED Notes (Signed)
Discharge instructions reviewed - voiced understanding 

## 2015-09-30 NOTE — Discharge Instructions (Signed)
Urinary Tract Infection Urinary tract infections (UTIs) can develop anywhere along your urinary tract. Your urinary tract is your body's drainage system for removing wastes and extra water. Your urinary tract includes two kidneys, two ureters, a bladder, and a urethra. Your kidneys are a pair of bean-shaped organs. Each kidney is about the size of your fist. They are located below your ribs, one on each side of your spine. CAUSES Infections are caused by microbes, which are microscopic organisms, including fungi, viruses, and bacteria. These organisms are so small that they can only be seen through a microscope. Bacteria are the microbes that most commonly cause UTIs. SYMPTOMS  Symptoms of UTIs may vary by age and gender of the patient and by the location of the infection. Symptoms in young women typically include a frequent and intense urge to urinate and a painful, burning feeling in the bladder or urethra during urination. Older women and men are more likely to be tired, shaky, and weak and have muscle aches and abdominal pain. A fever may mean the infection is in your kidneys. Other symptoms of a kidney infection include pain in your back or sides below the ribs, nausea, and vomiting. DIAGNOSIS To diagnose a UTI, your caregiver will ask you about your symptoms. Your caregiver will also ask you to provide a urine sample. The urine sample will be tested for bacteria and white blood cells. White blood cells are made by your body to help fight infection. TREATMENT  Typically, UTIs can be treated with medication. Because most UTIs are caused by a bacterial infection, they usually can be treated with the use of antibiotics. The choice of antibiotic and length of treatment depend on your symptoms and the type of bacteria causing your infection. HOME CARE INSTRUCTIONS  If you were prescribed antibiotics, take them exactly as your caregiver instructs you. Finish the medication even if you feel better after  you have only taken some of the medication.  Drink enough water and fluids to keep your urine clear or pale yellow.  Avoid caffeine, tea, and carbonated beverages. They tend to irritate your bladder.  Empty your bladder often. Avoid holding urine for long periods of time.  Empty your bladder before and after sexual intercourse.  After a bowel movement, women should cleanse from front to back. Use each tissue only once. SEEK MEDICAL CARE IF:   You have back pain.  You develop a fever.  Your symptoms do not begin to resolve within 3 days. SEEK IMMEDIATE MEDICAL CARE IF:   You have severe back pain or lower abdominal pain.  You develop chills.  You have nausea or vomiting.  You have continued burning or discomfort with urination. MAKE SURE YOU:   Understand these instructions.  Will watch your condition.  Will get help right away if you are not doing well or get worse.   This information is not intended to replace advice given to you by your health care provider. Make sure you discuss any questions you have with your health care provider.   Document Released: 05/17/2005 Document Revised: 04/28/2015 Document Reviewed: 09/15/2011 Elsevier Interactive Patient Education 2016 [employer].  Hypokalemia Hypokalemia means that the amount of potassium in the blood is lower than normal.Potassium is a chemical, called an electrolyte, that helps regulate the amount of fluid in the body. It also stimulates muscle contraction and helps nerves function properly.Most of the body's potassium is inside of cells, and only a very small amount is in the blood. Because  the amount in the blood is so small, minor changes can be life-threatening. CAUSES  Antibiotics.  Diarrhea or vomiting.  Using laxatives too much, which can cause diarrhea.  Chronic kidney disease.  Water pills (diuretics).  Eating disorders (bulimia).  Low magnesium level.  Sweating a lot. SIGNS AND  SYMPTOMS  Weakness.  Constipation.  Fatigue.  Muscle cramps.  Mental confusion.  Skipped heartbeats or irregular heartbeat (palpitations).  Tingling or numbness. DIAGNOSIS  Your health care provider can diagnose hypokalemia with blood tests. In addition to checking your potassium level, your health care provider may also check other lab tests. TREATMENT Hypokalemia can be treated with potassium supplements taken by mouth or adjustments in your current medicines. If your potassium level is very low, you may need to get potassium through a vein (IV) and be monitored in the hospital. A diet high in potassium is also helpful. Foods high in potassium are:  Nuts, such as peanuts and pistachios.  Seeds, such as sunflower seeds and pumpkin seeds.  Peas, lentils, and lima beans.  Whole grain and bran cereals and breads.  Fresh fruit and vegetables, such as apricots, avocado, bananas, cantaloupe, kiwi, oranges, tomatoes, asparagus, and potatoes.  Orange and tomato juices.  Red meats.  Fruit yogurt. HOME CARE INSTRUCTIONS  Take all medicines as prescribed by your health care provider.  Maintain a healthy diet by including nutritious food, such as fruits, vegetables, nuts, whole grains, and lean meats.  If you are taking a laxative, be sure to follow the directions on the label. SEEK MEDICAL CARE IF:  Your weakness gets worse.  You feel your heart pounding or racing.  You are vomiting or having diarrhea.  You are diabetic and having trouble keeping your blood glucose in the normal range. SEEK IMMEDIATE MEDICAL CARE IF:  You have chest pain, shortness of breath, or dizziness.  You are vomiting or having diarrhea for more than 2 days.  You faint. MAKE SURE YOU:   Understand these instructions.  Will watch your condition.  Will get help right away if you are not doing well or get worse.   This information is not intended to replace advice given to you by your health  care provider. Make sure you discuss any questions you have with your health care provider.   Document Released: 08/07/2005 Document Revised: 08/28/2014 Document Reviewed: 02/07/2013 Elsevier Interactive Patient Education 2016 [employer].  Cephalexin tablets or capsules What is this medicine? CEPHALEXIN (sef a LEX in) is a cephalosporin antibiotic. It is used to treat certain kinds of bacterial infections It will not work for colds, flu, or other viral infections. This medicine may be used for other purposes; ask your health care provider or pharmacist if you have questions. What should I tell my health care provider before I take this medicine? They need to know if you have any of these conditions: -kidney disease -stomach or intestine problems, especially colitis -an unusual or allergic reaction to cephalexin, other cephalosporins, penicillins, other antibiotics, medicines, foods, dyes or preservatives -pregnant or trying to get pregnant -breast-feeding How should I use this medicine? Take this medicine by mouth with a full glass of water. Follow the directions on the prescription label. This medicine can be taken with or without food. Take your medicine at regular intervals. Do not take your medicine more often than directed. Take all of your medicine as directed even if you think you are better. Do not skip doses or stop your medicine early. Talk to your  pediatrician regarding the use of this medicine in children. While this drug may be prescribed for selected conditions, precautions do apply. Overdosage: If you think you have taken too much of this medicine contact a poison control center or emergency room at once. NOTE: This medicine is only for you. Do not share this medicine with others. What if I miss a dose? If you miss a dose, take it as soon as you can. If it is almost time for your next dose, take only that dose. Do not take double or extra doses. There should be at least 4 to 6  hours between doses. What may interact with this medicine? -probenecid -some other antibiotics This list may not describe all possible interactions. Give your health care provider a list of all the medicines, herbs, non-prescription drugs, or dietary supplements you use. Also tell them if you smoke, drink alcohol, or use illegal drugs. Some items may interact with your medicine. What should I watch for while using this medicine? Tell your doctor or health care professional if your symptoms do not begin to improve in a few days. Do not treat diarrhea with over the counter products. Contact your doctor if you have diarrhea that lasts more than 2 days or if it is severe and watery. If you have diabetes, you may get a false-positive result for sugar in your urine. Check with your doctor or health care professional. What side effects may I notice from receiving this medicine? Side effects that you should report to your doctor or health care professional as soon as possible: -allergic reactions like skin rash, itching or hives, swelling of the face, lips, or tongue -breathing problems -pain or trouble passing urine -redness, blistering, peeling or loosening of the skin, including inside the mouth -severe or watery diarrhea -unusually weak or tired -yellowing of the eyes, skin Side effects that usually do not require medical attention (report to your doctor or health care professional if they continue or are bothersome): -gas or heartburn -genital or anal irritation -headache -joint or muscle pain -nausea, vomiting This list may not describe all possible side effects. Call your doctor for medical advice about side effects. You may report side effects to FDA at 1-800-FDA-1088. Where should I keep my medicine? Keep out of the reach of children. Store at room temperature between 59 and 86 degrees F (15 and 30 degrees C). Throw away any unused medicine after the expiration date. NOTE: This sheet is  a summary. It may not cover all possible information. If you have questions about this medicine, talk to your doctor, pharmacist, or health care provider.    2016, Elsevier/Gold Standard. (2007-11-11 17:09:13)  Potassium Salts tablets, extended-release tablets or capsules What is this medicine? POTASSIUM (poe TASS i um) is a natural salt that is important for the heart, muscles, and nerves. It is found in many foods and is normally supplied by a well balanced diet. This medicine is used to treat low potassium. This medicine may be used for other purposes; ask your health care provider or pharmacist if you have questions. What should I tell my health care provider before I take this medicine? They need to know if you have any of these conditions: -Addison's disease -dehydration -diabetes -difficulty swallowing -heart disease -history of high levels of potassium in the blood -irregular heartbeat -kidney disease -recent severe burn -stomach ulcers or other stomach problems -an unusual or allergic reaction to potassium, tartrazine, other medicines, foods, dyes, or preservatives -pregnant or trying to  get pregnant -breast-feeding How should I use this medicine? Take this medicine by mouth with a full glass of water. Take with food. Follow the directions on the prescription label. Do not suck on, crush, or chew this medicine. If you have difficulty swallowing, ask the pharmacist how to take. Take your medicine at regular intervals. Do not take it more often than directed. Do not stop taking except on your doctor's advice. Talk to your pediatrician regarding the use of this medicine in children. Special care may be needed. Overdosage: If you think you have taken too much of this medicine contact a poison control center or emergency room at once. NOTE: This medicine is only for you. Do not share this medicine with others. What if I miss a dose? If you miss a dose, take it as soon as you can. If  it is almost time for your next dose, take only that dose. Do not take double or extra doses. What may interact with this medicine? Do not take this medicine with any of the following medications: -eplerenone -certain medicines for stomach problems like atropine; difenoxin and glycopyrrolate -sodium polystyrene sulfonate This medicine may also interact with the following medications: -certain medicines for blood pressure or heart disease like lisinopril, losartan, quinapril, valsartan -medicines for cold or allergies -NSAIDs, medicines for pain and inflammation, like ibuprofen or napoxen -other potassium supplements -salt substitutes -some diuretics This list may not describe all possible interactions. Give your health care provider a list of all the medicines, herbs, non-prescription drugs, or dietary supplements you use. Also tell them if you smoke, drink alcohol, or use illegal drugs. Some items may interact with your medicine. What should I watch for while using this medicine? Visit your doctor or health care professional for regular check ups. You will need lab work done regularly. You may need to be on a special diet while taking this medicine. Ask your doctor. What side effects may I notice from receiving this medicine? Side effects that you should report to your doctor or health care professional as soon as possible: -allergic reactions like skin rash, itching or hives, swelling of the face, lips, or tongue -anxious -black, tarry stools -breathing problems -confusion -heartburn -irregular heartbeat -numbness or tingling in hands or feet -pain when swallowing -unusually weak or tired -weakness, heaviness of legs Side effects that usually do not require medical attention (report to your doctor or health care professional if they continue or are bothersome): -diarrhea -nausea -upset stomach -vomiting This list may not describe all possible side effects. Call your doctor for  medical advice about side effects. You may report side effects to FDA at 1-800-FDA-1088. Where should I keep my medicine? Keep out of the reach of children. Store at room temperature between 15 and 30 degrees C (59 and 86 degrees F ). Keep bottle closed tightly to protect this medicine from light and moisture. Throw away any unused medicine after the expiration date. NOTE: This sheet is a summary. It may not cover all possible information. If you have questions about this medicine, talk to your doctor, pharmacist, or health care provider.    2016, Elsevier/Gold Standard. (2015-01-14 08:55:21)  Acetaminophen; Oxycodone tablets What is this medicine? ACETAMINOPHEN; OXYCODONE (a set a MEE noe fen; ox i KOE done) is a pain reliever. It is used to treat moderate to severe pain. This medicine may be used for other purposes; ask your health care provider or pharmacist if you have questions. What should I tell my health  care provider before I take this medicine? They need to know if you have any of these conditions: -brain tumor -Crohn's disease, inflammatory bowel disease, or ulcerative colitis -drug abuse or addiction -head injury -heart or circulation problems -if you often drink alcohol -kidney disease or problems going to the bathroom -liver disease -lung disease, asthma, or breathing problems -an unusual or allergic reaction to acetaminophen, oxycodone, other opioid analgesics, other medicines, foods, dyes, or preservatives -pregnant or trying to get pregnant -breast-feeding How should I use this medicine? Take this medicine by mouth with a full glass of water. Follow the directions on the prescription label. You can take it with or without food. If it upsets your stomach, take it with food. Take your medicine at regular intervals. Do not take it more often than directed. Talk to your pediatrician regarding the use of this medicine in children. Special care may be needed. Patients over 65  years old may have a stronger reaction and need a smaller dose. Overdosage: If you think you have taken too much of this medicine contact a poison control center or emergency room at once. NOTE: This medicine is only for you. Do not share this medicine with others. What if I miss a dose? If you miss a dose, take it as soon as you can. If it is almost time for your next dose, take only that dose. Do not take double or extra doses. What may interact with this medicine? -alcohol -antihistamines -barbiturates like amobarbital, butalbital, butabarbital, methohexital, pentobarbital, phenobarbital, thiopental, and secobarbital -benztropine -drugs for bladder problems like solifenacin, trospium, oxybutynin, tolterodine, hyoscyamine, and methscopolamine -drugs for breathing problems like ipratropium and tiotropium -drugs for certain stomach or intestine problems like propantheline, homatropine methylbromide, glycopyrrolate, atropine, belladonna, and dicyclomine -general anesthetics like etomidate, ketamine, nitrous oxide, propofol, desflurane, enflurane, halothane, isoflurane, and sevoflurane -medicines for depression, anxiety, or psychotic disturbances -medicines for sleep -muscle relaxants -naltrexone -narcotic medicines (opiates) for pain -phenothiazines like perphenazine, thioridazine, chlorpromazine, mesoridazine, fluphenazine, prochlorperazine, promazine, and trifluoperazine -scopolamine -tramadol -trihexyphenidyl This list may not describe all possible interactions. Give your health care provider a list of all the medicines, herbs, non-prescription drugs, or dietary supplements you use. Also tell them if you smoke, drink alcohol, or use illegal drugs. Some items may interact with your medicine. What should I watch for while using this medicine? Tell your doctor or health care professional if your pain does not go away, if it gets worse, or if you have new or a different type of pain. You may  develop tolerance to the medicine. Tolerance means that you will need a higher dose of the medication for pain relief. Tolerance is normal and is expected if you take this medicine for a long time. Do not suddenly stop taking your medicine because you may develop a severe reaction. Your body becomes used to the medicine. This does NOT mean you are addicted. Addiction is a behavior related to getting and using a drug for a non-medical reason. If you have pain, you have a medical reason to take pain medicine. Your doctor will tell you how much medicine to take. If your doctor wants you to stop the medicine, the dose will be slowly lowered over time to avoid any side effects. You may get drowsy or dizzy. Do not drive, use machinery, or do anything that needs mental alertness until you know how this medicine affects you. Do not stand or sit up quickly, especially if you are an older patient. This reduces the  risk of dizzy or fainting spells. Alcohol may interfere with the effect of this medicine. Avoid alcoholic drinks. There are different types of narcotic medicines (opiates) for pain. If you take more than one type at the same time, you may have more side effects. Give your health care provider a list of all medicines you use. Your doctor will tell you how much medicine to take. Do not take more medicine than directed. Call emergency for help if you have problems breathing. The medicine will cause constipation. Try to have a bowel movement at least every 2 to 3 days. If you do not have a bowel movement for 3 days, call your doctor or health care professional. Do not take Tylenol (acetaminophen) or medicines that have acetaminophen with this medicine. Too much acetaminophen can be very dangerous. Many nonprescription medicines contain acetaminophen. Always read the labels carefully to avoid taking more acetaminophen. What side effects may I notice from receiving this medicine? Side effects that you should report  to your doctor or health care professional as soon as possible: -allergic reactions like skin rash, itching or hives, swelling of the face, lips, or tongue -breathing difficulties, wheezing -confusion -light headedness or fainting spells -severe stomach pain -unusually weak or tired -yellowing of the skin or the whites of the eyes Side effects that usually do not require medical attention (report to your doctor or health care professional if they continue or are bothersome): -dizziness -drowsiness -nausea -vomiting This list may not describe all possible side effects. Call your doctor for medical advice about side effects. You may report side effects to FDA at 1-800-FDA-1088. Where should I keep my medicine? Keep out of the reach of children. This medicine can be abused. Keep your medicine in a safe place to protect it from theft. Do not share this medicine with anyone. Selling or giving away this medicine is dangerous and against the law. This medicine may cause accidental overdose and death if it taken by other adults, children, or pets. Mix any unused medicine with a substance like cat litter or coffee grounds. Then throw the medicine away in a sealed container like a sealed bag or a coffee can with a lid. Do not use the medicine after the expiration date. Store at room temperature between 20 and 25 degrees C (68 and 77 degrees F). NOTE: This sheet is a summary. It may not cover all possible information. If you have questions about this medicine, talk to your doctor, pharmacist, or health care provider.    2016, Elsevier/Gold Standard. (2014-07-08 15:18:46)

## 2016-03-10 ENCOUNTER — Other Ambulatory Visit: Payer: Self-pay | Admitting: Obstetrics and Gynecology

## 2016-03-10 ENCOUNTER — Other Ambulatory Visit (HOSPITAL_COMMUNITY)
Admission: RE | Admit: 2016-03-10 | Discharge: 2016-03-10 | Disposition: A | Discharge status: Home / Self Care | Payer: BLUE CROSS/BLUE SHIELD | Source: Ambulatory Visit | Attending: Obstetrics and Gynecology | Admitting: Obstetrics and Gynecology

## 2016-03-10 DIAGNOSIS — Z01419 Encounter for gynecological examination (general) (routine) without abnormal findings: Secondary | ICD-10-CM | POA: Insufficient documentation

## 2016-03-13 LAB — CYTOLOGY - PAP

## 2016-07-17 ENCOUNTER — Other Ambulatory Visit: Payer: Self-pay | Admitting: Obstetrics and Gynecology

## 2016-07-17 DIAGNOSIS — Z1231 Encounter for screening mammogram for malignant neoplasm of breast: Secondary | ICD-10-CM

## 2016-08-03 ENCOUNTER — Ambulatory Visit (INDEPENDENT_AMBULATORY_CARE_PROVIDER_SITE_OTHER): Payer: BLUE CROSS/BLUE SHIELD

## 2016-08-03 ENCOUNTER — Encounter: Payer: Self-pay | Admitting: Podiatry

## 2016-08-03 ENCOUNTER — Ambulatory Visit (INDEPENDENT_AMBULATORY_CARE_PROVIDER_SITE_OTHER): Payer: BLUE CROSS/BLUE SHIELD | Admitting: Podiatry

## 2016-08-03 DIAGNOSIS — M722 Plantar fascial fibromatosis: Secondary | ICD-10-CM | POA: Diagnosis not present

## 2016-08-03 MED ORDER — TRIAMCINOLONE ACETONIDE 10 MG/ML IJ SUSP
10.0000 mg | Freq: Once | INTRAMUSCULAR | Status: AC
  Administered 2016-08-03: 10 mg

## 2016-08-03 NOTE — Progress Notes (Signed)
Subjective:     Patient ID: [name] [name], female   DOB: 12-Jun-1966, 50 y.o.   MRN: [id number]  HPI patient presents stating she went to a family doctor who gave her several injections in her left heel and they are not helping and the second one seemed to make it worse and it seems to be in a different spot and she feels like she cannot bear satisfactory weight   Review of Systems     Objective:   Physical Exam Neurovascular status intact muscle strength adequate with pain in the lateral side of the plantar fascial left with the medial band being relatively pain-free. Patient has difficulty bearing weight on this particular area    Assessment:     Inflammatory fasciitis left lateral side    Plan:     H&P x-rays reviewed and careful lateral injection administered 3 mg Kenalog 5 mg Xylocaine and dispensed air fracture walker to completely reduce the weightbearing forces on the left heel. Reappoint 2 weeks to reevaluate  X-rays indicate spur but no indication of stress fracture or advanced arthritis at the current time

## 2016-08-09 ENCOUNTER — Ambulatory Visit
Admission: RE | Admit: 2016-08-09 | Discharge: 2016-08-09 | Disposition: A | Discharge status: Home / Self Care | Payer: BLUE CROSS/BLUE SHIELD | Source: Ambulatory Visit | Attending: Obstetrics and Gynecology | Admitting: Obstetrics and Gynecology

## 2016-08-09 DIAGNOSIS — Z1231 Encounter for screening mammogram for malignant neoplasm of breast: Secondary | ICD-10-CM

## 2016-08-18 ENCOUNTER — Ambulatory Visit: Payer: BLUE CROSS/BLUE SHIELD | Admitting: Podiatry

## 2016-08-25 ENCOUNTER — Ambulatory Visit: Payer: BLUE CROSS/BLUE SHIELD | Admitting: Podiatry

## 2016-09-14 ENCOUNTER — Other Ambulatory Visit: Payer: Self-pay | Admitting: Obstetrics and Gynecology

## 2016-12-20 ENCOUNTER — Ambulatory Visit (INDEPENDENT_AMBULATORY_CARE_PROVIDER_SITE_OTHER): Payer: BLUE CROSS/BLUE SHIELD | Admitting: Podiatry

## 2016-12-20 DIAGNOSIS — M722 Plantar fascial fibromatosis: Secondary | ICD-10-CM

## 2016-12-20 NOTE — Patient Instructions (Signed)
Pre-Operative Instructions  Congratulations, you have decided to take an important step to improving your quality of life.  You can be assured that the doctors of [facility] will be with you every step of the way.  1. Plan to be at the surgery center/hospital at least 1 (one) hour prior to your scheduled time unless otherwise directed by the surgical center/hospital staff.  You must have a responsible adult accompany you, remain during the surgery and drive you home.  Make sure you have directions to the surgical center/hospital and know how to get there on time. 2. For hospital based surgery you will need to obtain a history and physical form from your family physician within 1 month prior to the date of surgery- we will give you a form for you primary physician.  3. We make every effort to accommodate the date you request for surgery.  There are however, times where surgery dates or times have to be moved.  We will contact you as soon as possible if a change in schedule is required.   4. No Aspirin/Ibuprofen for one week before surgery.  If you are on aspirin, any non-steroidal anti-inflammatory medications (Mobic, Aleve, Ibuprofen) you should stop taking it 7 days prior to your surgery.  You make take Tylenol  For pain prior to surgery.  5. Medications- If you are taking daily heart and blood pressure medications, seizure, reflux, allergy, asthma, anxiety, pain or diabetes medications, make sure the surgery center/hospital is aware before the day of surgery so they may notify you which medications to take or avoid the day of surgery. 6. No food or drink after midnight the night before surgery unless directed otherwise by surgical center/hospital staff. 7. No alcoholic beverages 24 hours prior to surgery.  No smoking 24 hours prior to or 24 hours after surgery. 8. Wear loose pants or shorts- loose enough to fit over bandages, boots, and casts. 9. No slip on shoes, sneakers are best. 10. Bring  your boot with you to the surgery center/hospital.  Also bring crutches or a walker if your physician has prescribed it for you.  If you do not have this equipment, it will be provided for you after surgery. 11. If you have not been contracted by the surgery center/hospital by the day before your surgery, call to confirm the date and time of your surgery. 12. Leave-time from work may vary depending on the type of surgery you have.  Appropriate arrangements should be made prior to surgery with your employer. 13. Prescriptions will be provided immediately following surgery by your doctor.  Have these filled as soon as possible after surgery and take the medication as directed. 14. Remove nail polish on the operative foot. 15. Wash the night before surgery.  The night before surgery wash the foot and leg well with the antibacterial soap provided and water paying special attention to beneath the toenails and in between the toes.  Rinse thoroughly with water and dry well with a towel.  Perform this wash unless told not to do so by your physician.  Enclosed: 1 Ice pack (please put in freezer the night before surgery)   1 Hibiclens skin cleaner   Pre-op Instructions  If you have any questions regarding the instructions, do not hesitate to call our office.  [place]: [address] , [place] [address] [phone]  [place]: [address] [place], [place] [address] [phone]  [place]: [address]  [place], [place] [address] [phone]   Dr.   Norman Regal DPM, Dr. Matthew Wagoner DPM, Dr. M. Todd Hyatt DPM, Dr. Titorya Stover DPM 

## 2016-12-21 NOTE — Progress Notes (Signed)
Subjective:    Patient ID: [name] [name], female   DOB: 51 y.o.   MRN: [id number]   HPI patient presents stating the left heel is killing me and making it hard for me to be active or walk and it's not responded everything we've done    ROS      Objective:  Physical Exam Neurovascular status intact muscle strength adequate with severe discomfort plantar fascial left at the insertional point tendon calcaneus with inflammation fluid buildup around the medial band and failure to respond to cast immobilization inserts ice therapy stretching and shoe gear modifications along with cortisone injections    Assessment:     Severe plantar fasciitis left that's not respond to numerous conservative treatments    Plan:     Condition reviewed and we discussed at great length. Due to her inability to be active and the pain she is experiencing she wants something more definitive and I did discuss endoscopic surgery and she wants this done. I allowed her to read consent form reviewing alternative treatments complications the fact there is no long-term guarantees. Patient wants surgery signed consent form after extensive review and is scheduled for outpatient surgery. Patient signed consent form understands that this may cause her to get discomfort in different areas and that it may take quite a bit of time for that to alleviate. I also explained that recovery is 6 months to one year

## 2016-12-26 ENCOUNTER — Encounter: Payer: Self-pay | Admitting: Podiatry

## 2016-12-26 DIAGNOSIS — M722 Plantar fascial fibromatosis: Secondary | ICD-10-CM | POA: Diagnosis not present

## 2017-01-04 ENCOUNTER — Ambulatory Visit (INDEPENDENT_AMBULATORY_CARE_PROVIDER_SITE_OTHER): Payer: Self-pay | Admitting: Podiatry

## 2017-01-04 ENCOUNTER — Encounter: Payer: Self-pay | Admitting: Podiatry

## 2017-01-04 DIAGNOSIS — Z9889 Other specified postprocedural states: Secondary | ICD-10-CM

## 2017-01-04 DIAGNOSIS — M722 Plantar fascial fibromatosis: Secondary | ICD-10-CM

## 2017-01-05 NOTE — Progress Notes (Signed)
Subjective: [name] [name] is a 51 y.o. is seen today in office s/p left EPF preformed on 12/26/2016 with Dr. Paulla Dolly. This is her first POV. They state their pain is improving and she is not taking pain medication.. Denies any systemic complaints such as fevers, chills, nausea, vomiting. No calf pain, chest pain, shortness of breath.   Objective: General: No acute distress, AAOx3  DP/PT pulses palpable 2/4, CRT < 3 sec to all digits.  Protective sensation intact. Motor function intact.  Left foot: Incision is well coapted without any evidence of dehiscence. There is no surrounding erythema, ascending cellulitis, fluctuance, crepitus, malodor, drainage/purulence. There is minimal edema around the surgical site. There is minimal pain along the surgical site.  No other areas of tenderness to bilateral lower extremities.  No other open lesions or pre-ulcerative lesions.  No pain with calf compression, swelling, warmth, erythema.   Assessment and Plan:  Status post left EPF, doing well with no complications   -Treatment options discussed including all alternatives, risks, and complications -Antibiotic ointment and bandage applied. Keep clean, dry, intact.  -Continue CAM boot.  -Ice/elevation -Pain medication as needed. -Monitor for any clinical signs or symptoms of infection and DVT/PE and directed to call the office immediately should any occur or go to the ER. -Follow-up in 1 week for possible suture removal or sooner if any problems arise. In the meantime, encouraged to call the office with any questions, concerns, change in symptoms.   Celesta Gentile, DPM

## 2017-01-11 ENCOUNTER — Encounter: Payer: Self-pay | Admitting: Podiatry

## 2017-01-11 ENCOUNTER — Ambulatory Visit (INDEPENDENT_AMBULATORY_CARE_PROVIDER_SITE_OTHER): Payer: BLUE CROSS/BLUE SHIELD | Admitting: Podiatry

## 2017-01-11 DIAGNOSIS — M722 Plantar fascial fibromatosis: Secondary | ICD-10-CM

## 2017-01-13 NOTE — Progress Notes (Signed)
Subjective:    Patient ID: [name] [name], female   DOB: 51 y.o.   MRN: [id number]   HPI patient states she's doing very well with minimal discomfort    ROS      Objective:  Physical Exam Neurovascular status intact with patient found to have minimal discomfort plantar fascial left after having endoscopic release of the tendon    Assessment:   Doing much better from plantar fascial surgery left      Plan:    Advised on continued immobilization and reapplied compression dressing and instructed on gradual stretching and increased activity

## 2017-01-24 ENCOUNTER — Other Ambulatory Visit: Payer: Self-pay | Admitting: Obstetrics and Gynecology

## 2017-01-24 DIAGNOSIS — N644 Mastodynia: Secondary | ICD-10-CM

## 2017-01-29 ENCOUNTER — Ambulatory Visit
Admission: RE | Admit: 2017-01-29 | Discharge: 2017-01-29 | Disposition: A | Discharge status: Home / Self Care | Payer: BLUE CROSS/BLUE SHIELD | Source: Ambulatory Visit | Attending: Obstetrics and Gynecology | Admitting: Obstetrics and Gynecology

## 2017-01-29 DIAGNOSIS — N644 Mastodynia: Secondary | ICD-10-CM

## 2017-02-23 ENCOUNTER — Telehealth: Payer: Self-pay | Admitting: Podiatry

## 2017-02-23 ENCOUNTER — Encounter: Payer: Self-pay | Admitting: Podiatry

## 2017-02-23 NOTE — Telephone Encounter (Signed)
Can you please write a note for her to work half days or whatever she can, but this will only last for 2-3 more weeks and if she is continuing to have problems she will need to make an appt

## 2017-02-23 NOTE — Telephone Encounter (Signed)
I called pt back to let her know I could write a note for another 2-3 weeks then if she continues to have any problems after that she would need to schedule an appointment to be seen. Pt asked if she could pick up the note Monday and stated she understood we could not do a note for longer than 3 weeks.

## 2017-02-23 NOTE — Telephone Encounter (Signed)
I was calling to see if I could get another work note from where I had surgery. I'm still unable to work a fully day at my job. If you have any questions, you can reach me at [phone].

## 2017-03-09 ENCOUNTER — Telehealth: Payer: Self-pay | Admitting: *Deleted

## 2017-03-09 NOTE — Telephone Encounter (Signed)
Pt states she had a endoscopic plantar fasciotomy and was continuing to have pain, and she is back at work. I told pt she could have pain and swelling to varying degrees for 6-9 months post-op, to rest as much as possible, ice the area 3-4 times daily, for long periods of walking may want to wear surgical boot and if able to tolerate ibuprofen take as OTC package directs, and I would get her placed on the cancellation schedule for an earlier appt.

## 2017-03-09 NOTE — Telephone Encounter (Signed)
Pt has been added to our cancellation list for [name] to contact her if we get an earlier appointment.

## 2017-03-21 ENCOUNTER — Ambulatory Visit (INDEPENDENT_AMBULATORY_CARE_PROVIDER_SITE_OTHER): Payer: BLUE CROSS/BLUE SHIELD

## 2017-03-21 ENCOUNTER — Ambulatory Visit (INDEPENDENT_AMBULATORY_CARE_PROVIDER_SITE_OTHER): Payer: BLUE CROSS/BLUE SHIELD | Admitting: Podiatry

## 2017-03-21 ENCOUNTER — Encounter: Payer: Self-pay | Admitting: Podiatry

## 2017-03-21 DIAGNOSIS — M722 Plantar fascial fibromatosis: Secondary | ICD-10-CM | POA: Diagnosis not present

## 2017-03-21 MED ORDER — TRAMADOL HCL 50 MG PO TABS: 50.0000 mg | ORAL_TABLET | Freq: Three times a day (TID) | ORAL | 2 refills | Status: DC

## 2017-03-21 NOTE — Progress Notes (Signed)
Subjective:    Patient ID: [name] [name], female   DOB: 51 y.o.   MRN: [id number]   HPI patient states she's been back to work and that been by the middle of the day her foot is hurting her and the arch and it still is giving her some problems. States it's also still painful when she gets up in the morning    ROS      Objective:  Physical Exam neurovascular status intact negative Homans sign noted with approximate 3 month after having endoscopic procedure of the left plantar fascia. The heel itself is doing pretty well with mild to moderate discomfort but the arch is quite tender when pressed and there is slight diminishment the arch height     Assessment:    Cannot rule out that there may not be some change in the arch structure but I do think it's just an overuse syndrome and she's not been wearing her boot     Plan:    H&P condition reviewed and at this point I went ahead and I placed her on tramadol I wanted to go back into her boot and begin soaks. Patient will be seen back to recheck in 4 weeks after having immobilized we will see and I reviewed x-rays with her today  X-rays indicate the arch height is remaining good with no indications of pathology

## 2017-04-12 ENCOUNTER — Other Ambulatory Visit: Payer: Self-pay | Admitting: Family Medicine

## 2017-04-13 ENCOUNTER — Ambulatory Visit
Admission: RE | Admit: 2017-04-13 | Discharge: 2017-04-13 | Disposition: A | Discharge status: Home / Self Care | Payer: BLUE CROSS/BLUE SHIELD | Source: Ambulatory Visit | Attending: Family Medicine | Admitting: Family Medicine

## 2017-04-13 ENCOUNTER — Other Ambulatory Visit: Payer: Self-pay | Admitting: Family Medicine

## 2017-04-13 DIAGNOSIS — M79671 Pain in right foot: Secondary | ICD-10-CM

## 2017-04-18 ENCOUNTER — Encounter: Payer: Self-pay | Admitting: Podiatry

## 2017-04-18 ENCOUNTER — Ambulatory Visit: Payer: BLUE CROSS/BLUE SHIELD

## 2017-04-18 ENCOUNTER — Ambulatory Visit (INDEPENDENT_AMBULATORY_CARE_PROVIDER_SITE_OTHER): Payer: BLUE CROSS/BLUE SHIELD

## 2017-04-18 ENCOUNTER — Ambulatory Visit (INDEPENDENT_AMBULATORY_CARE_PROVIDER_SITE_OTHER): Payer: BLUE CROSS/BLUE SHIELD | Admitting: Podiatry

## 2017-04-18 ENCOUNTER — Other Ambulatory Visit: Payer: Self-pay | Admitting: Podiatry

## 2017-04-18 DIAGNOSIS — M79671 Pain in right foot: Secondary | ICD-10-CM

## 2017-04-18 DIAGNOSIS — M722 Plantar fascial fibromatosis: Secondary | ICD-10-CM

## 2017-04-18 MED ORDER — TRIAMCINOLONE ACETONIDE 10 MG/ML IJ SUSP
10.0000 mg | Freq: Once | INTRAMUSCULAR | Status: AC
  Administered 2017-04-18: 10 mg

## 2017-04-18 NOTE — Progress Notes (Signed)
Subjective:    Patient ID: [name] [name], female   DOB: 51 y.o.   MRN: [id number]   HPI patient states she feels like her left foot is starting to get better but she has developed a lot of pain in the plantar aspect of the right heel with inflammation fluid over the last few weeks    ROS      Objective:  Physical Exam neurovascular status intact with continued moderate discomfort midfoot left and into the lateral foot that is improved but still present with discomfort of an exquisite nature plantar aspect right heel     Assessment:   Continued inflammatory condition left which seems to be gradually improving but still painful with exquisite plantar fascial symptomatology right     Plan:    H&P conditions reviewed and injected the plantar fascia right 3 mg Kenalog 5 mill grams Xylocaine and instructed on continued heat ice and tramadol treatment and use the brace on the right. Reappoint for [name] to recheck   X-rays indicate there is no indications of spurring of the significant nature beyond the heel and no stress fracture or advanced arthritis

## 2017-05-03 ENCOUNTER — Ambulatory Visit: Payer: BLUE CROSS/BLUE SHIELD | Admitting: Podiatry

## 2017-07-10 ENCOUNTER — Ambulatory Visit: Payer: BLUE CROSS/BLUE SHIELD | Admitting: Gastroenterology

## 2017-07-10 ENCOUNTER — Encounter: Payer: Self-pay | Admitting: Gastroenterology

## 2017-07-10 VITALS — BP 134/82 | HR 74 | Ht 65.0 in | Wt 256.0 lb

## 2017-07-10 DIAGNOSIS — Z1211 Encounter for screening for malignant neoplasm of colon: Secondary | ICD-10-CM

## 2017-07-10 DIAGNOSIS — R1011 Right upper quadrant pain: Secondary | ICD-10-CM

## 2017-07-10 DIAGNOSIS — Z13220 Encounter for screening for lipoid disorders: Secondary | ICD-10-CM | POA: Insufficient documentation

## 2017-07-10 MED ORDER — NA SULFATE-K SULFATE-MG SULF 17.5-3.13-1.6 GM/177ML PO SOLN: 1.0000 | ORAL | 0 refills | Status: DC

## 2017-07-10 NOTE — Progress Notes (Addendum)
07/10/2017 [name] [name] [id number] 02-Dec-1965   HISTORY OF PRESENT ILLNESS: This is a pleasant 51 year old female who presents her office today with complaints of right upper quadrant abdominal pain.  She is known to Dr. Hilarie Fredrickson for previous issues with internal hemorrhoids and anal fissures.  She tells me that for the past 2 months she has been experiencing right upper quadrant abdominal pain.  This usually occurs after eating and usually lasts about 30 minutes or so.  Has become more frequent as time goes on.  She denies any other associated symptoms including nausea, vomiting, issues with moving her bowels, dark bloody stools, fevers or chills, heartburn or reflux.  She tells me that she had blood work done within the past couple months by her PCP and everything was normal.  We will try to obtain those records.  She is also due for colonoscopy.  Her last colonoscopy was in 2008, which was normal.  She is s/p sigmoid resection for history of diverticulitis several years ago.   Past Medical History:  Diagnosis Date  . Anal fissure   . Diverticulitis   . GERD (gastroesophageal reflux disease) 07/07/2005  . Hemorrhoid   . Hepatic steatosis   . Hypertension   . Internal hemorrhoids   . Positive H. pylori test    Past Surgical History:  Procedure Laterality Date  . COLON RESECTION  2000's Dr. Hassell Done   Diverticulitis.  [facility] FOOT SURGERY Left   . g3p2 c-section1    . KNEE SURGERY Right   . TONSILLECTOMY    . TUBAL LIGATION      reports that  has never smoked. she has never used smokeless tobacco. She reports that she does not drink alcohol or use drugs. family history includes Heart disease in her father; Hypertension in her father and mother. Allergies  Allergen Reactions  . Dilaudid [Hydromorphone Hcl] Nausea Only  . Ibuprofen Swelling  . Lisinopril Swelling    REACTION: angioedema      Outpatient Encounter Medications as of 07/10/2017  Medication Sig  .  Cholecalciferol (VITAMIN D3) 5000 UNITS TABS Take 1 tablet by mouth daily.  . Multiple Vitamin (MULTIVITAMIN WITH MINERALS) TABS tablet Take 1 tablet by mouth daily.  Marland Kitchen olmesartan-hydrochlorothiazide (BENICAR HCT) 40-12.5 MG tablet Take 1 tablet by mouth daily.  . Na Sulfate-K Sulfate-Mg Sulf (SUPREP BOWEL PREP KIT) 17.5-3.13-1.6 GM/177ML SOLN Take 1 kit by mouth as directed.  . [DISCONTINUED] hydrochlorothiazide (HYDRODIURIL) 25 MG tablet Take 1 tablet (25 mg total) by mouth daily.  . [DISCONTINUED] traMADol (ULTRAM) 50 MG tablet Take 1 tablet (50 mg total) by mouth 3 (three) times daily.   No facility-administered encounter medications on file as of 07/10/2017.      REVIEW OF SYSTEMS  : All other systems reviewed and negative except where noted in the History of Present Illness.   PHYSICAL EXAM: BP 134/82   Pulse 74   Ht 5' 5" (1.651 m)   Wt 256 lb (116.1 kg)   BMI 42.60 kg/m  General: Well developed black female in no acute distress Head: Normocephalic and atraumatic Eyes:  Sclerae anicteric, conjunctiva pink. Ears: Normal auditory acuity Lungs: Clear throughout to auscultation; no increased WOB. Heart: Regular rate and rhythm; no M/R/G. Abdomen: Soft, non-distended.  BS present.  Mild RUQ TTP. Rectal:  Will be done at the time of colonoscopy. Musculoskeletal: Symmetrical with no gross deformities  Skin: No lesions on visible extremities Extremities: No edema  Neurological: Alert oriented x 4, grossly  non-focal Psychological:  Alert and cooperative. Normal mood and affect  ASSESSMENT AND PLAN: -RUQ abdominal pain:  Intermittent after eating.  No other symptoms.  Suspect gallbladder source, likely biliary dyskinesia.  Will schedule ultrasound and if negative then will proceed with HIDA scan.  Will obtain most recent labs from PCP. -Screening colonoscopy:  Will schedule with Dr. Hilarie Fredrickson.    *The risks, benefits, and alternatives to colonoscopy were discussed with the patient  and she consents to proceed.     CC:  Dr. Marianna Payment  Addendum: Reviewed and agree with initial management. Pyrtle, Lajuan Lines, MD

## 2017-07-10 NOTE — Patient Instructions (Signed)
You have been scheduled for a colonoscopy. Please follow written instructions given to you at your visit today.  Please pick up your prep supplies at the pharmacy within the next 1-3 days. If you use inhalers (even only as needed), please bring them with you on the day of your procedure. Your physician has requested that you go to www.startemmi.com and enter the access code given to you at your visit today. This web site gives a general overview about your procedure. However, you should still follow specific instructions given to you by our office regarding your preparation for the procedure.   You have been scheduled for an abdominal ultrasound at [facility] (1st floor of hospital) on 07/13/17 at 10:30 am. Please arrive 15 minutes prior to your appointment for registration. Make certain not to have anything to eat or drink 6 hours prior to your appointment. Should you need to reschedule your appointment, please contact radiology at [phone]. This test typically takes about 30 minutes to perform.  You have been scheduled for a HIDA scan at [facility] (1st floor) on 07/23/17. Please arrive 15 minutes prior to your scheduled appointment at  6:94 am. Make certain not to have anything to eat or drink at least 6 hours prior to your test. Should this appointment date or time not work well for you, please call radiology scheduling at [phone].  _____________________________________________________________________ hepatobiliary (HIDA) scan is an imaging procedure used to diagnose problems in the liver, gallbladder and bile ducts. In the HIDA scan, a radioactive chemical or tracer is injected into a vein in your arm. The tracer is handled by the liver like bile. Bile is a fluid produced and excreted by your liver that helps your digestive system break down fats in the foods you eat. Bile is stored in your gallbladder and the gallbladder releases the bile when you eat a meal. A special  nuclear medicine scanner (gamma camera) tracks the flow of the tracer from your liver into your gallbladder and small intestine.  During your HIDA scan  You'll be asked to change into a hospital gown before your HIDA scan begins. Your health care team will position you on a table, usually on your back. The radioactive tracer is then injected into a vein in your arm.The tracer travels through your bloodstream to your liver, where it's taken up by the bile-producing cells. The radioactive tracer travels with the bile from your liver into your gallbladder and through your bile ducts to your small intestine.You may feel some pressure while the radioactive tracer is injected into your vein. As you lie on the table, a special gamma camera is positioned over your abdomen taking pictures of the tracer as it moves through your body. The gamma camera takes pictures continually for about an hour. You'll need to keep still during the HIDA scan. This can become uncomfortable, but you may find that you can lessen the discomfort by taking deep breaths and thinking about other things. Tell your health care team if you're uncomfortable. The radiologist will watch on a computer the progress of the radioactive tracer through your body. The HIDA scan may be stopped when the radioactive tracer is seen in the gallbladder and enters your small intestine. This typically takes about an hour. In some cases extra imaging will be performed if original images aren't satisfactory, if morphine is given to help visualize the gallbladder or if the medication CCK is given to look at the contraction of the gallbladder. This test  typically takes 2 hours to complete. ________________________________________________________________________

## 2017-07-13 ENCOUNTER — Ambulatory Visit (HOSPITAL_COMMUNITY): Payer: BLUE CROSS/BLUE SHIELD

## 2017-07-16 ENCOUNTER — Ambulatory Visit (HOSPITAL_COMMUNITY): Payer: BLUE CROSS/BLUE SHIELD

## 2017-07-17 ENCOUNTER — Ambulatory Visit (HOSPITAL_COMMUNITY)
Admission: RE | Admit: 2017-07-17 | Discharge: 2017-07-17 | Disposition: A | Discharge status: Home / Self Care | Payer: BLUE CROSS/BLUE SHIELD | Source: Ambulatory Visit | Attending: Gastroenterology | Admitting: Gastroenterology

## 2017-07-17 DIAGNOSIS — K760 Fatty (change of) liver, not elsewhere classified: Secondary | ICD-10-CM | POA: Diagnosis not present

## 2017-07-17 DIAGNOSIS — R1011 Right upper quadrant pain: Secondary | ICD-10-CM | POA: Insufficient documentation

## 2017-07-23 ENCOUNTER — Ambulatory Visit (HOSPITAL_COMMUNITY)
Admission: RE | Admit: 2017-07-23 | Discharge: 2017-07-23 | Disposition: A | Discharge status: Home / Self Care | Payer: BLUE CROSS/BLUE SHIELD | Source: Ambulatory Visit | Attending: Gastroenterology | Admitting: Gastroenterology

## 2017-07-23 DIAGNOSIS — R1011 Right upper quadrant pain: Secondary | ICD-10-CM | POA: Diagnosis present

## 2017-07-23 MED ORDER — TECHNETIUM TC 99M MEBROFENIN IV KIT
5.1000 | PACK | Freq: Once | INTRAVENOUS | Status: AC | PRN
  Administered 2017-07-23: 5.1 via INTRAVENOUS

## 2017-08-08 ENCOUNTER — Encounter: Payer: BLUE CROSS/BLUE SHIELD | Admitting: Internal Medicine

## 2017-08-21 DIAGNOSIS — C50919 Malignant neoplasm of unspecified site of unspecified female breast: Secondary | ICD-10-CM

## 2017-08-21 HISTORY — DX: Malignant neoplasm of unspecified site of unspecified female breast: C50.919

## 2017-08-22 ENCOUNTER — Encounter: Payer: Self-pay | Admitting: Internal Medicine

## 2017-08-28 ENCOUNTER — Ambulatory Visit (AMBULATORY_SURGERY_CENTER): Payer: BLUE CROSS/BLUE SHIELD | Admitting: Internal Medicine

## 2017-08-28 ENCOUNTER — Other Ambulatory Visit: Payer: Self-pay

## 2017-08-28 ENCOUNTER — Encounter: Payer: Self-pay | Admitting: Internal Medicine

## 2017-08-28 VITALS — BP 142/73 | HR 86 | Temp 97.3°F | Resp 22 | Ht 65.0 in | Wt 256.0 lb

## 2017-08-28 DIAGNOSIS — Z1212 Encounter for screening for malignant neoplasm of rectum: Secondary | ICD-10-CM

## 2017-08-28 DIAGNOSIS — Z1211 Encounter for screening for malignant neoplasm of colon: Secondary | ICD-10-CM | POA: Diagnosis present

## 2017-08-28 MED ORDER — SODIUM CHLORIDE 0.9 % IV SOLN: 500.0000 mL | Freq: Once | INTRAVENOUS | Status: DC

## 2017-08-28 NOTE — Op Note (Signed)
[facility] Patient Name: [name] [name] Procedure Date: 08/28/2017 1:14 PM MRN: [id number] Endoscopist: Jerene Bears , MD Age: 52 Referring MD:  Date of Birth: 14-May-1966 Gender: Female Account #: [id number] Procedure:                Colonoscopy Indications:              Screening for colorectal malignant neoplasm Medicines:                Monitored Anesthesia Care Procedure:                Pre-Anesthesia Assessment:                           - Prior to the procedure, a History and Physical                            was performed, and patient medications and                            allergies were reviewed. The patient's tolerance of                            previous anesthesia was also reviewed. The risks                            and benefits of the procedure and the sedation                            options and risks were discussed with the patient.                            All questions were answered, and informed consent                            was obtained. Prior Anticoagulants: The patient has                            taken no previous anticoagulant or antiplatelet                            agents. ASA Grade Assessment: III - A patient with                            severe systemic disease. After reviewing the risks                            and benefits, the patient was deemed in                            satisfactory condition to undergo the procedure.                           After obtaining informed consent, the colonoscope  was passed under direct vision. Throughout the                            procedure, the patient's blood pressure, pulse, and                            oxygen saturations were monitored continuously. The                            Colonoscope was introduced through the anus and                            advanced to the the cecum, identified by                            appendiceal orifice and  ileocecal valve. The                            colonoscopy was performed without difficulty. The                            patient tolerated the procedure well. The quality                            of the bowel preparation was good. The ileocecal                            valve, appendiceal orifice, and rectum were                            photographed. Scope In: 1:37:35 PM Scope Out: 1:51:13 PM Scope Withdrawal Time: 0 hours 10 minutes 17 seconds  Total Procedure Duration: 0 hours 13 minutes 38 seconds  Findings:                 The digital rectal exam was normal.                           Scattered medium-mouthed diverticula were found in                            the sigmoid colon and descending colon.                           Internal hemorrhoids were found during                            retroflexion. The hemorrhoids were small.                           The exam was otherwise without abnormality. Complications:            No immediate complications. Estimated Blood Loss:     Estimated blood loss: none. Impression:               - Diverticulosis in the sigmoid colon and in the  descending colon.                           - Internal hemorrhoids.                           - The examination was otherwise normal.                           - No specimens collected. Recommendation:           - Patient has a contact number available for                            emergencies. The signs and symptoms of potential                            delayed complications were discussed with the                            patient. Return to normal activities tomorrow.                            Written discharge instructions were provided to the                            patient.                           - Resume previous diet.                           - Continue present medications.                           - Repeat colonoscopy in 10 years for screening                             purposes. Jerene Bears, MD 08/28/2017 1:53:42 PM This report has been signed electronically.

## 2017-08-28 NOTE — Patient Instructions (Signed)
**  Handouts given on diverticulosis and hemorrhoids**   YOU HAD AN ENDOSCOPIC PROCEDURE TODAY: Refer to the procedure report and other information in the discharge instructions given to you for any specific questions about what was found during the examination. If this information does not answer your questions, please call [place] office at [phone] to clarify.   YOU SHOULD EXPECT: Some feelings of bloating in the abdomen. Passage of more gas than usual. Walking can help get rid of the air that was put into your GI tract during the procedure and reduce the bloating. If you had a lower endoscopy (such as a colonoscopy or flexible sigmoidoscopy) you may notice spotting of blood in your stool or on the toilet paper. Some abdominal soreness may be present for a day or two, also.  DIET: Your first meal following the procedure should be a light meal and then it is ok to progress to your normal diet. A half-sandwich or bowl of soup is an example of a good first meal. Heavy or fried foods are harder to digest and may make you feel nauseous or bloated. Drink plenty of fluids but you should avoid alcoholic beverages for 24 hours. If you had a esophageal dilation, please see attached instructions for diet.    ACTIVITY: Your care partner should take you home directly after the procedure. You should plan to take it easy, moving slowly for the rest of the day. You can resume normal activity the day after the procedure however YOU SHOULD NOT DRIVE, use power tools, machinery or perform tasks that involve climbing or major physical exertion for 24 hours (because of the sedation medicines used during the test).   SYMPTOMS TO REPORT IMMEDIATELY: A gastroenterologist can be reached at any hour. Please call [phone]  for any of the following symptoms:  Following lower endoscopy (colonoscopy, flexible sigmoidoscopy) Excessive amounts of blood in the stool  Significant tenderness, worsening of abdominal pains   Swelling of the abdomen that is new, acute  Fever of 100 or higher    FOLLOW UP:  If any biopsies were taken you will be contacted by phone or by letter within the next 1-3 weeks. Call [phone]  if you have not heard about the biopsies in 3 weeks.  Please also call with any specific questions about appointments or follow up tests.  

## 2017-08-28 NOTE — Progress Notes (Signed)
Report given to PACU, vss 

## 2017-08-29 ENCOUNTER — Telehealth: Payer: Self-pay

## 2017-08-29 NOTE — Telephone Encounter (Signed)
  Follow up Call-  Call back number 08/28/2017  Post procedure Call Back phone  # [phone]  Permission to leave phone message Yes  Some recent data might be hidden     Patient questions:  Do you have a fever, pain , or abdominal swelling? No. Pain Score  0 *  Have you tolerated food without any problems? Yes.    Have you been able to return to your normal activities? Yes.    Do you have any questions about your discharge instructions: Diet   No. Medications  No. Follow up visit  No.  Do you have questions or concerns about your Care? No.  Actions: * If pain score is 4 or above: No action needed, pain <4.

## 2018-01-22 ENCOUNTER — Ambulatory Visit: Payer: BLUE CROSS/BLUE SHIELD | Admitting: Neurology

## 2018-03-06 ENCOUNTER — Other Ambulatory Visit: Payer: Self-pay | Admitting: Obstetrics and Gynecology

## 2018-03-06 DIAGNOSIS — Z1231 Encounter for screening mammogram for malignant neoplasm of breast: Secondary | ICD-10-CM

## 2018-03-27 ENCOUNTER — Ambulatory Visit: Payer: BLUE CROSS/BLUE SHIELD

## 2018-03-28 ENCOUNTER — Ambulatory Visit (HOSPITAL_COMMUNITY)
Admission: EM | Admit: 2018-03-28 | Discharge: 2018-03-28 | Disposition: A | Discharge status: Home / Self Care | Payer: BLUE CROSS/BLUE SHIELD | Attending: Family Medicine | Admitting: Family Medicine

## 2018-03-28 ENCOUNTER — Encounter (HOSPITAL_COMMUNITY): Payer: Self-pay | Admitting: Emergency Medicine

## 2018-03-28 DIAGNOSIS — M7122 Synovial cyst of popliteal space [Baker], left knee: Secondary | ICD-10-CM

## 2018-03-28 DIAGNOSIS — M25562 Pain in left knee: Secondary | ICD-10-CM

## 2018-03-28 MED ORDER — NAPROXEN 500 MG PO TABS: 500.0000 mg | ORAL_TABLET | Freq: Two times a day (BID) | ORAL | 0 refills | Status: DC

## 2018-03-28 MED ORDER — ACETAMINOPHEN 500 MG PO TABS: 500.0000 mg | ORAL_TABLET | Freq: Four times a day (QID) | ORAL | 0 refills | Status: DC | PRN

## 2018-03-28 NOTE — Discharge Instructions (Addendum)
Brace given Continue conservative management of rest, ice, compression, and elevation Take tylenol 500mg  as needed for pain relief  Follow up with orthopedist next week for reevaluation and management Return or go to the ER if you have any new or worsening symptoms (fever, chills, chest pain, abdominal pain, changes in bowel or bladder habits, pain radiating into lower legs, etc...)

## 2018-03-28 NOTE — ED Notes (Signed)
Pt given knee sleave, pt stated "I'll just put that on when I get home". Given brace for home use.

## 2018-03-28 NOTE — ED Provider Notes (Addendum)
[facility]   [id number] 03/28/18 Arrival Time: 4128  CC: LEFT KNEE PAIN  SUBJECTIVE: History from: patient. [name] [name] is a 52 y.o. female complains of worsening left knee pain x 1 week.  Denies a precipitating event or specific injury.  Localizes the pain to the posterior knee.  Describes the pain as constant and achy in character.  Has tried OTC medications like aleve without relief.  Symptoms are made worse with standing from a seated position.  Denies similar symptoms in the past.  Denies fever, chills, erythema, ecchymosis, effusion, weakness, numbness and tingling.      ROS: As per HPI.  Past Medical History:  Diagnosis Date  . Allergy    seasonal  . Anal fissure   . Diverticulitis   . GERD (gastroesophageal reflux disease) 07/07/2005  . Hemorrhoid   . Hepatic steatosis   . Hypertension   . Internal hemorrhoids   . Positive H. pylori test    Past Surgical History:  Procedure Laterality Date  . COLON RESECTION  2000's Dr. Hassell Done   Diverticulitis.  . COLONOSCOPY    . FOOT SURGERY Left   . g3p2 c-section1    . KNEE SURGERY Right   . TONSILLECTOMY    . TUBAL LIGATION     Allergies  Allergen Reactions  . Dilaudid [Hydromorphone Hcl] Nausea Only  . Ibuprofen Swelling  . Lisinopril Swelling    REACTION: angioedema   No current facility-administered medications on file prior to encounter.    Current Outpatient Medications on File Prior to Encounter  Medication Sig Dispense Refill  . Cholecalciferol (VITAMIN D3) 5000 UNITS TABS Take 1 tablet by mouth daily.    . Multiple Vitamin (MULTIVITAMIN WITH MINERALS) TABS tablet Take 1 tablet by mouth daily.    Marland Kitchen olmesartan-hydrochlorothiazide (BENICAR HCT) 40-12.5 MG tablet Take 1 tablet by mouth daily.    . Omega-3 Fatty Acids (OMEGA 3 PO) Take by mouth.     Social History   Socioeconomic History  . Marital status: Divorced    Spouse name: Not on file  . Number of children: 3  . Years of education: 39    . Highest education level: Not on file  Occupational History    Employer: NOT EMPLOYED  Social Needs  . Financial resource strain: Not on file  . Food insecurity:    Worry: Not on file    Inability: Not on file  . Transportation needs:    Medical: Not on file    Non-medical: Not on file  Tobacco Use  . Smoking status: Never Smoker  . Smokeless tobacco: Never Used  Substance and Sexual Activity  . Alcohol use: No    Alcohol/week: 0.0 standard drinks  . Drug use: No  . Sexual activity: Never  Lifestyle  . Physical activity:    Days per week: Not on file    Minutes per session: Not on file  . Stress: Not on file  Relationships  . Social connections:    Talks on phone: Not on file    Gets together: Not on file    Attends religious service: Not on file    Active member of club or organization: Not on file    Attends meetings of clubs or organizations: Not on file    Relationship status: Not on file  . Intimate partner violence:    Fear of current or ex partner: Not on file    Emotionally abused: Not on file    Physically abused: Not  on file    Forced sexual activity: Not on file  Other Topics Concern  . Not on file  Social History Narrative   [employer], [employer] - Human service/sociology. Married '2000 - 48yrs/seperated. 2 boys - '82, '87, 1 dtr - '81.   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    Family History  Problem Relation Age of Onset  . Hypertension Mother   . Hypertension Father   . Heart disease Father   . Diabetes Neg Hx   . COPD Neg Hx     OBJECTIVE:  Vitals:   03/28/18 1636 03/28/18 1640  BP: 127/81   Pulse: 94   Resp: (!) 97 18  Temp: 97.9 F (36.6 C)   TempSrc: Oral   SpO2: 98%     General appearance: AOx3; in no acute distress.  Head: NCAT Lungs: CTA bilaterally Heart: RRR.  Clear S1 and S2 without murmur, gallops, or rubs.  Radial pulses 2+ bilaterally. Musculoskeletal: Left knee Inspection: Skin warm, dry, clear and intact without obvious erythema,  effusion, or ecchymosis.  Palpation: Tender about the posterior aspect of the knee; no calf tenderness ROM: FROM active and passive Strength:  5/5 hip flexion, 5/5 knee abduction, 5/5 knee adduction, 5/5 knee flexion, 5/5 knee extension, 5/5 dorsiflexion, 5/5 plantar flexion Stability: Anterior/ posterior drawer intact Skin: warm and dry Neurologic: Ambulates without difficulty; Sensation intact about the upper/ lower extremities Psychological: alert and cooperative; normal mood and affect  ASSESSMENT & PLAN:  1. Acute pain of left knee   2. Synovial cyst of left popliteal space    No orders of the defined types were placed in this encounter.  Brace given Continue conservative management of rest, ice, compression, and elevation Take tylenol 500mg  as needed for pain relief  Follow up with orthopedist next week for reevaluation and management Return or go to the ER if you have any new or worsening symptoms (fever, chills, chest pain, abdominal pain, changes in bowel or bladder habits, pain radiating into lower legs, etc...)   Reviewed expectations re: course of current medical issues. Questions answered. Outlined signs and symptoms indicating need for more acute intervention. Patient verbalized understanding. After Visit Summary given.    Lestine Box, PA-C 03/28/18 [place], [place], PA-C 03/28/18 1726

## 2018-03-28 NOTE — ED Triage Notes (Signed)
Pt c/o pain in the back of her L knee, denies injury. Pain x1 week.

## 2018-04-16 ENCOUNTER — Other Ambulatory Visit (HOSPITAL_COMMUNITY)
Admission: RE | Admit: 2018-04-16 | Discharge: 2018-04-16 | Disposition: A | Discharge status: Home / Self Care | Payer: BLUE CROSS/BLUE SHIELD | Source: Ambulatory Visit | Attending: Obstetrics and Gynecology | Admitting: Obstetrics and Gynecology

## 2018-04-16 ENCOUNTER — Other Ambulatory Visit: Payer: Self-pay | Admitting: Obstetrics and Gynecology

## 2018-04-16 DIAGNOSIS — Z01411 Encounter for gynecological examination (general) (routine) with abnormal findings: Secondary | ICD-10-CM | POA: Insufficient documentation

## 2018-04-18 LAB — CYTOLOGY - PAP
Diagnosis: NEGATIVE
HPV: NOT DETECTED

## 2018-07-04 ENCOUNTER — Other Ambulatory Visit: Payer: Self-pay | Admitting: Obstetrics and Gynecology

## 2018-07-04 DIAGNOSIS — N644 Mastodynia: Secondary | ICD-10-CM

## 2018-07-08 ENCOUNTER — Other Ambulatory Visit: Payer: BLUE CROSS/BLUE SHIELD

## 2018-07-12 ENCOUNTER — Ambulatory Visit
Admission: RE | Admit: 2018-07-12 | Discharge: 2018-07-12 | Disposition: A | Discharge status: Home / Self Care | Payer: BLUE CROSS/BLUE SHIELD | Source: Ambulatory Visit | Attending: Obstetrics and Gynecology | Admitting: Obstetrics and Gynecology

## 2018-07-12 ENCOUNTER — Other Ambulatory Visit: Payer: Self-pay | Admitting: Obstetrics and Gynecology

## 2018-07-12 DIAGNOSIS — N644 Mastodynia: Secondary | ICD-10-CM

## 2018-07-12 DIAGNOSIS — N6310 Unspecified lump in the right breast, unspecified quadrant: Secondary | ICD-10-CM

## 2018-07-12 HISTORY — PX: BREAST BIOPSY: SHX20

## 2018-07-16 ENCOUNTER — Encounter: Payer: Self-pay | Admitting: *Deleted

## 2018-07-16 ENCOUNTER — Telehealth: Payer: Self-pay | Admitting: Hematology and Oncology

## 2018-07-16 DIAGNOSIS — C50311 Malignant neoplasm of lower-inner quadrant of right female breast: Secondary | ICD-10-CM | POA: Insufficient documentation

## 2018-07-16 DIAGNOSIS — Z170 Estrogen receptor positive status [ER+]: Secondary | ICD-10-CM

## 2018-07-16 HISTORY — DX: Estrogen receptor positive status (ER+): C50.311

## 2018-07-16 HISTORY — DX: Malignant neoplasm of lower-inner quadrant of right female breast: Z17.0

## 2018-07-16 NOTE — Telephone Encounter (Signed)
Spoke with patient to confirm morning [facility] appointment for 12/4, packet will be mailed to patient

## 2018-07-23 NOTE — Progress Notes (Signed)
[facility] NOTE  Patient Care Team: [name], [name], MD as PCP - General (Family Medicine) Erroll Luna, MD as Consulting Physician (General Surgery) Nicholas Lose, MD as Consulting Physician (Hematology and Oncology) Kyung Rudd, MD as Consulting Physician (Radiation Oncology)  CHIEF COMPLAINTS/PURPOSE OF CONSULTATION:  Newly diagnosed breast cancer  HISTORY OF PRESENTING ILLNESS:  [name] [name] 52 y.o. female is here because of recent diagnosis of invasive ductal carcinoma of the right breast. She complained of pain in her right breast near the underarm which was not the location of the cancer but led to a mammogram. The cancer was found by a routine diagnostic mammogram on 07/12/18 that showed a suspicious mass in the 3:30 o'clock location of the right breast measuring 0.6 x 0.4 x 0.5. The tumor was not palpable prior to diagnosis. A biopsy on 07/12/18 showed the tumor to be grade 3, Her2 negative, ER positive, and PR negative.   She presents to the clinic today with two family members and another on the phone. Her family member asked about ways to decrease estrogen naturally.   I reviewed her records extensively and collaborated the history with the patient.  REVIEW OF SYSTEMS:   Constitutional: Denies fevers, chills or abnormal night sweats Eyes: Denies blurriness of vision, double vision or watery eyes Ears, nose, mouth, throat, and face: Denies mucositis or sore throat Respiratory: Denies cough, dyspnea or wheezes Cardiovascular: Denies palpitation, chest discomfort or lower extremity swelling Gastrointestinal:  Denies nausea, heartburn or change in bowel habits Skin: Denies abnormal skin rashes Lymphatics: Denies new lymphadenopathy or easy bruising Neurological:Denies numbness, tingling or new weaknesses Behavioral/Psych: Mood is stable, no new changes  Breast: Denies any palpable lumps or discharge All other systems were reviewed with the  patient and are negative.  SUMMARY OF ONCOLOGIC HISTORY:   Malignant neoplasm of lower-inner quadrant of right breast of female, estrogen receptor positive ([place])   07/16/2018 Initial Diagnosis    Pain right breast UOQ: Negative on mammogram, incidentally found right breast mass LIQ at 3:30 position measuring 0.6 cm biopsy revealed grade 3 IDC ER 40%, PR 0%, HER-2 negative, Ki-67 50%, T1bN0 stage Ib    07/24/2018 Cancer Staging    Staging form: Breast, AJCC 8th Edition - Clinical: Stage IB (cT1b, cN0, cM0, G3, ER+, PR-, HER2-) - Signed by Nicholas Lose, MD on 07/24/2018      MEDICAL HISTORY:  Past Medical History:  Diagnosis Date  . Allergy    seasonal  . Anal fissure   . Diverticulitis   . GERD (gastroesophageal reflux disease) 07/07/2005  . Hemorrhoid   . Hepatic steatosis   . Hypertension   . Internal hemorrhoids   . Malignant neoplasm of lower-inner quadrant of right breast of female, estrogen receptor positive ([place]) 07/16/2018  . Positive H. pylori test     SURGICAL HISTORY: Past Surgical History:  Procedure Laterality Date  . COLON RESECTION  2000's Dr. Hassell Done   Diverticulitis.  . COLONOSCOPY    . FOOT SURGERY Left   . g3p2 c-section1    . KNEE SURGERY Right   . TONSILLECTOMY    . TUBAL LIGATION      SOCIAL HISTORY: Social History   Socioeconomic History  . Marital status: Divorced    Spouse name: Not on file  . Number of children: 3  . Years of education: 28  . Highest education level: Not on file  Occupational History    Employer: NOT EMPLOYED  Social Needs  .  Financial resource strain: Not on file  . Food insecurity:    Worry: Not on file    Inability: Not on file  . Transportation needs:    Medical: Not on file    Non-medical: Not on file  Tobacco Use  . Smoking status: Never Smoker  . Smokeless tobacco: Never Used  Substance and Sexual Activity  . Alcohol use: No    Alcohol/week: 0.0 standard drinks  . Drug use: No  . Sexual activity:  Never  Lifestyle  . Physical activity:    Days per week: Not on file    Minutes per session: Not on file  . Stress: Not on file  Relationships  . Social connections:    Talks on phone: Not on file    Gets together: Not on file    Attends religious service: Not on file    Active member of club or organization: Not on file    Attends meetings of clubs or organizations: Not on file    Relationship status: Not on file  . Intimate partner violence:    Fear of current or ex partner: Not on file    Emotionally abused: Not on file    Physically abused: Not on file    Forced sexual activity: Not on file  Other Topics Concern  . Not on file  Social History Narrative   HSG, [employer] - Human service/sociology. Married '2000 - 59yr/seperated. 2 boys - '82, '87, 1 dtr - '81.   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     FAMILY HISTORY: Family History  Problem Relation Age of Onset  . Hypertension Mother   . Hypertension Father   . Heart disease Father   . Diabetes Neg Hx   . COPD Neg Hx     ALLERGIES:  is allergic to dilaudid [hydromorphone hcl]; ibuprofen; and lisinopril.  MEDICATIONS:  Current Outpatient Medications  Medication Sig Dispense Refill  . Cholecalciferol (VITAMIN D3) 5000 UNITS TABS Take 1 tablet by mouth daily.    . Multiple Vitamin (MULTIVITAMIN WITH MINERALS) TABS tablet Take 1 tablet by mouth daily.    .Marland Kitchenolmesartan-hydrochlorothiazide (BENICAR HCT) 40-12.5 MG tablet Take 1 tablet by mouth daily.    . naproxen (NAPROSYN) 500 MG tablet Take 1 tablet (500 mg total) by mouth 2 (two) times daily. (Patient not taking: Reported on 07/24/2018) 30 tablet 0  . Omega-3 Fatty Acids (OMEGA 3 PO) Take by mouth.     No current facility-administered medications for this visit.     PHYSICAL EXAMINATION: ECOG PERFORMANCE STATUS: 0 - Asymptomatic  Vitals:   07/24/18 0851  BP: (!) 146/78  Pulse: 81  Resp: 17  Temp: 98.3 F (36.8 C)  SpO2: 100%   Filed Weights   07/24/18 0851   Weight: 255 lb (115.7 kg)    GENERAL:alert, no distress and comfortable SKIN: skin color, texture, turgor are normal, no rashes or significant lesions EYES: normal, conjunctiva are pink and non-injected, sclera clear OROPHARYNX:no exudate, no erythema and lips, buccal mucosa, and tongue normal  NECK: supple, thyroid normal size, non-tender, without nodularity LYMPH:  no palpable lymphadenopathy in the cervical, axillary or inguinal LUNGS: clear to auscultation and percussion with normal breathing effort HEART: regular rate & rhythm and no murmurs and no lower extremity edema ABDOMEN:abdomen soft, non-tender and normal bowel sounds Musculoskeletal:no cyanosis of digits and no clubbing  PSYCH: alert & oriented x 3 with fluent speech NEURO: no focal motor/sensory deficits  LABORATORY DATA:  I have reviewed the data  as listed Lab Results  Component Value Date   WBC 5.6 07/24/2018   HGB 12.9 07/24/2018   HCT 39.4 07/24/2018   MCV 89.5 07/24/2018   PLT 143 (L) 07/24/2018   Lab Results  Component Value Date   NA 143 09/29/2015   K 3.2 (L) 09/29/2015   CL 104 09/29/2015   CO2 26 09/29/2015    RADIOGRAPHIC STUDIES: I have personally reviewed the radiological reports and agreed with the findings in the report.  ASSESSMENT AND PLAN:  Malignant neoplasm of lower-inner quadrant of right breast of female, estrogen receptor positive ([place]) 07/12/2018: Pain right breast UOQ: Negative on mammogram, incidentally found right breast mass LIQ at 3:30 position measuring 0.6 cm biopsy revealed grade 3 IDC ER 40%, PR 0%, HER-2 negative, Ki-67 50%, T1bN0 stage Ib  Pathology and radiology counseling:Discussed with the patient, the details of pathology including the type of breast cancer,the clinical staging, the significance of ER, PR and HER-2/neu receptors and the implications for treatment. After reviewing the pathology in detail, we proceeded to discuss the different treatment options between  surgery, radiation, chemotherapy, antiestrogen therapies.  Recommendations: 1. Breast conserving surgery followed by 2. Oncotype DX testing to determine if chemotherapy would be of any benefit followed by 3. Adjuvant radiation therapy followed by 4. Adjuvant antiestrogen therapy  Oncotype counseling: I discussed Oncotype DX test. I explained to the patient that this is a 21 gene panel to evaluate patient tumors DNA to calculate recurrence score. This would help determine whether patient has high risk or intermediate risk or low risk breast cancer. She understands that if her tumor was found to be high risk, she would benefit from systemic chemotherapy. If low risk, no need of chemotherapy. If she was found to be intermediate risk, we would need to evaluate the score as well as other risk factors and determine if an abbreviated chemotherapy may be of benefit.  Return to clinic after surgery to discuss final pathology report and then determine if Oncotype DX testing will need to be sent.       All questions were answered. The patient knows to call the clinic with any problems, questions or concerns.    Harriette Ohara, MD 07/24/2018   I, Molly Dorshimer, am acting as scribe for Nicholas Lose, MD.  I have reviewed the above documentation for accuracy and completeness, and I agree with the above.

## 2018-07-24 ENCOUNTER — Other Ambulatory Visit: Payer: Self-pay

## 2018-07-24 ENCOUNTER — Inpatient Hospital Stay: Payer: BLUE CROSS/BLUE SHIELD | Attending: Hematology and Oncology | Admitting: Hematology and Oncology

## 2018-07-24 ENCOUNTER — Encounter: Payer: Self-pay | Admitting: Physical Therapy

## 2018-07-24 ENCOUNTER — Ambulatory Visit
Admission: RE | Admit: 2018-07-24 | Discharge: 2018-07-24 | Disposition: A | Discharge status: Home / Self Care | Payer: BLUE CROSS/BLUE SHIELD | Source: Ambulatory Visit | Attending: Radiation Oncology | Admitting: Radiation Oncology

## 2018-07-24 ENCOUNTER — Encounter: Payer: Self-pay | Admitting: *Deleted

## 2018-07-24 ENCOUNTER — Inpatient Hospital Stay: Payer: BLUE CROSS/BLUE SHIELD

## 2018-07-24 ENCOUNTER — Ambulatory Visit: Payer: Self-pay | Admitting: Surgery

## 2018-07-24 ENCOUNTER — Ambulatory Visit: Payer: BLUE CROSS/BLUE SHIELD | Admitting: Physical Therapy

## 2018-07-24 DIAGNOSIS — C50911 Malignant neoplasm of unspecified site of right female breast: Secondary | ICD-10-CM

## 2018-07-24 DIAGNOSIS — Z170 Estrogen receptor positive status [ER+]: Principal | ICD-10-CM

## 2018-07-24 DIAGNOSIS — R293 Abnormal posture: Secondary | ICD-10-CM | POA: Insufficient documentation

## 2018-07-24 DIAGNOSIS — C50311 Malignant neoplasm of lower-inner quadrant of right female breast: Secondary | ICD-10-CM

## 2018-07-24 DIAGNOSIS — I10 Essential (primary) hypertension: Secondary | ICD-10-CM | POA: Diagnosis not present

## 2018-07-24 DIAGNOSIS — Z79899 Other long term (current) drug therapy: Secondary | ICD-10-CM | POA: Diagnosis not present

## 2018-07-24 LAB — CBC WITH DIFFERENTIAL (CANCER CENTER ONLY)
Abs Immature Granulocytes: 0.01 10*3/uL (ref 0.00–0.07)
Basophils Absolute: 0 10*3/uL (ref 0.0–0.1)
Basophils Relative: 0 %
Eosinophils Absolute: 0.1 10*3/uL (ref 0.0–0.5)
Eosinophils Relative: 3 %
HCT: 39.4 % (ref 36.0–46.0)
Hemoglobin: 12.9 g/dL (ref 12.0–15.0)
Immature Granulocytes: 0 %
Lymphocytes Relative: 32 %
Lymphs Abs: 1.8 10*3/uL (ref 0.7–4.0)
MCH: 29.3 pg (ref 26.0–34.0)
MCHC: 32.7 g/dL (ref 30.0–36.0)
MCV: 89.5 fL (ref 80.0–100.0)
Monocytes Absolute: 0.4 10*3/uL (ref 0.1–1.0)
Monocytes Relative: 7 %
Neutro Abs: 3.2 10*3/uL (ref 1.7–7.7)
Neutrophils Relative %: 58 %
Platelet Count: 143 10*3/uL — ABNORMAL LOW (ref 150–400)
RBC: 4.4 MIL/uL (ref 3.87–5.11)
RDW: 12.8 % (ref 11.5–15.5)
WBC Count: 5.6 10*3/uL (ref 4.0–10.5)
nRBC: 0 % (ref 0.0–0.2)

## 2018-07-24 LAB — CMP (CANCER CENTER ONLY)
ALT: 14 U/L (ref 0–44)
AST: 19 U/L (ref 15–41)
Albumin: 4.2 g/dL (ref 3.5–5.0)
Alkaline Phosphatase: 85 U/L (ref 38–126)
Anion gap: 9 (ref 5–15)
BUN: 13 mg/dL (ref 6–20)
CO2: 29 mmol/L (ref 22–32)
Calcium: 9.9 mg/dL (ref 8.9–10.3)
Chloride: 104 mmol/L (ref 98–111)
Creatinine: 0.99 mg/dL (ref 0.44–1.00)
GFR, Est AFR Am: >60 mL/min (ref >60)
GFR, Estimated: >60 mL/min (ref >60)
Glucose, Bld: 100 mg/dL — ABNORMAL HIGH (ref 70–99)
Potassium: 3.8 mmol/L (ref 3.5–5.1)
Sodium: 142 mmol/L (ref 135–145)
Total Bilirubin: 0.5 mg/dL (ref 0.3–1.2)
Total Protein: 7.8 g/dL (ref 6.5–8.1)

## 2018-07-24 NOTE — Progress Notes (Signed)
Nutrition Assessment  Reason for Assessment:  Pt seen in Breast Clinic  ASSESSMENT:   52 year old female with new diagnosis of breast cancer.  PMHx of HTN.    Patient reports normal appetite.   Medications:  reviewed  Labs: reviewed  Anthropometrics:   Height: 65 inches Weight: 255 lb BMI: 42   NUTRITION DIAGNOSIS: Food and nutrition related knowledge deficit related to new diagnosis of breast cancer as evidenced by no prior need for nutrition related information.  INTERVENTION:   Discussed and provided packet of information regarding nutritional tips for breast cancer patients.  Questions answered.  Teachback method used.  Contact information provided and patient knows to contact me with questions/concerns.    MONITORING, EVALUATION, and GOAL: Pt will consume a healthy plant based diet to maintain lean body mass throughout treatment.   [name] [name] B. Zenia Resides, [place], [place] Registered Dietitian [phone] (pager)

## 2018-07-24 NOTE — Therapy (Signed)
[place], [place], [address] Phone: [phone]   Fax:  [phone]  Physical Therapy Evaluation  Patient Details  Name: [name] [name] MRN: [id number] Date of Birth: Mar 12, 1966 Referring Provider (PT): Dr. Erroll Luna   Encounter Date: 07/24/2018  PT End of Session - 07/24/18 1006    Visit Number  1    Number of Visits  2    Date for PT Re-Evaluation  09/18/18    PT Start Time  0928    PT Stop Time  0950    PT Time Calculation (min)  22 min    Activity Tolerance  Patient tolerated treatment well    Behavior During Therapy  [facility] for tasks assessed/performed       Past Medical History:  Diagnosis Date  . Allergy    seasonal  . Anal fissure   . Diverticulitis   . GERD (gastroesophageal reflux disease) 07/07/2005  . Hemorrhoid   . Hepatic steatosis   . Hypertension   . Internal hemorrhoids   . Malignant neoplasm of lower-inner quadrant of right breast of female, estrogen receptor positive ([place]) 07/16/2018  . Positive H. pylori test     Past Surgical History:  Procedure Laterality Date  . COLON RESECTION  2000's Dr. Hassell Done   Diverticulitis.  . COLONOSCOPY    . FOOT SURGERY Left   . g3p2 c-section1    . KNEE SURGERY Right   . TONSILLECTOMY    . TUBAL LIGATION      There were no vitals filed for this visit.   Subjective Assessment - 07/24/18 0951    Subjective  Patient reports she is here today to be seen by her medical team for her newly diagnosed right breast cancer.    Patient is accompained by:  Family member    Pertinent History  Patient was diagnosed on 07/13/18 with right grade III invasive ductal carcinoma breast cancer. It measures 6 mm, is ER positive and PR negative, HER2 negative with a Ki67 of 50%.     Patient Stated Goals  Reduce lymphedema risk and learn post op shoulder ROM HEP    Currently in Pain?  No/denies         [facility] PT Assessment - 07/24/18 0001       Assessment   Medical Diagnosis  Right breast cancer    Referring Provider (PT)  Dr. [name] Cornett    Onset Date/Surgical Date  07/13/18    Hand Dominance  Right    Prior Therapy  none      Precautions   Precautions  Other (comment)    Precaution Comments  active cancer      Restrictions   Weight Bearing Restrictions  No      Balance Screen   Has the patient fallen in the past 6 months  No    Has the patient had a decrease in activity level because of a fear of falling?   No    Is the patient reluctant to leave their home because of a fear of falling?   No      Home Social worker  Private residence    Living Arrangements  Alone    Available Help at Discharge  Family      Prior Function   Level of Independence  Independent    Vocation  Full time employment    Health and safety inspector at [employer]  She  does not exercise      Cognition   Overall Cognitive Status  Within Functional Limits for tasks assessed      Posture/Postural Control   Posture/Postural Control  Postural limitations    Postural Limitations  Rounded Shoulders;Forward head      ROM / Strength   AROM / PROM / Strength  AROM;Strength      AROM   AROM Assessment Site  Shoulder;Cervical    Right/Left Shoulder  Right;Left    Right Shoulder Extension  40 Degrees    Right Shoulder Flexion  149 Degrees    Right Shoulder ABduction  157 Degrees    Right Shoulder Internal Rotation  50 Degrees    Right Shoulder External Rotation  81 Degrees    Left Shoulder Extension  63 Degrees    Left Shoulder Flexion  149 Degrees    Left Shoulder ABduction  156 Degrees    Left Shoulder Internal Rotation  63 Degrees    Left Shoulder External Rotation  85 Degrees    Cervical Flexion  WNL    Cervical Extension  WNL    Cervical - Right Side Bend  WNL    Cervical - Left Side Bend  WNL    Cervical - Right Rotation  WNL    Cervical - Left Rotation  WNL      Strength   Overall  Strength  Within functional limits for tasks performed        LYMPHEDEMA/ONCOLOGY QUESTIONNAIRE - 07/24/18 0957      Type   Cancer Type  Right breast cancer      Lymphedema Assessments   Lymphedema Assessments  Upper extremities      Right Upper Extremity Lymphedema   10 cm Proximal to Olecranon Process  34.7 cm    Olecranon Process  27 cm    10 cm Proximal to Ulnar Styloid Process  24.4 cm    Just Proximal to Ulnar Styloid Process  17.3 cm    Across Hand at [employer]  21.2 cm    At [place] of 2nd Digit  7.1 cm      Left Upper Extremity Lymphedema   10 cm Proximal to Olecranon Process  35.1 cm    Olecranon Process  27.8 cm    10 cm Proximal to Ulnar Styloid Process  23.9 cm    Just Proximal to Ulnar Styloid Process  17.5 cm    Across Hand at [employer]  21.2 cm    At [place] of 2nd Digit  6.7 cm          Quick Dash - 07/24/18 0001    Open a tight or new jar  No difficulty    Do heavy household chores (wash walls, wash floors)  No difficulty    Carry a shopping bag or briefcase  No difficulty    Wash your back  No difficulty    Use a knife to cut food  No difficulty    Recreational activities in which you take some force or impact through your arm, shoulder, or hand (golf, hammering, tennis)  No difficulty    During the past week, to what extent has your arm, shoulder or hand problem interfered with your normal social activities with family, friends, neighbors, or groups?  Not at all    During the past week, to what extent has your arm, shoulder or hand problem limited your work or other regular daily activities  Not at all  Arm, shoulder, or hand pain.  None    Tingling (pins and needles) in your arm, shoulder, or hand  None    Difficulty Sleeping  No difficulty    DASH Score  0 %        Objective measurements completed on examination: See above findings.     Patient was instructed today in a home exercise program today for post op shoulder range of  motion. These included active assist shoulder flexion in sitting, scapular retraction, wall walking with shoulder abduction, and hands behind head external rotation.  She was encouraged to do these twice a day, holding 3 seconds and repeating 5 times when permitted by her physician.       PT Education - 07/24/18 0958    Education Details  Lymphedema risk reduction and post op shoulder ROM HEP    Person(s) Educated  Patient;Child(ren)    Methods  Explanation;Demonstration;Handout    Comprehension  Returned demonstration;Verbalized understanding          PT Long Term Goals - 07/24/18 1024      PT LONG TERM GOAL #1   Title  Patient will demonstrate she has returned to baseline post operatively related to shoulder ROM and function.    Time  8    Period  Weeks    Status  New      Breast Clinic Goals - 07/24/18 1023      Patient will be able to verbalize understanding of pertinent lymphedema risk reduction practices relevant to her diagnosis specifically related to skin care.   Time  1    Period  Days    Status  Achieved      Patient will be able to return demonstrate and/or verbalize understanding of the post-op home exercise program related to regaining shoulder range of motion.   Time  1    Period  Days    Status  Achieved      Patient will be able to verbalize understanding of the importance of attending the postoperative After Breast Cancer Class for further lymphedema risk reduction education and therapeutic exercise.   Time  1    Status  Achieved            Plan - 07/24/18 1007    Clinical Impression Statement  Patient was diagnosed on 07/13/18 with right grade III invasive ductal carcinoma breast cancer. It measures 6 mm, is ER positive and PR negative, HER2 negative with a Ki67 of 50%. Her multidisciplinary medical team met prior to her assessments to determine a recommended treatment plan. She is planning to have a right lumpectomy and sentinel node biopsy  followed by Oncotype testing, radiation, and anti-estrogen therapy. She will benefit from a post op PT reassessment to determine needs.    History and Personal Factors relevant to plan of care:  Lives alone    Clinical Presentation  Stable    Clinical Decision Making  Low    Rehab Potential  Excellent    Clinical Impairments Affecting Rehab Potential  None    PT Frequency  --   Eval and 1 f/u visit   PT Treatment/Interventions  ADLs/Self Care Home Management;Therapeutic exercise;Patient/family education    PT Next Visit Plan  Will reassess 3-4 weeks post op    PT Home Exercise Plan  Post op shoulder ROM HEP    Consulted and Agree with Plan of Care  Patient;Family member/caregiver    Family Member Consulted  daughter (son via facetime)  Patient will benefit from skilled therapeutic intervention in order to improve the following deficits and impairments:  Postural dysfunction, Decreased range of motion, Impaired UE functional use, Pain, Decreased knowledge of precautions  Visit Diagnosis: Malignant neoplasm of lower-inner quadrant of right breast of female, estrogen receptor positive ([place]) - Plan: PT plan of care cert/re-cert  Abnormal posture - Plan: PT plan of care cert/re-cert   Patient will follow up at outpatient cancer rehab 3-4 weeks following surgery.  If the patient requires physical therapy at that time, a specific plan will be dictated and sent to the referring physician for approval. The patient was educated today on appropriate basic range of motion exercises to begin post operatively and the importance of attending the After Breast Cancer class following surgery.  Patient was educated today on lymphedema risk reduction practices as it pertains to recommendations that will benefit the patient immediately following surgery.  She verbalized good understanding.      Problem List Patient Active Problem List   Diagnosis Date Noted  . Malignant neoplasm of lower-inner  quadrant of right breast of female, estrogen receptor positive ([place]) 07/16/2018  . Special screening for malignant neoplasms, colon 07/10/2017  . Anal fissure 07/07/2014  . Pruritic rash 06/16/2014  . Obesity 06/16/2014  . Primary localized osteoarthrosis, lower leg 11/18/2013  . Bilateral knee pain 09/08/2013  . VITAMIN B12 DEFICIENCY 02/10/2010  . Essential hypertension 08/16/2007  . ALLERGIC RHINITIS 08/16/2007  . GERD 08/16/2007  . Right upper quadrant pain 05/13/2007   Annia Friendly, PT 07/24/18 10:29 AM  [place], [place], [address] Phone: [phone]   Fax:  [phone]  Name: Arina Torry MRN: [id number] Date of Birth: 1966/05/02

## 2018-07-24 NOTE — Patient Instructions (Signed)

## 2018-07-24 NOTE — H&P (View-Only) (Signed)
[name] Documented: 07/24/2018 7:24 AM Location: [facility] Patient #: [id number] DOB: [birth year] Undefined / Language: [name] / Race: Black or African American Female  History of Present Illness [name] A. Xzaria Teo MD; 07/24/2018 11:52 AM) Patient words: Pt sent at the request of Dr Lisbeth Renshaw for mammographic abnormality of her right breas and right breast pain for 1 month. The patient has soreness of right breast upper outer quadrant. Mammogram showed a 6 mm mass right lower inner quadrant IDC ER PR POS HER 2 NEU NEGATIVE.  ADDITIONAL INFORMATION: PROGNOSTIC INDICATORS Results: IMMUNOHISTOCHEMICAL AND MORPHOMETRIC ANALYSIS PERFORMED MANUALLY The tumor cells are NEGATIVE for Her2 (0). Estrogen Receptor: 40%, POSITIVE, WEAK STAINING INTENSITY Progesterone Receptor: 0%, NEGATIVE Proliferation Marker Ki67: 50% COMMENT: The negative hormone receptor study(ies) in this case has an internal positive control. REFERENCE RANGE ESTROGEN RECEPTOR NEGATIVE 0% POSITIVE =>1% REFERENCE RANGE PROGESTERONE RECEPTOR NEGATIVE 0% POSITIVE =>1% All controls stained appropriately [name] MD Pathologist, Electronic Signature ( Signed 07/16/2018) FINAL DIAGNOSIS Diagnosis Breast, right, needle core biopsy, lower inner quadrant, 3:30 o'clock, ribbon clip - INVASIVE DUCTAL CARCINOMA. 1 of 3 FINAL for [name], [name] [phone]) Microscopic Comment The carcinoma appears grade III. A breast prognostic profile will be performed and reported separately. The results were reported to [facility] on 07/15/2018. Intradepartmental consultation was obtained (Dr. Lyndon Code). Gillie Manners MD Pathologist, Electronic Signature (Case signed 07/15/2018) Specimen Gross and Clinical Information Specimen Comment Formalin time; 1:15 PM; extracted < 5 minutes; new mass; Specimen(s) Obtained: Breast, right, needle core biopsy, lower     Patient reports  persistent pain in the UPPER-OUTER QUADRANT of the RIGHT breast. Pain comes and goes and has been present since before her last mammogram.  EXAM: DIGITAL DIAGNOSTIC BILATERAL MAMMOGRAM WITH CAD AND TOMO  ULTRASOUND RIGHT BREAST  COMPARISON: 01/29/2017 and earlier  ACR Breast Density Category b: There are scattered areas of fibroglandular density.  FINDINGS: In the LOWER INNER QUADRANT of the RIGHT breast there is a small round mass with spiculated margins, confirmed on spot compression views. In the UPPER-OUTER QUADRANT of the RIGHT breast in the area of focal tenderness, no abnormality is identified. Normal appearing intramammary and RIGHT axillary lymph nodes are present.  LEFT breast is negative.  Mammographic images were processed with CAD.  On physical exam, I palpate no abnormality in the MEDIAL aspect of the RIGHT breast.  Targeted ultrasound is performed, showing an oval mass with irregular margins in the 3:30 o'clock location of the RIGHT breast 2 centimeters from the nipple. Mass is hypoechoic and associated with needed posterior acoustic enhancement or shadowing. There is no internal blood flow. Mass measures 0.6 x 0.4 x 0.5 centimeters. Evaluation of the UPPER-OUTER QUADRANT of the RIGHT breast in the area of focal tenderness is unremarkable. Evaluation of the RIGHT axilla is negative.  IMPRESSION: 1. Suspicious mass in the 3:30 o'clock location of the RIGHT breast. 2. No RIGHT axillary adenopathy. 3. No mammographic or sonographic abnormality in the area of focal tenderness in the UPPER-OUTER QUADRANT of the RIGHT breast. 4. LEFT breast is negative.  RECOMMENDATION: Ultrasound-guided core biopsy of mass in the 3 o'clock location of the RIGHT breast. This will be performed later today and dictated separately.  I have discussed the findings and recommendations with the patient. Results were also provided in writing at the conclusion of the visit. If  applicable, a reminder letter will be sent to the patient regarding the next appointment.  BI-RADS CATEGORY 4: Suspicious.   Electronically  Signed By: Nolon Nations M.D. On: 07/12/2018 11:39.  The patient is a 52 year old female.   Past Surgical History Tawni Pummel, RN; 07/24/2018 7:24 AM) Breast Biopsy Right. Cesarean Section - 1 Knee Surgery Right.  Diagnostic Studies History Tawni Pummel, RN; 07/24/2018 7:24 AM) Colonoscopy within last year Mammogram within last year Pap Smear 1-5 years ago  Medication History Tawni Pummel, RN; 07/24/2018 7:24 AM) Medications Reconciled  Social History Tawni Pummel, RN; 07/24/2018 7:24 AM) Caffeine use Coffee. No alcohol use No drug use Tobacco use Never smoker.  Family History Tawni Pummel, RN; 07/24/2018 7:24 AM) Heart Disease Father.  Pregnancy / Birth History Tawni Pummel, RN; 07/24/2018 7:24 AM) Age at menarche 1 years. Age of menopause 69-50 Contraceptive History Intrauterine device, Oral contraceptives. Gravida 3 Maternal age 33-20 Para 3 Regular periods  Other Problems Tawni Pummel, RN; 07/24/2018 7:24 AM) Diverticulosis High blood pressure     Review of Systems (Jaeveon Ashland A. Laquinton Bihm MD; 07/24/2018 11:52 AM) General Not Present- Appetite Loss, Chills, Fatigue, Fever, Night Sweats, Weight Gain and Weight Loss. Skin Not Present- Change in Wart/Mole, Dryness, Hives, Jaundice, New Lesions, Non-Healing Wounds, Rash and Ulcer. HEENT Present- Wears glasses/contact lenses. Not Present- Earache, Hearing Loss, Hoarseness, Nose Bleed, Oral Ulcers, Ringing in the Ears, Seasonal Allergies, Sinus Pain, Sore Throat, Visual Disturbances and Yellow Eyes. Respiratory Not Present- Bloody sputum, Chronic Cough, Difficulty Breathing, Snoring and Wheezing. Breast Not Present- Breast Mass, Breast Pain, Nipple Discharge and Skin Changes. Cardiovascular Not Present- Chest Pain, Difficulty Breathing  Lying Down, Leg Cramps, Palpitations, Rapid Heart Rate, Shortness of Breath and Swelling of Extremities. Gastrointestinal Not Present- Abdominal Pain, Bloating, Bloody Stool, Change in Bowel Habits, Chronic diarrhea, Constipation, Difficulty Swallowing, Excessive gas, Gets full quickly at meals, Hemorrhoids, Indigestion, Nausea, Rectal Pain and Vomiting. Female Genitourinary Not Present- Frequency, Nocturia, Painful Urination, Pelvic Pain and Urgency. Musculoskeletal Not Present- Back Pain, Joint Pain, Joint Stiffness, Muscle Pain, Muscle Weakness and Swelling of Extremities. Neurological Not Present- Decreased Memory, Fainting, Headaches, Numbness, Seizures, Tingling, Tremor, Trouble walking and Weakness. Psychiatric Present- Anxiety. Not Present- Bipolar, Change in Sleep Pattern, Depression, Fearful and Frequent crying. Endocrine Not Present- Cold Intolerance, Excessive Hunger, Hair Changes, Heat Intolerance, Hot flashes and New Diabetes. Hematology Not Present- Blood Thinners, Easy Bruising, Excessive bleeding, Gland problems, HIV and Persistent Infections. All other systems negative   Physical Exam (Takiyah Bohnsack A. Rosalva Neary MD; 07/24/2018 11:52 AM)  General Mental Status-Alert. General Appearance-Consistent with stated age. Hydration-Well hydrated. Voice-Normal.  Head and Neck Head-normocephalic, atraumatic with no lesions or palpable masses. Trachea-midline. Thyroid Gland Characteristics - normal size and consistency.  Chest and Lung Exam Chest and lung exam reveals -quiet, even and easy respiratory effort with no use of accessory muscles and on auscultation, normal breath sounds, no adventitious sounds and normal vocal resonance. Inspection Chest Wall - Normal. Back - normal.  Breast Breast - Left-Symmetric, Non Tender, No Biopsy scars, no Dimpling, No Inflammation, No Lumpectomy scars, No Mastectomy scars, No Peau d' Orange. Breast - Right-Symmetric, Non Tender, No  Biopsy scars, no Dimpling, No Inflammation, No Lumpectomy scars, No Mastectomy scars, No Peau d' Orange. Breast Lump-No Palpable Breast Mass.  Cardiovascular Cardiovascular examination reveals -normal heart sounds, regular rate and rhythm with no murmurs and normal pedal pulses bilaterally.  Neurologic Neurologic evaluation reveals -alert and oriented x 3 with no impairment of recent or remote memory. Mental Status-Normal.  Musculoskeletal Normal Exam - Left-Upper Extremity Strength Normal and Lower Extremity Strength Normal. Normal Exam - Right-Upper Extremity Strength  Normal and Lower Extremity Strength Normal.  Lymphatic Head & Neck  General Head & Neck Lymphatics: Bilateral - Description - Normal. Axillary  General Axillary Region: Bilateral - Description - Normal. Tenderness - Non Tender.    Assessment & Plan (Doctor Sheahan A. Fardeen Steinberger MD; 07/24/2018 11:53 AM)  BREAST CANCER, RIGHT (C50.911) Impression: Pt has opted for right breast seed lumpectomy and right sentinel lymph node mapping. Risk of lumpectomy include bleeding, infection, seroma, more surgery, use of seed/wire, wound care, cosmetic deformity and the need for other treatments, death , blood clots, death. Pt agrees to proceed. Risk of sentinel lymph node mapping include bleeding, infection, lymphedema, shoulder pain. stiffness, dye allergy. cosmetic deformity , blood clots, death, need for more surgery. Pt agres to proceed.  Current Plans You are being scheduled for surgery- Our schedulers will call you.  You should hear from our office's scheduling department within 5 working days about the location, date, and time of surgery. We try to make accommodations for patient's preferences in scheduling surgery, but sometimes the OR schedule or the surgeon's schedule prevents [name] from making those accommodations.  If you have not heard from our office [phone]) in 5 working days, call the office and ask for your  surgeon's nurse.  If you have other questions about your diagnosis, plan, or surgery, call the office and ask for your surgeon's nurse.  Pt Education - [facility] Breast Cancer Information Given - [facility] "Breast Journey" Package Pt Education - Pamphlet Given - Breast Biopsy: discussed with patient and provided information. We discussed the staging and pathophysiology of breast cancer. We discussed all of the different options for treatment for breast cancer including surgery, chemotherapy, radiation therapy, Herceptin, and antiestrogen therapy. We discussed a sentinel lymph node biopsy as she does not appear to having lymph node involvement right now. We discussed the performance of that with injection of radioactive tracer and blue dye. We discussed that she would have an incision underneath her axillary hairline. We discussed that there is a bout a 10-20% chance of having a positive node with a sentinel lymph node biopsy and we will await the permanent pathology to make any other first further decisions in terms of her treatment. One of these options might be to return to the operating room to perform an axillary lymph node dissection. We discussed about a 1-2% risk lifetime of chronic shoulder pain as well as lymphedema associated with a sentinel lymph node biopsy. We discussed the options for treatment of the breast cancer which included lumpectomy versus a mastectomy. We discussed the performance of the lumpectomy with a wire placement. We discussed a 10-20% chance of a positive margin requiring reexcision in the operating room. We also discussed that she may need radiation therapy or antiestrogen therapy or both if she undergoes lumpectomy. We discussed the mastectomy and the postoperative care for that as well. We discussed that there is no difference in her survival whether she undergoes lumpectomy with radiation therapy or antiestrogen therapy versus a mastectomy. There is a slight difference in the local  recurrence rate being 3-5% with lumpectomy and about 1% with a mastectomy. We discussed the risks of operation including bleeding, infection, possible reoperation. She understands her further therapy will be based on what her stages at the time of her operation.  Pt Education - [facility] breast cancer surgery: discussed with patient and provided information. Pt Education - [facility] Breast Biopsy HCI: discussed with patient and provided information. Pt Education - ABC (After Breast Cancer) Class Info:  discussed with patient and provided information. Pt Education - [facility] Breast Pains Education

## 2018-07-24 NOTE — Progress Notes (Signed)
Clinical Social Work [name] [name] Psychosocial Distress Screening [place]  Patient completed distress screening protocol and scored a 8 on the Psychosocial Distress Thermometer which indicates moderate distress. Clinical Social Worker met with patient and patients family in [facility] to assess for distress and other psychosocial needs. Patient stated she was feeling overwhelmed but felt "better" after meeting with the treatment team and getting more information on her treatment plan. CSW and patient discussed common feeling and emotions when being diagnosed with cancer, and the importance of support during treatment. CSW informed patient of the support team and support services at [facility], and patient was agreeable to an [employer] referral. CSW provided contact information and encouraged patient to call with any questions or concerns.  ONCBCN DISTRESS SCREENING 07/24/2018  Screening Type Initial Screening  Distress experienced in past week (1-10) 8  Practical problem type Housing;Insurance  Emotional problem type Depression;Nervousness/Anxiety;Adjusting to illness;Feeling hopeless;Boredom  Spiritual/Religous concerns type Relating to God;Loss of Faith  Information Concerns Type Lack of info about diagnosis;Lack of info about treatment;Lack of info about complementary therapy choices  Physical Problem type Pain  Physician notified of physical symptoms Yes  Referral to support programs Yes     Johnnye Lana, MSW, LCSW, OSW-C Clinical Social Worker [place] [phone]

## 2018-07-24 NOTE — Progress Notes (Signed)
Radiation Oncology         [phone] ________________________________  Name: [name] [name]        MRN: [id number]  Date of Service: 07/24/2018 DOB: 03-24-1966  JM:[id number], [name], MD  Erroll Luna, MD     REFERRING PHYSICIAN: Erroll Luna, MD   DIAGNOSIS: The encounter diagnosis was Malignant neoplasm of lower-inner quadrant of right breast of female, estrogen receptor positive ([place]).   HISTORY OF PRESENT ILLNESS: [name] [name] is a 52 y.o. female seen in the multidisciplinary breast clinic for a new diagnosis of right breast cancer. The patient was noted to have a palpable area of pain in the right breast in the upper outer quadrant that prompted diagnostic imaging. Interestingly there was no focal findings where the patient had pain but did detect a mass in the lower inner quadrant at 3:30 measuring x 5 x 4 mm, and her axilla was negative for adenopathy. She underwent a biopsy of the 3:30 mass on 07/12/18 that revealed a grade 3 invasive ductal carcinoma. Her tumor was ER positive, PR negative, and HER2 was negative with a Ki 67 of 50%. She comes today to discuss options of treatment of her cancer.    PREVIOUS RADIATION THERAPY: No   PAST MEDICAL HISTORY:  Past Medical History:  Diagnosis Date  . Allergy    seasonal  . Anal fissure   . Diverticulitis   . GERD (gastroesophageal reflux disease) 07/07/2005  . Hemorrhoid   . Hepatic steatosis   . Hypertension   . Internal hemorrhoids   . Malignant neoplasm of lower-inner quadrant of right breast of female, estrogen receptor positive ([place]) 07/16/2018  . Positive H. pylori test        PAST SURGICAL HISTORY: Past Surgical History:  Procedure Laterality Date  . COLON RESECTION  2000's Dr. Hassell Done   Diverticulitis.  . COLONOSCOPY    . FOOT SURGERY Left   . g3p2 c-section1    . KNEE SURGERY Right   . TONSILLECTOMY    . TUBAL LIGATION       FAMILY HISTORY:  Family History  Problem Relation Age  of Onset  . Hypertension Mother   . Hypertension Father   . Heart disease Father   . Diabetes Neg Hx   . COPD Neg Hx      SOCIAL HISTORY:  reports that she has never smoked. She has never used smokeless tobacco. She reports that she does not drink alcohol or use drugs. The patient is divorced and lives in [place]. 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    ALLERGIES: Dilaudid [hydromorphone hcl]; Ibuprofen; and Lisinopril   MEDICATIONS:  Current Outpatient Medications  Medication Sig Dispense Refill  . Cholecalciferol (VITAMIN D3) 5000 UNITS TABS Take 1 tablet by mouth daily.    . Multiple Vitamin (MULTIVITAMIN WITH MINERALS) TABS tablet Take 1 tablet by mouth daily.    . naproxen (NAPROSYN) 500 MG tablet Take 1 tablet (500 mg total) by mouth 2 (two) times daily. (Patient not taking: Reported on 07/24/2018) 30 tablet 0  . olmesartan-hydrochlorothiazide (BENICAR HCT) 40-12.5 MG tablet Take 1 tablet by mouth daily.    . Omega-3 Fatty Acids (OMEGA 3 PO) Take by mouth.     No current facility-administered medications for this visit.      REVIEW OF SYSTEMS: On review of systems, the patient reports that she is doing well overall. She denies any chest pain, shortness of breath, cough, fevers, chills, night sweats, unintended weight  changes. She denies any bowel or bladder disturbances, and denies abdominal pain, nausea or vomiting. She denies any new musculoskeletal or joint aches or pains. A complete review of systems is obtained and is otherwise negative.     PHYSICAL EXAM:  Wt Readings from Last 3 Encounters:  07/24/18 255 lb (115.7 kg)  08/28/17 256 lb (116.1 kg)  07/10/17 256 lb (116.1 kg)   Temp Readings from Last 3 Encounters:  07/24/18 98.3 F (36.8 C) (Oral)  03/28/18 97.9 F (36.6 C) (Oral)  08/28/17 (!) 97.3 F (36.3 C)   BP Readings from Last 3 Encounters:  07/24/18 (!) 146/78  03/28/18 127/81  08/28/17 (!) 142/73   Pulse Readings from Last 3 Encounters:    07/24/18 81  03/28/18 94  08/28/17 86     In general this is a well appearing African American female in no acute distress. She is alert and oriented x4 and appropriate throughout the examination. HEENT reveals that the patient is normocephalic, atraumatic. EOMs are intact. Skin is intact without any evidence of gross lesions. Cardiopulmonary assessment is negative for acute distress and she exhibits normal effort.   ECOG = 0  0 - Asymptomatic (Fully active, able to carry on all predisease activities without restriction)  1 - Symptomatic but completely ambulatory (Restricted in physically strenuous activity but ambulatory and able to carry out work of a light or sedentary nature. For example, light housework, office work)  2 - Symptomatic, <50% in bed during the day (Ambulatory and capable of all self care but unable to carry out any work activities. Up and about more than 50% of waking hours)  3 - Symptomatic, >50% in bed, but not bedbound (Capable of only limited self-care, confined to bed or chair 50% or more of waking hours)  4 - Bedbound (Completely disabled. Cannot carry on any self-care. Totally confined to bed or chair)  5 - Death   Eustace Pen MM, Creech RH, Tormey DC, et al. [phone]). "Toxicity and response criteria of the [facility]". [place] Oncol. 5 (6): 649-55    LABORATORY DATA:  Lab Results  Component Value Date   WBC 5.6 07/24/2018   HGB 12.9 07/24/2018   HCT 39.4 07/24/2018   MCV 89.5 07/24/2018   PLT 143 (L) 07/24/2018   Lab Results  Component Value Date   NA 143 09/29/2015   K 3.2 (L) 09/29/2015   CL 104 09/29/2015   CO2 26 09/29/2015   Lab Results  Component Value Date   ALT 30 09/29/2015   AST 32 09/29/2015   ALKPHOS 68 09/29/2015   BILITOT 0.7 09/29/2015      RADIOGRAPHY: US Breast Ltd Uni Right Inc Axilla  Result Date: 07/12/2018 CLINICAL DATA:  Patient reports persistent pain in the UPPER-OUTER QUADRANT of the RIGHT  breast. Pain comes and goes and has been present since before her last mammogram. EXAM: DIGITAL DIAGNOSTIC BILATERAL MAMMOGRAM WITH CAD AND TOMO ULTRASOUND RIGHT BREAST COMPARISON:  01/29/2017 and earlier ACR Breast Density Category b: There are scattered areas of fibroglandular density. FINDINGS: In the LOWER INNER QUADRANT of the RIGHT breast there is a small round mass with spiculated margins, confirmed on spot compression views. In the UPPER-OUTER QUADRANT of the RIGHT breast in the area of focal tenderness, no abnormality is identified. Normal appearing intramammary and RIGHT axillary lymph nodes are present. LEFT breast is negative. Mammographic images were processed with CAD. On physical exam, I palpate no abnormality in the MEDIAL aspect of  the RIGHT breast. Targeted ultrasound is performed, showing an oval mass with irregular margins in the 3:30 o'clock location of the RIGHT breast 2 centimeters from the nipple. Mass is hypoechoic and associated with needed posterior acoustic enhancement or shadowing. There is no internal blood flow. Mass measures 0.6 x 0.4 x 0.5 centimeters. Evaluation of the UPPER-OUTER QUADRANT of the RIGHT breast in the area of focal tenderness is unremarkable. Evaluation of the RIGHT axilla is negative. IMPRESSION: 1. Suspicious mass in the 3:30 o'clock location of the RIGHT breast. 2. No RIGHT axillary adenopathy. 3. No mammographic or sonographic abnormality in the area of focal tenderness in the UPPER-OUTER QUADRANT of the RIGHT breast. 4. LEFT breast is negative. RECOMMENDATION: Ultrasound-guided core biopsy of mass in the 3 o'clock location of the RIGHT breast. This will be performed later today and dictated separately. I have discussed the findings and recommendations with the patient. Results were also provided in writing at the conclusion of the visit. If applicable, a reminder letter will be sent to the patient regarding the next appointment. BI-RADS CATEGORY  4: Suspicious.  Electronically Signed   By: Nolon Nations M.D.   On: 07/12/2018 11:39   Mm Diag Breast Tomo Bilateral  Result Date: 07/12/2018 CLINICAL DATA:  Patient reports persistent pain in the UPPER-OUTER QUADRANT of the RIGHT breast. Pain comes and goes and has been present since before her last mammogram. EXAM: DIGITAL DIAGNOSTIC BILATERAL MAMMOGRAM WITH CAD AND TOMO ULTRASOUND RIGHT BREAST COMPARISON:  01/29/2017 and earlier ACR Breast Density Category b: There are scattered areas of fibroglandular density. FINDINGS: In the LOWER INNER QUADRANT of the RIGHT breast there is a small round mass with spiculated margins, confirmed on spot compression views. In the UPPER-OUTER QUADRANT of the RIGHT breast in the area of focal tenderness, no abnormality is identified. Normal appearing intramammary and RIGHT axillary lymph nodes are present. LEFT breast is negative. Mammographic images were processed with CAD. On physical exam, I palpate no abnormality in the MEDIAL aspect of the RIGHT breast. Targeted ultrasound is performed, showing an oval mass with irregular margins in the 3:30 o'clock location of the RIGHT breast 2 centimeters from the nipple. Mass is hypoechoic and associated with needed posterior acoustic enhancement or shadowing. There is no internal blood flow. Mass measures 0.6 x 0.4 x 0.5 centimeters. Evaluation of the UPPER-OUTER QUADRANT of the RIGHT breast in the area of focal tenderness is unremarkable. Evaluation of the RIGHT axilla is negative. IMPRESSION: 1. Suspicious mass in the 3:30 o'clock location of the RIGHT breast. 2. No RIGHT axillary adenopathy. 3. No mammographic or sonographic abnormality in the area of focal tenderness in the UPPER-OUTER QUADRANT of the RIGHT breast. 4. LEFT breast is negative. RECOMMENDATION: Ultrasound-guided core biopsy of mass in the 3 o'clock location of the RIGHT breast. This will be performed later today and dictated separately. I have discussed the findings and  recommendations with the patient. Results were also provided in writing at the conclusion of the visit. If applicable, a reminder letter will be sent to the patient regarding the next appointment. BI-RADS CATEGORY  4: Suspicious. Electronically Signed   By: Nolon Nations M.D.   On: 07/12/2018 11:39   Mm Clip Placement Right  Result Date: 07/12/2018 CLINICAL DATA:  Status post ultrasound-guided core biopsy of mass in the 3:30 o'clock location of the RIGHT breast. EXAM: DIAGNOSTIC RIGHT MAMMOGRAM POST ULTRASOUND BIOPSY COMPARISON:  Previous exam(s). FINDINGS: Mammographic images were obtained following ultrasound guided biopsy of mass in the 3:30  o'clock location of the RIGHT breast and placement of a ribbon shaped clip. Clip is identified within the [place], as expected after biopsy. IMPRESSION: Tissue marker clip in the expected location following biopsy. Final Assessment: Post Procedure Mammograms for Marker Placement Electronically Signed   By: Nolon Nations M.D.   On: 07/12/2018 13:42   [name] Rt Breast Bx W Loc Dev 1st Lesion Img Bx Spec US Guide  Addendum Date: 07/22/2018   ADDENDUM REPORT: 07/15/2018 15:15 ADDENDUM: Pathology revealed GRADE III INVASIVE DUCTAL CARCINOMA of the RIGHT breast, lower inner quadrant, 3:30 o'clock, (ribbon clip). This was found to be concordant by Dr. Nolon Nations. Pathology results were discussed with the patient by telephone. The patient reported doing well after the biopsy with tenderness at the site. Post biopsy instructions and care were reviewed and questions were answered. The patient was encouraged to call [facility] for any additional concerns. The patient was referred to [facility] on July 24, 2018. Pathology results reported by Terie Purser, RN on 07/15/2018. Electronically Signed   By: Nolon Nations M.D.   On: 07/15/2018 15:15    Result Date: 07/22/2018 CLINICAL DATA:  Patient presents for ultrasound-guided core biopsy of mass in the RIGHT breast. EXAM: ULTRASOUND GUIDED RIGHT BREAST CORE NEEDLE BIOPSY COMPARISON:  Previous exam(s). FINDINGS: I met with the patient and we discussed the procedure of ultrasound-guided biopsy, including benefits and alternatives. We discussed the high likelihood of a successful procedure. We discussed the risks of the procedure, including infection, bleeding, tissue injury, clip migration, and inadequate sampling. Informed written consent was given. The usual time-out protocol was performed immediately prior to the procedure. Lesion quadrant: LOWER INNER QUADRANT RIGHT breast Using sterile technique and 1% Lidocaine as local anesthetic, under direct ultrasound visualization, a 12 gauge spring-loaded device was used to perform biopsy of mass in the 3:30 o'clock location of the RIGHT breast using a inferior to superior approach. At the conclusion of the procedure a ribbon shaped tissue marker clip was deployed into the biopsy cavity. Follow up 2 view mammogram was performed and dictated separately. IMPRESSION: Ultrasound guided biopsy of RIGHT breast mass. No apparent complications. Electronically Signed: By: Nolon Nations M.D. On: 07/12/2018 13:41       IMPRESSION/PLAN: 1. Stage IB, [id number] grade 3 ER positive invasive ductal carcinoma of the right breast. Dr. Lisbeth Renshaw discusses the pathology findings and reviews the nature of invasive right breast disease. The consensus from the breast conference includes breast conservation with lumpectomy with  sentinel node biopsy. Dr. Lindi Adie anticipates ordering an Oncotype Dx score to determine a role for systemic therapy. Provided that chemotherapy is not indicated, the patient's course would then be followed by external radiotherapy to the breast followed by antiestrogen therapy. We discussed the risks, benefits, short, and long term effects of radiotherapy,  and the patient is interested in proceeding. Dr. Lisbeth Renshaw discusses the delivery and logistics of radiotherapy and anticipates a course of 6 1/2 weeks of radiotherapy. We will see her back about 2 weeks after surgery to discuss the simulation process and anticipate we starting radiotherapy about 4-6 weeks after surgery.   In a visit lasting 45 minutes, greater than 50% of the time was spent face to face discussing her case, and coordinating the patient's care.   The above documentation reflects my direct findings during this shared patient visit. Please see the separate note by Dr. Lisbeth Renshaw on this  date for the remainder of the patient's plan of care.    Carola Rhine, PAC

## 2018-07-24 NOTE — Assessment & Plan Note (Signed)
07/12/2018: Pain right breast UOQ: Negative on mammogram, incidentally found right breast mass LIQ at 3:30 position measuring 0.6 cm biopsy revealed grade 3 IDC ER 40%, PR 0%, HER-2 negative, Ki-67 50%, T1bN0 stage Ib  Pathology and radiology counseling:Discussed with the patient, the details of pathology including the type of breast cancer,the clinical staging, the significance of ER, PR and HER-2/neu receptors and the implications for treatment. After reviewing the pathology in detail, we proceeded to discuss the different treatment options between surgery, radiation, chemotherapy, antiestrogen therapies.  Recommendations: 1. Breast conserving surgery followed by 2. Oncotype DX testing to determine if chemotherapy would be of any benefit followed by 3. Adjuvant radiation therapy followed by 4. Adjuvant antiestrogen therapy  Oncotype counseling: I discussed Oncotype DX test. I explained to the patient that this is a 21 gene panel to evaluate patient tumors DNA to calculate recurrence score. This would help determine whether patient has high risk or intermediate risk or low risk breast cancer. She understands that if her tumor was found to be high risk, she would benefit from systemic chemotherapy. If low risk, no need of chemotherapy. If she was found to be intermediate risk, we would need to evaluate the score as well as other risk factors and determine if an abbreviated chemotherapy may be of benefit.  Return to clinic after surgery to discuss final pathology report and then determine if Oncotype DX testing will need to be sent.

## 2018-07-24 NOTE — H&P (Signed)
[name] [name] Documented: 07/24/2018 7:24 AM Location: [facility] Patient #: [id number] DOB: [birth year] Undefined / Language: [name] [name] / Race: Black or African American Female  History of Present Illness [name] Moores A. Dymond Spreen MD; 07/24/2018 11:52 AM) Patient words: Pt sent at the request of Dr [name] [name] for mammographic abnormality of her right breas and right breast pain for 1 month. The patient has soreness of right breast upper outer quadrant. Mammogram showed a 6 mm mass right lower inner quadrant IDC ER PR POS HER 2 NEU NEGATIVE.  ADDITIONAL INFORMATION: PROGNOSTIC INDICATORS Results: IMMUNOHISTOCHEMICAL AND MORPHOMETRIC ANALYSIS PERFORMED MANUALLY The tumor cells are NEGATIVE for Her2 (0). Estrogen Receptor: 40%, POSITIVE, WEAK STAINING INTENSITY Progesterone Receptor: 0%, NEGATIVE Proliferation Marker Ki67: 50% COMMENT: The negative hormone receptor study(ies) in this case has an internal positive control. REFERENCE RANGE ESTROGEN RECEPTOR NEGATIVE 0% POSITIVE =>1% REFERENCE RANGE PROGESTERONE RECEPTOR NEGATIVE 0% POSITIVE =>1% All controls stained appropriately [name] Cutter MD Pathologist, Electronic Signature ( Signed 07/16/2018) FINAL DIAGNOSIS Diagnosis Breast, right, needle core biopsy, lower inner quadrant, 3:30 o'clock, ribbon clip - INVASIVE DUCTAL CARCINOMA. 1 of 3 FINAL for [name] [name] [phone]) Microscopic Comment The carcinoma appears grade III. A breast prognostic profile will be performed and reported separately. The results were reported to [facility] on 07/15/2018. Intradepartmental consultation was obtained ([name] [name]). [name] Manners MD Pathologist, Electronic Signature (Case signed 07/15/2018) Specimen Gross and Clinical Information Specimen Comment Formalin time; 1:15 PM; extracted < 5 minutes; new mass; Specimen(s) Obtained: Breast, right, needle core biopsy, lower     Patient reports  persistent pain in the UPPER-OUTER QUADRANT of the RIGHT breast. Pain comes and goes and has been present since before her last mammogram.  EXAM: DIGITAL DIAGNOSTIC BILATERAL MAMMOGRAM WITH CAD AND TOMO  ULTRASOUND RIGHT BREAST  COMPARISON: 01/29/2017 and earlier  ACR Breast Density Category b: There are scattered areas of fibroglandular density.  FINDINGS: In the LOWER INNER QUADRANT of the RIGHT breast there is a small round mass with spiculated margins, confirmed on spot compression views. In the UPPER-OUTER QUADRANT of the RIGHT breast in the area of focal tenderness, no abnormality is identified. Normal appearing intramammary and RIGHT axillary lymph nodes are present.  LEFT breast is negative.  Mammographic images were processed with CAD.  On physical exam, I palpate no abnormality in the MEDIAL aspect of the RIGHT breast.  Targeted ultrasound is performed, showing an oval mass with irregular margins in the 3:30 o'clock location of the RIGHT breast 2 centimeters from the nipple. Mass is hypoechoic and associated with needed posterior acoustic enhancement or shadowing. There is no internal blood flow. Mass measures 0.6 x 0.4 x 0.5 centimeters. Evaluation of the UPPER-OUTER QUADRANT of the RIGHT breast in the area of focal tenderness is unremarkable. Evaluation of the RIGHT axilla is negative.  IMPRESSION: 1. Suspicious mass in the 3:30 o'clock location of the RIGHT breast. 2. No RIGHT axillary adenopathy. 3. No mammographic or sonographic abnormality in the area of focal tenderness in the UPPER-OUTER QUADRANT of the RIGHT breast. 4. LEFT breast is negative.  RECOMMENDATION: Ultrasound-guided core biopsy of mass in the 3 o'clock location of the RIGHT breast. This will be performed later today and dictated separately.  I have discussed the findings and recommendations with the patient. Results were also provided in writing at the conclusion of the visit. If  applicable, a reminder letter will be sent to the patient regarding the next appointment.  BI-RADS CATEGORY 4: Suspicious.   Electronically  Signed By: [name] [name] M.D. On: 07/12/2018 11:39.  The patient is a 52 year old female.   Past Surgical History [name] Pummel, RN; 07/24/2018 7:24 AM) Breast Biopsy Right. Cesarean Section - 1 Knee Surgery Right.  Diagnostic Studies History [name] Pummel, RN; 07/24/2018 7:24 AM) Colonoscopy within last year Mammogram within last year Pap Smear 1-5 years ago  Medication History [name] Pummel, RN; 07/24/2018 7:24 AM) Medications Reconciled  Social History [name] Pummel, RN; 07/24/2018 7:24 AM) Caffeine use Coffee. No alcohol use No drug use Tobacco use Never smoker.  Family History [name] Pummel, RN; 07/24/2018 7:24 AM) Heart Disease Father.  Pregnancy / Birth History [name] Pummel, RN; 07/24/2018 7:24 AM) Age at menarche 1 years. Age of menopause 69-50 Contraceptive History Intrauterine device, Oral contraceptives. Gravida 3 Maternal age 33-20 Para 3 Regular periods  Other Problems [name] Pummel, RN; 07/24/2018 7:24 AM) Diverticulosis High blood pressure     Review of Systems ([name] [name] A. Acy Orsak MD; 07/24/2018 11:52 AM) General Not Present- Appetite Loss, Chills, Fatigue, Fever, Night Sweats, Weight Gain and Weight Loss. Skin Not Present- Change in Wart/Mole, Dryness, Hives, Jaundice, New Lesions, Non-Healing Wounds, Rash and Ulcer. HEENT Present- Wears glasses/contact lenses. Not Present- Earache, Hearing Loss, Hoarseness, Nose Bleed, Oral Ulcers, Ringing in the Ears, Seasonal Allergies, Sinus Pain, Sore Throat, Visual Disturbances and Yellow Eyes. Respiratory Not Present- Bloody sputum, Chronic Cough, Difficulty Breathing, Snoring and Wheezing. Breast Not Present- Breast Mass, Breast Pain, Nipple Discharge and Skin Changes. Cardiovascular Not Present- Chest Pain, Difficulty Breathing  Lying Down, Leg Cramps, Palpitations, Rapid Heart Rate, Shortness of Breath and Swelling of Extremities. Gastrointestinal Not Present- Abdominal Pain, Bloating, Bloody Stool, Change in Bowel Habits, Chronic diarrhea, Constipation, Difficulty Swallowing, Excessive gas, Gets full quickly at meals, Hemorrhoids, Indigestion, Nausea, Rectal Pain and Vomiting. Female Genitourinary Not Present- Frequency, Nocturia, Painful Urination, Pelvic Pain and Urgency. Musculoskeletal Not Present- Back Pain, Joint Pain, Joint Stiffness, Muscle Pain, Muscle Weakness and Swelling of Extremities. Neurological Not Present- Decreased Memory, Fainting, Headaches, Numbness, Seizures, Tingling, Tremor, Trouble walking and Weakness. Psychiatric Present- Anxiety. Not Present- Bipolar, Change in Sleep Pattern, Depression, Fearful and Frequent crying. Endocrine Not Present- Cold Intolerance, Excessive Hunger, Hair Changes, Heat Intolerance, Hot flashes and New Diabetes. Hematology Not Present- Blood Thinners, Easy Bruising, Excessive bleeding, Gland problems, HIV and Persistent Infections. All other systems negative   Physical Exam (Lochlyn Zullo A. Kameron Glazebrook MD; 07/24/2018 11:52 AM)  General Mental Status-Alert. General Appearance-Consistent with stated age. Hydration-Well hydrated. Voice-Normal.  Head and Neck Head-normocephalic, atraumatic with no lesions or palpable masses. Trachea-midline. Thyroid Gland Characteristics - normal size and consistency.  Chest and Lung Exam Chest and lung exam reveals -quiet, even and easy respiratory effort with no use of accessory muscles and on auscultation, normal breath sounds, no adventitious sounds and normal vocal resonance. Inspection Chest Wall - Normal. Back - normal.  Breast Breast - Left-Symmetric, Non Tender, No Biopsy scars, no Dimpling, No Inflammation, No Lumpectomy scars, No Mastectomy scars, No Peau d' Orange. Breast - Right-Symmetric, Non Tender, No  Biopsy scars, no Dimpling, No Inflammation, No Lumpectomy scars, No Mastectomy scars, No Peau d' Orange. Breast Lump-No Palpable Breast Mass.  Cardiovascular Cardiovascular examination reveals -normal heart sounds, regular rate and rhythm with no murmurs and normal pedal pulses bilaterally.  Neurologic Neurologic evaluation reveals -alert and oriented x 3 with no impairment of recent or remote memory. Mental Status-Normal.  Musculoskeletal Normal Exam - Left-Upper Extremity Strength Normal and Lower Extremity Strength Normal. Normal Exam - Right-Upper Extremity Strength  Normal and Lower Extremity Strength Normal.  Lymphatic Head & Neck  General Head & Neck Lymphatics: Bilateral - Description - Normal. Axillary  General Axillary Region: Bilateral - Description - Normal. Tenderness - Non Tender.    Assessment & Plan (Sherley Leser A. Lavonna Lampron MD; 07/24/2018 11:53 AM)  BREAST CANCER, RIGHT (C50.911) Impression: Pt has opted for right breast seed lumpectomy and right sentinel lymph node mapping. Risk of lumpectomy include bleeding, infection, seroma, more surgery, use of seed/wire, wound care, cosmetic deformity and the need for other treatments, death , blood clots, death. Pt agrees to proceed. Risk of sentinel lymph node mapping include bleeding, infection, lymphedema, shoulder pain. stiffness, dye allergy. cosmetic deformity , blood clots, death, need for more surgery. Pt agres to proceed.  Current Plans You are being scheduled for surgery- Our schedulers will call you.  You should hear from our office's scheduling department within 5 working days about the location, date, and time of surgery. We try to make accommodations for patient's preferences in scheduling surgery, but sometimes the OR schedule or the surgeon's schedule prevents [name] from making those accommodations.  If you have not heard from our office [phone]) in 5 working days, call the office and ask for your  surgeon's nurse.  If you have other questions about your diagnosis, plan, or surgery, call the office and ask for your surgeon's nurse.  Pt Education - [facility] Breast Cancer Information Given - [facility] "Breast Journey" Package Pt Education - Pamphlet Given - Breast Biopsy: discussed with patient and provided information. We discussed the staging and pathophysiology of breast cancer. We discussed all of the different options for treatment for breast cancer including surgery, chemotherapy, radiation therapy, Herceptin, and antiestrogen therapy. We discussed a sentinel lymph node biopsy as she does not appear to having lymph node involvement right now. We discussed the performance of that with injection of radioactive tracer and blue dye. We discussed that she would have an incision underneath her axillary hairline. We discussed that there is a bout a 10-20% chance of having a positive node with a sentinel lymph node biopsy and we will await the permanent pathology to make any other first further decisions in terms of her treatment. One of these options might be to return to the operating room to perform an axillary lymph node dissection. We discussed about a 1-2% risk lifetime of chronic shoulder pain as well as lymphedema associated with a sentinel lymph node biopsy. We discussed the options for treatment of the breast cancer which included lumpectomy versus a mastectomy. We discussed the performance of the lumpectomy with a wire placement. We discussed a 10-20% chance of a positive margin requiring reexcision in the operating room. We also discussed that she may need radiation therapy or antiestrogen therapy or both if she undergoes lumpectomy. We discussed the mastectomy and the postoperative care for that as well. We discussed that there is no difference in her survival whether she undergoes lumpectomy with radiation therapy or antiestrogen therapy versus a mastectomy. There is a slight difference in the local  recurrence rate being 3-5% with lumpectomy and about 1% with a mastectomy. We discussed the risks of operation including bleeding, infection, possible reoperation. She understands her further therapy will be based on what her stages at the time of her operation.  Pt Education - [facility]b breast cancer surgery: discussed with patient and provided information. Pt Education - [facility] Breast Biopsy HCI: discussed with patient and provided information. Pt Education - ABC (After Breast Cancer) Class Info:  discussed with patient and provided information. Pt Education - [facility] Breast Pains Education

## 2018-07-25 ENCOUNTER — Telehealth: Payer: Self-pay | Admitting: Hematology and Oncology

## 2018-07-25 NOTE — Telephone Encounter (Signed)
No 12/4 los.  [facility] will call patient re scheduling mammo.

## 2018-07-26 ENCOUNTER — Other Ambulatory Visit: Payer: Self-pay | Admitting: Surgery

## 2018-07-26 DIAGNOSIS — C50911 Malignant neoplasm of unspecified site of right female breast: Secondary | ICD-10-CM

## 2018-07-26 DIAGNOSIS — Z170 Estrogen receptor positive status [ER+]: Principal | ICD-10-CM

## 2018-07-29 ENCOUNTER — Telehealth: Payer: Self-pay | Admitting: Hematology and Oncology

## 2018-07-29 NOTE — Telephone Encounter (Signed)
Scheduled appt per 12/6 sch message - pt is aware of apt date and time

## 2018-08-01 ENCOUNTER — Telehealth: Payer: Self-pay | Admitting: *Deleted

## 2018-08-01 NOTE — Telephone Encounter (Signed)
  Oncology Nurse Navigator Documentation  Navigator Location: [facility] (08/01/18 1300)   )Navigator Encounter Type: Telephone;[facility] Follow-up (08/01/18 1300) Telephone: Outgoing Call;Clinic/[facility] Follow-up (08/01/18 1300)     Surgery Date: 08/23/18 (08/01/18 1300)           Treatment Initiated Date: 08/23/18 (08/01/18 1300)                                Time Spent with Patient: 15 (08/01/18 1300)

## 2018-08-07 NOTE — Progress Notes (Signed)
FMLA for daughter, [name] [name], successfully faxed to [employer] at [phone]. Mailed copy to patient address on file.

## 2018-08-15 NOTE — Pre-Procedure Instructions (Addendum)
[name] [name]  08/15/2018      [id number] [address] [place] [address] Phone: [phone] Fax: [phone]    Your procedure is scheduled on 08/23/2017.  Report to [facility] Admitting at 1100 A.M.  Call this number if you have problems the morning of surgery:  [phone]   Remember:  Do not eat after midnight the night before your surgery.  You may drink clear liquids until 10:00am the morning of your surgery .  Clear liquids allowed are: Water, Juice (non-citric and without pulp), Carbonated beverages, Clear Tea, Black Coffee only and Gatorade    Take these medicines the morning of surgery with A SIP OF WATER: Cyclobenzaprine (Flexeril) - if needed Eye drops - if needed  7 days prior to surgery STOP taking any Aspirin (unless otherwise instructed by your surgeon), Aleve, Naproxen, Ibuprofen, Motrin, Advil, Goody's, BC's, all herbal medications, fish oil/Flax Seed oil, and all vitamins.      Do not wear jewelry, make-up or nail polish.  Do not wear lotions, powders, or perfumes, or deodorant.  Do not shave 48 hours prior to surgery.   Do not bring valuables to the hospital.  [facility] is not responsible for any belongings or valuables.  Contacts, eyeglasses, dentures or bridgework may not be worn into surgery.  Leave your suitcase in the car.  After surgery it may be brought to your room.  For patients admitted to the hospital, discharge time will be determined by your treatment team.  Patients discharged the day of surgery will not be allowed to drive home.   Name and phone number of your driver:    Special instructions:   [facility]- Preparing For Surgery  Before surgery, you can play an important role. Because skin is not sterile, your skin needs to be as free of germs as possible. You can reduce the number of germs on your skin  by washing with CHG (chlorahexidine gluconate) Soap before surgery.  CHG is an antiseptic cleaner which kills germs and bonds with the skin to continue killing germs even after washing.    Oral Hygiene is also important to reduce your risk of infection.  Remember - BRUSH YOUR TEETH THE MORNING OF SURGERY WITH YOUR REGULAR TOOTHPASTE  Please do not use if you have an allergy to CHG or antibacterial soaps. If your skin becomes reddened/irritated stop using the CHG.  Do not shave (including legs and underarms) for at least 48 hours prior to first CHG shower. It is OK to shave your face.  Please follow these instructions carefully.   1. Shower the NIGHT BEFORE SURGERY and the MORNING OF SURGERY with CHG.   2. If you chose to wash your hair, wash your hair first as usual with your normal shampoo.  3. After you shampoo, rinse your hair and body thoroughly to remove the shampoo.  4. Use CHG as you would any other liquid soap. You can apply CHG directly to the skin and wash gently with a scrungie or a clean washcloth.   5. Apply the CHG Soap to your body ONLY FROM THE NECK DOWN.  Do not use on open wounds or open sores. Avoid contact with your eyes, ears, mouth and genitals (private parts). Wash Face and genitals (private parts)  with your normal soap.  6. Wash thoroughly, paying special attention to the area  where your surgery will be performed.  7. Thoroughly rinse your body with warm water from the neck down.  8. DO NOT shower/wash with your normal soap after using and rinsing off the CHG Soap.  9. Pat yourself dry with a CLEAN TOWEL.  10. Wear CLEAN PAJAMAS to bed the night before surgery, wear comfortable clothes the morning of surgery  11. Place CLEAN SHEETS on your bed the night of your first shower and DO NOT SLEEP WITH PETS.    Day of Surgery: Shower as stated above. Do not apply any deodorants/lotions.  Please wear clean clothes to the hospital/surgery center.   Remember to  brush your teeth WITH YOUR REGULAR TOOTHPASTE.    Please read over the following fact sheets that you were given.

## 2018-08-16 ENCOUNTER — Encounter (HOSPITAL_COMMUNITY)
Admission: RE | Admit: 2018-08-16 | Discharge: 2018-08-16 | Disposition: A | Discharge status: Home / Self Care | Payer: BLUE CROSS/BLUE SHIELD | Source: Ambulatory Visit | Attending: Surgery | Admitting: Surgery

## 2018-08-16 ENCOUNTER — Encounter (HOSPITAL_COMMUNITY): Payer: Self-pay

## 2018-08-16 ENCOUNTER — Other Ambulatory Visit: Payer: Self-pay

## 2018-08-16 DIAGNOSIS — Z170 Estrogen receptor positive status [ER+]: Secondary | ICD-10-CM

## 2018-08-16 DIAGNOSIS — Z01818 Encounter for other preprocedural examination: Secondary | ICD-10-CM | POA: Insufficient documentation

## 2018-08-16 DIAGNOSIS — C50911 Malignant neoplasm of unspecified site of right female breast: Secondary | ICD-10-CM

## 2018-08-16 LAB — COMPREHENSIVE METABOLIC PANEL
ALT: 20 U/L (ref 0–44)
AST: 19 U/L (ref 15–41)
Albumin: 3.9 g/dL (ref 3.5–5.0)
Alkaline Phosphatase: 64 U/L (ref 38–126)
Anion gap: 9 (ref 5–15)
BUN: 14 mg/dL (ref 6–20)
CO2: 25 mmol/L (ref 22–32)
Calcium: 9.2 mg/dL (ref 8.9–10.3)
Chloride: 107 mmol/L (ref 98–111)
Creatinine, Ser: 0.9 mg/dL (ref 0.44–1.00)
GFR calc Af Amer: >60 mL/min (ref >60)
GFR calc non Af Amer: >60 mL/min (ref >60)
Glucose, Bld: 93 mg/dL (ref 70–99)
Potassium: 3.7 mmol/L (ref 3.5–5.1)
Sodium: 141 mmol/L (ref 135–145)
Total Bilirubin: 0.7 mg/dL (ref 0.3–1.2)
Total Protein: 6.9 g/dL (ref 6.5–8.1)

## 2018-08-16 LAB — CBC WITH DIFFERENTIAL/PLATELET
Abs Immature Granulocytes: 0.01 10*3/uL (ref 0.00–0.07)
Basophils Absolute: 0 10*3/uL (ref 0.0–0.1)
Basophils Relative: 0 %
Eosinophils Absolute: 0.2 10*3/uL (ref 0.0–0.5)
Eosinophils Relative: 3 %
HCT: 38.6 % (ref 36.0–46.0)
Hemoglobin: 12.5 g/dL (ref 12.0–15.0)
Immature Granulocytes: 0 %
Lymphocytes Relative: 33 %
Lymphs Abs: 1.6 10*3/uL (ref 0.7–4.0)
MCH: 29.3 pg (ref 26.0–34.0)
MCHC: 32.4 g/dL (ref 30.0–36.0)
MCV: 90.6 fL (ref 80.0–100.0)
Monocytes Absolute: 0.4 10*3/uL (ref 0.1–1.0)
Monocytes Relative: 7 %
Neutro Abs: 2.8 10*3/uL (ref 1.7–7.7)
Neutrophils Relative %: 57 %
Platelets: 123 10*3/uL — ABNORMAL LOW (ref 150–400)
RBC: 4.26 MIL/uL (ref 3.87–5.11)
RDW: 12.8 % (ref 11.5–15.5)
WBC: 4.9 10*3/uL (ref 4.0–10.5)
nRBC: 0 % (ref 0.0–0.2)

## 2018-08-16 NOTE — Progress Notes (Signed)
PCP - Dr. Lisbeth Ply Cardiologist - patient denies  Chest x-ray - n/a EKG - 08/16/2018 Stress Test - patient denies ECHO - patient denies Cardiac Cath - patient denies  Sleep Study - patient denies CPAP -   Fasting Blood Sugar - n/a Checks Blood Sugar _____ times a day  Blood Thinner Instructions: n/a Aspirin Instructions:n/a  Anesthesia review: n/a  Patient denies shortness of breath, fever, cough and chest pain at PAT appointment   Patient verbalized understanding of instructions that were given to them at the PAT appointment. Patient was also instructed that they will need to review over the PAT instructions again at home before surgery.

## 2018-08-22 ENCOUNTER — Ambulatory Visit
Admission: RE | Admit: 2018-08-22 | Discharge: 2018-08-22 | Disposition: A | Discharge status: Home / Self Care | Payer: BLUE CROSS/BLUE SHIELD | Source: Ambulatory Visit | Attending: Surgery | Admitting: Surgery

## 2018-08-22 DIAGNOSIS — C50911 Malignant neoplasm of unspecified site of right female breast: Secondary | ICD-10-CM

## 2018-08-22 DIAGNOSIS — Z170 Estrogen receptor positive status [ER+]: Principal | ICD-10-CM

## 2018-08-22 HISTORY — PX: BREAST LUMPECTOMY: SHX2

## 2018-08-22 MED ORDER — DEXTROSE 5 % IV SOLN
3.0000 g | INTRAVENOUS | Status: AC
  Administered 2018-08-23: 3 g via INTRAVENOUS
  Filled 2018-08-22: qty 3

## 2018-08-23 ENCOUNTER — Ambulatory Visit
Admission: RE | Admit: 2018-08-23 | Discharge: 2018-08-23 | Disposition: A | Discharge status: Home / Self Care | Payer: BLUE CROSS/BLUE SHIELD | Source: Ambulatory Visit | Attending: Surgery | Admitting: Surgery

## 2018-08-23 ENCOUNTER — Ambulatory Visit (HOSPITAL_COMMUNITY)
Admission: RE | Admit: 2018-08-23 | Discharge: 2018-08-23 | Disposition: A | Discharge status: Home / Self Care | Payer: BLUE CROSS/BLUE SHIELD | Source: Ambulatory Visit | Attending: Surgery | Admitting: Surgery

## 2018-08-23 ENCOUNTER — Ambulatory Visit (HOSPITAL_COMMUNITY): Payer: BLUE CROSS/BLUE SHIELD | Admitting: Certified Registered Nurse Anesthetist

## 2018-08-23 ENCOUNTER — Ambulatory Visit (HOSPITAL_COMMUNITY)
Admission: RE | Admit: 2018-08-23 | Discharge: 2018-08-23 | Disposition: A | Discharge status: Home / Self Care | Payer: BLUE CROSS/BLUE SHIELD | Attending: Surgery | Admitting: Surgery

## 2018-08-23 ENCOUNTER — Encounter (HOSPITAL_COMMUNITY)
Admission: RE | Disposition: A | Discharge status: Home / Self Care | Payer: Self-pay | Source: Home / Self Care | Attending: Surgery

## 2018-08-23 ENCOUNTER — Encounter (HOSPITAL_COMMUNITY): Payer: Self-pay

## 2018-08-23 ENCOUNTER — Other Ambulatory Visit: Payer: Self-pay

## 2018-08-23 DIAGNOSIS — Z79899 Other long term (current) drug therapy: Secondary | ICD-10-CM | POA: Insufficient documentation

## 2018-08-23 DIAGNOSIS — C50911 Malignant neoplasm of unspecified site of right female breast: Secondary | ICD-10-CM

## 2018-08-23 DIAGNOSIS — Z170 Estrogen receptor positive status [ER+]: Secondary | ICD-10-CM | POA: Insufficient documentation

## 2018-08-23 DIAGNOSIS — I10 Essential (primary) hypertension: Secondary | ICD-10-CM | POA: Insufficient documentation

## 2018-08-23 DIAGNOSIS — C50311 Malignant neoplasm of lower-inner quadrant of right female breast: Secondary | ICD-10-CM | POA: Insufficient documentation

## 2018-08-23 HISTORY — PX: BREAST LUMPECTOMY WITH RADIOACTIVE SEED AND SENTINEL LYMPH NODE BIOPSY: SHX6550

## 2018-08-23 SURGERY — BREAST LUMPECTOMY WITH RADIOACTIVE SEED AND SENTINEL LYMPH NODE BIOPSY
Anesthesia: General | Site: Breast | Laterality: Right

## 2018-08-23 MED ORDER — ACETAMINOPHEN 500 MG PO TABS
1000.0000 mg | ORAL_TABLET | ORAL | Status: AC
  Administered 2018-08-23: 1000 mg via ORAL

## 2018-08-23 MED ORDER — METHYLENE BLUE 0.5 % INJ SOLN
INTRAVENOUS | Status: AC
  Filled 2018-08-23: qty 10

## 2018-08-23 MED ORDER — DEXAMETHASONE SODIUM PHOSPHATE 10 MG/ML IJ SOLN
INTRAMUSCULAR | Status: DC | PRN
  Administered 2018-08-23: 10 mg via INTRAVENOUS

## 2018-08-23 MED ORDER — PHENYLEPHRINE 40 MCG/ML (10ML) SYRINGE FOR IV PUSH (FOR BLOOD PRESSURE SUPPORT)
PREFILLED_SYRINGE | INTRAVENOUS | Status: AC
  Filled 2018-08-23: qty 20

## 2018-08-23 MED ORDER — OXYCODONE HCL 5 MG PO TABS
5.0000 mg | ORAL_TABLET | Freq: Once | ORAL | Status: AC | PRN
  Administered 2018-08-23: 5 mg via ORAL

## 2018-08-23 MED ORDER — FENTANYL CITRATE (PF) 250 MCG/5ML IJ SOLN
INTRAMUSCULAR | Status: DC | PRN
  Administered 2018-08-23: 50 ug via INTRAVENOUS

## 2018-08-23 MED ORDER — SODIUM CHLORIDE (PF) 0.9 % IJ SOLN
INTRAMUSCULAR | Status: AC
  Filled 2018-08-23: qty 10

## 2018-08-23 MED ORDER — GABAPENTIN 300 MG PO CAPS
300.0000 mg | ORAL_CAPSULE | ORAL | Status: AC
  Administered 2018-08-23: 300 mg via ORAL

## 2018-08-23 MED ORDER — OXYCODONE HCL 5 MG/5ML PO SOLN: 5.0000 mg | Freq: Once | ORAL | Status: AC | PRN

## 2018-08-23 MED ORDER — MIDAZOLAM HCL 2 MG/2ML IJ SOLN
INTRAMUSCULAR | Status: AC
  Administered 2018-08-23: 2 mg
  Filled 2018-08-23: qty 2

## 2018-08-23 MED ORDER — PROPOFOL 10 MG/ML IV BOLUS
INTRAVENOUS | Status: AC
  Filled 2018-08-23: qty 20

## 2018-08-23 MED ORDER — EPHEDRINE SULFATE-NACL 50-0.9 MG/10ML-% IV SOSY
PREFILLED_SYRINGE | INTRAVENOUS | Status: DC | PRN
  Administered 2018-08-23: 5 mg via INTRAVENOUS

## 2018-08-23 MED ORDER — DEXAMETHASONE SODIUM PHOSPHATE 10 MG/ML IJ SOLN
INTRAMUSCULAR | Status: AC
  Filled 2018-08-23: qty 1

## 2018-08-23 MED ORDER — OXYCODONE HCL 5 MG PO TABS: 5.0000 mg | ORAL_TABLET | Freq: Four times a day (QID) | ORAL | 0 refills | Status: DC | PRN

## 2018-08-23 MED ORDER — LIDOCAINE 2% (20 MG/ML) 5 ML SYRINGE
INTRAMUSCULAR | Status: AC
  Filled 2018-08-23: qty 5

## 2018-08-23 MED ORDER — GABAPENTIN 300 MG PO CAPS: 300.0000 mg | ORAL_CAPSULE | Freq: Two times a day (BID) | ORAL | 1 refills | Status: DC

## 2018-08-23 MED ORDER — FENTANYL CITRATE (PF) 100 MCG/2ML IJ SOLN
25.0000 ug | INTRAMUSCULAR | Status: DC | PRN
  Administered 2018-08-23: 50 ug via INTRAVENOUS

## 2018-08-23 MED ORDER — 0.9 % SODIUM CHLORIDE (POUR BTL) OPTIME
TOPICAL | Status: DC | PRN
  Administered 2018-08-23: 1000 mL

## 2018-08-23 MED ORDER — TECHNETIUM TC 99M SULFUR COLLOID FILTERED
1.0000 | Freq: Once | INTRAVENOUS | Status: AC | PRN
  Administered 2018-08-23: 1 via INTRADERMAL

## 2018-08-23 MED ORDER — BUPIVACAINE-EPINEPHRINE 0.25% -1:200000 IJ SOLN
INTRAMUSCULAR | Status: DC | PRN
  Administered 2018-08-23: 4 mL
  Administered 2018-08-23: 8 mL

## 2018-08-23 MED ORDER — FENTANYL CITRATE (PF) 100 MCG/2ML IJ SOLN
INTRAMUSCULAR | Status: AC
  Administered 2018-08-23: 100 ug
  Filled 2018-08-23: qty 2

## 2018-08-23 MED ORDER — ONDANSETRON HCL 4 MG/2ML IJ SOLN
INTRAMUSCULAR | Status: AC
  Filled 2018-08-23: qty 2

## 2018-08-23 MED ORDER — MIDAZOLAM HCL 2 MG/2ML IJ SOLN: 2.0000 mg | Freq: Once | INTRAMUSCULAR | Status: DC

## 2018-08-23 MED ORDER — CHLORHEXIDINE GLUCONATE CLOTH 2 % EX PADS: 6.0000 | MEDICATED_PAD | Freq: Once | CUTANEOUS | Status: DC

## 2018-08-23 MED ORDER — ONDANSETRON HCL 4 MG/2ML IJ SOLN
INTRAMUSCULAR | Status: DC | PRN
  Administered 2018-08-23: 4 mg via INTRAVENOUS

## 2018-08-23 MED ORDER — BUPIVACAINE-EPINEPHRINE (PF) 0.5% -1:200000 IJ SOLN
INTRAMUSCULAR | Status: DC | PRN
  Administered 2018-08-23: 30 mL via PERINEURAL

## 2018-08-23 MED ORDER — LACTATED RINGERS IV SOLN
INTRAVENOUS | Status: DC
  Administered 2018-08-23: 12:00:00 via INTRAVENOUS

## 2018-08-23 MED ORDER — MIDAZOLAM HCL 2 MG/2ML IJ SOLN
INTRAMUSCULAR | Status: AC
  Filled 2018-08-23: qty 2

## 2018-08-23 MED ORDER — FENTANYL CITRATE (PF) 100 MCG/2ML IJ SOLN: 100.0000 ug | Freq: Once | INTRAMUSCULAR | Status: DC

## 2018-08-23 MED ORDER — FENTANYL CITRATE (PF) 100 MCG/2ML IJ SOLN
INTRAMUSCULAR | Status: AC
  Filled 2018-08-23: qty 2

## 2018-08-23 MED ORDER — SODIUM CHLORIDE (PF) 0.9 % IJ SOLN: INTRAVENOUS | Status: DC | PRN

## 2018-08-23 MED ORDER — PHENYLEPHRINE HCL 10 MG/ML IJ SOLN
INTRAMUSCULAR | Status: DC | PRN
  Administered 2018-08-23: 160 ug via INTRAVENOUS
  Administered 2018-08-23: 40 ug via INTRAVENOUS
  Administered 2018-08-23: 160 ug via INTRAVENOUS
  Administered 2018-08-23: 80 ug via INTRAVENOUS
  Administered 2018-08-23: 120 ug via INTRAVENOUS

## 2018-08-23 MED ORDER — BUPIVACAINE-EPINEPHRINE (PF) 0.25% -1:200000 IJ SOLN
INTRAMUSCULAR | Status: AC
  Filled 2018-08-23: qty 30

## 2018-08-23 MED ORDER — LIDOCAINE 2% (20 MG/ML) 5 ML SYRINGE
INTRAMUSCULAR | Status: DC | PRN
  Administered 2018-08-23: 60 mg via INTRAVENOUS

## 2018-08-23 MED ORDER — EPHEDRINE 5 MG/ML INJ
INTRAVENOUS | Status: AC
  Filled 2018-08-23: qty 10

## 2018-08-23 MED ORDER — OXYCODONE HCL 5 MG PO TABS
ORAL_TABLET | ORAL | Status: AC
  Filled 2018-08-23: qty 1

## 2018-08-23 MED ORDER — PROPOFOL 10 MG/ML IV BOLUS
INTRAVENOUS | Status: DC | PRN
  Administered 2018-08-23: 200 mg via INTRAVENOUS

## 2018-08-23 MED ORDER — ACETAMINOPHEN 500 MG PO TABS
ORAL_TABLET | ORAL | Status: AC
  Administered 2018-08-23: 1000 mg via ORAL
  Filled 2018-08-23: qty 2

## 2018-08-23 MED ORDER — FENTANYL CITRATE (PF) 250 MCG/5ML IJ SOLN
INTRAMUSCULAR | Status: AC
  Filled 2018-08-23: qty 5

## 2018-08-23 MED ORDER — ONDANSETRON HCL 4 MG/2ML IJ SOLN
4.0000 mg | Freq: Four times a day (QID) | INTRAMUSCULAR | Status: AC | PRN
  Administered 2018-08-23: 4 mg via INTRAVENOUS

## 2018-08-23 MED ORDER — GABAPENTIN 300 MG PO CAPS
ORAL_CAPSULE | ORAL | Status: AC
  Administered 2018-08-23: 300 mg via ORAL
  Filled 2018-08-23: qty 1

## 2018-08-23 SURGICAL SUPPLY — 44 items
ADH SKN CLS APL DERMABOND .7 (GAUZE/BANDAGES/DRESSINGS) ×2
APPLIER CLIP 9.375 MED OPEN (MISCELLANEOUS) ×2
APR CLP MED 9.3 20 MLT OPN (MISCELLANEOUS) ×1
BINDER BREAST LRG (GAUZE/BANDAGES/DRESSINGS) IMPLANT
BINDER BREAST XLRG (GAUZE/BANDAGES/DRESSINGS) IMPLANT
BINDER BREAST XXLRG (GAUZE/BANDAGES/DRESSINGS) ×1 IMPLANT
CANISTER SUCT 3000ML PPV (MISCELLANEOUS) ×2 IMPLANT
CHLORAPREP W/TINT 26ML (MISCELLANEOUS) ×2 IMPLANT
CLIP APPLIE 9.375 MED OPEN (MISCELLANEOUS) ×1 IMPLANT
CONT SPEC 4OZ CLIKSEAL STRL BL (MISCELLANEOUS) ×2 IMPLANT
COVER PROBE W GEL 5X96 (DRAPES) ×2 IMPLANT
COVER SURGICAL LIGHT HANDLE (MISCELLANEOUS) ×2 IMPLANT
COVER WAND RF STERILE (DRAPES) IMPLANT
DERMABOND ADVANCED (GAUZE/BANDAGES/DRESSINGS) ×2
DERMABOND ADVANCED .7 DNX12 (GAUZE/BANDAGES/DRESSINGS) ×1 IMPLANT
DEVICE DUBIN SPECIMEN MAMMOGRA (MISCELLANEOUS) ×2 IMPLANT
DRAPE CHEST BREAST 15X10 FENES (DRAPES) ×2 IMPLANT
ELECT REM PT RETURN 9FT ADLT (ELECTROSURGICAL) ×2
ELECTRODE REM PT RTRN 9FT ADLT (ELECTROSURGICAL) ×1 IMPLANT
GLOVE BIO SURGEON STRL SZ8 (GLOVE) ×2 IMPLANT
GLOVE BIOGEL PI IND STRL 8 (GLOVE) ×1 IMPLANT
GLOVE BIOGEL PI INDICATOR 8 (GLOVE) ×1
GOWN STRL REUS W/ TWL LRG LVL3 (GOWN DISPOSABLE) ×1 IMPLANT
GOWN STRL REUS W/ TWL XL LVL3 (GOWN DISPOSABLE) ×1 IMPLANT
GOWN STRL REUS W/TWL LRG LVL3 (GOWN DISPOSABLE) ×2
GOWN STRL REUS W/TWL XL LVL3 (GOWN DISPOSABLE) ×2
KIT BASIN OR (CUSTOM PROCEDURE TRAY) ×2 IMPLANT
KIT MARKER MARGIN INK (KITS) ×2 IMPLANT
LIGHT WAVEGUIDE WIDE FLAT (MISCELLANEOUS) IMPLANT
NDL 18GX1X1/2 (RX/OR ONLY) (NEEDLE) IMPLANT
NDL FILTER BLUNT 18X1 1/2 (NEEDLE) IMPLANT
NDL HYPO 25GX1X1/2 BEV (NEEDLE) ×1 IMPLANT
NEEDLE 18GX1X1/2 (RX/OR ONLY) (NEEDLE) IMPLANT
NEEDLE FILTER BLUNT 18X 1/2SAF (NEEDLE)
NEEDLE FILTER BLUNT 18X1 1/2 (NEEDLE) IMPLANT
NEEDLE HYPO 25GX1X1/2 BEV (NEEDLE) ×2 IMPLANT
NS IRRIG 1000ML POUR BTL (IV SOLUTION) ×2 IMPLANT
PACK GENERAL/GYN (CUSTOM PROCEDURE TRAY) ×2 IMPLANT
PAD ABD 8X10 STRL (GAUZE/BANDAGES/DRESSINGS) ×2 IMPLANT
SUT MNCRL AB 4-0 PS2 18 (SUTURE) ×2 IMPLANT
SUT VIC AB 3-0 SH 18 (SUTURE) ×2 IMPLANT
SYR CONTROL 10ML LL (SYRINGE) ×2 IMPLANT
TOWEL OR 17X24 6PK STRL BLUE (TOWEL DISPOSABLE) ×2 IMPLANT
TOWEL OR 17X26 10 PK STRL BLUE (TOWEL DISPOSABLE) ×2 IMPLANT

## 2018-08-23 NOTE — Interval H&P Note (Signed)
History and Physical Interval Note:  08/23/2018 1:01 PM  [name] [name]  has presented today for surgery, with the diagnosis of RIGHT BREAST CANCER  The various methods of treatment have been discussed with the patient and family. After consideration of risks, benefits and other options for treatment, the patient has consented to  Procedure(s): RIGHT BREAST LUMPECTOMY WITH RADIOACTIVE SEED AND RIGHT SENTINEL LYMPH NODE MAPPING (Right) as a surgical intervention .  The patient's history has been reviewed, patient examined, no change in status, stable for surgery.  I have reviewed the patient's chart and labs.  Questions were answered to the patient's satisfaction.     [place]

## 2018-08-23 NOTE — Anesthesia Procedure Notes (Signed)
Procedure Name: LMA Insertion Date/Time: 08/23/2018 1:13 PM Performed by: Bryson Corona, CRNA Pre-anesthesia Checklist: Patient identified, Emergency Drugs available, Suction available and Patient being monitored Patient Re-evaluated:Patient Re-evaluated prior to induction Oxygen Delivery Method: Circle System Utilized Preoxygenation: Pre-oxygenation with 100% oxygen Induction Type: IV induction Ventilation: Mask ventilation without difficulty LMA: LMA inserted LMA Size: 4.0 Number of attempts: 1 Airway Equipment and Method: Bite block Placement Confirmation: positive ETCO2 Tube secured with: Tape Dental Injury: Teeth and Oropharynx as per pre-operative assessment

## 2018-08-23 NOTE — Op Note (Signed)
Preoperative diagnosis: Stage I right  breast cancer upper-inner quadrant   Postoperative diagnosis: Same   Procedure: right  breast seed localized lumpectomy with right  axillary sentinel lymph node mapping with methylene blue dye   Surgeon: Erroll Luna M.D.   Anesthesia: LMA with pectoral block anesthesia   EBL: 20 cc   Specimen: right  breast mass with clip and seed to pathology and 3 axillary sentinel node hot and blue   Drains: None   Indications for procedure: Patient presents for treatment of her right breast cancer. She has opted for breast conservation after lengthy discussion of treatment options to include breast conservation surgery and mastectomy and reconstruction. Risks, benefits and alternatives discussed with the patient.The procedure has been discussed with the patient. Alternatives to surgery have been discussed with the patient. Risks of surgery include bleeding, Infection, Seroma formation, death, and the need for further surgery. The patient understands and wishes to proceed.Sentinel lymph node mapping and dissection has been discussed with the patient. Risk of bleeding, Infection, Seroma formation, Additional procedures,, Shoulder weakness , Shoulder stiffness, Nerve and blood vessel injury and reaction to the mapping dyes have been discussed. Alternatives to surgery have been discussed with the patient. The patient agrees to proceed.   Description of procedure: Patient underwent placement of right  breast seed in radiology earlier in the week. She presents to the holding area and questions are answered. Neoprobe was used to verify seed  placement in the right  breast. Patient underwent technetium sulfur colloid injection per protocol. Questions answered. Patient taken back to operating room and placed supine on the operating room table. Patient received 2 g of Ancef. After induction of LMA anesthesia right  breast was prepped and draped in a sterile fashion and 4 cc of  methylene blue dye were injected in a subareolar position. Of note, patient had pectoral block by anesthesia prior to this. Neoprobe was used to identify the radioactive seen in the right  upper-inner  quadrant. Curvilinear incision made and dissection was carried around to excise all tissue around both the clip and seed. Radiograph showed the mass with gross negative margins. Both seed  and clip were in the specimen. Specimen sent to pathology.   Neoprobe was switched to the technetium sulfur colloid setting. Hot spot identify the right  axilla. Incision made in the inferior axillary hairline and dissection carried into the axilla.  3 Hot and blue lymph node identified and excised. Background counts approached 0. Wound  was irrigated and closed with 3-0 Vicryl and 4-0 Monocryl. Lumpectomy site closed in a similar fashion. Dermabond applied. All final counts sponge, needle instruments found to be correct at this point. Patient awoke, taken to recovery in satisfactory condition.

## 2018-08-23 NOTE — Transfer of Care (Signed)
Immediate Anesthesia Transfer of Care Note  Patient: [name] [name]  Procedure(s) Performed: RIGHT BREAST LUMPECTOMY WITH RADIOACTIVE SEED AND RIGHT SENTINEL LYMPH NODE MAPPING (Right Breast)  Patient Location: PACU  Anesthesia Type:General  Level of Consciousness: drowsy and patient cooperative  Airway & Oxygen Therapy: Patient Spontanous Breathing and Patient connected to nasal cannula oxygen  Post-op Assessment: Report given to RN and Post -op Vital signs reviewed and stable  Post vital signs: Reviewed and stable  Last Vitals:  Vitals Value Taken Time  BP 133/80 08/23/2018  2:21 PM  Temp 36.7 C 08/23/2018  2:21 PM  Pulse 93 08/23/2018  2:27 PM  Resp 17 08/23/2018  2:27 PM  SpO2 97 % 08/23/2018  2:27 PM  Vitals shown include unvalidated device data.  Last Pain:  Vitals:   08/23/18 1421  TempSrc:   PainSc: 0-No pain      Patients Stated Pain Goal: 2 (46/28/63 8177)  Complications: No apparent anesthesia complications

## 2018-08-23 NOTE — Discharge Instructions (Signed)
[facility] °Office Phone Number [phone] ° °BREAST BIOPSY/ PARTIAL MASTECTOMY: POST OP INSTRUCTIONS ° °Always review your discharge instruction sheet given to you by the facility where your surgery was performed. ° °IF YOU HAVE DISABILITY OR FAMILY LEAVE FORMS, YOU MUST BRING THEM TO THE OFFICE FOR PROCESSING.  DO NOT GIVE THEM TO YOUR DOCTOR. ° °1. A prescription for pain medication may be given to you upon discharge.  Take your pain medication as prescribed, if needed.  If narcotic pain medicine is not needed, then you may take acetaminophen (Tylenol) or ibuprofen (Advil) as needed. °2. Take your usually prescribed medications unless otherwise directed °3. If you need a refill on your pain medication, please contact your pharmacy.  They will contact our office to request authorization.  Prescriptions will not be filled after 5pm or on week-ends. °4. You should eat very light the first 24 hours after surgery, such as soup, crackers, pudding, etc.  Resume your normal diet the day after surgery. °5. Most patients will experience some swelling and bruising in the breast.  Ice packs and a good support bra will help.  Swelling and bruising can take several days to resolve.  °6. It is common to experience some constipation if taking pain medication after surgery.  Increasing fluid intake and taking a stool softener will usually help or prevent this problem from occurring.  A mild laxative (Milk of Magnesia or Miralax) should be taken according to package directions if there are no bowel movements after 48 hours. °7. Unless discharge instructions indicate otherwise, you may remove your bandages 24-48 hours after surgery, and you may shower at that time.  You may have steri-strips (small skin tapes) in place directly over the incision.  These strips should be left on the skin for 7-10 days.  If your surgeon used skin glue on the incision, you may shower in 24 hours.  The glue will flake off over the  next 2-3 weeks.  Any sutures or staples will be removed at the office during your follow-up visit. °8. ACTIVITIES:  You may resume regular daily activities (gradually increasing) beginning the next day.  Wearing a good support bra or sports bra minimizes pain and swelling.  You may have sexual intercourse when it is comfortable. °a. You may drive when you no longer are taking prescription pain medication, you can comfortably wear a seatbelt, and you can safely maneuver your car and apply brakes. °b. RETURN TO WORK:  ______________________________________________________________________________________ °9. You should see your doctor in the office for a follow-up appointment approximately two weeks after your surgery.  Your doctor’s nurse will typically make your follow-up appointment when she calls you with your pathology report.  Expect your pathology report 2-3 business days after your surgery.  You may call to check if you do not hear from us after three days. °10. OTHER INSTRUCTIONS: _______________________________________________________________________________________________ _____________________________________________________________________________________________________________________________________ °_____________________________________________________________________________________________________________________________________ °_____________________________________________________________________________________________________________________________________ ° °WHEN TO CALL YOUR DOCTOR: °1. Fever over 101.0 °2. Nausea and/or vomiting. °3. Extreme swelling or bruising. °4. Continued bleeding from incision. °5. Increased pain, redness, or drainage from the incision. ° °The clinic staff is available to answer your questions during regular business hours.  Please don’t hesitate to call and ask to speak to one of the nurses for clinical concerns.  If you have a medical emergency, go to the nearest  emergency room or call 911.  A surgeon from [facility] is always on call at the hospital. ° °For further questions, please visit [email or web address]  °

## 2018-08-23 NOTE — Anesthesia Preprocedure Evaluation (Signed)
Anesthesia Evaluation  Patient identified by MRN, date of birth, ID band Patient awake    Reviewed: Allergy & Precautions, H&P , NPO status , Patient's Chart, lab work & pertinent test results  Airway Mallampati: II   Neck ROM: full    Dental   Pulmonary neg pulmonary ROS,    breath sounds clear to auscultation       Cardiovascular hypertension,  Rhythm:regular Rate:Normal     Neuro/Psych    GI/Hepatic GERD  ,  Endo/Other    Renal/GU      Musculoskeletal  (+) Arthritis ,   Abdominal   Peds  Hematology   Anesthesia Other Findings   Reproductive/Obstetrics                             Anesthesia Physical Anesthesia Plan  ASA: II  Anesthesia Plan: General   Post-op Pain Management:  Regional for Post-op pain   Induction: Intravenous  PONV Risk Score and Plan: 3 and Ondansetron, Dexamethasone, Midazolam and Treatment may vary due to age or medical condition  Airway Management Planned: LMA  Additional Equipment:   Intra-op Plan:   Post-operative Plan:   Informed Consent: I have reviewed the patients History and Physical, chart, labs and discussed the procedure including the risks, benefits and alternatives for the proposed anesthesia with the patient or authorized representative who has indicated his/her understanding and acceptance.     Plan Discussed with: CRNA, Anesthesiologist and Surgeon  Anesthesia Plan Comments:         Anesthesia Quick Evaluation

## 2018-08-23 NOTE — Anesthesia Procedure Notes (Signed)
Anesthesia Regional Block: Pectoralis block   Pre-Anesthetic Checklist: ,, timeout performed, Correct Patient, Correct Site, Correct Laterality, Correct Procedure, Correct Position, site marked, Risks and benefits discussed,  Surgical consent,  Pre-op evaluation,  At surgeon's request and post-op pain management  Laterality: Right  Prep: chloraprep       Needles:  Injection technique: Single-shot  Needle Type: Echogenic Needle     Needle Length: 9cm  Needle Gauge: 21     Additional Needles:   Narrative:  Start time: 08/23/2018 12:11 PM End time: 08/23/2018 12:22 PM Injection made incrementally with aspirations every 5 mL.  Performed by: Personally  Anesthesiologist: Albertha Ghee, MD  Additional Notes: Pt tolerated the procedure well.

## 2018-08-26 ENCOUNTER — Encounter (HOSPITAL_COMMUNITY): Payer: Self-pay | Admitting: Surgery

## 2018-08-26 NOTE — Anesthesia Postprocedure Evaluation (Signed)
Anesthesia Post Note  Patient: [name] [name]  Procedure(s) Performed: RIGHT BREAST LUMPECTOMY WITH RADIOACTIVE SEED AND RIGHT SENTINEL LYMPH NODE MAPPING (Right Breast)     Patient location during evaluation: PACU Anesthesia Type: General Level of consciousness: awake and alert Pain management: pain level controlled Vital Signs Assessment: post-procedure vital signs reviewed and stable Respiratory status: spontaneous breathing, nonlabored ventilation, respiratory function stable and patient connected to nasal cannula oxygen Cardiovascular status: blood pressure returned to baseline and stable Postop Assessment: no apparent nausea or vomiting Anesthetic complications: no    Last Vitals:  Vitals:   08/23/18 1505 08/23/18 1530  BP: 117/79 134/80  Pulse: 95 88  Resp: 12 16  Temp: 36.7 C   SpO2: 99% 100%    Last Pain:  Vitals:   08/23/18 1505  TempSrc:   PainSc: [place] DAVID

## 2018-08-27 ENCOUNTER — Telehealth: Payer: Self-pay | Admitting: *Deleted

## 2018-08-27 NOTE — Telephone Encounter (Signed)
Received order for oncotype testing. Requisition faxed to pathology. Received by Keisha 

## 2018-08-28 ENCOUNTER — Telehealth: Payer: Self-pay | Admitting: Hematology and Oncology

## 2018-08-28 NOTE — Telephone Encounter (Signed)
Patient called to reschedule, she is still out of town

## 2018-09-02 ENCOUNTER — Ambulatory Visit: Payer: BLUE CROSS/BLUE SHIELD | Admitting: Hematology and Oncology

## 2018-09-04 NOTE — Progress Notes (Signed)
Patient Care Team: [name], [name], MD as PCP - General (Family Medicine) Erroll Luna, MD as Consulting Physician (General Surgery) Nicholas Lose, MD as Consulting Physician (Hematology and Oncology) Kyung Rudd, MD as Consulting Physician (Radiation Oncology)  DIAGNOSIS:    ICD-10-CM   1. Malignant neoplasm of lower-inner quadrant of right breast of female, estrogen receptor positive ([place]) C50.311    Z17.0     SUMMARY OF ONCOLOGIC HISTORY:   Malignant neoplasm of lower-inner quadrant of right breast of female, estrogen receptor positive ([place])   07/16/2018 Initial Diagnosis    Pain right breast UOQ: Negative on mammogram, incidentally found right breast mass LIQ at 3:30 position measuring 0.6 cm biopsy revealed grade 3 IDC ER 40%, PR 0%, HER-2 negative, Ki-67 50%, T1bN0 stage Ib    07/24/2018 Cancer Staging    Staging form: Breast, AJCC 8th Edition - Clinical: Stage IB (cT1b, cN0, cM0, G3, ER+, PR-, HER2-) - Signed by Nicholas Lose, MD on 07/24/2018    08/23/2018 Surgery    Right lumpectomy: IDC, 0.7 cm, grade 3, margins negative, 0/3 lymph nodes negative, ER 40%, PR 0%, HER-2 negative, Ki-67 50%, T1BN0 stage Ia    09/04/2018 Cancer Staging    Staging form: Breast, AJCC 8th Edition - Pathologic: Stage IA (pT1b, pN0, cM0, G3, ER+, PR-, HER2-) - Signed by Gardenia Phlegm, NP on 09/04/2018     CHIEF COMPLIANT: Follow-up s/p right lumpectomy to review Oncotype results and discuss chemotherapy  INTERVAL HISTORY: [name] [name] is a 53 y.o. with above-mentioned history of right breast cancer. She had a lumpectomy of the right breast on 08/23/18 with Dr. Brantley Stage for which pathology showed the 0.7cm tumor to be stage 1a, grade 3, HER2 negative, ER 40%, PR 0%, Ki67 50% with no lymph node involvement. Oncotype testing showed a score of 45 with 33% chance of recurrence in 9 years. She presents to the clinic today alone but with her son on the phone. She is very upset about  needing chemotherapy, as she did not think it would be necessary because the surgeon told her no lymph nodes were involved. She is worried about getting time off from work for treatment.   REVIEW OF SYSTEMS:   Constitutional: Denies fevers, chills or abnormal weight loss Eyes: Denies blurriness of vision Ears, nose, mouth, throat, and face: Denies mucositis or sore throat Respiratory: Denies cough, dyspnea or wheezes Cardiovascular: Denies palpitation, chest discomfort Gastrointestinal:  Denies nausea, heartburn or change in bowel habits Skin: Denies abnormal skin rashes Lymphatics: Denies new lymphadenopathy or easy bruising Neurological: Denies numbness, tingling or new weaknesses Behavioral/Psych: Mood is stable, no new changes  Extremities: No lower extremity edema Breast: Recent Lumpectomy All other systems were reviewed with the patient and are negative.  I have reviewed the past medical history, past surgical history, social history and family history with the patient and they are unchanged from previous note.  ALLERGIES:  is allergic to ibuprofen; lisinopril; and dilaudid [hydromorphone hcl].  MEDICATIONS:  Current Outpatient Medications  Medication Sig Dispense Refill  . Cholecalciferol (VITAMIN D3) 5000 UNITS TABS Take 1 tablet by mouth daily.    . clobetasol ointment (TEMOVATE) 7.51 % Apply 1 application topically 2 (two) times daily. Then as needed for 30 days  1  . cyclobenzaprine (FLEXERIL) 10 MG tablet Take 10 mg by mouth 3 (three) times daily as needed for muscle spasms.    . Flaxseed, Linseed, (FLAX SEED OIL PO) Take 5 mLs by mouth every morning.    Marland Kitchen  gabapentin (NEURONTIN) 300 MG capsule Take 1 capsule (300 mg total) by mouth 2 (two) times daily. 20 capsule 1  . naproxen (NAPROSYN) 500 MG tablet Take 1 tablet (500 mg total) by mouth 2 (two) times daily. (Patient not taking: Reported on 07/24/2018) 30 tablet 0  . naproxen sodium (ALEVE) 220 MG tablet Take 660 mg by mouth  daily as needed (pain).    . Olmesartan-amLODIPine-HCTZ 40-10-25 MG TABS Take 1 tablet by mouth daily.    Marland Kitchen oxyCODONE (OXY IR/ROXICODONE) 5 MG immediate release tablet Take 1 tablet (5 mg total) by mouth every 6 (six) hours as needed for severe pain. 10 tablet 0  . tetrahydrozoline-zinc (VISINE-AC) 0.05-0.25 % ophthalmic solution Place 2 drops into both eyes as needed (dry, itchy eyes).     No current facility-administered medications for this visit.     PHYSICAL EXAMINATION: ECOG PERFORMANCE STATUS: 1  Vitals:   09/05/18 0932  BP: 131/75  Pulse: 81  Resp: 18  Temp: 98.7 F (37.1 C)  SpO2: 99%   Filed Weights   09/05/18 0932  Weight: 255 lb 11.2 oz (116 kg)    GENERAL: alert, no distress and comfortable SKIN: skin color, texture, turgor are normal, no rashes or significant lesions EYES: normal, Conjunctiva are pink and non-injected, sclera clear OROPHARYNX: no exudate, no erythema and lips, buccal mucosa, and tongue normal  NECK: supple, thyroid normal size, non-tender, without nodularity LYMPH: no palpable lymphadenopathy in the cervical, axillary or inguinal LUNGS: clear to auscultation and percussion with normal breathing effort HEART: regular rate & rhythm and no murmurs and no lower extremity edema ABDOMEN: abdomen soft, non-tender and normal bowel sounds MUSCULOSKELETAL: no cyanosis of digits and no clubbing  NEURO: alert & oriented x 3 with fluent speech, no focal motor/sensory deficits EXTREMITIES: No lower extremity edema  LABORATORY DATA:  I have reviewed the data as listed CMP Latest Ref Rng & Units 08/16/2018 07/24/2018 09/29/2015  Glucose 70 - 99 mg/dL 93 100(H) 127(H)  BUN 6 - 20 mg/dL '14 13 8  ' Creatinine 0.44 - 1.00 mg/dL 0.90 0.99 0.89  Sodium 135 - 145 mmol/L 141 142 143  Potassium 3.5 - 5.1 mmol/L 3.7 3.8 3.2(L)  Chloride 98 - 111 mmol/L 107 104 104  CO2 22 - 32 mmol/L '25 29 26  ' Calcium 8.9 - 10.3 mg/dL 9.2 9.9 9.5  Total Protein 6.5 - 8.1 g/dL 6.9 7.8  7.4  Total Bilirubin 0.3 - 1.2 mg/dL 0.7 0.5 0.7  Alkaline Phos 38 - 126 U/L 64 85 68  AST 15 - 41 U/L 19 19 32  ALT 0 - 44 U/L '20 14 30    ' Lab Results  Component Value Date   WBC 4.9 08/16/2018   HGB 12.5 08/16/2018   HCT 38.6 08/16/2018   MCV 90.6 08/16/2018   PLT 123 (L) 08/16/2018   NEUTROABS 2.8 08/16/2018    ASSESSMENT & PLAN:  Malignant neoplasm of lower-inner quadrant of right breast of female, estrogen receptor positive ([facility]) 08/23/2018:Right lumpectomy: IDC, 0.7 cm, grade 3, margins negative, 0/3 lymph nodes negative, ER 40%, PR 0%, HER-2 negative, Ki-67 50%, T1BN0 stage Ia  Pathology counseling: I discussed the final pathology report of the patient provided  a copy of this report. I discussed the margins as well as lymph node surgeries. We also discussed the final staging along with previously performed ER/PR and HER-2/neu testing.  Oncotype Dx 44: High Risk  Treatment plan: 1. Adj Chemo with Dose dense adriamycin and Cytoxan foll by  Taxol weekly X 12 2. Adjuvant radiation therapy followed by 3. Adjuvant antiestrogen therapy  Chemotherapy Counseling: I discussed the risks and benefits of chemotherapy including the risks of nausea/ vomiting, risk of infection from low WBC count, fatigue due to chemo or anemia, bruising or bleeding due to low platelets, mouth sores, loss/ change in taste and decreased appetite. Liver and kidney function will be monitored through out chemotherapy as abnormalities in liver and kidney function may be a side effect of treatment. Cardiac dysfunction due to Adriamycin was discussed in detail. Risk of permanent bone marrow dysfunction and leukemia due to chemo were also discussed.   Return to clinic to start chemo   No orders of the defined types were placed in this encounter.  The patient has a good understanding of the overall plan. she agrees with it. she will call with any problems that may develop before the next visit here.  Nicholas Lose, MD 09/05/2018  Julious Oka Dorshimer am acting as scribe for Dr. Nicholas Lose.  I have reviewed the above documentation for accuracy and completeness, and I agree with the above.

## 2018-09-05 ENCOUNTER — Encounter: Payer: Self-pay | Admitting: General Practice

## 2018-09-05 ENCOUNTER — Inpatient Hospital Stay: Payer: BLUE CROSS/BLUE SHIELD | Attending: Hematology and Oncology | Admitting: Hematology and Oncology

## 2018-09-05 ENCOUNTER — Other Ambulatory Visit: Payer: Self-pay

## 2018-09-05 VITALS — BP 131/75 | HR 81 | Temp 98.7°F | Resp 18 | Ht 66.0 in | Wt 255.7 lb

## 2018-09-05 DIAGNOSIS — Z170 Estrogen receptor positive status [ER+]: Secondary | ICD-10-CM | POA: Diagnosis not present

## 2018-09-05 DIAGNOSIS — Z79899 Other long term (current) drug therapy: Secondary | ICD-10-CM

## 2018-09-05 DIAGNOSIS — C50311 Malignant neoplasm of lower-inner quadrant of right female breast: Secondary | ICD-10-CM | POA: Insufficient documentation

## 2018-09-05 DIAGNOSIS — Z136 Encounter for screening for cardiovascular disorders: Secondary | ICD-10-CM

## 2018-09-05 NOTE — Progress Notes (Signed)
[facility] Spiritual Care Note  Referred by Dr Lindi Adie to provide emotional support as Katori was tearful at the unexpected news of needing chemo. 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 She is worried about how tx will affect her ability to earn an income.   Normalized feelings, encouraged self-care and time with her daughter (who was en route and then joined [name]) as her next steps in processing this change, and let her know that [employer] can be sources of information about insurance coverage and eligibility for other resources when she is ready for consultation. Brought prayer shawl as tangible sign of care and support. Provided my brochure with encouragement to reach out anytime.  Plan to f/u when [place] is on campus, but please also page as needs arise or circumstances change. Thank you.   [place], [place], [facility] Pager [phone] Voicemail [phone]

## 2018-09-05 NOTE — Assessment & Plan Note (Signed)
08/23/2018:Right lumpectomy: IDC, 0.7 cm, grade 3, margins negative, 0/3 lymph nodes negative, ER 40%, PR 0%, HER-2 negative, Ki-67 50%, T1BN0 stage Ia  Pathology counseling: I discussed the final pathology report of the patient provided  a copy of this report. I discussed the margins as well as lymph node surgeries. We also discussed the final staging along with previously performed ER/PR and HER-2/neu testing.  Treatment plan: 1. Oncotype DX testing to determine if chemotherapy would be of any benefit followed by 2. Adjuvant radiation therapy followed by 3. Adjuvant antiestrogen therapy  Return to clinic based upon Oncotype DX test result

## 2018-09-06 ENCOUNTER — Telehealth: Payer: Self-pay | Admitting: Hematology and Oncology

## 2018-09-06 ENCOUNTER — Other Ambulatory Visit: Payer: Self-pay | Admitting: *Deleted

## 2018-09-06 NOTE — Telephone Encounter (Signed)
Gave avs and caelndar

## 2018-09-09 ENCOUNTER — Telehealth: Payer: Self-pay | Admitting: Hematology and Oncology

## 2018-09-09 ENCOUNTER — Encounter (HOSPITAL_COMMUNITY): Payer: Self-pay | Admitting: Hematology and Oncology

## 2018-09-09 NOTE — Telephone Encounter (Signed)
R/s appt per 1/17 sch message - pt is aware of appt date and time

## 2018-09-11 ENCOUNTER — Ambulatory Visit: Payer: Self-pay | Admitting: Surgery

## 2018-09-11 NOTE — H&P (Signed)
[name] [name] Documented: 09/11/2018 3:55 PM Location: [facility] Patient #: [id number] DOB: 06/19/1966 Undefined / Language: [name] [name] / Race: Black or African American Female  History of Present Illness [name] A. Makia Bossi MD; 09/11/2018 4:26 PM) Patient words: Patient returns after right breast lumpectomy for stage I breast cancer. Unfortunately her score was 43 therefore she will need chemotherapy. She is doing well otherwise. I discussed port placement and the rationale for doing so and the rationale for postoperative chemotherapy in her case.      1. PROGNOSTIC INDICATORS Results: IMMUNOHISTOCHEMICAL AND MORPHOMETRIC ANALYSIS PERFORMED MANUALLY Estrogen Receptor: 0%, NEGATIVE COMMENT: The negative hormone receptor study(ies) in this case has An internal positive control. REFERENCE RANGE ESTROGEN RECEPTOR NEGATIVE 0% POSITIVE =>1% REFERENCE RANGE PROGESTERONE RECEPTOR NEGATIVE 0% POSITIVE =>1% All controls stained appropriately [name] Cutter MD Pathologist, Electronic Signature ( Signed 09/06/2018) FINAL DIAGNOSIS Diagnosis 1. Breast, lumpectomy, Right with seed - INVASIVE DUCTAL CARCINOMA, 0.7 CM, NOTTINGHAM GRADE 3 OF 3. - MARGINS OF RESECTION ARE NOT INVOLVED (CLOSEST MARGIN: 3 MM, MEDIAL). - BIOPSY SITE CHANGES. - SEE ONCOLOGY TABLE. 2. Breast, excision, Additional medial 1 of 4 FINAL for [name], Krisann [name] ([id number]) Diagnosis(continued) - FIBROFATTY BREAST TISSUE, NEGATIVE FOR CARCINOMA. 3. Lymph node, sentinel, biopsy, Right sentinel - ONE LYMPH NODE, NEGATIVE FOR CARCINOMA (0/1). 4. Lymph node, sentinel, biopsy, SLN Right - ONE LYMPH NODE, NEGATIVE FOR CARCINOMA (0/1). 5. Lymph node, sentinel, biopsy, SLN Right - ONE LYMPH NODE, NEGATIVE FOR CARCINOMA (0/1). Microscopic Comment 1. INVASIVE CARCINOMA OF THE BREAST: Resection Procedure: Lumpectomy Specimen Laterality: Right Tumor Size: 0.7 cm Histologic Type: Invasive ductal  carcinoma Histologic Grade: Glandular (Acinar)/Tubular Differentiation: 3 Nuclear Pleomorphism: 3 Mitotic Rate: 2 Overall Grade: 3 Ductal Carcinoma In Situ: Not identified Tumor Extension: Confined to breast parenchyma Margins: Distance from closest margin (millimeters): 3 mm Specify closest margin (required only if <80m): Medial Regional Lymph Nodes: Number of Lymph Nodes Examined: 3 Number of Sentinel Nodes Examined (if applicable): 3 Number of Lymph Nodes with Macrometastases (>2 mm): 0 Number of Lymph Nodes with Micrometastases: 0 Number of Lymph Nodes with Isolated Tumor Cells (?0.2 mm or ?200 cells): 0 Size of Largest Metastatic Deposit (millimeters): N/A Extranodal Extension: N/A Treatment Effect: No known presurgical therapy Breast Biomarker Testing Performed on Previous Biopsy: Yes Testing Performed on Case Number: [id number] Receptor: 40%, positive, weak Progesterone Receptor: 0%, negative HER2: Negative (0) Ki-67: 50% Representative tumor block: 1B-C Pathologic Stage Classification (pTNM, AJCC 8th Edition): pT1b, pN0 Comment: Tumor is present 3 mm from the closest margin, medial, in the lumpectomy specimen. The separately submitted additional medial margin is negative for tumor (1.1 cm in thickness). (v4.2.0.0) 2 of 4 FINAL for CTARINI, [name] [name](([id number] AGillie MannersMD Pathologist, Electronic Signature (Case signed 08/26/2018) Specimen Gross and Clinical Information Specimen(s) Obtained: 1. Breast, lumpectomy, Right with seed 2. Breast, excision, Additional medial 3. Lymph node, sentinel, biopsy, Right sentinel 4. Lymph node, sentinel, biopsy, SLN Right 5. Lymph node, sentinel, biopsy, SLN Right Specimen Clinical Information 1. Right breast cancer (ag) Gross 1.  The patient is a 53year old female.   Allergies (Sabrina Canty, CMA; 09/11/2018 3:56 PM) Ibuprofen *ANALGESICS - ANTI-INFLAMMATORY* Swelling. Lisinopril  *ANTIHYPERTENSIVES* Swelling. Allergies Reconciled  Medication History (Nance Pew CMA; 09/11/2018 3:56 PM) Clobetasol Propionate (0.05% Ointment, External) Active. Naproxen (500MG Tablet, Oral) Active. Vitamin D-3 (5000UNIT Tablet, Oral) Active. Multiple Vitamin (1 (one) Oral) Active. Omega 3 (1000MG Capsule, Oral) Active. Benicar HCT (40-12.5MG Tablet, Oral) Active. Medications Reconciled  Vitals (Sabrina Canty CMA; 09/11/2018 3:57 PM) 09/11/2018 3:57 PM Weight: 252.13 lb Height: 66in Body Surface Area: 2.21 m Body Mass Index: 40.69 kg/m  Temp.: 97.94F(Temporal)  Pulse: 97 (Regular)  P.OX: 97% (Room air) BP: 168/98 (Sitting, Right Arm, Standard)      Physical Exam (Ambreen Tufte A. Hanson Medeiros MD; 09/11/2018 4:26 PM)  Breast Note: Right breast incision clean dry and intact.    Assessment & Plan (Fardowsa Authier A. Shoshannah Faubert MD; 09/11/2018 4:25 PM)  POOR VENOUS ACCESS (I87.8) Impression: High oncotype score therefore requires postoperative chemotherapy.  Pt requires port placement for chemotherapy. Risk include bleeding, infection, pneumothorax, hemothorax, mediastinal injury, nerve injury , blood vessel injury, strke, blood clots, death, migration. embolization and need for additional procedures. Pt agrees to proceed.  Current Plans Pt Education - [facility] Portacath HCI Use of a central venous catheter for intravenous therapy was discussed. Technique of catheter placement using ultrasound and fluoroscopy guidance was discussed. Risks such as bleeding, infection, pneumothorax, catheter occlusion, reoperation, and other risks were discussed. I noted a good likelihood this will help address the problem. Questions were answered. The patient expressed understanding & wishes to proceed.

## 2018-09-11 NOTE — H&P (View-Only) (Signed)
[name] [name] Documented: 09/11/2018 3:55 PM Location: [facility] Patient #: [id number] DOB: 06/14/1966 Undefined / Language: English / Race: Black or African American Female  History of Present Illness ([name] [name] A. Candy Ziegler MD; 09/11/2018 4:26 PM) Patient words: Patient returns after right breast lumpectomy for stage I breast cancer. Unfortunately her score was 43 therefore she will need chemotherapy. She is doing well otherwise. I discussed port placement and the rationale for doing so and the rationale for postoperative chemotherapy in her case.      1. PROGNOSTIC INDICATORS Results: IMMUNOHISTOCHEMICAL AND MORPHOMETRIC ANALYSIS PERFORMED MANUALLY Estrogen Receptor: 0%, NEGATIVE COMMENT: The negative hormone receptor study(ies) in this case has An internal positive control. REFERENCE RANGE ESTROGEN RECEPTOR NEGATIVE 0% POSITIVE =>1% REFERENCE RANGE PROGESTERONE RECEPTOR NEGATIVE 0% POSITIVE =>1% All controls stained appropriately JOSHUA KISH MD Pathologist, Electronic Signature ( Signed 09/06/2018) FINAL DIAGNOSIS Diagnosis 1. Breast, lumpectomy, Right with seed - INVASIVE DUCTAL CARCINOMA, 0.7 CM, NOTTINGHAM GRADE 3 OF 3. - MARGINS OF RESECTION ARE NOT INVOLVED (CLOSEST MARGIN: 3 MM, MEDIAL). - BIOPSY SITE CHANGES. - SEE ONCOLOGY TABLE. 2. Breast, excision, Additional medial 1 of 4 FINAL for Fiveash, [name] [name] ([id number]) Diagnosis(continued) - FIBROFATTY BREAST TISSUE, NEGATIVE FOR CARCINOMA. 3. Lymph node, sentinel, biopsy, Right sentinel - ONE LYMPH NODE, NEGATIVE FOR CARCINOMA (0/1). 4. Lymph node, sentinel, biopsy, SLN Right - ONE LYMPH NODE, NEGATIVE FOR CARCINOMA (0/1). 5. Lymph node, sentinel, biopsy, SLN Right - ONE LYMPH NODE, NEGATIVE FOR CARCINOMA (0/1). Microscopic Comment 1. INVASIVE CARCINOMA OF THE BREAST: Resection Procedure: Lumpectomy Specimen Laterality: Right Tumor Size: 0.7 cm Histologic Type: Invasive ductal  carcinoma Histologic Grade: Glandular (Acinar)/Tubular Differentiation: 3 Nuclear Pleomorphism: 3 Mitotic Rate: 2 Overall Grade: 3 Ductal Carcinoma In Situ: Not identified Tumor Extension: Confined to breast parenchyma Margins: Distance from closest margin (millimeters): 3 mm Specify closest margin (required only if <10mm): Medial Regional Lymph Nodes: Number of Lymph Nodes Examined: 3 Number of Sentinel Nodes Examined (if applicable): 3 Number of Lymph Nodes with Macrometastases (>2 mm): 0 Number of Lymph Nodes with Micrometastases: 0 Number of Lymph Nodes with Isolated Tumor Cells (?0.2 mm or ?200 cells): 0 Size of Largest Metastatic Deposit (millimeters): N/A Extranodal Extension: N/A Treatment Effect: No known presurgical therapy Breast Biomarker Testing Performed on Previous Biopsy: Yes Testing Performed on Case Number: [id number] Estrogen Receptor: 40%, positive, weak Progesterone Receptor: 0%, negative HER2: Negative (0) Ki-67: 50% Representative tumor block: 1B-C Pathologic Stage Classification (pTNM, AJCC 8th Edition): pT1b, pN0 Comment: Tumor is present 3 mm from the closest margin, medial, in the lumpectomy specimen. The separately submitted additional medial margin is negative for tumor (1.1 cm in thickness). (v4.2.0.0) 2 of 4 FINAL for Gelinas, [name] [name] ([id number]) [name] Canacci MD Pathologist, Electronic Signature (Case signed 08/26/2018) Specimen Gross and Clinical Information Specimen(s) Obtained: 1. Breast, lumpectomy, Right with seed 2. Breast, excision, Additional medial 3. Lymph node, sentinel, biopsy, Right sentinel 4. Lymph node, sentinel, biopsy, SLN Right 5. Lymph node, sentinel, biopsy, SLN Right Specimen Clinical Information 1. Right breast cancer (ag) Gross 1.  The patient is a 53 year old female.   Allergies (Sabrina Canty, CMA; 09/11/2018 3:56 PM) Ibuprofen *ANALGESICS - ANTI-INFLAMMATORY* Swelling. Lisinopril  *ANTIHYPERTENSIVES* Swelling. Allergies Reconciled  Medication History (Sabrina Canty, CMA; 09/11/2018 3:56 PM) Clobetasol Propionate (0.05% Ointment, External) Active. Naproxen (500MG Tablet, Oral) Active. Vitamin D-3 (5000UNIT Tablet, Oral) Active. Multiple Vitamin (1 (one) Oral) Active. Omega 3 (1000MG Capsule, Oral) Active. Benicar HCT (40-12.5MG Tablet, Oral) Active. Medications Reconciled      Vitals (Sabrina Canty CMA; 09/11/2018 3:57 PM) 09/11/2018 3:57 PM Weight: 252.13 lb Height: 66in Body Surface Area: 2.21 m Body Mass Index: 40.69 kg/m  Temp.: 97.8F(Temporal)  Pulse: 97 (Regular)  P.OX: 97% (Room air) BP: 168/98 (Sitting, Right Arm, Standard)      Physical Exam (Mahnoor Mathisen A. Pryce Folts MD; 09/11/2018 4:26 PM)  Breast Note: Right breast incision clean dry and intact.    Assessment & Plan (Dior Dominik A. Chrishonda Hesch MD; 09/11/2018 4:25 PM)  POOR VENOUS ACCESS (I87.8) Impression: High oncotype score therefore requires postoperative chemotherapy.  Pt requires port placement for chemotherapy. Risk include bleeding, infection, pneumothorax, hemothorax, mediastinal injury, nerve injury , blood vessel injury, strke, blood clots, death, migration. embolization and need for additional procedures. Pt agrees to proceed.  Current Plans Pt Education - CCS Portacath HCI Use of a central venous catheter for intravenous therapy was discussed. Technique of catheter placement using ultrasound and fluoroscopy guidance was discussed. Risks such as bleeding, infection, pneumothorax, catheter occlusion, reoperation, and other risks were discussed. I noted a good likelihood this will help address the problem. Questions were answered. The patient expressed understanding & wishes to proceed. 

## 2018-09-13 ENCOUNTER — Ambulatory Visit (HOSPITAL_COMMUNITY)
Admission: RE | Admit: 2018-09-13 | Discharge: 2018-09-13 | Disposition: A | Discharge status: Home / Self Care | Payer: BLUE CROSS/BLUE SHIELD | Source: Ambulatory Visit | Attending: Hematology and Oncology | Admitting: Hematology and Oncology

## 2018-09-13 DIAGNOSIS — K219 Gastro-esophageal reflux disease without esophagitis: Secondary | ICD-10-CM | POA: Insufficient documentation

## 2018-09-13 DIAGNOSIS — I10 Essential (primary) hypertension: Secondary | ICD-10-CM | POA: Insufficient documentation

## 2018-09-13 DIAGNOSIS — Z136 Encounter for screening for cardiovascular disorders: Secondary | ICD-10-CM

## 2018-09-13 NOTE — Progress Notes (Signed)
  Echocardiogram 2D Echocardiogram has been performed.  [name] [name] M 09/13/2018, 10:46 AM

## 2018-09-17 ENCOUNTER — Other Ambulatory Visit: Payer: Self-pay

## 2018-09-17 ENCOUNTER — Encounter (HOSPITAL_BASED_OUTPATIENT_CLINIC_OR_DEPARTMENT_OTHER): Payer: Self-pay | Admitting: *Deleted

## 2018-09-18 ENCOUNTER — Inpatient Hospital Stay: Payer: BLUE CROSS/BLUE SHIELD

## 2018-09-18 ENCOUNTER — Encounter (HOSPITAL_BASED_OUTPATIENT_CLINIC_OR_DEPARTMENT_OTHER)
Admission: RE | Admit: 2018-09-18 | Discharge: 2018-09-18 | Disposition: A | Discharge status: Home / Self Care | Payer: BLUE CROSS/BLUE SHIELD | Source: Ambulatory Visit | Attending: Surgery | Admitting: Surgery

## 2018-09-18 DIAGNOSIS — Z01812 Encounter for preprocedural laboratory examination: Secondary | ICD-10-CM | POA: Insufficient documentation

## 2018-09-18 LAB — BASIC METABOLIC PANEL
Anion gap: 7 (ref 5–15)
BUN: 9 mg/dL (ref 6–20)
CO2: 29 mmol/L (ref 22–32)
Calcium: 9.3 mg/dL (ref 8.9–10.3)
Chloride: 103 mmol/L (ref 98–111)
Creatinine, Ser: 0.78 mg/dL (ref 0.44–1.00)
GFR calc Af Amer: >60 mL/min (ref >60)
GFR calc non Af Amer: >60 mL/min (ref >60)
Glucose, Bld: 99 mg/dL (ref 70–99)
Potassium: 3.8 mmol/L (ref 3.5–5.1)
Sodium: 139 mmol/L (ref 135–145)

## 2018-09-18 NOTE — Progress Notes (Signed)
Ensure pre surgery drink given with instructions to complete by 0515 dos, surgical soap given with instructions, pt verbalized understanding. 

## 2018-09-19 ENCOUNTER — Other Ambulatory Visit: Payer: BLUE CROSS/BLUE SHIELD

## 2018-09-19 ENCOUNTER — Other Ambulatory Visit: Payer: Self-pay | Admitting: Hematology and Oncology

## 2018-09-19 ENCOUNTER — Encounter: Payer: Self-pay | Admitting: Hematology and Oncology

## 2018-09-19 MED ORDER — DEXAMETHASONE 4 MG PO TABS: ORAL_TABLET | ORAL | 0 refills | Status: DC

## 2018-09-19 MED ORDER — LORAZEPAM 0.5 MG PO TABS: 0.5000 mg | ORAL_TABLET | Freq: Every evening | ORAL | 0 refills | Status: DC | PRN

## 2018-09-19 MED ORDER — ONDANSETRON HCL 8 MG PO TABS: 8.0000 mg | ORAL_TABLET | Freq: Two times a day (BID) | ORAL | 1 refills | Status: DC | PRN

## 2018-09-19 MED ORDER — LIDOCAINE-PRILOCAINE 2.5-2.5 % EX CREA: TOPICAL_CREAM | CUTANEOUS | 3 refills | Status: DC

## 2018-09-19 MED ORDER — PROCHLORPERAZINE MALEATE 10 MG PO TABS: 10.0000 mg | ORAL_TABLET | Freq: Four times a day (QID) | ORAL | 1 refills | Status: DC | PRN

## 2018-09-19 NOTE — Progress Notes (Signed)
Called patient to introduce myself as Arboriculturist and to offer available resources.  Discussed copay assistance through [employer] to assist with copays for treatment drugs and qualifications. Patient will call me back with needed information to apply.  Discussed one-time $1000 Radio broadcast assistant to assist with personal expenses while going through treatment. Patient will bring needed information on 09/25/18.  Will also enroll patient in copay assistance through [employer] [employer] for [employer].  Gave her my direct number for any additional financial questions or concerns.

## 2018-09-19 NOTE — Addendum Note (Signed)
Addended by: Nicholas Lose on: 09/19/2018 08:17 AM   Modules accepted: Orders

## 2018-09-19 NOTE — Progress Notes (Signed)
START ON PATHWAY REGIMEN - Breast   Dose-Dense AC q14 days:   A cycle is every 14 days:     Doxorubicin      Cyclophosphamide      Pegfilgrastim-xxxx   **Always confirm dose/schedule in your pharmacy ordering system**  Paclitaxel 80 mg/m2 Weekly:   Administer weekly:     Paclitaxel   **Always confirm dose/schedule in your pharmacy ordering system**  Patient Characteristics: Postoperative without Neoadjuvant Therapy (Pathologic Staging), Invasive Disease, Adjuvant Therapy, HER2 Negative/Unknown/Equivocal, ER Positive, Node Negative, pT1a-c, pN0/N1mi or pT2 or Higher, pN0, Oncotype High Risk (? 26) Therapeutic Status: Postoperative without Neoadjuvant Therapy (Pathologic Staging) AJCC Grade: G3 AJCC N Category: pN0 AJCC M Category: cM0 ER Status: Positive (+) AJCC 8 Stage Grouping: IA HER2 Status: Negative (-) Oncotype Dx Recurrence Score: 44 AJCC T Category: pT1a PR Status: Negative (-) Has this patient completed genomic testing<= Yes - Oncotype DX(R) Intent of Therapy: Curative Intent, Discussed with Patient 

## 2018-09-23 ENCOUNTER — Ambulatory Visit: Payer: BLUE CROSS/BLUE SHIELD | Attending: Surgery | Admitting: Physical Therapy

## 2018-09-24 ENCOUNTER — Ambulatory Visit (HOSPITAL_BASED_OUTPATIENT_CLINIC_OR_DEPARTMENT_OTHER): Payer: BLUE CROSS/BLUE SHIELD | Admitting: Certified Registered"

## 2018-09-24 ENCOUNTER — Other Ambulatory Visit: Payer: BLUE CROSS/BLUE SHIELD

## 2018-09-24 ENCOUNTER — Ambulatory Visit: Payer: BLUE CROSS/BLUE SHIELD

## 2018-09-24 ENCOUNTER — Ambulatory Visit (HOSPITAL_BASED_OUTPATIENT_CLINIC_OR_DEPARTMENT_OTHER)
Admission: RE | Admit: 2018-09-24 | Discharge: 2018-09-24 | Disposition: A | Discharge status: Home / Self Care | Payer: BLUE CROSS/BLUE SHIELD | Source: Ambulatory Visit | Attending: Surgery | Admitting: Surgery

## 2018-09-24 ENCOUNTER — Ambulatory Visit (HOSPITAL_COMMUNITY): Payer: BLUE CROSS/BLUE SHIELD

## 2018-09-24 ENCOUNTER — Encounter (HOSPITAL_BASED_OUTPATIENT_CLINIC_OR_DEPARTMENT_OTHER): Payer: Self-pay | Admitting: *Deleted

## 2018-09-24 ENCOUNTER — Other Ambulatory Visit: Payer: Self-pay

## 2018-09-24 ENCOUNTER — Encounter (HOSPITAL_BASED_OUTPATIENT_CLINIC_OR_DEPARTMENT_OTHER)
Admission: RE | Disposition: A | Discharge status: Home / Self Care | Payer: Self-pay | Source: Ambulatory Visit | Attending: Surgery

## 2018-09-24 DIAGNOSIS — C50311 Malignant neoplasm of lower-inner quadrant of right female breast: Secondary | ICD-10-CM | POA: Diagnosis not present

## 2018-09-24 DIAGNOSIS — C50911 Malignant neoplasm of unspecified site of right female breast: Secondary | ICD-10-CM | POA: Diagnosis present

## 2018-09-24 DIAGNOSIS — Z886 Allergy status to analgesic agent status: Secondary | ICD-10-CM | POA: Diagnosis not present

## 2018-09-24 DIAGNOSIS — I10 Essential (primary) hypertension: Secondary | ICD-10-CM | POA: Insufficient documentation

## 2018-09-24 DIAGNOSIS — Z6841 Body Mass Index (BMI) 40.0 and over, adult: Secondary | ICD-10-CM | POA: Insufficient documentation

## 2018-09-24 DIAGNOSIS — Z79899 Other long term (current) drug therapy: Secondary | ICD-10-CM | POA: Insufficient documentation

## 2018-09-24 DIAGNOSIS — Z888 Allergy status to other drugs, medicaments and biological substances status: Secondary | ICD-10-CM | POA: Diagnosis not present

## 2018-09-24 DIAGNOSIS — Z170 Estrogen receptor positive status [ER+]: Secondary | ICD-10-CM | POA: Insufficient documentation

## 2018-09-24 DIAGNOSIS — Z95828 Presence of other vascular implants and grafts: Secondary | ICD-10-CM

## 2018-09-24 HISTORY — PX: PORTACATH PLACEMENT: SHX2246

## 2018-09-24 HISTORY — DX: Adverse effect of unspecified anesthetic, initial encounter: T41.45XA

## 2018-09-24 HISTORY — DX: Other specified postprocedural states: Z98.890

## 2018-09-24 HISTORY — DX: Other complications of anesthesia, initial encounter: T88.59XA

## 2018-09-24 HISTORY — DX: Nausea with vomiting, unspecified: R11.2

## 2018-09-24 SURGERY — INSERTION, TUNNELED CENTRAL VENOUS DEVICE, WITH PORT
Anesthesia: General | Site: Chest | Laterality: Right

## 2018-09-24 MED ORDER — SCOPOLAMINE 1 MG/3DAYS TD PT72
MEDICATED_PATCH | TRANSDERMAL | Status: AC
  Filled 2018-09-24: qty 1

## 2018-09-24 MED ORDER — ONDANSETRON HCL 4 MG/2ML IJ SOLN
INTRAMUSCULAR | Status: AC
  Filled 2018-09-24: qty 2

## 2018-09-24 MED ORDER — FENTANYL CITRATE (PF) 100 MCG/2ML IJ SOLN
INTRAMUSCULAR | Status: AC
  Filled 2018-09-24: qty 2

## 2018-09-24 MED ORDER — DEXAMETHASONE SODIUM PHOSPHATE 10 MG/ML IJ SOLN
INTRAMUSCULAR | Status: DC | PRN
  Administered 2018-09-24: 10 mg via INTRAVENOUS

## 2018-09-24 MED ORDER — BUPIVACAINE HCL (PF) 0.25 % IJ SOLN
INTRAMUSCULAR | Status: DC | PRN
  Administered 2018-09-24: 14 mL

## 2018-09-24 MED ORDER — LACTATED RINGERS IV SOLN
INTRAVENOUS | Status: DC
  Administered 2018-09-24 (×3): via INTRAVENOUS

## 2018-09-24 MED ORDER — PROPOFOL 10 MG/ML IV BOLUS
INTRAVENOUS | Status: AC
  Filled 2018-09-24: qty 20

## 2018-09-24 MED ORDER — DEXAMETHASONE SODIUM PHOSPHATE 10 MG/ML IJ SOLN
INTRAMUSCULAR | Status: AC
  Filled 2018-09-24: qty 1

## 2018-09-24 MED ORDER — FENTANYL CITRATE (PF) 100 MCG/2ML IJ SOLN
50.0000 ug | INTRAMUSCULAR | Status: DC | PRN
  Administered 2018-09-24 (×2): 50 ug via INTRAVENOUS

## 2018-09-24 MED ORDER — DEXTROSE 5 % IV SOLN: 3.0000 g | INTRAVENOUS | Status: DC

## 2018-09-24 MED ORDER — LIDOCAINE 2% (20 MG/ML) 5 ML SYRINGE
INTRAMUSCULAR | Status: AC
  Filled 2018-09-24: qty 5

## 2018-09-24 MED ORDER — PHENYLEPHRINE HCL 10 MG/ML IJ SOLN
INTRAMUSCULAR | Status: DC | PRN
  Administered 2018-09-24 (×7): 120 ug via INTRAVENOUS

## 2018-09-24 MED ORDER — MIDAZOLAM HCL 2 MG/2ML IJ SOLN: 0.5000 mg | Freq: Once | INTRAMUSCULAR | Status: DC | PRN

## 2018-09-24 MED ORDER — CHLORHEXIDINE GLUCONATE CLOTH 2 % EX PADS: 6.0000 | MEDICATED_PAD | Freq: Once | CUTANEOUS | Status: DC

## 2018-09-24 MED ORDER — ACETAMINOPHEN 500 MG PO TABS
1000.0000 mg | ORAL_TABLET | ORAL | Status: AC
  Administered 2018-09-24: 1000 mg via ORAL

## 2018-09-24 MED ORDER — GABAPENTIN 300 MG PO CAPS
300.0000 mg | ORAL_CAPSULE | ORAL | Status: AC
  Administered 2018-09-24: 300 mg via ORAL

## 2018-09-24 MED ORDER — GABAPENTIN 300 MG PO CAPS
ORAL_CAPSULE | ORAL | Status: AC
  Filled 2018-09-24: qty 1

## 2018-09-24 MED ORDER — LIDOCAINE HCL (CARDIAC) PF 100 MG/5ML IV SOSY
PREFILLED_SYRINGE | INTRAVENOUS | Status: DC | PRN
  Administered 2018-09-24: 100 mg via INTRAVENOUS

## 2018-09-24 MED ORDER — CEFAZOLIN SODIUM-DEXTROSE 2-4 GM/100ML-% IV SOLN
INTRAVENOUS | Status: AC
  Filled 2018-09-24: qty 100

## 2018-09-24 MED ORDER — HEPARIN (PORCINE) IN NACL 2-0.9 UNITS/ML
INTRAMUSCULAR | Status: AC | PRN
  Administered 2018-09-24: 1 via INTRAVENOUS

## 2018-09-24 MED ORDER — CEFAZOLIN SODIUM-DEXTROSE 2-3 GM-%(50ML) IV SOLR
INTRAVENOUS | Status: DC | PRN
  Administered 2018-09-24: 2 g via INTRAVENOUS

## 2018-09-24 MED ORDER — EPHEDRINE SULFATE 50 MG/ML IJ SOLN
INTRAMUSCULAR | Status: DC | PRN
  Administered 2018-09-24: 10 mg via INTRAVENOUS

## 2018-09-24 MED ORDER — MIDAZOLAM HCL 2 MG/2ML IJ SOLN
1.0000 mg | INTRAMUSCULAR | Status: DC | PRN
  Administered 2018-09-24: 2 mg via INTRAVENOUS

## 2018-09-24 MED ORDER — SCOPOLAMINE 1 MG/3DAYS TD PT72
1.0000 | MEDICATED_PATCH | Freq: Once | TRANSDERMAL | Status: DC | PRN
  Administered 2018-09-24: 1.5 mg via TRANSDERMAL

## 2018-09-24 MED ORDER — PROPOFOL 10 MG/ML IV BOLUS
INTRAVENOUS | Status: DC | PRN
  Administered 2018-09-24: 200 mg via INTRAVENOUS

## 2018-09-24 MED ORDER — ACETAMINOPHEN 500 MG PO TABS
ORAL_TABLET | ORAL | Status: AC
  Filled 2018-09-24: qty 2

## 2018-09-24 MED ORDER — HEPARIN SOD (PORK) LOCK FLUSH 100 UNIT/ML IV SOLN
INTRAVENOUS | Status: DC | PRN
  Administered 2018-09-24: 500 [IU] via INTRAVENOUS

## 2018-09-24 MED ORDER — MIDAZOLAM HCL 2 MG/2ML IJ SOLN
INTRAMUSCULAR | Status: AC
  Filled 2018-09-24: qty 2

## 2018-09-24 MED ORDER — OXYCODONE HCL 5 MG PO TABS: 5.0000 mg | ORAL_TABLET | Freq: Four times a day (QID) | ORAL | 0 refills | Status: DC | PRN

## 2018-09-24 MED ORDER — MEPERIDINE HCL 25 MG/ML IJ SOLN: 6.2500 mg | INTRAMUSCULAR | Status: DC | PRN

## 2018-09-24 MED ORDER — PROMETHAZINE HCL 25 MG/ML IJ SOLN: 6.2500 mg | INTRAMUSCULAR | Status: DC | PRN

## 2018-09-24 MED ORDER — FENTANYL CITRATE (PF) 100 MCG/2ML IJ SOLN
25.0000 ug | INTRAMUSCULAR | Status: DC | PRN
  Administered 2018-09-24: 25 ug via INTRAVENOUS

## 2018-09-24 SURGICAL SUPPLY — 63 items
ADH SKN CLS APL DERMABOND .7 (GAUZE/BANDAGES/DRESSINGS) ×1
APL SKNCLS STERI-STRIP NONHPOA (GAUZE/BANDAGES/DRESSINGS)
BAG DECANTER FOR FLEXI CONT (MISCELLANEOUS) ×2 IMPLANT
BENZOIN TINCTURE PRP APPL 2/3 (GAUZE/BANDAGES/DRESSINGS) IMPLANT
BLADE HEX COATED 2.75 (ELECTRODE) ×2 IMPLANT
BLADE SURG 11 STRL SS (BLADE) ×2 IMPLANT
BLADE SURG 15 STRL LF DISP TIS (BLADE) ×1 IMPLANT
BLADE SURG 15 STRL SS (BLADE) ×2
CANISTER SUCT 1200ML W/VALVE (MISCELLANEOUS) IMPLANT
CHLORAPREP W/TINT 26ML (MISCELLANEOUS) ×2 IMPLANT
COVER BACK TABLE 60X90IN (DRAPES) ×2 IMPLANT
COVER MAYO STAND STRL (DRAPES) ×2 IMPLANT
COVER PROBE 5X48 (MISCELLANEOUS) ×2
DECANTER SPIKE VIAL GLASS SM (MISCELLANEOUS) IMPLANT
DERMABOND ADVANCED (GAUZE/BANDAGES/DRESSINGS) ×1
DERMABOND ADVANCED .7 DNX12 (GAUZE/BANDAGES/DRESSINGS) ×1 IMPLANT
DRAPE C-ARM 42X72 X-RAY (DRAPES) ×2 IMPLANT
DRAPE LAPAROSCOPIC ABDOMINAL (DRAPES) ×2 IMPLANT
DRAPE UTILITY XL STRL (DRAPES) ×2 IMPLANT
DRSG TEGADERM 2-3/8X2-3/4 SM (GAUZE/BANDAGES/DRESSINGS) IMPLANT
DRSG TEGADERM 4X4.75 (GAUZE/BANDAGES/DRESSINGS) ×1 IMPLANT
ELECT REM PT RETURN 9FT ADLT (ELECTROSURGICAL) ×2
ELECTRODE REM PT RTRN 9FT ADLT (ELECTROSURGICAL) ×1 IMPLANT
GAUZE SPONGE 4X4 12PLY STRL LF (GAUZE/BANDAGES/DRESSINGS) ×1 IMPLANT
GLOVE BIO SURGEON STRL SZ 6.5 (GLOVE) ×1 IMPLANT
GLOVE BIOGEL PI IND STRL 7.0 (GLOVE) IMPLANT
GLOVE BIOGEL PI IND STRL 7.5 (GLOVE) IMPLANT
GLOVE BIOGEL PI IND STRL 8 (GLOVE) ×1 IMPLANT
GLOVE BIOGEL PI INDICATOR 7.0 (GLOVE) ×1
GLOVE BIOGEL PI INDICATOR 7.5 (GLOVE) ×1
GLOVE BIOGEL PI INDICATOR 8 (GLOVE) ×1
GLOVE ECLIPSE 8.0 STRL XLNG CF (GLOVE) ×2 IMPLANT
GLOVE SURG SS PI 7.0 STRL IVOR (GLOVE) ×1 IMPLANT
GOWN STRL REUS W/ TWL LRG LVL3 (GOWN DISPOSABLE) ×2 IMPLANT
GOWN STRL REUS W/TWL LRG LVL3 (GOWN DISPOSABLE) ×6
IV KIT MINILOC 20X1 SAFETY (NEEDLE) IMPLANT
KIT CVR 48X5XPRB PLUP LF (MISCELLANEOUS) ×1 IMPLANT
KIT PORT POWER 8FR ISP CVUE (Port) ×1 IMPLANT
NDL HYPO 25X1 1.5 SAFETY (NEEDLE) ×1 IMPLANT
NDL SAFETY ECLIPSE 18X1.5 (NEEDLE) IMPLANT
NDL SPNL 22GX3.5 QUINCKE BK (NEEDLE) IMPLANT
NEEDLE HYPO 18GX1.5 SHARP (NEEDLE)
NEEDLE HYPO 22GX1.5 SAFETY (NEEDLE) IMPLANT
NEEDLE HYPO 25X1 1.5 SAFETY (NEEDLE) ×2 IMPLANT
NEEDLE SPNL 22GX3.5 QUINCKE BK (NEEDLE) IMPLANT
PACK BASIN DAY SURGERY FS (CUSTOM PROCEDURE TRAY) ×2 IMPLANT
PENCIL BUTTON HOLSTER BLD 10FT (ELECTRODE) ×2 IMPLANT
SET SHEATH INTRODUCER 10FR (MISCELLANEOUS) IMPLANT
SHEATH COOK PEEL AWAY SET 9F (SHEATH) IMPLANT
SLEEVE SCD COMPRESS KNEE MED (MISCELLANEOUS) ×1 IMPLANT
SPONGE LAP 4X18 RFD (DISPOSABLE) ×1 IMPLANT
STRIP CLOSURE SKIN 1/2X4 (GAUZE/BANDAGES/DRESSINGS) IMPLANT
SUT MON AB 4-0 PC3 18 (SUTURE) ×2 IMPLANT
SUT PROLENE 2 0 CT2 30 (SUTURE) ×1 IMPLANT
SUT PROLENE 2 0 SH DA (SUTURE) ×2 IMPLANT
SUT SILK 2 0 TIES 17X18 (SUTURE)
SUT SILK 2-0 18XBRD TIE BLK (SUTURE) IMPLANT
SUT VICRYL 3-0 CR8 SH (SUTURE) ×2 IMPLANT
SYR 5ML LUER SLIP (SYRINGE) ×2 IMPLANT
SYR CONTROL 10ML LL (SYRINGE) ×2 IMPLANT
TOWEL GREEN STERILE FF (TOWEL DISPOSABLE) ×4 IMPLANT
TUBE CONNECTING 20X1/4 (TUBING) IMPLANT
YANKAUER SUCT BULB TIP NO VENT (SUCTIONS) IMPLANT

## 2018-09-24 NOTE — Anesthesia Procedure Notes (Signed)
Procedure Name: LMA Insertion Performed by: Verita Lamb, CRNA Pre-anesthesia Checklist: Patient identified, Emergency Drugs available, Suction available, Patient being monitored and Timeout performed Patient Re-evaluated:Patient Re-evaluated prior to induction Oxygen Delivery Method: Circle system utilized Preoxygenation: Pre-oxygenation with 100% oxygen Induction Type: IV induction LMA: LMA inserted LMA Size: 4.0 Placement Confirmation: positive ETCO2,  CO2 detector and breath sounds checked- equal and bilateral Tube secured with: Tape Dental Injury: Teeth and Oropharynx as per pre-operative assessment

## 2018-09-24 NOTE — Discharge Instructions (Signed)

## 2018-09-24 NOTE — Transfer of Care (Signed)
Immediate Anesthesia Transfer of Care Note  Patient: [name] [name]  Procedure(s) Performed: INSERTION PORT-A-CATH WITH ULTRASOUND (Right Chest)  Patient Location: PACU  Anesthesia Type:General  Level of Consciousness: awake, alert  and oriented  Airway & Oxygen Therapy: Patient Spontanous Breathing and Patient connected to face mask oxygen  Post-op Assessment: Report given to RN and Post -op Vital signs reviewed and stable  Post vital signs: Reviewed and stable  Last Vitals:  Vitals Value Taken Time  BP 127/79 09/24/2018  9:45 AM  Temp    Pulse 102 09/24/2018  9:46 AM  Resp 10 09/24/2018  9:46 AM  SpO2 99 % 09/24/2018  9:46 AM  Vitals shown include unvalidated device data.  Last Pain:  Vitals:   09/24/18 0737  TempSrc: Oral  PainSc: 0-No pain      Patients Stated Pain Goal: 0 (16/57/90 3833)  Complications: No apparent anesthesia complications

## 2018-09-24 NOTE — Anesthesia Preprocedure Evaluation (Addendum)
Anesthesia Evaluation  Patient identified by MRN, date of birth, ID band Patient awake    Reviewed: Allergy & Precautions, NPO status , Patient's Chart, lab work & pertinent test results  History of Anesthesia Complications (+) PONV  Airway Mallampati: II  TM Distance: >3 FB Neck ROM: Full    Dental  (+) Missing, Dental Advisory Given   Pulmonary neg pulmonary ROS,    breath sounds clear to auscultation       Cardiovascular hypertension, Pt. on medications (-) angina Rhythm:Regular Rate:Normal  09/13/2018 ECHO: EF 60-65%, valves OK   Neuro/Psych negative neurological ROS     GI/Hepatic Neg liver ROS, GERD  Controlled,  Endo/Other  Morbid obesity  Renal/GU negative Renal ROS     Musculoskeletal  (+) Arthritis ,   Abdominal (+) + obese,   Peds  Hematology negative hematology ROS (+)   Anesthesia Other Findings Breast cancer  Reproductive/Obstetrics S/p BTL                            Anesthesia Physical Anesthesia Plan  ASA: III  Anesthesia Plan: General   Post-op Pain Management:    Induction: Intravenous  PONV Risk Score and Plan: 4 or greater and Ondansetron, Dexamethasone and Scopolamine patch - Pre-op  Airway Management Planned: LMA  Additional Equipment:   Intra-op Plan:   Post-operative Plan:   Informed Consent: I have reviewed the patients History and Physical, chart, labs and discussed the procedure including the risks, benefits and alternatives for the proposed anesthesia with the patient or authorized representative who has indicated his/her understanding and acceptance.     Dental advisory given  Plan Discussed with: CRNA and Surgeon  Anesthesia Plan Comments:         Anesthesia Quick Evaluation

## 2018-09-24 NOTE — Interval H&P Note (Signed)
History and Physical Interval Note:  09/24/2018 8:21 AM  [name] [name]  has presented today for surgery, with the diagnosis of poor venous access, right breast cancer  The various methods of treatment have been discussed with the patient and family. After consideration of risks, benefits and other options for treatment, the patient has consented to  Procedure(s): INSERTION PORT-A-CATH WITH ULTRASOUND (N/A) as a surgical intervention .  The patient's history has been reviewed, patient examined, no change in status, stable for surgery.  I have reviewed the patient's chart and labs.  Questions were answered to the patient's satisfaction.     [place]

## 2018-09-24 NOTE — Op Note (Signed)
reoperative diagnosis: PAC needed/ poor venous access   Postoperative diagnosis: Same  Procedure: Portacath Placement with U/S and C arm guidance   Surgeon: Turner Daniels, MD, FACS  Anesthesia: General and 0.25 % marcaine   Clinical History and Indications: The patient is getting ready to begin chemotherapy for her cancer. She  needs a Port-A-Cath for venous access. Risk of bleeding, infection,  Collapse lung,  Death,  DVT,  Organ injury,  Mediastinal injury,  Injury to heart,  Injury to blood vessels,  Nerves,  Migration of catheter,  Embolization of catheter and the need for more surgery.  Description of Procedure: I have seen the patient in the holding area and confirmed the plans for the procedure as noted above. I reviewed the risks and complications again and the patient has no further questions. She wishes to proceed.   The patient was then taken to the operating room. After satisfactory general  anesthesia had been obtained the upper chest and lower neck were prepped and draped as a sterile field. The timeout was done.  The right internal jugular vein  was entered under U/S guidance  and the guidewire threaded into the superior vena cava right atrial area under fluoroscopic guidance. An incision was then made on the anterior chest wall and a subcutaneous pocket fashioned for the port reservoir.  The port tubing was then brought through a subcutaneous tunnel from the port site to the guidewire site.  The port and catheter were attached, locked  and flushed. The catheter was measured and cut to appropriate length.The dilator and peel-away sheath were then advanced over the guidewire while monitoring this with fluoroscopy. The guidewire and dilator were removed and the tubing threaded to approximately 21 cm. The peel-away sheath was then removed. The catheter aspirated and flushed easily. Using fluoroscopy the tip was in the superior vena cava right atrial junction area. It aspirated and  flushed easily. That aspirated and flushed easily.  The reservoir was secured to the fascia with 1 sutures of 2-0 Prolene. A final check with fluoroscopy was done to make sure we had no kinks and good positioning of the tip of the catheter. Everything appeared to be okay. The catheter was aspirated, flushed with dilute heparin and then concentrated aqueous heparin.  The incision was then closed with interrupted 3-0 Vicryl, and 4-0 Monocryl subcuticular with Dermabond on the skin.  The port was left accessed and secured after flushing. And occlusive dressing was placed.   There were no operative complications. Estimated blood loss was minimal. All counts were correct. The patient tolerated the procedure well.  Turner Daniels, MD, FACS

## 2018-09-24 NOTE — Anesthesia Postprocedure Evaluation (Signed)
Anesthesia Post Note  Patient: [name] [name]  Procedure(s) Performed: INSERTION PORT-A-CATH WITH ULTRASOUND (Right Chest)     Patient location during evaluation: PACU Anesthesia Type: General Level of consciousness: awake and alert, patient cooperative and oriented Pain management: pain level controlled Vital Signs Assessment: post-procedure vital signs reviewed and stable Respiratory status: spontaneous breathing, nonlabored ventilation and respiratory function stable Cardiovascular status: blood pressure returned to baseline and stable Postop Assessment: no apparent nausea or vomiting Anesthetic complications: no    Last Vitals:  Vitals:   09/24/18 1015 09/24/18 1030  BP: 122/74 123/79  Pulse: 90 95  Resp: 13 13  Temp:    SpO2: 93% 98%    Last Pain:  Vitals:   09/24/18 1030  TempSrc:   PainSc: 4                  Bellina Tokarczyk,E. Sarahbeth Cashin

## 2018-09-25 ENCOUNTER — Encounter: Payer: Self-pay | Admitting: Hematology and Oncology

## 2018-09-25 ENCOUNTER — Inpatient Hospital Stay: Payer: BLUE CROSS/BLUE SHIELD

## 2018-09-25 ENCOUNTER — Encounter: Payer: Self-pay | Admitting: General Practice

## 2018-09-25 ENCOUNTER — Inpatient Hospital Stay: Payer: BLUE CROSS/BLUE SHIELD | Admitting: General Practice

## 2018-09-25 ENCOUNTER — Inpatient Hospital Stay: Payer: BLUE CROSS/BLUE SHIELD | Attending: Hematology and Oncology

## 2018-09-25 ENCOUNTER — Encounter (HOSPITAL_BASED_OUTPATIENT_CLINIC_OR_DEPARTMENT_OTHER): Payer: Self-pay | Admitting: Surgery

## 2018-09-25 VITALS — BP 157/83 | HR 94 | Temp 98.4°F | Resp 18

## 2018-09-25 DIAGNOSIS — Z79899 Other long term (current) drug therapy: Secondary | ICD-10-CM | POA: Insufficient documentation

## 2018-09-25 DIAGNOSIS — R110 Nausea: Secondary | ICD-10-CM | POA: Insufficient documentation

## 2018-09-25 DIAGNOSIS — Z95828 Presence of other vascular implants and grafts: Secondary | ICD-10-CM | POA: Insufficient documentation

## 2018-09-25 DIAGNOSIS — R5383 Other fatigue: Secondary | ICD-10-CM | POA: Diagnosis not present

## 2018-09-25 DIAGNOSIS — C50311 Malignant neoplasm of lower-inner quadrant of right female breast: Secondary | ICD-10-CM

## 2018-09-25 DIAGNOSIS — R51 Headache: Secondary | ICD-10-CM | POA: Diagnosis not present

## 2018-09-25 DIAGNOSIS — Z170 Estrogen receptor positive status [ER+]: Secondary | ICD-10-CM | POA: Diagnosis not present

## 2018-09-25 DIAGNOSIS — Z7689 Persons encountering health services in other specified circumstances: Secondary | ICD-10-CM | POA: Insufficient documentation

## 2018-09-25 DIAGNOSIS — Z5111 Encounter for antineoplastic chemotherapy: Secondary | ICD-10-CM | POA: Diagnosis not present

## 2018-09-25 LAB — CMP (CANCER CENTER ONLY)
ALT: 15 U/L (ref 0–44)
AST: 15 U/L (ref 15–41)
Albumin: 3.9 g/dL (ref 3.5–5.0)
Alkaline Phosphatase: 81 U/L (ref 38–126)
Anion gap: 8 (ref 5–15)
BUN: 14 mg/dL (ref 6–20)
CO2: 29 mmol/L (ref 22–32)
Calcium: 9.5 mg/dL (ref 8.9–10.3)
Chloride: 106 mmol/L (ref 98–111)
Creatinine: 0.91 mg/dL (ref 0.44–1.00)
GFR, Est AFR Am: >60 mL/min (ref >60)
GFR, Estimated: >60 mL/min (ref >60)
Glucose, Bld: 87 mg/dL (ref 70–99)
Potassium: 3.4 mmol/L — ABNORMAL LOW (ref 3.5–5.1)
Sodium: 143 mmol/L (ref 135–145)
Total Bilirubin: 0.3 mg/dL (ref 0.3–1.2)
Total Protein: 7.4 g/dL (ref 6.5–8.1)

## 2018-09-25 LAB — CBC WITH DIFFERENTIAL (CANCER CENTER ONLY)
Abs Immature Granulocytes: 0.06 10*3/uL (ref 0.00–0.07)
Basophils Absolute: 0 10*3/uL (ref 0.0–0.1)
Basophils Relative: 0 %
Eosinophils Absolute: 0 10*3/uL (ref 0.0–0.5)
Eosinophils Relative: 0 %
HCT: 36.2 % (ref 36.0–46.0)
Hemoglobin: 11.7 g/dL — ABNORMAL LOW (ref 12.0–15.0)
Immature Granulocytes: 1 %
Lymphocytes Relative: 14 %
Lymphs Abs: 1.6 10*3/uL (ref 0.7–4.0)
MCH: 29.3 pg (ref 26.0–34.0)
MCHC: 32.3 g/dL (ref 30.0–36.0)
MCV: 90.5 fL (ref 80.0–100.0)
Monocytes Absolute: 1 10*3/uL (ref 0.1–1.0)
Monocytes Relative: 9 %
Neutro Abs: 8.4 10*3/uL — ABNORMAL HIGH (ref 1.7–7.7)
Neutrophils Relative %: 76 %
Platelet Count: 144 10*3/uL — ABNORMAL LOW (ref 150–400)
RBC: 4 MIL/uL (ref 3.87–5.11)
RDW: 13.2 % (ref 11.5–15.5)
WBC Count: 11.1 10*3/uL — ABNORMAL HIGH (ref 4.0–10.5)
nRBC: 0 % (ref 0.0–0.2)

## 2018-09-25 MED ORDER — HEPARIN SOD (PORK) LOCK FLUSH 100 UNIT/ML IV SOLN
500.0000 [IU] | Freq: Once | INTRAVENOUS | Status: AC | PRN
  Administered 2018-09-25: 500 [IU]
  Filled 2018-09-25: qty 5

## 2018-09-25 MED ORDER — SODIUM CHLORIDE 0.9 % IV SOLN
Freq: Once | INTRAVENOUS | Status: AC
  Administered 2018-09-25: 12:00:00 via INTRAVENOUS
  Filled 2018-09-25: qty 250

## 2018-09-25 MED ORDER — SODIUM CHLORIDE 0.9% FLUSH
10.0000 mL | Freq: Once | INTRAVENOUS | Status: AC
  Administered 2018-09-25: 10 mL
  Filled 2018-09-25: qty 10

## 2018-09-25 MED ORDER — DOXORUBICIN HCL CHEMO IV INJECTION 2 MG/ML
60.0000 mg/m2 | Freq: Once | INTRAVENOUS | Status: AC
  Administered 2018-09-25: 140 mg via INTRAVENOUS
  Filled 2018-09-25: qty 70

## 2018-09-25 MED ORDER — PALONOSETRON HCL INJECTION 0.25 MG/5ML
INTRAVENOUS | Status: AC
  Filled 2018-09-25: qty 5

## 2018-09-25 MED ORDER — SODIUM CHLORIDE 0.9 % IV SOLN
600.0000 mg/m2 | Freq: Once | INTRAVENOUS | Status: AC
  Administered 2018-09-25: 1400 mg via INTRAVENOUS
  Filled 2018-09-25: qty 70

## 2018-09-25 MED ORDER — SODIUM CHLORIDE 0.9% FLUSH
10.0000 mL | INTRAVENOUS | Status: DC | PRN
  Administered 2018-09-25: 10 mL
  Filled 2018-09-25: qty 10

## 2018-09-25 MED ORDER — PALONOSETRON HCL INJECTION 0.25 MG/5ML
0.2500 mg | Freq: Once | INTRAVENOUS | Status: AC
  Administered 2018-09-25: 0.25 mg via INTRAVENOUS

## 2018-09-25 MED ORDER — SODIUM CHLORIDE 0.9 % IV SOLN
Freq: Once | INTRAVENOUS | Status: AC
  Administered 2018-09-25: 12:00:00 via INTRAVENOUS
  Filled 2018-09-25: qty 5

## 2018-09-25 NOTE — Progress Notes (Signed)
[facility] CSW Progress Notes  Request received from Financial Advocate to meet w patient to discuss additional resources for support.  Met briefly w patient during infusion.  Provided copies of [employer] [place], [employer] and [employer].  Asked patient to review and see if she would like to apply for any/all of these resources. Provided my contact information - asked that she call to schedule appt to complete applications, if desired.  Edwyna Shell, LCSW Clinical Social Worker Phone:  [phone]

## 2018-09-25 NOTE — Progress Notes (Signed)
[name] [name] Spiritual Care Note  Followed up with [name] [name] and her daughter today at her first infusion, about which she was quite nervous. Normalized feelings, provided emotional support, asked questions of [name]/RN to help assuage fears. Group conversation appeared to bring comfort and meaningful distraction to both pt and dtr during the waiting.   Plan to f/u when [name] is on campus, but please also page if needs arise or circumstances change. Thank you.   [place], [place], [facility] Pager [phone] Voicemail [phone]

## 2018-09-25 NOTE — Progress Notes (Signed)
Patient brought in proof of income for the [employer] as we previously discussed.  Patient approved for one-time $1000 grant based on income reduction. She has a copy of the approval letter as well as the expense sheet along with the [place] information. Went over expenses and how they are paid. She verbalized understanding.  No other copay assistance program available that she qualifies for but sent staff message to Social workers regarding concerns with copays and other medical expenses.   Enrolled patient in [place] [employer] for copay assistance with [employer]. Patient was succesfully enrolled but had not received approval letter by email. Called [employer] to have them submit another one to bne faxed. Patient will receive one via mail as well for her records.  She has my card for any additional financial questions or concerns.

## 2018-09-25 NOTE — Patient Instructions (Addendum)
[place] Discharge Instructions for Patients Receiving Chemotherapy  Today you received the following chemotherapy agents adriamycin/cytoxan   To help prevent nausea and vomiting after your treatment, we encourage you to take your nausea medication as directed. *No zofran for three days* Can resume 09/29/2018 If you develop nausea and vomiting that is not controlled by your nausea medication, call the clinic.   BELOW ARE SYMPTOMS THAT SHOULD BE REPORTED IMMEDIATELY:  *FEVER GREATER THAN 100.5 F  *CHILLS WITH OR WITHOUT FEVER  NAUSEA AND VOMITING THAT IS NOT CONTROLLED WITH YOUR NAUSEA MEDICATION  *UNUSUAL SHORTNESS OF BREATH  *UNUSUAL BRUISING OR BLEEDING  TENDERNESS IN MOUTH AND THROAT WITH OR WITHOUT PRESENCE OF ULCERS  *URINARY PROBLEMS  *BOWEL PROBLEMS  UNUSUAL RASH Items with * indicate a potential emergency and should be followed up as soon as possible.  Feel free to call the clinic you have any questions or concerns. The clinic phone number is [phone].    Doxorubicin injection What is this medicine? DOXORUBICIN (dox oh ROO bi sin) is a chemotherapy drug. It is used to treat many kinds of cancer like leukemia, lymphoma, neuroblastoma, sarcoma, and Wilms' tumor. It is also used to treat bladder cancer, breast cancer, lung cancer, ovarian cancer, stomach cancer, and thyroid cancer. This medicine may be used for other purposes; ask your health care provider or pharmacist if you have questions. COMMON BRAND NAME(S): Adriamycin, Adriamycin PFS, Adriamycin RDF, Rubex What should I tell my health care provider before I take this medicine? They need to know if you have any of these conditions: -heart disease -history of low blood counts caused by a medicine -liver disease -recent or ongoing radiation therapy -an unusual or allergic reaction to doxorubicin, other chemotherapy agents, other medicines, foods, dyes, or preservatives -pregnant or trying to  get pregnant -breast-feeding How should I use this medicine? This drug is given as an infusion into a vein. It is administered in a hospital or clinic by a specially trained health care professional. If you have pain, swelling, burning or any unusual feeling around the site of your injection, tell your health care professional right away. Talk to your pediatrician regarding the use of this medicine in children. Special care may be needed. Overdosage: If you think you have taken too much of this medicine contact a poison control center or emergency room at once. NOTE: This medicine is only for you. Do not share this medicine with others. What if I miss a dose? It is important not to miss your dose. Call your doctor or health care professional if you are unable to keep an appointment. What may interact with this medicine? This medicine may interact with the following medications: -6-mercaptopurine -paclitaxel -phenytoin -St. John's Wort -trastuzumab -verapamil This list may not describe all possible interactions. Give your health care provider a list of all the medicines, herbs, non-prescription drugs, or dietary supplements you use. Also tell them if you smoke, drink alcohol, or use illegal drugs. Some items may interact with your medicine. What should I watch for while using this medicine? This drug may make you feel generally unwell. This is not uncommon, as chemotherapy can affect healthy cells as well as cancer cells. Report any side effects. Continue your course of treatment even though you feel ill unless your doctor tells you to stop. There is a maximum amount of this medicine you should receive throughout your life. The amount depends on the medical condition being treated and your overall health. Your  doctor will watch how much of this medicine you receive in your lifetime. Tell your doctor if you have taken this medicine before. You may need blood work done while you are taking this  medicine. Your urine may turn red for a few days after your dose. This is not blood. If your urine is dark or brown, call your doctor. In some cases, you may be given additional medicines to help with side effects. Follow all directions for their use. Call your doctor or health care professional for advice if you get a fever, chills or sore throat, or other symptoms of a cold or flu. Do not treat yourself. This drug decreases your body's ability to fight infections. Try to avoid being around people who are sick. This medicine may increase your risk to bruise or bleed. Call your doctor or health care professional if you notice any unusual bleeding. Talk to your doctor about your risk of cancer. You may be more at risk for certain types of cancers if you take this medicine. Do not become pregnant while taking this medicine or for 6 months after stopping it. Women should inform their doctor if they wish to become pregnant or think they might be pregnant. Men should not father a child while taking this medicine and for 6 months after stopping it. There is a potential for serious side effects to an unborn child. Talk to your health care professional or pharmacist for more information. Do not breast-feed an infant while taking this medicine. This medicine has caused ovarian failure in some women and reduced sperm counts in some men This medicine may interfere with the ability to have a child. Talk with your doctor or health care professional if you are concerned about your fertility. This medicine may cause a decrease in Co-Enzyme Q-10. You should make sure that you get enough Co-Enzyme Q-10 while you are taking this medicine. Discuss the foods you eat and the vitamins you take with your health care professional. What side effects may I notice from receiving this medicine? Side effects that you should report to your doctor or health care professional as soon as possible: -allergic reactions like skin rash,  itching or hives, swelling of the face, lips, or tongue -breathing problems -chest pain -fast or irregular heartbeat -low blood counts - this medicine may decrease the number of white blood cells, red blood cells and platelets. You may be at increased risk for infections and bleeding. -pain, redness, or irritation at site where injected -signs of infection - fever or chills, cough, sore throat, pain or difficulty passing urine -signs of decreased platelets or bleeding - bruising, pinpoint red spots on the skin, black, tarry stools, blood in the urine -swelling of the ankles, feet, hands -tiredness -weakness Side effects that usually do not require medical attention (report to your doctor or health care professional if they continue or are bothersome): -diarrhea -hair loss -mouth sores -nail discoloration or damage -nausea -red colored urine -vomiting This list may not describe all possible side effects. Call your doctor for medical advice about side effects. You may report side effects to FDA at 1-800-FDA-1088. Where should I keep my medicine? This drug is given in a hospital or clinic and will not be stored at home. NOTE: This sheet is a summary. It may not cover all possible information. If you have questions about this medicine, talk to your doctor, pharmacist, or health care provider.  2019 Elsevier/Gold Standard (2017-03-21 11:01:26)     Cyclophosphamide injection  What is this medicine? CYCLOPHOSPHAMIDE (sye kloe FOSS fa mide) is a chemotherapy drug. It slows the growth of cancer cells. This medicine is used to treat many types of cancer like lymphoma, myeloma, leukemia, breast cancer, and ovarian cancer, to name a few. This medicine may be used for other purposes; ask your health care provider or pharmacist if you have questions. COMMON BRAND NAME(S): Cytoxan, Neosar What should I tell my health care provider before I take this medicine? They need to know if you have any of  these conditions: -blood disorders -history of other chemotherapy -infection -kidney disease -liver disease -recent or ongoing radiation therapy -tumors in the bone marrow -an unusual or allergic reaction to cyclophosphamide, other chemotherapy, other medicines, foods, dyes, or preservatives -pregnant or trying to get pregnant -breast-feeding How should I use this medicine? This drug is usually given as an injection into a vein or muscle or by infusion into a vein. It is administered in a hospital or clinic by a specially trained health care professional. Talk to your pediatrician regarding the use of this medicine in children. Special care may be needed. Overdosage: If you think you have taken too much of this medicine contact a poison control center or emergency room at once. NOTE: This medicine is only for you. Do not share this medicine with others. What if I miss a dose? It is important not to miss your dose. Call your doctor or health care professional if you are unable to keep an appointment. What may interact with this medicine? This medicine may interact with the following medications: -amiodarone -amphotericin B -azathioprine -certain antiviral medicines for HIV or AIDS such as protease inhibitors (e.g., indinavir, ritonavir) and zidovudine -certain blood pressure medications such as benazepril, captopril, enalapril, fosinopril, lisinopril, moexipril, monopril, perindopril, quinapril, ramipril, trandolapril -certain cancer medications such as anthracyclines (e.g., daunorubicin, doxorubicin), busulfan, cytarabine, paclitaxel, pentostatin, tamoxifen, trastuzumab -certain diuretics such as chlorothiazide, chlorthalidone, hydrochlorothiazide, indapamide, metolazone -certain medicines that treat or prevent blood clots like warfarin -certain muscle relaxants such as succinylcholine -cyclosporine -etanercept -indomethacin -medicines to increase blood counts like filgrastim,  pegfilgrastim, sargramostim -medicines used as general anesthesia -metronidazole -natalizumab This list may not describe all possible interactions. Give your health care provider a list of all the medicines, herbs, non-prescription drugs, or dietary supplements you use. Also tell them if you smoke, drink alcohol, or use illegal drugs. Some items may interact with your medicine. What should I watch for while using this medicine? Visit your doctor for checks on your progress. This drug may make you feel generally unwell. This is not uncommon, as chemotherapy can affect healthy cells as well as cancer cells. Report any side effects. Continue your course of treatment even though you feel ill unless your doctor tells you to stop. Drink water or other fluids as directed. Urinate often, even at night. In some cases, you may be given additional medicines to help with side effects. Follow all directions for their use. Call your doctor or health care professional for advice if you get a fever, chills or sore throat, or other symptoms of a cold or flu. Do not treat yourself. This drug decreases your body's ability to fight infections. Try to avoid being around people who are sick. This medicine may increase your risk to bruise or bleed. Call your doctor or health care professional if you notice any unusual bleeding. Be careful brushing and flossing your teeth or using a toothpick because you may get an infection or bleed more easily.  If you have any dental work done, tell your dentist you are receiving this medicine. You may get drowsy or dizzy. Do not drive, use machinery, or do anything that needs mental alertness until you know how this medicine affects you. Do not become pregnant while taking this medicine or for 1 year after stopping it. Women should inform their doctor if they wish to become pregnant or think they might be pregnant. Men should not father a child while taking this medicine and for 4 months  after stopping it. There is a potential for serious side effects to an unborn child. Talk to your health care professional or pharmacist for more information. Do not breast-feed an infant while taking this medicine. This medicine may interfere with the ability to have a child. This medicine has caused ovarian failure in some women. This medicine has caused reduced sperm counts in some men. You should talk with your doctor or health care professional if you are concerned about your fertility. If you are going to have surgery, tell your doctor or health care professional that you have taken this medicine. What side effects may I notice from receiving this medicine? Side effects that you should report to your doctor or health care professional as soon as possible: -allergic reactions like skin rash, itching or hives, swelling of the face, lips, or tongue -low blood counts - this medicine may decrease the number of white blood cells, red blood cells and platelets. You may be at increased risk for infections and bleeding. -signs of infection - fever or chills, cough, sore throat, pain or difficulty passing urine -signs of decreased platelets or bleeding - bruising, pinpoint red spots on the skin, black, tarry stools, blood in the urine -signs of decreased red blood cells - unusually weak or tired, fainting spells, lightheadedness -breathing problems -dark urine -dizziness -palpitations -swelling of the ankles, feet, hands -trouble passing urine or change in the amount of urine -weight gain -yellowing of the eyes or skin Side effects that usually do not require medical attention (report to your doctor or health care professional if they continue or are bothersome): -changes in nail or skin color -hair loss -missed menstrual periods -mouth sores -nausea, vomiting This list may not describe all possible side effects. Call your doctor for medical advice about side effects. You may report side effects  to FDA at 1-800-FDA-1088. Where should I keep my medicine? This drug is given in a hospital or clinic and will not be stored at home. NOTE: This sheet is a summary. It may not cover all possible information. If you have questions about this medicine, talk to your doctor, pharmacist, or health care provider.  2019 Elsevier/Gold Standard (2012-06-21 16:22:58)

## 2018-09-25 NOTE — Progress Notes (Signed)
Pt stated she has anti-nausea patch on from post-procedure area.  This RN asked pt if patch was scopalamine, pt stated it was.  Verified with Kennith Center, RPh that medications being administered today did not conflict with scopalomine patch.  Pt notified.

## 2018-09-26 ENCOUNTER — Telehealth: Payer: Self-pay

## 2018-09-26 ENCOUNTER — Ambulatory Visit: Payer: BLUE CROSS/BLUE SHIELD

## 2018-09-26 NOTE — Telephone Encounter (Signed)
error 

## 2018-09-26 NOTE — Telephone Encounter (Signed)
Called pt to follow up on her chemo from yesterday. Pt doing well and is hydrating/eating okay. Pt taking her compazine for nausea without any issues. She reports that she is unable to take lorazepam d/t palpitation. She will no longer take this. Reviewed pt injection appt and claritin po use for 5-7 days for bone pain side effect. Pt verbalized understanding. Confirmed time/date of injection and next nadir check appt. Pt thankful for the call.

## 2018-09-26 NOTE — Progress Notes (Signed)
Patient Care Team: [name] [name], [name] Mercy, MD as PCP - General (Family Medicine) [name] Luna, MD as Consulting Physician (General Surgery) [name] Lose, MD as Consulting Physician (Hematology and Oncology) [name] Rudd, MD as Consulting Physician (Radiation Oncology)  DIAGNOSIS:    ICD-10-CM   1. Malignant neoplasm of lower-inner quadrant of right breast of female, estrogen receptor positive ([place]) C50.311    Z17.0     SUMMARY OF ONCOLOGIC HISTORY:   Malignant neoplasm of lower-inner quadrant of right breast of female, estrogen receptor positive ([place])   07/16/2018 Initial Diagnosis    Pain right breast UOQ: Negative on mammogram, incidentally found right breast mass LIQ at 3:30 position measuring 0.6 cm biopsy revealed grade 3 IDC ER 40%, PR 0%, HER-2 negative, Ki-67 50%, T1bN0 stage Ib    07/24/2018 Cancer Staging    Staging form: Breast, AJCC 8th Edition - Clinical: Stage IB (cT1b, cN0, cM0, G3, ER+, PR-, HER2-) - Signed by [name] Lose, MD on 07/24/2018    08/23/2018 Surgery    Right lumpectomy: IDC, 0.7 cm, grade 3, margins negative, 0/3 lymph nodes negative, ER 40%, PR 0%, HER-2 negative, Ki-67 50%, T1BN0 stage Ia    09/04/2018 Cancer Staging    Staging form: Breast, AJCC 8th Edition - Pathologic: Stage IA (pT1b, pN0, cM0, G3, ER+, PR-, HER2-) - Signed by [name] Phlegm, NP on 09/04/2018    09/25/2018 -  Chemotherapy    The patient had DOXOrubicin (ADRIAMYCIN) chemo injection 140 mg, 60 mg/m2 = 140 mg, Intravenous,  Once, 1 of 4 cycles Administration: 140 mg (09/25/2018) palonosetron (ALOXI) injection 0.25 mg, 0.25 mg, Intravenous,  Once, 1 of 4 cycles Administration: 0.25 mg (09/25/2018) pegfilgrastim-cbqv (UDENYCA) injection 6 mg, 6 mg, Subcutaneous, Once, 1 of 4 cycles Administration: 6 mg (09/27/2018) cyclophosphamide (CYTOXAN) 1,400 mg in sodium chloride 0.9 % 250 mL chemo infusion, 600 mg/m2 = 1,400 mg, Intravenous,  Once, 1 of 4 cycles Administration: 1,400 mg  (09/25/2018) PACLitaxel (TAXOL) 186 mg in sodium chloride 0.9 % 250 mL chemo infusion (</= 35m/m2), 80 mg/m2 = 186 mg, Intravenous,  Once, 0 of 12 cycles fosaprepitant (EMEND) 150 mg, dexamethasone (DECADRON) 12 mg in sodium chloride 0.9 % 145 mL IVPB, , Intravenous,  Once, 1 of 4 cycles Administration:  (09/25/2018)  for chemotherapy treatment.      CHIEF COMPLIANT: Cycle 1 Day 8 Adriamycin and Cytoxan  INTERVAL HISTORY: [name] Tornowis a 53y.o. with above-mentioned history of right breast cancer treated with lumpectomy. Her port was inserted on 09/24/18 by Dr. CBrantley Stage Her most recent ECHO from 09/13/18 showed an ejection fraction in the range of 60-65%. She presents to the clinic today with her daughter to begin chemotherapy with dose dense Adriamycin and Cytoxan. She tolerated treatment well. She reports fatigue, nausea, mental cloudiness, blurry vision, cramps in her calves and back, and constipation. She took Miralax but it made her nauseated. Nausea began the day of treatment and is worse with drinking water and protein shakes despite taking Zofran and Compazine. She reports Zofran works better than compazine. She denies vomiting. She reports a headache that began yesterday and loss of appetite and taste. Her labs from today show WBC 5.2, Hg 11.8, platelets 122, potassium 3.7. She asked if she could sit in a [facility] [place].   REVIEW OF SYSTEMS:   Constitutional: Denies fevers, chills or abnormal weight loss (+) fatigue (+) headache (+) loss of taste (+) loss of appetite Eyes: (+) blurriness of vision Ears, nose, mouth, throat,  and face: Denies mucositis or sore throat Respiratory: Denies cough, dyspnea or wheezes Cardiovascular: Denies palpitation, chest discomfort Gastrointestinal: Denies heartburn or change in bowel habits (+) nausea (+) constipation Skin: Denies abnormal skin rashes MSK: (+) cramps in calves and back Lymphatics: Denies new lymphadenopathy or easy  bruising Neurological: Denies numbness, tingling or new weaknesses (+) mental cloudiness Behavioral/Psych: Mood is stable, no new changes  Extremities: No lower extremity edema Breast: denies any pain or lumps or nodules in either breasts All other systems were reviewed with the patient and are negative.  I have reviewed the past medical history, past surgical history, social history and family history with the patient and they are unchanged from previous note.  ALLERGIES:  is allergic to ibuprofen; lisinopril; and dilaudid [hydromorphone hcl].  MEDICATIONS:  Current Outpatient Medications  Medication Sig Dispense Refill  . Cholecalciferol (VITAMIN D3) 5000 UNITS TABS Take 1 tablet by mouth daily.    . clobetasol ointment (TEMOVATE) 4.01 % Apply 1 application topically 2 (two) times daily. Then as needed for 30 days  1  . dexamethasone (DECADRON) 4 MG tablet Take 1 tablet day after chemo and 1 tablet 2 days after chemo with food 8 tablet 0  . Flaxseed, Linseed, (FLAX SEED OIL PO) Take 5 mLs by mouth every morning.    . gabapentin (NEURONTIN) 300 MG capsule Take 1 capsule (300 mg total) by mouth 2 (two) times daily. 20 capsule 1  . lidocaine-prilocaine (EMLA) cream Apply to affected area once 30 g 3  . loratadine (CLARITIN) 10 MG tablet Take 10 mg by mouth daily.    Marland Kitchen LORazepam (ATIVAN) 0.5 MG tablet Take 1 tablet (0.5 mg total) by mouth at bedtime as needed for sleep. 30 tablet 0  . naproxen sodium (ALEVE) 220 MG tablet Take 660 mg by mouth daily as needed (pain).    . Olmesartan-amLODIPine-HCTZ 40-10-25 MG TABS Take 1 tablet by mouth daily.    . ondansetron (ZOFRAN) 8 MG tablet Take 1 tablet (8 mg total) by mouth 2 (two) times daily as needed. Start on the third day after chemotherapy. 30 tablet 1  . oxyCODONE (OXY IR/ROXICODONE) 5 MG immediate release tablet Take 1 tablet (5 mg total) by mouth every 6 (six) hours as needed for severe pain. 10 tablet 0  . prochlorperazine (COMPAZINE) 10 MG  tablet Take 1 tablet (10 mg total) by mouth every 6 (six) hours as needed (Nausea or vomiting). 30 tablet 1  . promethazine (PHENERGAN) 25 MG tablet Take 1 tablet (25 mg total) by mouth every 6 (six) hours as needed for nausea. 30 tablet 3  . tetrahydrozoline-zinc (VISINE-AC) 0.05-0.25 % ophthalmic solution Place 2 drops into both eyes as needed (dry, itchy eyes).     No current facility-administered medications for this visit.     PHYSICAL EXAMINATION: ECOG PERFORMANCE STATUS: 1 - Symptomatic but completely ambulatory  Vitals:   10/01/18 0857  BP: 121/63  Pulse: 96  Resp: 17  Temp: 98.7 F (37.1 C)  SpO2: 100%   Filed Weights   10/01/18 0857  Weight: 249 lb (112.9 kg)    GENERAL: alert, no distress and comfortable SKIN: skin color, texture, turgor are normal, no rashes or significant lesions EYES: normal, Conjunctiva are pink and non-injected, sclera clear OROPHARYNX: no exudate, no erythema and lips, buccal mucosa, and tongue normal  NECK: supple, thyroid normal size, non-tender, without nodularity LYMPH: no palpable lymphadenopathy in the cervical, axillary or inguinal LUNGS: clear to auscultation and percussion with normal breathing  effort HEART: regular rate & rhythm and no murmurs and no lower extremity edema ABDOMEN: abdomen soft, non-tender and normal bowel sounds MUSCULOSKELETAL: no cyanosis of digits and no clubbing  NEURO: alert & oriented x 3 with fluent speech, no focal motor/sensory deficits EXTREMITIES: No lower extremity edema  LABORATORY DATA:  I have reviewed the data as listed CMP Latest Ref Rng & Units 10/01/2018 09/25/2018 09/18/2018  Glucose 70 - 99 mg/dL 97 87 99  BUN 6 - 20 mg/dL '13 14 9  ' Creatinine 0.44 - 1.00 mg/dL 0.92 0.91 0.78  Sodium 135 - 145 mmol/L 141 143 139  Potassium 3.5 - 5.1 mmol/L 3.7 3.4(L) 3.8  Chloride 98 - 111 mmol/L 101 106 103  CO2 22 - 32 mmol/L 33(H) 29 29  Calcium 8.9 - 10.3 mg/dL 9.2 9.5 9.3  Total Protein 6.5 - 8.1 g/dL 7.1  7.4 -  Total Bilirubin 0.3 - 1.2 mg/dL 0.8 0.3 -  Alkaline Phos 38 - 126 U/L 100 81 -  AST 15 - 41 U/L 14(L) 15 -  ALT 0 - 44 U/L 13 15 -    Lab Results  Component Value Date   WBC 5.2 10/01/2018   HGB 11.8 (L) 10/01/2018   HCT 36.9 10/01/2018   MCV 91.1 10/01/2018   PLT 122 (L) 10/01/2018   NEUTROABS PENDING 10/01/2018    ASSESSMENT & PLAN:  Malignant neoplasm of lower-inner quadrant of right breast of female, estrogen receptor positive ([facility]) 08/23/2018:Right lumpectomy: IDC, 0.7 cm, grade 3, margins negative, 0/3 lymph nodes negative, ER 40%, PR 0%, HER-2 negative, Ki-67 50%, T1BN0 stage Ia  Oncotype Dx 44: High Risk  Treatment plan: 1. Adj Chemo with Dose dense adriamycin and Cytoxan foll by Taxol weekly X 12 2. Adjuvant radiation therapy followed by 3. Adjuvant antiestrogen therapy ------------------------------------------------------------------------------------------------------------ Current Treatment: Cycle 1 day 8 DD AC Chemo Toxicities: 1.  Nausea in spite of antiemetics 2.  Fatigue 3.  Leg cramps  RTC in 1 week for cycle 2    No orders of the defined types were placed in this encounter.  The patient has a good understanding of the overall plan. she agrees with it. she will call with any problems that may develop before the next visit here.  [name] Lose, MD 10/01/2018  Julious Oka Dorshimer am acting as scribe for Dr. Nicholas [name].  I have reviewed the above documentation for accuracy and completeness, and I agree with the above.

## 2018-09-27 ENCOUNTER — Inpatient Hospital Stay: Payer: BLUE CROSS/BLUE SHIELD

## 2018-09-27 VITALS — BP 128/68 | Temp 98.5°F | Resp 18

## 2018-09-27 DIAGNOSIS — Z170 Estrogen receptor positive status [ER+]: Secondary | ICD-10-CM

## 2018-09-27 DIAGNOSIS — C50311 Malignant neoplasm of lower-inner quadrant of right female breast: Secondary | ICD-10-CM

## 2018-09-27 MED ORDER — PEGFILGRASTIM-CBQV 6 MG/0.6ML ~~LOC~~ SOSY
6.0000 mg | PREFILLED_SYRINGE | Freq: Once | SUBCUTANEOUS | Status: AC
  Administered 2018-09-27: 6 mg via SUBCUTANEOUS

## 2018-09-27 MED ORDER — PEGFILGRASTIM-CBQV 6 MG/0.6ML ~~LOC~~ SOSY
PREFILLED_SYRINGE | SUBCUTANEOUS | Status: AC
  Filled 2018-09-27: qty 0.6

## 2018-09-27 NOTE — Patient Instructions (Signed)
Pegfilgrastim injection  What is this medicine?  PEGFILGRASTIM (PEG fil gra stim) is a long-acting granulocyte colony-stimulating factor that stimulates the growth of neutrophils, a type of white blood cell important in the body's fight against infection. It is used to reduce the incidence of fever and infection in patients with certain types of cancer who are receiving chemotherapy that affects the bone marrow, and to increase survival after being exposed to high doses of radiation.  This medicine may be used for other purposes; ask your health care provider or pharmacist if you have questions.  COMMON BRAND NAME(S): Fulphila, Neulasta, UDENYCA  What should I tell my health care provider before I take this medicine?  They need to know if you have any of these conditions:  -kidney disease  -latex allergy  -ongoing radiation therapy  -sickle cell disease  -skin reactions to acrylic adhesives (On-Body Injector only)  -an unusual or allergic reaction to pegfilgrastim, filgrastim, other medicines, foods, dyes, or preservatives  -pregnant or trying to get pregnant  -breast-feeding  How should I use this medicine?  This medicine is for injection under the skin. If you get this medicine at home, you will be taught how to prepare and give the pre-filled syringe or how to use the On-body Injector. Refer to the patient Instructions for Use for detailed instructions. Use exactly as directed. Tell your healthcare provider immediately if you suspect that the On-body Injector may not have performed as intended or if you suspect the use of the On-body Injector resulted in a missed or partial dose.  It is important that you put your used needles and syringes in a special sharps container. Do not put them in a trash can. If you do not have a sharps container, call your pharmacist or healthcare provider to get one.  Talk to your pediatrician regarding the use of this medicine in children. While this drug may be prescribed for  selected conditions, precautions do apply.  Overdosage: If you think you have taken too much of this medicine contact a poison control center or emergency room at once.  NOTE: This medicine is only for you. Do not share this medicine with others.  What if I miss a dose?  It is important not to miss your dose. Call your doctor or health care professional if you miss your dose. If you miss a dose due to an On-body Injector failure or leakage, a new dose should be administered as soon as possible using a single prefilled syringe for manual use.  What may interact with this medicine?  Interactions have not been studied.  Give your health care provider a list of all the medicines, herbs, non-prescription drugs, or dietary supplements you use. Also tell them if you smoke, drink alcohol, or use illegal drugs. Some items may interact with your medicine.  This list may not describe all possible interactions. Give your health care provider a list of all the medicines, herbs, non-prescription drugs, or dietary supplements you use. Also tell them if you smoke, drink alcohol, or use illegal drugs. Some items may interact with your medicine.  What should I watch for while using this medicine?  You may need blood work done while you are taking this medicine.  If you are going to need a MRI, CT scan, or other procedure, tell your doctor that you are using this medicine (On-Body Injector only).  What side effects may I notice from receiving this medicine?  Side effects that you should report to   your doctor or health care professional as soon as possible:  -allergic reactions like skin rash, itching or hives, swelling of the face, lips, or tongue  -back pain  -dizziness  -fever  -pain, redness, or irritation at site where injected  -pinpoint red spots on the skin  -red or dark-brown urine  -shortness of breath or breathing problems  -stomach or side pain, or pain at the shoulder  -swelling  -tiredness  -trouble passing urine or  change in the amount of urine  Side effects that usually do not require medical attention (report to your doctor or health care professional if they continue or are bothersome):  -bone pain  -muscle pain  This list may not describe all possible side effects. Call your doctor for medical advice about side effects. You may report side effects to FDA at 1-800-FDA-1088.  Where should I keep my medicine?  Keep out of the reach of children.  If you are using this medicine at home, you will be instructed on how to store it. Throw away any unused medicine after the expiration date on the label.  NOTE: This sheet is a summary. It may not cover all possible information. If you have questions about this medicine, talk to your doctor, pharmacist, or health care provider.   2019 Elsevier/Gold Standard (2017-11-12 16:57:08)

## 2018-09-30 NOTE — Assessment & Plan Note (Addendum)
08/23/2018:Right lumpectomy: IDC, 0.7 cm, grade 3, margins negative, 0/3 lymph nodes negative, ER 40%, PR 0%, HER-2 negative, Ki-67 50%, T1BN0 stage Ia  Oncotype Dx 44: High Risk  Treatment plan: 1. Adj Chemo with Dose dense adriamycin and Cytoxan foll by Taxol weekly X 12 2. Adjuvant radiation therapy followed by 3. Adjuvant antiestrogen therapy ------------------------------------------------------------------------------------------------------------ Current Treatment: Cycle 1 day 8 DD AC Chemo Toxicities: 1.  Nausea in spite of antiemetics 2.  Fatigue 3.  Leg cramps  RTC in 1 week for cycle 2

## 2018-10-01 ENCOUNTER — Inpatient Hospital Stay (HOSPITAL_BASED_OUTPATIENT_CLINIC_OR_DEPARTMENT_OTHER): Payer: BLUE CROSS/BLUE SHIELD | Admitting: Hematology and Oncology

## 2018-10-01 ENCOUNTER — Telehealth: Payer: Self-pay | Admitting: Hematology and Oncology

## 2018-10-01 ENCOUNTER — Inpatient Hospital Stay: Payer: BLUE CROSS/BLUE SHIELD

## 2018-10-01 DIAGNOSIS — R5383 Other fatigue: Secondary | ICD-10-CM | POA: Diagnosis not present

## 2018-10-01 DIAGNOSIS — Z170 Estrogen receptor positive status [ER+]: Secondary | ICD-10-CM | POA: Diagnosis not present

## 2018-10-01 DIAGNOSIS — C50311 Malignant neoplasm of lower-inner quadrant of right female breast: Secondary | ICD-10-CM

## 2018-10-01 DIAGNOSIS — R110 Nausea: Secondary | ICD-10-CM | POA: Diagnosis not present

## 2018-10-01 DIAGNOSIS — Z79899 Other long term (current) drug therapy: Secondary | ICD-10-CM

## 2018-10-01 LAB — CBC WITH DIFFERENTIAL (CANCER CENTER ONLY)
Abs Immature Granulocytes: 0.67 10*3/uL — ABNORMAL HIGH (ref 0.00–0.07)
Basophils Absolute: 0 10*3/uL (ref 0.0–0.1)
Basophils Relative: 1 %
Eosinophils Absolute: 0 10*3/uL (ref 0.0–0.5)
Eosinophils Relative: 1 %
HCT: 36.9 % (ref 36.0–46.0)
Hemoglobin: 11.8 g/dL — ABNORMAL LOW (ref 12.0–15.0)
Immature Granulocytes: 13 %
Lymphocytes Relative: 18 %
Lymphs Abs: 1 10*3/uL (ref 0.7–4.0)
MCH: 29.1 pg (ref 26.0–34.0)
MCHC: 32 g/dL (ref 30.0–36.0)
MCV: 91.1 fL (ref 80.0–100.0)
Monocytes Absolute: 0.1 10*3/uL (ref 0.1–1.0)
Monocytes Relative: 2 %
Neutro Abs: 3.4 10*3/uL (ref 1.7–7.7)
Neutrophils Relative %: 65 %
Platelet Count: 122 10*3/uL — ABNORMAL LOW (ref 150–400)
RBC: 4.05 MIL/uL (ref 3.87–5.11)
RDW: 12.9 % (ref 11.5–15.5)
WBC Count: 5.2 10*3/uL (ref 4.0–10.5)
nRBC: 0 % (ref 0.0–0.2)

## 2018-10-01 LAB — CMP (CANCER CENTER ONLY)
ALT: 13 U/L (ref 0–44)
AST: 14 U/L — ABNORMAL LOW (ref 15–41)
Albumin: 3.8 g/dL (ref 3.5–5.0)
Alkaline Phosphatase: 100 U/L (ref 38–126)
Anion gap: 7 (ref 5–15)
BUN: 13 mg/dL (ref 6–20)
CO2: 33 mmol/L — ABNORMAL HIGH (ref 22–32)
Calcium: 9.2 mg/dL (ref 8.9–10.3)
Chloride: 101 mmol/L (ref 98–111)
Creatinine: 0.92 mg/dL (ref 0.44–1.00)
GFR, Est AFR Am: >60 mL/min (ref >60)
GFR, Estimated: >60 mL/min (ref >60)
Glucose, Bld: 97 mg/dL (ref 70–99)
Potassium: 3.7 mmol/L (ref 3.5–5.1)
Sodium: 141 mmol/L (ref 135–145)
Total Bilirubin: 0.8 mg/dL (ref 0.3–1.2)
Total Protein: 7.1 g/dL (ref 6.5–8.1)

## 2018-10-01 MED ORDER — PROMETHAZINE HCL 25 MG PO TABS: 25.0000 mg | ORAL_TABLET | Freq: Four times a day (QID) | ORAL | 3 refills | Status: DC | PRN

## 2018-10-01 NOTE — Telephone Encounter (Signed)
No los °

## 2018-10-03 ENCOUNTER — Encounter: Payer: Self-pay | Admitting: Hematology and Oncology

## 2018-10-03 NOTE — Progress Notes (Signed)
Enrolled patient in copay program for Udenyca through [employer] on 09/19/18.  Received approval letter today. Patient approved for up to $15,000 maximum benefit over 12 months with the earliest claim date of 03/23/18. This will leave her with a $0 copay after insurance pays for each injection.  A copy of the approval letter was mailed to her for her records.  Copy given to [facility] for copay/billing purposes.

## 2018-10-08 ENCOUNTER — Ambulatory Visit: Payer: BLUE CROSS/BLUE SHIELD

## 2018-10-08 ENCOUNTER — Other Ambulatory Visit: Payer: BLUE CROSS/BLUE SHIELD

## 2018-10-08 ENCOUNTER — Ambulatory Visit: Payer: BLUE CROSS/BLUE SHIELD | Admitting: Hematology and Oncology

## 2018-10-08 NOTE — Progress Notes (Signed)
Patient Care Team: [name], [name] Mercy, MD as PCP - General (Family Medicine) [name] Luna, MD as Consulting Physician (General Surgery) [name] Lose, MD as Consulting Physician (Hematology and Oncology) [name] Rudd, MD as Consulting Physician (Radiation Oncology)  DIAGNOSIS:    ICD-10-CM   1. Malignant neoplasm of lower-inner quadrant of right breast of female, estrogen receptor positive ([place]) C50.311    Z17.0     SUMMARY OF ONCOLOGIC HISTORY:   Malignant neoplasm of lower-inner quadrant of right breast of female, estrogen receptor positive ([place])   07/16/2018 Initial Diagnosis    Pain right breast UOQ: Negative on mammogram, incidentally found right breast mass LIQ at 3:30 position measuring 0.6 cm biopsy revealed grade 3 IDC ER 40%, PR 0%, HER-2 negative, Ki-67 50%, T1bN0 stage Ib    07/24/2018 Cancer Staging    Staging form: Breast, AJCC 8th Edition - Clinical: Stage IB (cT1b, cN0, cM0, G3, ER+, PR-, HER2-) - Signed by [name] Lose, MD on 07/24/2018    08/23/2018 Surgery    Right lumpectomy: IDC, 0.7 cm, grade 3, margins negative, 0/3 lymph nodes negative, ER 40%, PR 0%, HER-2 negative, Ki-67 50%, T1BN0 stage Ia    09/04/2018 Cancer Staging    Staging form: Breast, AJCC 8th Edition - Pathologic: Stage IA (pT1b, pN0, cM0, G3, ER+, PR-, HER2-) - Signed by [name] Phlegm, NP on 09/04/2018    09/25/2018 -  Chemotherapy    The patient had DOXOrubicin (ADRIAMYCIN) chemo injection 140 mg, 60 mg/m2 = 140 mg, Intravenous,  Once, 1 of 4 cycles Administration: 140 mg (09/25/2018) palonosetron (ALOXI) injection 0.25 mg, 0.25 mg, Intravenous,  Once, 1 of 4 cycles Administration: 0.25 mg (09/25/2018) pegfilgrastim-cbqv (UDENYCA) injection 6 mg, 6 mg, Subcutaneous, Once, 1 of 4 cycles Administration: 6 mg (09/27/2018) cyclophosphamide (CYTOXAN) 1,400 mg in sodium chloride 0.9 % 250 mL chemo infusion, 600 mg/m2 = 1,400 mg, Intravenous,  Once, 1 of 4 cycles Administration: 1,400 mg  (09/25/2018) PACLitaxel (TAXOL) 186 mg in sodium chloride 0.9 % 250 mL chemo infusion (</= 32m/m2), 80 mg/m2 = 186 mg, Intravenous,  Once, 0 of 12 cycles fosaprepitant (EMEND) 150 mg, dexamethasone (DECADRON) 12 mg in sodium chloride 0.9 % 145 mL IVPB, , Intravenous,  Once, 1 of 4 cycles Administration:  (09/25/2018)  for chemotherapy treatment.      CHIEF COMPLIANT: Cycle 2 Adriamycin and Cytoxan  INTERVAL HISTORY: [name] Brzozowskiis a 53y.o. with above-mentioned history of right breast cancer treated with lumpectomy who is currently on adjuvant chemotherapy with dose dense Adriamycin and Cytoxan. She presents to the clinic today with her daughter and son. She said last week she was nauseous, for which phenergan and compazine are helpful. She reports fatigue, but her appetite has increased. She notes black spots on her tongue and darkening of her skin on hands. She has sensitivity on her scalp and daily headaches, for which she takes Tylenol. Her throat issues have resolved since she stopped drinking lemon water. Her emotions are not stable and anxiety has increased, but she is not currently interested in medication. She denies mouth sores. Her daughter asked about natural remedies to use, like turmeric and CBD oil. Her labs from today show: Hg 11.5, WBC 5.4, platelets 102, ANC 3.3.   REVIEW OF SYSTEMS:   Constitutional: Denies fevers, chills or abnormal weight loss (+) fatigue (+) headache Eyes: Denies blurriness of vision Ears, nose, mouth, throat, and face: Denies mucositis or sore throat (+) black spots on tongue Respiratory: Denies cough, dyspnea  or wheezes Cardiovascular: Denies palpitation, chest discomfort Gastrointestinal: Denies heartburn or change in bowel habits (+) nausea Skin:  (+) darkening of skin on hands  Lymphatics: Denies new lymphadenopathy or easy bruising Neurological: Denies numbness, tingling or new weaknesses Behavioral/Psych: (+) mood swings (+)  anxiety Extremities: No lower extremity edema Breast: denies any pain or lumps or nodules in either breasts All other systems were reviewed with the patient and are negative.  I have reviewed the past medical history, past surgical history, social history and family history with the patient and they are unchanged from previous note.  ALLERGIES:  is allergic to ibuprofen; lisinopril; and dilaudid [hydromorphone hcl].  MEDICATIONS:  Current Outpatient Medications  Medication Sig Dispense Refill  . Cholecalciferol (VITAMIN D3) 5000 UNITS TABS Take 1 tablet by mouth daily.    . clobetasol ointment (TEMOVATE) 7.04 % Apply 1 application topically 2 (two) times daily. Then as needed for 30 days  1  . dexamethasone (DECADRON) 4 MG tablet Take 1 tablet day after chemo and 1 tablet 2 days after chemo with food 8 tablet 0  . Flaxseed, Linseed, (FLAX SEED OIL PO) Take 5 mLs by mouth every morning.    . gabapentin (NEURONTIN) 300 MG capsule Take 1 capsule (300 mg total) by mouth 2 (two) times daily. 20 capsule 1  . lidocaine-prilocaine (EMLA) cream Apply to affected area once 30 g 3  . loratadine (CLARITIN) 10 MG tablet Take 10 mg by mouth daily.    Marland Kitchen LORazepam (ATIVAN) 0.5 MG tablet Take 1 tablet (0.5 mg total) by mouth at bedtime as needed for sleep. 30 tablet 0  . naproxen sodium (ALEVE) 220 MG tablet Take 660 mg by mouth daily as needed (pain).    . Olmesartan-amLODIPine-HCTZ 40-10-25 MG TABS Take 1 tablet by mouth daily.    . ondansetron (ZOFRAN) 8 MG tablet Take 1 tablet (8 mg total) by mouth 2 (two) times daily as needed. Start on the third day after chemotherapy. 30 tablet 1  . oxyCODONE (OXY IR/ROXICODONE) 5 MG immediate release tablet Take 1 tablet (5 mg total) by mouth every 6 (six) hours as needed for severe pain. 10 tablet 0  . prochlorperazine (COMPAZINE) 10 MG tablet Take 1 tablet (10 mg total) by mouth every 6 (six) hours as needed (Nausea or vomiting). 30 tablet 1  . promethazine  (PHENERGAN) 25 MG tablet Take 1 tablet (25 mg total) by mouth every 6 (six) hours as needed for nausea. 30 tablet 3  . tetrahydrozoline-zinc (VISINE-AC) 0.05-0.25 % ophthalmic solution Place 2 drops into both eyes as needed (dry, itchy eyes).     No current facility-administered medications for this visit.     PHYSICAL EXAMINATION: ECOG PERFORMANCE STATUS: 1 - Symptomatic but completely ambulatory  Vitals:   10/09/18 1015  BP: 140/73  Pulse: (!) 109  Resp: 18  Temp: 97.7 F (36.5 C)  SpO2: 100%   Filed Weights   10/09/18 1015  Weight: 252 lb 4.8 oz (114.4 kg)    GENERAL: alert, no distress and comfortable SKIN: skin color, texture, turgor are normal, no rashes or significant lesions EYES: normal, Conjunctiva are pink and non-injected, sclera clear OROPHARYNX: no exudate, no erythema and lips, buccal mucosa, and tongue normal  NECK: supple, thyroid normal size, non-tender, without nodularity LYMPH: no palpable lymphadenopathy in the cervical, axillary or inguinal LUNGS: clear to auscultation and percussion with normal breathing effort HEART: regular rate & rhythm and no murmurs and no lower extremity edema ABDOMEN: abdomen soft, non-tender  and normal bowel sounds MUSCULOSKELETAL: no cyanosis of digits and no clubbing  NEURO: alert & oriented x 3 with fluent speech, no focal motor/sensory deficits EXTREMITIES: No lower extremity edema  LABORATORY DATA:  I have reviewed the data as listed CMP Latest Ref Rng & Units 10/09/2018 10/01/2018 09/25/2018  Glucose 70 - 99 mg/dL 111(H) 97 87  BUN 6 - 20 mg/dL '15 13 14  ' Creatinine 0.44 - 1.00 mg/dL 0.84 0.92 0.91  Sodium 135 - 145 mmol/L 142 141 143  Potassium 3.5 - 5.1 mmol/L 3.3(L) 3.7 3.4(L)  Chloride 98 - 111 mmol/L 106 101 106  CO2 22 - 32 mmol/L 27 33(H) 29  Calcium 8.9 - 10.3 mg/dL 9.2 9.2 9.5  Total Protein 6.5 - 8.1 g/dL 7.2 7.1 7.4  Total Bilirubin 0.3 - 1.2 mg/dL 0.3 0.8 0.3  Alkaline Phos 38 - 126 U/L 98 100 81  AST 15 -  41 U/L 27 14(L) 15  ALT 0 - 44 U/L 46(H) 13 15    Lab Results  Component Value Date   WBC 5.4 10/09/2018   HGB 11.5 (L) 10/09/2018   HCT 35.2 (L) 10/09/2018   MCV 90.3 10/09/2018   PLT 102 (L) 10/09/2018   NEUTROABS 3.3 10/09/2018    ASSESSMENT & PLAN:  Malignant neoplasm of lower-inner quadrant of right breast of female, estrogen receptor positive ([facility]) 08/23/2018:Right lumpectomy: IDC, 0.7 cm, grade 3, margins negative, 0/3 lymph nodes negative, ER 40%, PR 0%, HER-2 negative, Ki-67 50%, T1BN0 stage Ia  Oncotype Dx 44: High Risk  Treatment plan: 1.Adj Chemo with Dose dense adriamycin and Cytoxan foll by Taxol weekly X 12 2. Adjuvant radiation therapy followed by 3. Adjuvant antiestrogen therapy ------------------------------------------------------------------------------------------------------------ Current Treatment: Cycle 2 day 1 dose dense Adriamycin and Cytoxan  Chemo Toxicities: 1.  Nausea in spite of antiemetics: Much improved with Phenergan 2.  Fatigue 3.  Leg cramps 4.  Headaches 5.  Scalp tenderness 6.  Mild thrombocytopenia: Platelet count is adequate for treatment today.  We will continue to watch and monitor this.   RTC in 2 weeks for cycle 3     No orders of the defined types were placed in this encounter.  The patient has a good understanding of the overall plan. she agrees with it. she will call with any problems that may develop before the next visit here.  [name] Lose, MD 10/09/2018  Julious Oka Dorshimer am acting as scribe for Dr. Nicholas [name].  I have reviewed the above documentation for accuracy and completeness, and I agree with the above.

## 2018-10-09 ENCOUNTER — Inpatient Hospital Stay (HOSPITAL_BASED_OUTPATIENT_CLINIC_OR_DEPARTMENT_OTHER): Payer: BLUE CROSS/BLUE SHIELD | Admitting: Hematology and Oncology

## 2018-10-09 ENCOUNTER — Inpatient Hospital Stay: Payer: BLUE CROSS/BLUE SHIELD

## 2018-10-09 VITALS — HR 100

## 2018-10-09 DIAGNOSIS — Z95828 Presence of other vascular implants and grafts: Secondary | ICD-10-CM

## 2018-10-09 DIAGNOSIS — C50311 Malignant neoplasm of lower-inner quadrant of right female breast: Secondary | ICD-10-CM

## 2018-10-09 DIAGNOSIS — R5383 Other fatigue: Secondary | ICD-10-CM

## 2018-10-09 DIAGNOSIS — R110 Nausea: Secondary | ICD-10-CM | POA: Diagnosis not present

## 2018-10-09 DIAGNOSIS — Z170 Estrogen receptor positive status [ER+]: Secondary | ICD-10-CM

## 2018-10-09 DIAGNOSIS — Z79899 Other long term (current) drug therapy: Secondary | ICD-10-CM

## 2018-10-09 DIAGNOSIS — R51 Headache: Secondary | ICD-10-CM

## 2018-10-09 LAB — CMP (CANCER CENTER ONLY)
ALT: 46 U/L — ABNORMAL HIGH (ref 0–44)
AST: 27 U/L (ref 15–41)
Albumin: 3.8 g/dL (ref 3.5–5.0)
Alkaline Phosphatase: 98 U/L (ref 38–126)
Anion gap: 9 (ref 5–15)
BUN: 15 mg/dL (ref 6–20)
CO2: 27 mmol/L (ref 22–32)
Calcium: 9.2 mg/dL (ref 8.9–10.3)
Chloride: 106 mmol/L (ref 98–111)
Creatinine: 0.84 mg/dL (ref 0.44–1.00)
GFR, Est AFR Am: >60 mL/min (ref >60)
GFR, Estimated: >60 mL/min (ref >60)
Glucose, Bld: 111 mg/dL — ABNORMAL HIGH (ref 70–99)
Potassium: 3.3 mmol/L — ABNORMAL LOW (ref 3.5–5.1)
Sodium: 142 mmol/L (ref 135–145)
Total Bilirubin: 0.3 mg/dL (ref 0.3–1.2)
Total Protein: 7.2 g/dL (ref 6.5–8.1)

## 2018-10-09 LAB — CBC WITH DIFFERENTIAL (CANCER CENTER ONLY)
Abs Immature Granulocytes: 0.21 10*3/uL — ABNORMAL HIGH (ref 0.00–0.07)
Basophils Absolute: 0 10*3/uL (ref 0.0–0.1)
Basophils Relative: 0 %
Eosinophils Absolute: 0 10*3/uL (ref 0.0–0.5)
Eosinophils Relative: 0 %
HCT: 35.2 % — ABNORMAL LOW (ref 36.0–46.0)
Hemoglobin: 11.5 g/dL — ABNORMAL LOW (ref 12.0–15.0)
Immature Granulocytes: 4 %
Lymphocytes Relative: 24 %
Lymphs Abs: 1.3 10*3/uL (ref 0.7–4.0)
MCH: 29.5 pg (ref 26.0–34.0)
MCHC: 32.7 g/dL (ref 30.0–36.0)
MCV: 90.3 fL (ref 80.0–100.0)
Monocytes Absolute: 0.6 10*3/uL (ref 0.1–1.0)
Monocytes Relative: 11 %
Neutro Abs: 3.3 10*3/uL (ref 1.7–7.7)
Neutrophils Relative %: 61 %
Platelet Count: 102 10*3/uL — ABNORMAL LOW (ref 150–400)
RBC: 3.9 MIL/uL (ref 3.87–5.11)
RDW: 13.7 % (ref 11.5–15.5)
WBC Count: 5.4 10*3/uL (ref 4.0–10.5)
nRBC: 0.9 % — ABNORMAL HIGH (ref 0.0–0.2)

## 2018-10-09 MED ORDER — SODIUM CHLORIDE 0.9 % IV SOLN
600.0000 mg/m2 | Freq: Once | INTRAVENOUS | Status: AC
  Administered 2018-10-09: 1400 mg via INTRAVENOUS
  Filled 2018-10-09: qty 70

## 2018-10-09 MED ORDER — DOXORUBICIN HCL CHEMO IV INJECTION 2 MG/ML
60.0000 mg/m2 | Freq: Once | INTRAVENOUS | Status: AC
  Administered 2018-10-09: 140 mg via INTRAVENOUS
  Filled 2018-10-09: qty 70

## 2018-10-09 MED ORDER — PALONOSETRON HCL INJECTION 0.25 MG/5ML
0.2500 mg | Freq: Once | INTRAVENOUS | Status: AC
  Administered 2018-10-09: 0.25 mg via INTRAVENOUS

## 2018-10-09 MED ORDER — SODIUM CHLORIDE 0.9% FLUSH
10.0000 mL | INTRAVENOUS | Status: DC | PRN
  Administered 2018-10-09: 10 mL
  Filled 2018-10-09: qty 10

## 2018-10-09 MED ORDER — HEPARIN SOD (PORK) LOCK FLUSH 100 UNIT/ML IV SOLN
500.0000 [IU] | Freq: Once | INTRAVENOUS | Status: AC | PRN
  Administered 2018-10-09: 500 [IU]
  Filled 2018-10-09: qty 5

## 2018-10-09 MED ORDER — SODIUM CHLORIDE 0.9 % IV SOLN
Freq: Once | INTRAVENOUS | Status: AC
  Administered 2018-10-09: 11:00:00 via INTRAVENOUS
  Filled 2018-10-09: qty 5

## 2018-10-09 MED ORDER — SODIUM CHLORIDE 0.9 % IV SOLN
Freq: Once | INTRAVENOUS | Status: AC
  Administered 2018-10-09: 11:00:00 via INTRAVENOUS
  Filled 2018-10-09: qty 250

## 2018-10-09 MED ORDER — SODIUM CHLORIDE 0.9% FLUSH
10.0000 mL | Freq: Once | INTRAVENOUS | Status: AC
  Administered 2018-10-09: 10 mL
  Filled 2018-10-09: qty 10

## 2018-10-09 MED ORDER — PALONOSETRON HCL INJECTION 0.25 MG/5ML
INTRAVENOUS | Status: AC
  Filled 2018-10-09: qty 5

## 2018-10-09 NOTE — Patient Instructions (Addendum)
[facility] Discharge Instructions for Patients Receiving Chemotherapy  Today you received the following chemotherapy agents Adriamycin and Cytoxan  To help prevent nausea and vomiting after your treatment, we encourage you to take your nausea medication as directed.  If you develop nausea and vomiting that is not controlled by your nausea medication, call the clinic.   BELOW ARE SYMPTOMS THAT SHOULD BE REPORTED IMMEDIATELY:  *FEVER GREATER THAN 100.5 F  *CHILLS WITH OR WITHOUT FEVER  NAUSEA AND VOMITING THAT IS NOT CONTROLLED WITH YOUR NAUSEA MEDICATION  *UNUSUAL SHORTNESS OF BREATH  *UNUSUAL BRUISING OR BLEEDING  TENDERNESS IN MOUTH AND THROAT WITH OR WITHOUT PRESENCE OF ULCERS  *URINARY PROBLEMS  *BOWEL PROBLEMS  UNUSUAL RASH Items with * indicate a potential emergency and should be followed up as soon as possible.  Feel free to call the clinic should you have any questions or concerns. The clinic phone number is [phone].  Please show the CHEMO ALERT CARD at check-in to the Emergency Department and triage nurse.   

## 2018-10-09 NOTE — Assessment & Plan Note (Signed)
08/23/2018:Right lumpectomy: IDC, 0.7 cm, grade 3, margins negative, 0/3 lymph nodes negative, ER 40%, PR 0%, HER-2 negative, Ki-67 50%, T1BN0 stage Ia  Oncotype Dx 44: High Risk  Treatment plan: 1.Adj Chemo with Dose dense adriamycin and Cytoxan foll by Taxol weekly X 12 2. Adjuvant radiation therapy followed by 3. Adjuvant antiestrogen therapy ------------------------------------------------------------------------------------------------------------ Current Treatment: Cycle 2 day 1 dose dense Adriamycin and Cytoxan  Chemo Toxicities: 1.  Nausea in spite of antiemetics 2.  Fatigue 3.  Leg cramps  RTC in 2 weeks for cycle 3

## 2018-10-10 ENCOUNTER — Other Ambulatory Visit: Payer: Self-pay

## 2018-10-10 ENCOUNTER — Ambulatory Visit: Payer: BLUE CROSS/BLUE SHIELD

## 2018-10-10 MED ORDER — OMEPRAZOLE 40 MG PO CPDR: 40.0000 mg | DELAYED_RELEASE_CAPSULE | Freq: Every day | ORAL | 0 refills | Status: DC

## 2018-10-10 NOTE — Progress Notes (Signed)
Pt called to request anti reflux medication. Pt was in to see Dr.Gudena yesterday and was told to call if she needed a script. Ok to give prilosec per MD. Will escribe to preferred pharmacy today. Pt verbalized understanding and suggested that she take medication on empty stomach and 30 minutes prior to eating a meal. Pt aware and understands instructions.

## 2018-10-11 ENCOUNTER — Inpatient Hospital Stay: Payer: BLUE CROSS/BLUE SHIELD

## 2018-10-11 VITALS — BP 116/69 | HR 95 | Temp 98.0°F | Resp 18

## 2018-10-11 DIAGNOSIS — C50311 Malignant neoplasm of lower-inner quadrant of right female breast: Secondary | ICD-10-CM

## 2018-10-11 DIAGNOSIS — Z170 Estrogen receptor positive status [ER+]: Secondary | ICD-10-CM

## 2018-10-11 MED ORDER — PEGFILGRASTIM-CBQV 6 MG/0.6ML ~~LOC~~ SOSY
PREFILLED_SYRINGE | SUBCUTANEOUS | Status: AC
  Filled 2018-10-11: qty 0.6

## 2018-10-11 MED ORDER — PEGFILGRASTIM-CBQV 6 MG/0.6ML ~~LOC~~ SOSY
6.0000 mg | PREFILLED_SYRINGE | Freq: Once | SUBCUTANEOUS | Status: AC
  Administered 2018-10-11: 6 mg via SUBCUTANEOUS

## 2018-10-11 NOTE — Patient Instructions (Signed)
Pegfilgrastim injection  What is this medicine?  PEGFILGRASTIM (PEG fil gra stim) is a long-acting granulocyte colony-stimulating factor that stimulates the growth of neutrophils, a type of white blood cell important in the body's fight against infection. It is used to reduce the incidence of fever and infection in patients with certain types of cancer who are receiving chemotherapy that affects the bone marrow, and to increase survival after being exposed to high doses of radiation.  This medicine may be used for other purposes; ask your health care provider or pharmacist if you have questions.  COMMON BRAND NAME(S): Fulphila, Neulasta, UDENYCA  What should I tell my health care provider before I take this medicine?  They need to know if you have any of these conditions:  -kidney disease  -latex allergy  -ongoing radiation therapy  -sickle cell disease  -skin reactions to acrylic adhesives (On-Body Injector only)  -an unusual or allergic reaction to pegfilgrastim, filgrastim, other medicines, foods, dyes, or preservatives  -pregnant or trying to get pregnant  -breast-feeding  How should I use this medicine?  This medicine is for injection under the skin. If you get this medicine at home, you will be taught how to prepare and give the pre-filled syringe or how to use the On-body Injector. Refer to the patient Instructions for Use for detailed instructions. Use exactly as directed. Tell your healthcare provider immediately if you suspect that the On-body Injector may not have performed as intended or if you suspect the use of the On-body Injector resulted in a missed or partial dose.  It is important that you put your used needles and syringes in a special sharps container. Do not put them in a trash can. If you do not have a sharps container, call your pharmacist or healthcare provider to get one.  Talk to your pediatrician regarding the use of this medicine in children. While this drug may be prescribed for  selected conditions, precautions do apply.  Overdosage: If you think you have taken too much of this medicine contact a poison control center or emergency room at once.  NOTE: This medicine is only for you. Do not share this medicine with others.  What if I miss a dose?  It is important not to miss your dose. Call your doctor or health care professional if you miss your dose. If you miss a dose due to an On-body Injector failure or leakage, a new dose should be administered as soon as possible using a single prefilled syringe for manual use.  What may interact with this medicine?  Interactions have not been studied.  Give your health care provider a list of all the medicines, herbs, non-prescription drugs, or dietary supplements you use. Also tell them if you smoke, drink alcohol, or use illegal drugs. Some items may interact with your medicine.  This list may not describe all possible interactions. Give your health care provider a list of all the medicines, herbs, non-prescription drugs, or dietary supplements you use. Also tell them if you smoke, drink alcohol, or use illegal drugs. Some items may interact with your medicine.  What should I watch for while using this medicine?  You may need blood work done while you are taking this medicine.  If you are going to need a MRI, CT scan, or other procedure, tell your doctor that you are using this medicine (On-Body Injector only).  What side effects may I notice from receiving this medicine?  Side effects that you should report to   your doctor or health care professional as soon as possible:  -allergic reactions like skin rash, itching or hives, swelling of the face, lips, or tongue  -back pain  -dizziness  -fever  -pain, redness, or irritation at site where injected  -pinpoint red spots on the skin  -red or dark-brown urine  -shortness of breath or breathing problems  -stomach or side pain, or pain at the shoulder  -swelling  -tiredness  -trouble passing urine or  change in the amount of urine  Side effects that usually do not require medical attention (report to your doctor or health care professional if they continue or are bothersome):  -bone pain  -muscle pain  This list may not describe all possible side effects. Call your doctor for medical advice about side effects. You may report side effects to FDA at 1-800-FDA-1088.  Where should I keep my medicine?  Keep out of the reach of children.  If you are using this medicine at home, you will be instructed on how to store it. Throw away any unused medicine after the expiration date on the label.  NOTE: This sheet is a summary. It may not cover all possible information. If you have questions about this medicine, talk to your doctor, pharmacist, or health care provider.   2019 Elsevier/Gold Standard (2017-11-12 16:57:08)

## 2018-10-22 ENCOUNTER — Ambulatory Visit: Payer: BLUE CROSS/BLUE SHIELD | Admitting: Hematology and Oncology

## 2018-10-22 ENCOUNTER — Other Ambulatory Visit: Payer: BLUE CROSS/BLUE SHIELD

## 2018-10-22 ENCOUNTER — Ambulatory Visit: Payer: BLUE CROSS/BLUE SHIELD

## 2018-10-22 NOTE — Progress Notes (Signed)
Patient Care Team: [name], [name], MD as PCP - General (Family Medicine) Erroll Luna, MD as Consulting Physician (General Surgery) Nicholas Lose, MD as Consulting Physician (Hematology and Oncology) Kyung Rudd, MD as Consulting Physician (Radiation Oncology)  DIAGNOSIS:    ICD-10-CM   1. Malignant neoplasm of lower-inner quadrant of right breast of female, estrogen receptor positive ([facility]) C50.311    Z17.0     SUMMARY OF ONCOLOGIC HISTORY:   Malignant neoplasm of lower-inner quadrant of right breast of female, estrogen receptor positive ([place])   07/16/2018 Initial Diagnosis    Pain right breast UOQ: Negative on mammogram, incidentally found right breast mass LIQ at 3:30 position measuring 0.6 cm biopsy revealed grade 3 IDC ER 40%, PR 0%, HER-2 negative, Ki-67 50%, T1bN0 stage Ib    07/24/2018 Cancer Staging    Staging form: Breast, AJCC 8th Edition - Clinical: Stage IB (cT1b, cN0, cM0, G3, ER+, PR-, HER2-) - Signed by Nicholas Lose, MD on 07/24/2018    08/23/2018 Surgery    Right lumpectomy: IDC, 0.7 cm, grade 3, margins negative, 0/3 lymph nodes negative, ER 40%, PR 0%, HER-2 negative, Ki-67 50%, T1BN0 stage Ia    09/04/2018 Cancer Staging    Staging form: Breast, AJCC 8th Edition - Pathologic: Stage IA (pT1b, pN0, cM0, G3, ER+, PR-, HER2-) - Signed by Gardenia Phlegm, NP on 09/04/2018    09/25/2018 -  Chemotherapy    The patient had DOXOrubicin (ADRIAMYCIN) chemo injection 140 mg, 60 mg/m2 = 140 mg, Intravenous,  Once, 3 of 4 cycles Administration: 140 mg (09/25/2018), 140 mg (10/09/2018) palonosetron (ALOXI) injection 0.25 mg, 0.25 mg, Intravenous,  Once, 3 of 4 cycles Administration: 0.25 mg (09/25/2018), 0.25 mg (10/09/2018) pegfilgrastim-cbqv (UDENYCA) injection 6 mg, 6 mg, Subcutaneous, Once, 3 of 4 cycles Administration: 6 mg (09/27/2018), 6 mg (10/11/2018) cyclophosphamide (CYTOXAN) 1,400 mg in sodium chloride 0.9 % 250 mL chemo infusion, 600 mg/m2 = 1,400 mg,  Intravenous,  Once, 3 of 4 cycles Administration: 1,400 mg (09/25/2018), 1,400 mg (10/09/2018) PACLitaxel (TAXOL) 186 mg in sodium chloride 0.9 % 250 mL chemo infusion (</= 76m/m2), 80 mg/m2 = 186 mg, Intravenous,  Once, 0 of 12 cycles fosaprepitant (EMEND) 150 mg, dexamethasone (DECADRON) 12 mg in sodium chloride 0.9 % 145 mL IVPB, , Intravenous,  Once, 3 of 4 cycles Administration:  (09/25/2018),  (10/09/2018)  for chemotherapy treatment.      CHIEF COMPLIANT: Cycle 3 Adriamycin and Cytoxan  INTERVAL HISTORY: [name] a 53y.o. with above-mentioned history of right breast cancer treated with lumpectomy who is currently on adjuvant chemotherapy with dose dense Adriamycin and Cytoxan. She presents to the clinic alone today and reports soreness and swelling around her port and pain in her right shoulder but denies any redness or warmth at the site. She reports leg cramps. She denies fevers or diarrhea. She reports phenergan improved her nausea considerably and she denies vomiting. Her labs from today show: platelets 107, WBC 6.0, Hg 10.7. She has altered her diet to include more leafy vegetables.    REVIEW OF SYSTEMS:   Constitutional: Denies fevers, chills or abnormal weight loss Eyes: Denies blurriness of vision Ears, nose, mouth, throat, and face: Denies mucositis or sore throat Respiratory: Denies cough, dyspnea or wheezes Cardiovascular: Denies palpitation, chest discomfort Gastrointestinal: Denies nausea, heartburn or change in bowel habits Skin: Denies abnormal skin rashes MSK: (+) right shoulder pain (+) leg cramps Lymphatics: Denies new lymphadenopathy or easy bruising Neurological: Denies numbness, tingling or new  weaknesses Behavioral/Psych: Mood is stable, no new changes  Extremities: No lower extremity edema Breast: denies any lumps or nodules in either breasts (+) irritation, swelling at port site All other systems were reviewed with the patient and are  negative.  I have reviewed the past medical history, past surgical history, social history and family history with the patient and they are unchanged from previous note.  ALLERGIES:  is allergic to ibuprofen; lisinopril; and dilaudid [hydromorphone hcl].  MEDICATIONS:  Current Outpatient Medications  Medication Sig Dispense Refill  . Cholecalciferol (VITAMIN D3) 5000 UNITS TABS Take 1 tablet by mouth daily.    . clobetasol ointment (TEMOVATE) 1.61 % Apply 1 application topically 2 (two) times daily. Then as needed for 30 days  1  . dexamethasone (DECADRON) 4 MG tablet Take 1 tablet day after chemo and 1 tablet 2 days after chemo with food 8 tablet 0  . Flaxseed, Linseed, (FLAX SEED OIL PO) Take 5 mLs by mouth every morning.    . gabapentin (NEURONTIN) 300 MG capsule Take 1 capsule (300 mg total) by mouth 2 (two) times daily. 20 capsule 1  . lidocaine-prilocaine (EMLA) cream Apply to affected area once 30 g 3  . loratadine (CLARITIN) 10 MG tablet Take 10 mg by mouth daily.    Marland Kitchen LORazepam (ATIVAN) 0.5 MG tablet Take 1 tablet (0.5 mg total) by mouth at bedtime as needed for sleep. 30 tablet 0  . naproxen sodium (ALEVE) 220 MG tablet Take 660 mg by mouth daily as needed (pain).    . Olmesartan-amLODIPine-HCTZ 40-10-25 MG TABS Take 1 tablet by mouth daily.    Marland Kitchen omeprazole (PRILOSEC) 40 MG capsule Take 1 capsule (40 mg total) by mouth daily. 30 capsule 0  . ondansetron (ZOFRAN) 8 MG tablet Take 1 tablet (8 mg total) by mouth 2 (two) times daily as needed. Start on the third day after chemotherapy. 30 tablet 1  . oxyCODONE (OXY IR/ROXICODONE) 5 MG immediate release tablet Take 1 tablet (5 mg total) by mouth every 6 (six) hours as needed for severe pain. 10 tablet 0  . prochlorperazine (COMPAZINE) 10 MG tablet Take 1 tablet (10 mg total) by mouth every 6 (six) hours as needed (Nausea or vomiting). 30 tablet 1  . promethazine (PHENERGAN) 25 MG tablet Take 1 tablet (25 mg total) by mouth every 6 (six)  hours as needed for nausea. 30 tablet 3  . tetrahydrozoline-zinc (VISINE-AC) 0.05-0.25 % ophthalmic solution Place 2 drops into both eyes as needed (dry, itchy eyes).     No current facility-administered medications for this visit.    Facility-Administered Medications Ordered in Other Visits  Medication Dose Route Frequency Provider Last Rate Last Dose  . cyclophosphamide (CYTOXAN) 1,400 mg in sodium chloride 0.9 % 250 mL chemo infusion  600 mg/m2 (Treatment Plan Recorded) Intravenous Once Nicholas Lose, MD      . DOXOrubicin (ADRIAMYCIN) chemo injection 140 mg  60 mg/m2 (Treatment Plan Recorded) Intravenous Once Nicholas Lose, MD      . fosaprepitant (EMEND) 150 mg, dexamethasone (DECADRON) 12 mg in sodium chloride 0.9 % 145 mL IVPB   Intravenous Once Nicholas Lose, MD 454 mL/hr at 10/23/18 0947    . heparin lock flush 100 unit/mL  500 Units Intracatheter Once PRN Nicholas Lose, MD      . sodium chloride flush (NS) 0.9 % injection 10 mL  10 mL Intracatheter PRN Nicholas Lose, MD        PHYSICAL EXAMINATION: ECOG PERFORMANCE STATUS: 1 - Symptomatic  but completely ambulatory  Vitals:   10/23/18 0843  BP: (!) 147/79  Pulse: 73  Resp: 17  Temp: 98.9 F (37.2 C)  SpO2: 100%   Filed Weights   10/23/18 0843  Weight: 253 lb 3.2 oz (114.9 kg)    GENERAL: alert, no distress and comfortable SKIN: skin color, texture, turgor are normal, no rashes or significant lesions EYES: normal, Conjunctiva are pink and non-injected, sclera clear OROPHARYNX: no exudate, no erythema and lips, buccal mucosa, and tongue normal  NECK: supple, thyroid normal size, non-tender, without nodularity LYMPH: no palpable lymphadenopathy in the cervical, axillary or inguinal LUNGS: clear to auscultation and percussion with normal breathing effort HEART: regular rate & rhythm and no murmurs and no lower extremity edema ABDOMEN: abdomen soft, non-tender and normal bowel sounds MUSCULOSKELETAL: no cyanosis of digits  and no clubbing  NEURO: alert & oriented x 3 with fluent speech, no focal motor/sensory deficits EXTREMITIES: No lower extremity edema  LABORATORY DATA:  I have reviewed the data as listed CMP Latest Ref Rng & Units 10/23/2018 10/09/2018 10/01/2018  Glucose 70 - 99 mg/dL 107(H) 111(H) 97  BUN 6 - 20 mg/dL _0 Creatinine 0.44 - 1.00 mg/dL 0.90 0.84 0.92  Sodium 135 - 145 mmol/L 140 142 141  Potassium 3.5 - 5.1 mmol/L 3.6 3.3(L) 3.7  Chloride 98 - 111 mmol/L 105 106 101  CO2 22 - 32 mmol/L 26 27 33(H)  Calcium 8.9 - 10.3 mg/dL 9.1 9.2 9.2  Total Protein 6.5 - 8.1 g/dL 7.1 7.2 7.1  Total Bilirubin 0.3 - 1.2 mg/dL 0.4 0.3 0.8  Alkaline Phos 38 - 126 U/L 103 98 100  AST 15 - 41 U/L 25 27 14(L)  ALT 0 - 44 U/L 28 46(H) 13    Lab Results  Component Value Date   WBC 6.0 10/23/2018   HGB 10.7 (L) 10/23/2018   HCT 32.9 (L) 10/23/2018   MCV 91.1 10/23/2018   PLT 107 (L) 10/23/2018   NEUTROABS 3.9 10/23/2018    ASSESSMENT & PLAN:  Malignant neoplasm of lower-inner quadrant of right breast of female, estrogen receptor positive ([facility]) 08/23/2018:Right lumpectomy: IDC, 0.7 cm, grade 3, margins negative, 0/3 lymph nodes negative, ER 40%, PR 0%, HER-2 negative, Ki-67 50%, T1BN0 stage Ia  Oncotype Dx 44: High Risk  Treatment plan: 1.Adj Chemo with Dose dense adriamycin and Cytoxan foll by Taxol weekly X 12 2. Adjuvant radiation therapy followed by 3. Adjuvant antiestrogen therapy ------------------------------------------------------------------------------------------------------------ Current Treatment: Cycle 3 day 1 dose dense Adriamycin and Cytoxan  Chemo Toxicities: 1.Nausea in spite of antiemetics: Much improved with Phenergan 2.Fatigue 3.Leg cramps 4.  Headaches 5.  Scalp tenderness 6.  Mild thrombocytopenia: Platelet count is adequate for treatment today.  We will continue to watch and monitor this.   RTC in 2 weeks for cycle 4    No orders of the defined  types were placed in this encounter.  The patient has a good understanding of the overall plan. she agrees with it. she will call with any problems that may develop before the next visit here.  Nicholas Lose, MD 10/23/2018  Julious Oka Dorshimer am acting as scribe for Dr. Nicholas Lose.  I have reviewed the above documentation for accuracy and completeness, and I agree with the above.

## 2018-10-23 ENCOUNTER — Inpatient Hospital Stay: Payer: BLUE CROSS/BLUE SHIELD

## 2018-10-23 ENCOUNTER — Inpatient Hospital Stay: Payer: BLUE CROSS/BLUE SHIELD | Attending: Hematology and Oncology

## 2018-10-23 ENCOUNTER — Inpatient Hospital Stay (HOSPITAL_BASED_OUTPATIENT_CLINIC_OR_DEPARTMENT_OTHER): Payer: BLUE CROSS/BLUE SHIELD | Admitting: Hematology and Oncology

## 2018-10-23 DIAGNOSIS — J029 Acute pharyngitis, unspecified: Secondary | ICD-10-CM | POA: Diagnosis not present

## 2018-10-23 DIAGNOSIS — Z79899 Other long term (current) drug therapy: Secondary | ICD-10-CM | POA: Insufficient documentation

## 2018-10-23 DIAGNOSIS — Z170 Estrogen receptor positive status [ER+]: Secondary | ICD-10-CM | POA: Insufficient documentation

## 2018-10-23 DIAGNOSIS — C50311 Malignant neoplasm of lower-inner quadrant of right female breast: Secondary | ICD-10-CM | POA: Insufficient documentation

## 2018-10-23 DIAGNOSIS — Z5189 Encounter for other specified aftercare: Secondary | ICD-10-CM | POA: Insufficient documentation

## 2018-10-23 DIAGNOSIS — Z95828 Presence of other vascular implants and grafts: Secondary | ICD-10-CM

## 2018-10-23 DIAGNOSIS — D696 Thrombocytopenia, unspecified: Secondary | ICD-10-CM | POA: Insufficient documentation

## 2018-10-23 DIAGNOSIS — I10 Essential (primary) hypertension: Secondary | ICD-10-CM | POA: Insufficient documentation

## 2018-10-23 DIAGNOSIS — Z5111 Encounter for antineoplastic chemotherapy: Secondary | ICD-10-CM | POA: Diagnosis present

## 2018-10-23 LAB — CMP (CANCER CENTER ONLY)
ALT: 28 U/L (ref 0–44)
AST: 25 U/L (ref 15–41)
Albumin: 3.8 g/dL (ref 3.5–5.0)
Alkaline Phosphatase: 103 U/L (ref 38–126)
Anion gap: 9 (ref 5–15)
BUN: 11 mg/dL (ref 6–20)
CO2: 26 mmol/L (ref 22–32)
Calcium: 9.1 mg/dL (ref 8.9–10.3)
Chloride: 105 mmol/L (ref 98–111)
Creatinine: 0.9 mg/dL (ref 0.44–1.00)
GFR, Est AFR Am: >60 mL/min (ref >60)
GFR, Estimated: >60 mL/min (ref >60)
Glucose, Bld: 107 mg/dL — ABNORMAL HIGH (ref 70–99)
Potassium: 3.6 mmol/L (ref 3.5–5.1)
Sodium: 140 mmol/L (ref 135–145)
Total Bilirubin: 0.4 mg/dL (ref 0.3–1.2)
Total Protein: 7.1 g/dL (ref 6.5–8.1)

## 2018-10-23 LAB — CBC WITH DIFFERENTIAL (CANCER CENTER ONLY)
Abs Immature Granulocytes: 0.36 10*3/uL — ABNORMAL HIGH (ref 0.00–0.07)
Basophils Absolute: 0 10*3/uL (ref 0.0–0.1)
Basophils Relative: 1 %
Eosinophils Absolute: 0 10*3/uL (ref 0.0–0.5)
Eosinophils Relative: 0 %
HCT: 32.9 % — ABNORMAL LOW (ref 36.0–46.0)
Hemoglobin: 10.7 g/dL — ABNORMAL LOW (ref 12.0–15.0)
Immature Granulocytes: 6 %
Lymphocytes Relative: 15 %
Lymphs Abs: 0.9 10*3/uL (ref 0.7–4.0)
MCH: 29.6 pg (ref 26.0–34.0)
MCHC: 32.5 g/dL (ref 30.0–36.0)
MCV: 91.1 fL (ref 80.0–100.0)
Monocytes Absolute: 0.7 10*3/uL (ref 0.1–1.0)
Monocytes Relative: 12 %
Neutro Abs: 3.9 10*3/uL (ref 1.7–7.7)
Neutrophils Relative %: 66 %
Platelet Count: 107 10*3/uL — ABNORMAL LOW (ref 150–400)
RBC: 3.61 MIL/uL — ABNORMAL LOW (ref 3.87–5.11)
RDW: 14.4 % (ref 11.5–15.5)
WBC Count: 6 10*3/uL (ref 4.0–10.5)
nRBC: 0.8 % — ABNORMAL HIGH (ref 0.0–0.2)

## 2018-10-23 MED ORDER — SODIUM CHLORIDE 0.9% FLUSH
10.0000 mL | Freq: Once | INTRAVENOUS | Status: AC
  Administered 2018-10-23: 10 mL
  Filled 2018-10-23: qty 10

## 2018-10-23 MED ORDER — SODIUM CHLORIDE 0.9 % IV SOLN
600.0000 mg/m2 | Freq: Once | INTRAVENOUS | Status: AC
  Administered 2018-10-23: 1400 mg via INTRAVENOUS
  Filled 2018-10-23: qty 70

## 2018-10-23 MED ORDER — HEPARIN SOD (PORK) LOCK FLUSH 100 UNIT/ML IV SOLN
500.0000 [IU] | Freq: Once | INTRAVENOUS | Status: AC | PRN
  Administered 2018-10-23: 500 [IU]
  Filled 2018-10-23: qty 5

## 2018-10-23 MED ORDER — SODIUM CHLORIDE 0.9 % IV SOLN
Freq: Once | INTRAVENOUS | Status: AC
  Administered 2018-10-23: 09:00:00 via INTRAVENOUS
  Filled 2018-10-23: qty 250

## 2018-10-23 MED ORDER — SODIUM CHLORIDE 0.9% FLUSH
10.0000 mL | INTRAVENOUS | Status: DC | PRN
  Administered 2018-10-23: 10 mL
  Filled 2018-10-23: qty 10

## 2018-10-23 MED ORDER — PALONOSETRON HCL INJECTION 0.25 MG/5ML
0.2500 mg | Freq: Once | INTRAVENOUS | Status: AC
  Administered 2018-10-23: 0.25 mg via INTRAVENOUS

## 2018-10-23 MED ORDER — SODIUM CHLORIDE 0.9 % IV SOLN
Freq: Once | INTRAVENOUS | Status: AC
  Administered 2018-10-23: 10:00:00 via INTRAVENOUS
  Filled 2018-10-23: qty 5

## 2018-10-23 MED ORDER — PALONOSETRON HCL INJECTION 0.25 MG/5ML
INTRAVENOUS | Status: AC
  Filled 2018-10-23: qty 5

## 2018-10-23 MED ORDER — DOXORUBICIN HCL CHEMO IV INJECTION 2 MG/ML
60.0000 mg/m2 | Freq: Once | INTRAVENOUS | Status: AC
  Administered 2018-10-23: 140 mg via INTRAVENOUS
  Filled 2018-10-23: qty 70

## 2018-10-23 NOTE — Assessment & Plan Note (Signed)
08/23/2018:Right lumpectomy: IDC, 0.7 cm, grade 3, margins negative, 0/3 lymph nodes negative, ER 40%, PR 0%, HER-2 negative, Ki-67 50%, T1BN0 stage Ia  Oncotype Dx 44: High Risk  Treatment plan: 1.Adj Chemo with Dose dense adriamycin and Cytoxan foll by Taxol weekly X 12 2. Adjuvant radiation therapy followed by 3. Adjuvant antiestrogen therapy ------------------------------------------------------------------------------------------------------------ Current Treatment: Cycle 3 day 1 dose dense Adriamycin and Cytoxan  Chemo Toxicities: 1.Nausea in spite of antiemetics: Much improved with Phenergan 2.Fatigue 3.Leg cramps 4.  Headaches 5.  Scalp tenderness 6.  Mild thrombocytopenia: Platelet count is adequate for treatment today.  We will continue to watch and monitor this.   RTC in 2 weeks for cycle 4

## 2018-10-23 NOTE — Patient Instructions (Signed)
[facility] Discharge Instructions for Patients Receiving Chemotherapy  Today you received the following chemotherapy agents Adriamycin and Cytoxan  To help prevent nausea and vomiting after your treatment, we encourage you to take your nausea medication as directed.  If you develop nausea and vomiting that is not controlled by your nausea medication, call the clinic.   BELOW ARE SYMPTOMS THAT SHOULD BE REPORTED IMMEDIATELY:  *FEVER GREATER THAN 100.5 F  *CHILLS WITH OR WITHOUT FEVER  NAUSEA AND VOMITING THAT IS NOT CONTROLLED WITH YOUR NAUSEA MEDICATION  *UNUSUAL SHORTNESS OF BREATH  *UNUSUAL BRUISING OR BLEEDING  TENDERNESS IN MOUTH AND THROAT WITH OR WITHOUT PRESENCE OF ULCERS  *URINARY PROBLEMS  *BOWEL PROBLEMS  UNUSUAL RASH Items with * indicate a potential emergency and should be followed up as soon as possible.  Feel free to call the clinic should you have any questions or concerns. The clinic phone number is [phone].  Please show the CHEMO ALERT CARD at check-in to the Emergency Department and triage nurse.   

## 2018-10-24 ENCOUNTER — Inpatient Hospital Stay: Payer: BLUE CROSS/BLUE SHIELD

## 2018-10-24 VITALS — BP 133/66 | HR 100 | Temp 98.2°F | Resp 18

## 2018-10-24 DIAGNOSIS — C50311 Malignant neoplasm of lower-inner quadrant of right female breast: Secondary | ICD-10-CM

## 2018-10-24 DIAGNOSIS — Z170 Estrogen receptor positive status [ER+]: Secondary | ICD-10-CM

## 2018-10-24 MED ORDER — PEGFILGRASTIM-CBQV 6 MG/0.6ML ~~LOC~~ SOSY
PREFILLED_SYRINGE | SUBCUTANEOUS | Status: AC
  Filled 2018-10-24: qty 0.6

## 2018-10-24 MED ORDER — PEGFILGRASTIM-CBQV 6 MG/0.6ML ~~LOC~~ SOSY
6.0000 mg | PREFILLED_SYRINGE | Freq: Once | SUBCUTANEOUS | Status: AC
  Administered 2018-10-24: 6 mg via SUBCUTANEOUS

## 2018-10-24 NOTE — Patient Instructions (Signed)
Pegfilgrastim injection  What is this medicine?  PEGFILGRASTIM (PEG fil gra stim) is a long-acting granulocyte colony-stimulating factor that stimulates the growth of neutrophils, a type of white blood cell important in the body's fight against infection. It is used to reduce the incidence of fever and infection in patients with certain types of cancer who are receiving chemotherapy that affects the bone marrow, and to increase survival after being exposed to high doses of radiation.  This medicine may be used for other purposes; ask your health care provider or pharmacist if you have questions.  COMMON BRAND NAME(S): Fulphila, Neulasta, UDENYCA  What should I tell my health care provider before I take this medicine?  They need to know if you have any of these conditions:  -kidney disease  -latex allergy  -ongoing radiation therapy  -sickle cell disease  -skin reactions to acrylic adhesives (On-Body Injector only)  -an unusual or allergic reaction to pegfilgrastim, filgrastim, other medicines, foods, dyes, or preservatives  -pregnant or trying to get pregnant  -breast-feeding  How should I use this medicine?  This medicine is for injection under the skin. If you get this medicine at home, you will be taught how to prepare and give the pre-filled syringe or how to use the On-body Injector. Refer to the patient Instructions for Use for detailed instructions. Use exactly as directed. Tell your healthcare provider immediately if you suspect that the On-body Injector may not have performed as intended or if you suspect the use of the On-body Injector resulted in a missed or partial dose.  It is important that you put your used needles and syringes in a special sharps container. Do not put them in a trash can. If you do not have a sharps container, call your pharmacist or healthcare provider to get one.  Talk to your pediatrician regarding the use of this medicine in children. While this drug may be prescribed for  selected conditions, precautions do apply.  Overdosage: If you think you have taken too much of this medicine contact a poison control center or emergency room at once.  NOTE: This medicine is only for you. Do not share this medicine with others.  What if I miss a dose?  It is important not to miss your dose. Call your doctor or health care professional if you miss your dose. If you miss a dose due to an On-body Injector failure or leakage, a new dose should be administered as soon as possible using a single prefilled syringe for manual use.  What may interact with this medicine?  Interactions have not been studied.  Give your health care provider a list of all the medicines, herbs, non-prescription drugs, or dietary supplements you use. Also tell them if you smoke, drink alcohol, or use illegal drugs. Some items may interact with your medicine.  This list may not describe all possible interactions. Give your health care provider a list of all the medicines, herbs, non-prescription drugs, or dietary supplements you use. Also tell them if you smoke, drink alcohol, or use illegal drugs. Some items may interact with your medicine.  What should I watch for while using this medicine?  You may need blood work done while you are taking this medicine.  If you are going to need a MRI, CT scan, or other procedure, tell your doctor that you are using this medicine (On-Body Injector only).  What side effects may I notice from receiving this medicine?  Side effects that you should report to   your doctor or health care professional as soon as possible:  -allergic reactions like skin rash, itching or hives, swelling of the face, lips, or tongue  -back pain  -dizziness  -fever  -pain, redness, or irritation at site where injected  -pinpoint red spots on the skin  -red or dark-brown urine  -shortness of breath or breathing problems  -stomach or side pain, or pain at the shoulder  -swelling  -tiredness  -trouble passing urine or  change in the amount of urine  Side effects that usually do not require medical attention (report to your doctor or health care professional if they continue or are bothersome):  -bone pain  -muscle pain  This list may not describe all possible side effects. Call your doctor for medical advice about side effects. You may report side effects to FDA at 1-800-FDA-1088.  Where should I keep my medicine?  Keep out of the reach of children.  If you are using this medicine at home, you will be instructed on how to store it. Throw away any unused medicine after the expiration date on the label.  NOTE: This sheet is a summary. It may not cover all possible information. If you have questions about this medicine, talk to your doctor, pharmacist, or health care provider.   2019 Elsevier/Gold Standard (2017-11-12 16:57:08)

## 2018-10-28 ENCOUNTER — Telehealth: Payer: Self-pay

## 2018-10-28 NOTE — Telephone Encounter (Signed)
Patient called to report "knot on back".  Pt reports noticed X 8 weeks ago.  Pt denies any redness, swelling, or drainage.  Pt just concerned and reports mild soreness.   Nurse encouraged patient to monitor for changes such as redness, drainage, fever, or increase in size.  MD appointment on 3/18, MD can assess at that time.  Encouraged to call with changes prior to appointment.  Pt voiced understanding and agreement.

## 2018-10-30 ENCOUNTER — Telehealth: Payer: Self-pay | Admitting: General Practice

## 2018-10-30 ENCOUNTER — Telehealth: Payer: Self-pay

## 2018-10-30 NOTE — Telephone Encounter (Signed)
[facility] CSW Progress Notes  Request from Nurse Navigator to schedule appt to talk w patient - called and scheduled time for 3/12 at 1 PM.  Patient states "[name] [name] got to talk to someone, I am just depressed all the time."    Edwyna Shell, [place] Worker Phone:  [phone]

## 2018-10-30 NOTE — Telephone Encounter (Signed)
Pt called with complaints of weakness and fatigue. Pt is 1 week post chemo. Told pt that moderate to severe fatigue/weakness can happen 1 week after chemo due to low counts. Pt states that she is doing well with hydration and eating. Denies any nausea or vomiting, but does have some mild indigestion. Suggested taking tums for indigestion. Pt reports sweating as well, but denies any current fevers. Advised pt to check her temperature throughout the day at least 2-3x to monitor for any signs of fever or infection. Pt verbalized understanding.   Suggested that pt continue to increase hydration, monitor for fevers, cough, sob and notify MD if these symptoms worsen. Pt also advised to stay away from public places and wear a mask for protection. Pt verbalized understanding. She will call tomorrow if she needs to be seen with symptom management.

## 2018-10-31 ENCOUNTER — Other Ambulatory Visit: Payer: Self-pay

## 2018-10-31 ENCOUNTER — Encounter: Payer: Self-pay | Admitting: General Practice

## 2018-10-31 ENCOUNTER — Inpatient Hospital Stay: Payer: BLUE CROSS/BLUE SHIELD | Admitting: General Practice
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
## 2018-11-05 NOTE — Progress Notes (Signed)
Patient Care Team: [name], [name], MD as PCP - General (Family Medicine) Erroll Luna, MD as Consulting Physician (General Surgery) Nicholas Lose, MD as Consulting Physician (Hematology and Oncology) Kyung Rudd, MD as Consulting Physician (Radiation Oncology)  DIAGNOSIS:    ICD-10-CM   1. Malignant neoplasm of lower-inner quadrant of right breast of female, estrogen receptor positive ([place]) C50.311    Z17.0     SUMMARY OF ONCOLOGIC HISTORY:   Malignant neoplasm of lower-inner quadrant of right breast of female, estrogen receptor positive ([place])   07/16/2018 Initial Diagnosis    Pain right breast UOQ: Negative on mammogram, incidentally found right breast mass LIQ at 3:30 position measuring 0.6 cm biopsy revealed grade 3 IDC ER 40%, PR 0%, HER-2 negative, Ki-67 50%, T1bN0 stage Ib    07/24/2018 Cancer Staging    Staging form: Breast, AJCC 8th Edition - Clinical: Stage IB (cT1b, cN0, cM0, G3, ER+, PR-, HER2-) - Signed by Nicholas Lose, MD on 07/24/2018    08/23/2018 Surgery    Right lumpectomy: IDC, 0.7 cm, grade 3, margins negative, 0/3 lymph nodes negative, ER 40%, PR 0%, HER-2 negative, Ki-67 50%, T1BN0 stage Ia    09/04/2018 Cancer Staging    Staging form: Breast, AJCC 8th Edition - Pathologic: Stage IA (pT1b, pN0, cM0, G3, ER+, PR-, HER2-) - Signed by Gardenia Phlegm, NP on 09/04/2018    09/25/2018 -  Chemotherapy    Adjuvant chemotherapy with dose dense Adriamycin and Cytoxan x4 followed by Taxol weekly x12.       CHIEF COMPLIANT: Cycle 4 Adriamycin and Cytoxan  INTERVAL HISTORY: [name] [name] is a 53 y.o. with above-mentioned history of right breast cancer treated with lumpectomywho is currently on adjuvant chemotherapy with dose dense Adriamycin and Cytoxan.She presents to the clinic alone today and notes her last treatment was difficult. She notes fatigue, weakness, excessive sweating, burning in her nose and head, and nausea that lasted for a week  after treatment despite taking Zofran. Her symptoms began to improve one week after treatment. She notes a red bump on her back that is tender. Her labs from today show: WBC 6.9, Hg 10.4, platelets 106. She reviewed her medication list with me.   REVIEW OF SYSTEMS:   Constitutional: Denies fevers, chills or abnormal weight loss (+) fatigue (+) weakness (+) excessive sweating Eyes: Denies blurriness of vision Ears, nose, mouth, throat, and face: Denies mucositis or sore throat (+) burning in nose Respiratory: Denies cough, dyspnea or wheezes Cardiovascular: Denies palpitation, chest discomfort Gastrointestinal: Denies heartburn or change in bowel habits (+) nausea Skin: Denies abnormal skin rashes (+) red bump on back Lymphatics: Denies new lymphadenopathy or easy bruising Neurological: Denies numbness, tingling or new weaknesses Behavioral/Psych: Mood is stable, no new changes  Extremities: No lower extremity edema Breast: denies any pain or lumps or nodules in either breasts All other systems were reviewed with the patient and are negative.  I have reviewed the past medical history, past surgical history, social history and family history with the patient and they are unchanged from previous note.  ALLERGIES:  is allergic to ibuprofen; lisinopril; and dilaudid [hydromorphone hcl].  MEDICATIONS:  Current Outpatient Medications  Medication Sig Dispense Refill  . Cholecalciferol (VITAMIN D3) 5000 UNITS TABS Take 1 tablet by mouth daily.    . clobetasol ointment (TEMOVATE) 2.23 % Apply 1 application topically 2 (two) times daily. Then as needed for 30 days  1  . dexamethasone (DECADRON) 4 MG tablet Take 1 tablet day  after chemo and 1 tablet 2 days after chemo with food 8 tablet 0  . Flaxseed, Linseed, (FLAX SEED OIL PO) Take 5 mLs by mouth every morning.    . gabapentin (NEURONTIN) 300 MG capsule Take 1 capsule (300 mg total) by mouth 2 (two) times daily. 20 capsule 1  .  lidocaine-prilocaine (EMLA) cream Apply to affected area once 30 g 3  . loratadine (CLARITIN) 10 MG tablet Take 10 mg by mouth daily.    Marland Kitchen LORazepam (ATIVAN) 0.5 MG tablet Take 1 tablet (0.5 mg total) by mouth at bedtime as needed for sleep. 30 tablet 0  . naproxen sodium (ALEVE) 220 MG tablet Take 660 mg by mouth daily as needed (pain).    . Olmesartan-amLODIPine-HCTZ 40-10-25 MG TABS Take 1 tablet by mouth daily.    Marland Kitchen omeprazole (PRILOSEC) 40 MG capsule Take 1 capsule (40 mg total) by mouth daily. 30 capsule 0  . ondansetron (ZOFRAN) 8 MG tablet Take 1 tablet (8 mg total) by mouth 2 (two) times daily as needed. Start on the third day after chemotherapy. 30 tablet 1  . prochlorperazine (COMPAZINE) 10 MG tablet Take 1 tablet (10 mg total) by mouth every 6 (six) hours as needed (Nausea or vomiting). 30 tablet 1  . promethazine (PHENERGAN) 25 MG tablet Take 1 tablet (25 mg total) by mouth every 6 (six) hours as needed for nausea. 30 tablet 3  . tetrahydrozoline-zinc (VISINE-AC) 0.05-0.25 % ophthalmic solution Place 2 drops into both eyes as needed (dry, itchy eyes).     No current facility-administered medications for this visit.     PHYSICAL EXAMINATION: ECOG PERFORMANCE STATUS: 1 - Symptomatic but completely ambulatory  Vitals:   11/06/18 0848  BP: 122/78  Pulse: (!) 109  Resp: 18  Temp: 98.6 F (37 C)  SpO2: 100%   Filed Weights   11/06/18 0848  Weight: 250 lb 1.6 oz (113.4 kg)    GENERAL: alert, no distress and comfortable SKIN: skin color, texture, turgor are normal, no rashes or significant lesions EYES: normal, Conjunctiva are pink and non-injected, sclera clear OROPHARYNX: no exudate, no erythema and lips, buccal mucosa, and tongue normal  NECK: supple, thyroid normal size, non-tender, without nodularity LYMPH: no palpable lymphadenopathy in the cervical, axillary or inguinal LUNGS: clear to auscultation and percussion with normal breathing effort HEART: regular rate &  rhythm and no murmurs and no lower extremity edema ABDOMEN: abdomen soft, non-tender and normal bowel sounds MUSCULOSKELETAL: no cyanosis of digits and no clubbing  NEURO: alert & oriented x 3 with fluent speech, no focal motor/sensory deficits EXTREMITIES: No lower extremity edema  LABORATORY DATA:  I have reviewed the data as listed CMP Latest Ref Rng & Units 11/06/2018 10/23/2018 10/09/2018  Glucose 70 - 99 mg/dL 111(H) 107(H) 111(H)  BUN 6 - 20 mg/dL _0 Creatinine 0.44 - 1.00 mg/dL 0.84 0.90 0.84  Sodium 135 - 145 mmol/L 142 140 142  Potassium 3.5 - 5.1 mmol/L 3.2(L) 3.6 3.3(L)  Chloride 98 - 111 mmol/L 105 105 106  CO2 22 - 32 mmol/L _1 Calcium 8.9 - 10.3 mg/dL 9.2 9.1 9.2  Total Protein 6.5 - 8.1 g/dL 7.1 7.1 7.2  Total Bilirubin 0.3 - 1.2 mg/dL 0.3 0.4 0.3  Alkaline Phos 38 - 126 U/L 77 103 98  AST 15 - 41 U/L _2 ALT 0 - 44 U/L 16 28 46(H)    Lab Results  Component Value Date   WBC  6.9 11/06/2018   HGB 10.4 (L) 11/06/2018   HCT 32.0 (L) 11/06/2018   MCV 93.8 11/06/2018   PLT 106 (L) 11/06/2018   NEUTROABS 4.9 11/06/2018    ASSESSMENT & PLAN:  Malignant neoplasm of lower-inner quadrant of right breast of female, estrogen receptor positive ([facility]) 08/23/2018:Right lumpectomy: IDC, 0.7 cm, grade 3, margins negative, 0/3 lymph nodes negative, ER 40%, PR 0%, HER-2 negative, Ki-67 50%, T1BN0 stage Ia  Oncotype Dx 44: High Risk  Treatment plan: 1.Adj Chemo with Dose dense adriamycin and Cytoxan foll by Taxol weekly X 12 2. Adjuvant radiation therapy followed by 3. Adjuvant antiestrogen therapy ------------------------------------------------------------------------------------------------------------ Current Treatment: Cycle4day 1dose dense Adriamycin and Cytoxan  Chemo Toxicities: 1.Nausea in spite of antiemetics: Reduce the dosage of chemo for cycle 4 2.Fatigue: Profound fatigue 3.Leg cramps 4.Headaches 5.Scalp tenderness 6.Mild  thrombocytopenia: Platelet count is adequate for treatment today. We will continue to watch and monitor this.   RTC in2weeksfor cycle 1 Taxol    No orders of the defined types were placed in this encounter.  The patient has a good understanding of the overall plan. she agrees with it. she will call with any problems that may develop before the next visit here.  Nicholas Lose, MD 11/06/2018  Julious Oka Dorshimer am acting as scribe for Dr. Nicholas Lose.  I have reviewed the above documentation for accuracy and completeness, and I agree with the above.

## 2018-11-06 ENCOUNTER — Inpatient Hospital Stay: Payer: BLUE CROSS/BLUE SHIELD

## 2018-11-06 ENCOUNTER — Inpatient Hospital Stay: Payer: BLUE CROSS/BLUE SHIELD | Admitting: General Practice

## 2018-11-06 ENCOUNTER — Inpatient Hospital Stay (HOSPITAL_BASED_OUTPATIENT_CLINIC_OR_DEPARTMENT_OTHER): Payer: BLUE CROSS/BLUE SHIELD | Admitting: Hematology and Oncology

## 2018-11-06 ENCOUNTER — Other Ambulatory Visit: Payer: Self-pay

## 2018-11-06 VITALS — HR 103

## 2018-11-06 DIAGNOSIS — D696 Thrombocytopenia, unspecified: Secondary | ICD-10-CM | POA: Diagnosis not present

## 2018-11-06 DIAGNOSIS — Z95828 Presence of other vascular implants and grafts: Secondary | ICD-10-CM

## 2018-11-06 DIAGNOSIS — Z170 Estrogen receptor positive status [ER+]: Secondary | ICD-10-CM

## 2018-11-06 DIAGNOSIS — Z79899 Other long term (current) drug therapy: Secondary | ICD-10-CM

## 2018-11-06 DIAGNOSIS — C50311 Malignant neoplasm of lower-inner quadrant of right female breast: Secondary | ICD-10-CM | POA: Diagnosis not present

## 2018-11-06 LAB — CBC WITH DIFFERENTIAL (CANCER CENTER ONLY)
Abs Immature Granulocytes: 0.43 10*3/uL — ABNORMAL HIGH (ref 0.00–0.07)
Basophils Absolute: 0 10*3/uL (ref 0.0–0.1)
Basophils Relative: 0 %
Eosinophils Absolute: 0 10*3/uL (ref 0.0–0.5)
Eosinophils Relative: 0 %
HCT: 32 % — ABNORMAL LOW (ref 36.0–46.0)
Hemoglobin: 10.4 g/dL — ABNORMAL LOW (ref 12.0–15.0)
Immature Granulocytes: 6 %
Lymphocytes Relative: 12 %
Lymphs Abs: 0.8 10*3/uL (ref 0.7–4.0)
MCH: 30.5 pg (ref 26.0–34.0)
MCHC: 32.5 g/dL (ref 30.0–36.0)
MCV: 93.8 fL (ref 80.0–100.0)
Monocytes Absolute: 0.7 10*3/uL (ref 0.1–1.0)
Monocytes Relative: 11 %
Neutro Abs: 4.9 10*3/uL (ref 1.7–7.7)
Neutrophils Relative %: 71 %
Platelet Count: 106 10*3/uL — ABNORMAL LOW (ref 150–400)
RBC: 3.41 MIL/uL — ABNORMAL LOW (ref 3.87–5.11)
RDW: 15.9 % — ABNORMAL HIGH (ref 11.5–15.5)
WBC Count: 6.9 10*3/uL (ref 4.0–10.5)
nRBC: 0.9 % — ABNORMAL HIGH (ref 0.0–0.2)

## 2018-11-06 LAB — CMP (CANCER CENTER ONLY)
ALT: 16 U/L (ref 0–44)
AST: 16 U/L (ref 15–41)
Albumin: 3.8 g/dL (ref 3.5–5.0)
Alkaline Phosphatase: 77 U/L (ref 38–126)
Anion gap: 12 (ref 5–15)
BUN: 13 mg/dL (ref 6–20)
CO2: 25 mmol/L (ref 22–32)
Calcium: 9.2 mg/dL (ref 8.9–10.3)
Chloride: 105 mmol/L (ref 98–111)
Creatinine: 0.84 mg/dL (ref 0.44–1.00)
GFR, Est AFR Am: >60 mL/min (ref >60)
GFR, Estimated: >60 mL/min (ref >60)
Glucose, Bld: 111 mg/dL — ABNORMAL HIGH (ref 70–99)
Potassium: 3.2 mmol/L — ABNORMAL LOW (ref 3.5–5.1)
Sodium: 142 mmol/L (ref 135–145)
Total Bilirubin: 0.3 mg/dL (ref 0.3–1.2)
Total Protein: 7.1 g/dL (ref 6.5–8.1)

## 2018-11-06 MED ORDER — SODIUM CHLORIDE 0.9 % IV SOLN
Freq: Once | INTRAVENOUS | Status: AC
  Administered 2018-11-06: 10:00:00 via INTRAVENOUS
  Filled 2018-11-06: qty 250

## 2018-11-06 MED ORDER — HEPARIN SOD (PORK) LOCK FLUSH 100 UNIT/ML IV SOLN
500.0000 [IU] | Freq: Once | INTRAVENOUS | Status: AC | PRN
  Administered 2018-11-06: 500 [IU]
  Filled 2018-11-06: qty 5

## 2018-11-06 MED ORDER — DOXORUBICIN HCL CHEMO IV INJECTION 2 MG/ML
50.0000 mg/m2 | Freq: Once | INTRAVENOUS | Status: AC
  Administered 2018-11-06: 116 mg via INTRAVENOUS
  Filled 2018-11-06: qty 58

## 2018-11-06 MED ORDER — SODIUM CHLORIDE 0.9 % IV SOLN
500.0000 mg/m2 | Freq: Once | INTRAVENOUS | Status: AC
  Administered 2018-11-06: 1160 mg via INTRAVENOUS
  Filled 2018-11-06: qty 58

## 2018-11-06 MED ORDER — PALONOSETRON HCL INJECTION 0.25 MG/5ML
INTRAVENOUS | Status: AC
  Filled 2018-11-06: qty 5

## 2018-11-06 MED ORDER — SODIUM CHLORIDE 0.9% FLUSH
10.0000 mL | INTRAVENOUS | Status: DC | PRN
  Administered 2018-11-06: 10 mL
  Filled 2018-11-06: qty 10

## 2018-11-06 MED ORDER — SODIUM CHLORIDE 0.9 % IV SOLN
Freq: Once | INTRAVENOUS | Status: AC
  Administered 2018-11-06: 10:00:00 via INTRAVENOUS
  Filled 2018-11-06: qty 5

## 2018-11-06 MED ORDER — SODIUM CHLORIDE 0.9% FLUSH
10.0000 mL | Freq: Once | INTRAVENOUS | Status: AC
  Administered 2018-11-06: 10 mL
  Filled 2018-11-06: qty 10

## 2018-11-06 MED ORDER — PALONOSETRON HCL INJECTION 0.25 MG/5ML
0.2500 mg | Freq: Once | INTRAVENOUS | Status: AC
  Administered 2018-11-06: 0.25 mg via INTRAVENOUS

## 2018-11-06 NOTE — Patient Instructions (Signed)
[facility] Discharge Instructions for Patients Receiving Chemotherapy  Today you received the following chemotherapy agents Adriamycin and Cytoxan   To help prevent nausea and vomiting after your treatment, we encourage you to take your nausea medication as directed. No Zofran for 3 days, take Compazine instead.    If you develop nausea and vomiting that is not controlled by your nausea medication, call the clinic.   BELOW ARE SYMPTOMS THAT SHOULD BE REPORTED IMMEDIATELY:  *FEVER GREATER THAN 100.5 F  *CHILLS WITH OR WITHOUT FEVER  NAUSEA AND VOMITING THAT IS NOT CONTROLLED WITH YOUR NAUSEA MEDICATION  *UNUSUAL SHORTNESS OF BREATH  *UNUSUAL BRUISING OR BLEEDING  TENDERNESS IN MOUTH AND THROAT WITH OR WITHOUT PRESENCE OF ULCERS  *URINARY PROBLEMS  *BOWEL PROBLEMS  UNUSUAL RASH Items with * indicate a potential emergency and should be followed up as soon as possible.  Feel free to call the clinic should you have any questions or concerns. The clinic phone number is [phone].  Please show the [facility] at check-in to the Emergency Department and triage nurse.

## 2018-11-06 NOTE — Assessment & Plan Note (Signed)
08/23/2018:Right lumpectomy: IDC, 0.7 cm, grade 3, margins negative, 0/3 lymph nodes negative, ER 40%, PR 0%, HER-2 negative, Ki-67 50%, T1BN0 stage Ia  Oncotype Dx 44: High Risk  Treatment plan: 1.Adj Chemo with Dose dense adriamycin and Cytoxan foll by Taxol weekly X 12 2. Adjuvant radiation therapy followed by 3. Adjuvant antiestrogen therapy ------------------------------------------------------------------------------------------------------------ Current Treatment: Cycle4day 1dose dense Adriamycin and Cytoxan  Chemo Toxicities: 1.Nausea in spite of antiemetics: Much improved with Phenergan 2.Fatigue 3.Leg cramps 4.Headaches 5.Scalp tenderness 6.Mild thrombocytopenia: Platelet count is adequate for treatment today. We will continue to watch and monitor this.   RTC in2weeksfor cycle 1 Taxol

## 2018-11-06 NOTE — Progress Notes (Signed)
Ok to treat with HR per MD Lindi Adie

## 2018-11-07 ENCOUNTER — Inpatient Hospital Stay: Payer: BLUE CROSS/BLUE SHIELD | Admitting: General Practice

## 2018-11-07 ENCOUNTER — Inpatient Hospital Stay: Payer: BLUE CROSS/BLUE SHIELD

## 2018-11-07 ENCOUNTER — Telehealth: Payer: Self-pay | Admitting: General Practice

## 2018-11-07 ENCOUNTER — Other Ambulatory Visit: Payer: Self-pay

## 2018-11-07 DIAGNOSIS — Z170 Estrogen receptor positive status [ER+]: Secondary | ICD-10-CM

## 2018-11-07 DIAGNOSIS — C50311 Malignant neoplasm of lower-inner quadrant of right female breast: Secondary | ICD-10-CM | POA: Diagnosis not present

## 2018-11-07 MED ORDER — PEGFILGRASTIM-CBQV 6 MG/0.6ML ~~LOC~~ SOSY
PREFILLED_SYRINGE | SUBCUTANEOUS | Status: AC
  Filled 2018-11-07: qty 0.6

## 2018-11-07 MED ORDER — PEGFILGRASTIM-CBQV 6 MG/0.6ML ~~LOC~~ SOSY
6.0000 mg | PREFILLED_SYRINGE | Freq: Once | SUBCUTANEOUS | Status: AC
  Administered 2018-11-07: 6 mg via SUBCUTANEOUS

## 2018-11-07 NOTE — Patient Instructions (Signed)
Pegfilgrastim injection  What is this medicine?  PEGFILGRASTIM (PEG fil gra stim) is a long-acting granulocyte colony-stimulating factor that stimulates the growth of neutrophils, a type of white blood cell important in the body's fight against infection. It is used to reduce the incidence of fever and infection in patients with certain types of cancer who are receiving chemotherapy that affects the bone marrow, and to increase survival after being exposed to high doses of radiation.  This medicine may be used for other purposes; ask your health care provider or pharmacist if you have questions.  COMMON BRAND NAME(S): Fulphila, Neulasta, UDENYCA  What should I tell my health care provider before I take this medicine?  They need to know if you have any of these conditions:  -kidney disease  -latex allergy  -ongoing radiation therapy  -sickle cell disease  -skin reactions to acrylic adhesives (On-Body Injector only)  -an unusual or allergic reaction to pegfilgrastim, filgrastim, other medicines, foods, dyes, or preservatives  -pregnant or trying to get pregnant  -breast-feeding  How should I use this medicine?  This medicine is for injection under the skin. If you get this medicine at home, you will be taught how to prepare and give the pre-filled syringe or how to use the On-body Injector. Refer to the patient Instructions for Use for detailed instructions. Use exactly as directed. Tell your healthcare provider immediately if you suspect that the On-body Injector may not have performed as intended or if you suspect the use of the On-body Injector resulted in a missed or partial dose.  It is important that you put your used needles and syringes in a special sharps container. Do not put them in a trash can. If you do not have a sharps container, call your pharmacist or healthcare provider to get one.  Talk to your pediatrician regarding the use of this medicine in children. While this drug may be prescribed for  selected conditions, precautions do apply.  Overdosage: If you think you have taken too much of this medicine contact a poison control center or emergency room at once.  NOTE: This medicine is only for you. Do not share this medicine with others.  What if I miss a dose?  It is important not to miss your dose. Call your doctor or health care professional if you miss your dose. If you miss a dose due to an On-body Injector failure or leakage, a new dose should be administered as soon as possible using a single prefilled syringe for manual use.  What may interact with this medicine?  Interactions have not been studied.  Give your health care provider a list of all the medicines, herbs, non-prescription drugs, or dietary supplements you use. Also tell them if you smoke, drink alcohol, or use illegal drugs. Some items may interact with your medicine.  This list may not describe all possible interactions. Give your health care provider a list of all the medicines, herbs, non-prescription drugs, or dietary supplements you use. Also tell them if you smoke, drink alcohol, or use illegal drugs. Some items may interact with your medicine.  What should I watch for while using this medicine?  You may need blood work done while you are taking this medicine.  If you are going to need a MRI, CT scan, or other procedure, tell your doctor that you are using this medicine (On-Body Injector only).  What side effects may I notice from receiving this medicine?  Side effects that you should report to   your doctor or health care professional as soon as possible:  -allergic reactions like skin rash, itching or hives, swelling of the face, lips, or tongue  -back pain  -dizziness  -fever  -pain, redness, or irritation at site where injected  -pinpoint red spots on the skin  -red or dark-brown urine  -shortness of breath or breathing problems  -stomach or side pain, or pain at the shoulder  -swelling  -tiredness  -trouble passing urine or  change in the amount of urine  Side effects that usually do not require medical attention (report to your doctor or health care professional if they continue or are bothersome):  -bone pain  -muscle pain  This list may not describe all possible side effects. Call your doctor for medical advice about side effects. You may report side effects to FDA at 1-800-FDA-1088.  Where should I keep my medicine?  Keep out of the reach of children.  If you are using this medicine at home, you will be instructed on how to store it. Throw away any unused medicine after the expiration date on the label.  NOTE: This sheet is a summary. It may not cover all possible information. If you have questions about this medicine, talk to your doctor, pharmacist, or health care provider.   2019 Elsevier/Gold Standard (2017-11-12 16:57:08)

## 2018-11-07 NOTE — Telephone Encounter (Signed)
[facility] CSW Progress Notes  Call to patient to check in.  Has reviewed financial aid applications, encouraged to pull together information and request application from [employer] online.  When she has these items, patient can call CSW and schedule time to review and submit applications.  Also checked on progress w anxiety management - states she is using mindfulness techniques learned in last session to decrease anxiety and focus on present moment.  Encouraged to check in as needed and will schedule to meet next week if possible.  Edwyna Shell, LCSW Clinical Social Worker Phone:  [phone]

## 2018-11-13 MED ORDER — CYANOCOBALAMIN 1000 MCG/ML IJ SOLN
INTRAMUSCULAR | Status: AC
  Filled 2018-11-13: qty 1

## 2018-11-13 MED ORDER — PROCHLORPERAZINE MALEATE 10 MG PO TABS
ORAL_TABLET | ORAL | Status: AC
  Filled 2018-11-13: qty 1

## 2018-11-14 ENCOUNTER — Telehealth: Payer: Self-pay | Admitting: *Deleted

## 2018-11-14 ENCOUNTER — Telehealth: Payer: Self-pay | Admitting: General Practice

## 2018-11-14 ENCOUNTER — Inpatient Hospital Stay (HOSPITAL_BASED_OUTPATIENT_CLINIC_OR_DEPARTMENT_OTHER): Payer: BLUE CROSS/BLUE SHIELD | Admitting: Medical

## 2018-11-14 ENCOUNTER — Other Ambulatory Visit: Payer: Self-pay

## 2018-11-14 VITALS — BP 138/78 | HR 107 | Temp 99.7°F | Resp 18

## 2018-11-14 DIAGNOSIS — C50311 Malignant neoplasm of lower-inner quadrant of right female breast: Secondary | ICD-10-CM

## 2018-11-14 DIAGNOSIS — Z79899 Other long term (current) drug therapy: Secondary | ICD-10-CM

## 2018-11-14 DIAGNOSIS — Z170 Estrogen receptor positive status [ER+]: Secondary | ICD-10-CM | POA: Diagnosis not present

## 2018-11-14 DIAGNOSIS — J029 Acute pharyngitis, unspecified: Secondary | ICD-10-CM

## 2018-11-14 DIAGNOSIS — K1379 Other lesions of oral mucosa: Secondary | ICD-10-CM

## 2018-11-14 DIAGNOSIS — I10 Essential (primary) hypertension: Secondary | ICD-10-CM

## 2018-11-14 MED ORDER — AMOXICILLIN-POT CLAVULANATE 875-125 MG PO TABS: 1.0000 | ORAL_TABLET | Freq: Two times a day (BID) | ORAL | 0 refills | Status: DC

## 2018-11-14 MED ORDER — HYDROCODONE-ACETAMINOPHEN 5-325 MG PO TABS: ORAL_TABLET | ORAL | 0 refills | Status: DC

## 2018-11-14 MED ORDER — LIDOCAINE VISCOUS HCL 2 % MT SOLN: 5.0000 mL | OROMUCOSAL | 1 refills | Status: DC | PRN

## 2018-11-14 MED ORDER — MAGIC MOUTHWASH: 5.0000 mL | Freq: Four times a day (QID) | ORAL | 2 refills | Status: DC | PRN

## 2018-11-14 NOTE — Telephone Encounter (Signed)
[facility] CSW Progress Notes  PAtient will bring additional paperwork for financial assistance applications to [facility] on 3/27 and give to [place] will review next week and submit as appropriate or call patient w any additional documents needed.  Edwyna Shell, LCSW Clinical Social Worker Phone:  [phone]

## 2018-11-14 NOTE — Patient Instructions (Signed)
Oral Mucositis  Oral mucositis is a mouth condition that may develop as a result of treatments for cancer. Sores may appear on your lips, gums, tongue, throat, and the top (roof) or bottom (floor) of your mouth.  What are the causes?  This condition can happen to anyone who is being treated with cancer therapies, including:  · Cancer medicines (chemotherapy).  · Radiation therapy.  · Bone marrow transplants and stem cell transplants.  Cancer treatments can damage the lining of the mouth, which causes this condition. Oral mucositis is not caused by infection. However, the sores can become infected after they form. Infection can make oral mucositis worse.  What increases the risk?  The following factors may make you more likely to develop this condition:  · Having poor oral hygiene.  · Having dental problems or oral diseases.  · Using products that contain nicotine or tobacco, such as cigarettes, chewing tobacco, and e-cigarettes.  · Drinking alcohol.  · Having other medical conditions, such as diabetes, HIV, AIDS, or kidney disease.  · Not drinking enough clear fluids.  · Wearing dentures that do not fit correctly.  · Having cancers that primarily affect the blood.  · Having cancers of the head and neck.  · Receiving radiation therapy to the head and neck region.  What are the signs or symptoms?  Symptoms of this condition can vary from mild to severe. Symptoms are usually seen 7-10 days after cancer treatment has started. They include:  · Mouth sores. These sores may bleed.  · Color changes inside the mouth. Red, shiny areas may appear.  · White patches or pus in the mouth.  · Pain in the mouth and throat. This can make it painful to speak and swallow.  · Dryness and a burning feeling in the mouth.  · Saliva that is dry and thick.  · Trouble eating, drinking, and swallowing. This can lead to weight loss.  How is this diagnosed?  This condition can be diagnosed with a physical exam.  In some cases, lab tests or  cultures may be done to check for an associated infection.  How is this treated?  Treatment depends on the severity of the condition. Oral mucositis often heals on its own. Sometimes, changes in the cancer treatment can help. Treatment may include medicines, such as:  · An antibiotic medicine to fight infection, if present.  · Medicine to help the cells in your mouth heal more quickly.  Medicine may also be given to help control pain. This may include:  · Pain relievers that are swished around in the mouth. These make the mouth numb to ease the pain (topical anesthetics).  · Mouth rinses.  · Prescribed, medicated gels. The gel coats the mouth. This protects nerve endings and lessens the pain.  · Pain medicines.  Follow these instructions at home:  Medicines  · Take or apply over-the-counter and prescription medicines only as told by your health care provider.  · If you were prescribed an antibiotic medicine, take or apply it as told by your health care provider. Do not stop using the antibiotic even if you start to feel better.  · Do not use products that contain benzocaine (including numbing gels) to treat mouth pain in children who are younger than 2 years. These products may cause a rare but serious blood condition.  Eating and drinking    · Talk to a diet and nutrition specialist (dietitian) about what you should eat and drink if you   have mucositis.  · Drink high-nutrition and high-calorie shakes or supplements.  · Eat bland and soft foods that are easy to eat.  · Drink enough fluid to keep your urine pale yellow.  · Do not eat foods that are hot, spicy, citrus, or hard to swallow.  · Do not drink alcohol.  Lifestyle         · Keep your mouth clean and germ-free. To maintain good oral hygiene:  ? Brush your teeth carefully with a soft toothbrush at least two times each day. Use a gentle toothpaste. Ask your health care provider to recommend the right toothpaste for you.  ? Use a soft sponge (oral swab) to clean  your mouth and teeth instead of a toothbrush if mouth sores are severe.  ? Floss your teeth every day.  ? Have your teeth cleaned regularly as recommended by your dentist.  ? Rinse your mouth after every meal or as directed by your health care provider. Do not use mouthwash that contains alcohol. Ask your health care provider for a mouthwash or mouth rinse recommendation.  · Do not use any products that contain nicotine or tobacco, such as cigarettes and e-cigarettes. If you need help quitting, ask your health care provider.  General instructions  · Follow instructions from your health care provider about:  ? Cleaning mouth sores.  ? Taking out your dentures.  ? Changing your diet or finding other ways to get nutrients. This is important if you are losing weight.  · If your lips are dry or cracked, apply a water-based moisturizer to your lips as needed.  · Try sucking on ice chips or sugar-free frozen pops. This may help with pain. This also keeps your mouth moist.  · Keep all follow-up visits as told by your health care provider. This is important.  Contact a health care provider if:  · You have mouth pain or throat pain.  · You are having more trouble swallowing.  · Your symptoms get worse.  · You have new symptoms.  · Your pain is not controlled with medicine.  · You have trouble speaking.  Get help right away if you:  · Have a fever.  · Cannot swallow solid food or liquids.  · Have a lot of bleeding in your mouth.  · Develop new, open, or draining sores in your mouth.  Summary  · Oral mucositis is a mouth condition that may develop as a result of treatments for cancer. Sores may appear on your lips, gums, tongue, throat, and the top (roof) or bottom (floor) of your mouth.  · Cancer treatments can damage the lining of the mouth, which causes this condition.  · Treatment depends on how severe the condition is. It may include medicine to fight infection, medicine to ease pain, or medicine to help the cells in your  mouth heal more quickly.  This information is not intended to replace advice given to you by your health care provider. Make sure you discuss any questions you have with your health care provider.  Document Released: 03/24/2011 Document Revised: 08/23/2017 Document Reviewed: 08/23/2017  Elsevier Interactive Patient Education © 2019 [employer]

## 2018-11-14 NOTE — Telephone Encounter (Signed)
Received a call from pt stating over the last week she has had mouth sores from her Adriamycin chemotherapy treatment.  Pt states she has been using biotin mouth wash with no symptom relief.  Pt states that she now has an abscess on the left side of her mouth causing swelling and now ear ache.  Pt wanting to come in to be evaluated.  I told pt we could schedule her an apt with Sandi Mealy, PA in symptom management to see the pt today.  Pt states she also has no symptoms related to covid 19.  Message sent to scheduling and pt very appreciative.

## 2018-11-14 NOTE — Progress Notes (Signed)
Pt presents with hx mucositis, however she reports "I've never had one like this".  Swelling on L side face/mouth with redness along mucous membranes, denies recent injury or pus/drainiage, reports bleeding gums.  Denies CP, cough, SOB or difficulty swallowing.  Radiation of throbbing pain to L ear.  Temp 99.7.  PA Lucianne Lei aware.

## 2018-11-15 NOTE — Progress Notes (Signed)
[facility] Progress Note   [name] [name] [id number] 11-Apr-1966 53 y.o.  Nitika Jackowski is managed by Dr. Nicholas Lose  Actively treated with chemotherapy/immunotherapy/hormonal therapy: yes  Current therapy: Adriamycin and Cytoxan with Udenyca support  Last treated: 11/06/2018 (cycle 4)  Next scheduled appointment with provider: 11/20/2018  Assessment: Plan:    Sore throat - Plan: amoxicillin-clavulanate (AUGMENTIN) 875-125 MG tablet, magic mouthwash SOLN, lidocaine (XYLOCAINE) 2 % solution, HYDROcodone-acetaminophen (NORCO) 5-325 MG tablet  Oral tenderness - Plan: magic mouthwash SOLN, lidocaine (XYLOCAINE) 2 % solution, HYDROcodone-acetaminophen (NORCO) 5-325 MG tablet  Malignant neoplasm of lower-inner quadrant of right breast of female, estrogen receptor positive (HCC)   Sore throat and oral tenderness: The patient was given a prescription for Augmentin 875-125 p.o. twice daily x7 days, viscous lidocaine, Magic mouthwash, and Norco.  ER positive malignant neoplasm of the right breast: The patient continues to be followed by Dr. Nicholas Lose and is status post cycle 4 of Adriamycin and Cytoxan which was dosed on 11/06/2018 with the Udenyca dosed on 11/07/2018.  She will be seen in follow-up on 11/20/2018.  Please see After Visit Summary for patient specific instructions.  Future Appointments  Date Time Provider [place]  11/20/2018  7:45 AM [facility] LAB 3 [facility] None  11/20/2018  8:00 AM [facility] [place] None  11/20/2018  8:15 AM Nicholas Lose, MD [facility] None  11/20/2018  9:15 AM [facility] INFUSION [facility] None  11/27/2018  8:45 AM [facility] LAB 3 [facility] None  11/27/2018  9:00 AM [facility] [place] FLUSH [facility] None  11/27/2018  9:30 AM Nicholas Lose, MD [facility] None  11/27/2018 10:30 AM [facility] INFUSION [facility] None  12/04/2018  8:00 AM [facility] LAB 3 [facility] None  12/04/2018  8:15 AM  [facility] FLUSH [facility] None  12/04/2018  9:15 AM [facility] INFUSION [facility] None  12/11/2018  8:15 AM [facility] LAB 3 [facility] None  12/11/2018  8:30 AM [facility] [place] FLUSH [facility] None  12/11/2018  9:00 AM Nicholas Lose, MD [facility] None  12/11/2018 10:00 AM [facility] INFUSION [facility] None  12/18/2018  8:15 AM [facility] LAB 4 [facility] None  12/18/2018  8:30 AM [facility] [place] FLUSH [facility] None  12/18/2018  9:30 AM [facility] INFUSION [facility] None  12/25/2018  8:00 AM [facility] LAB 4 [facility] None  12/25/2018  8:15 AM [facility] [place] FLUSH [facility] None  12/25/2018  8:45 AM Nicholas Lose, MD [facility] None  12/25/2018  9:30 AM [facility] INFUSION [facility] None  01/01/2019  8:15 AM [facility] LAB 3 [facility] None  01/01/2019  8:30 AM [facility] [place] FLUSH [facility] None  01/01/2019  9:30 AM [facility] INFUSION [facility] None  01/08/2019  7:45 AM [facility] LAB 3 [facility] None  01/08/2019  8:00 AM [facility] [place] FLUSH [facility] None  01/08/2019  8:30 AM Nicholas Lose, MD [facility] None  01/08/2019  9:30 AM [facility] INFUSION [facility] None    No orders of the defined types were placed in this encounter.      Subjective:   Patient ID:  [name] [name] is a 53 y.o. (DOB 22-Jul-1966) female.  Chief Complaint:  Chief Complaint  Patient presents with  . Oral Pain    HPI [name] [name] is a 53 year old female with a history of an ER positive malignant neoplasm of the right breast.  She is followed by Dr. Nicholas Lose and is status post cycle 4 of Adriamycin and Cytoxan which was dosed on 11/06/2018 with the Udenyca dosed on 11/07/2018.  She presents the office today with  a report that she has had oral tenderness, pain in her left ear, swelling of her mouth and left neck, oral tenderness, and bleeding gums.  She denies nausea, vomiting, diarrhea, chest pain, shortness of breath, cough, or fevers.  Medications: I have reviewed the  patient's current medications.  Allergies:  Allergies  Allergen Reactions  . Ibuprofen Swelling    SWELLING REACTION UNSPECIFIED   . Lisinopril Swelling    angioedema  . Dilaudid [Hydromorphone Hcl] Nausea Only    Past Medical History:  Diagnosis Date  . Allergy    seasonal  . Anal fissure   . Complication of anesthesia   . Diverticulitis   . GERD (gastroesophageal reflux disease) 07/07/2005  . Hemorrhoid   . Hepatic steatosis   . Hypertension   . Internal hemorrhoids   . Malignant neoplasm of lower-inner quadrant of right breast of female, estrogen receptor positive ([place]) 07/16/2018  . PONV (postoperative nausea and vomiting)   . Positive H. pylori test     Past Surgical History:  Procedure Laterality Date  . BREAST LUMPECTOMY WITH RADIOACTIVE SEED AND SENTINEL LYMPH NODE BIOPSY Right 08/23/2018   Procedure: RIGHT BREAST LUMPECTOMY WITH RADIOACTIVE SEED AND RIGHT SENTINEL LYMPH NODE MAPPING;  Surgeon: Erroll Luna, MD;  Location: [facility];  Service: General;  Laterality: Right;  . COLON RESECTION  2000's Dr. Hassell Done   Diverticulitis.  . COLONOSCOPY    . FOOT SURGERY Left   . g3p2 c-section1    . KNEE SURGERY Right   . PORTACATH PLACEMENT Right 09/24/2018   Procedure: INSERTION PORT-A-CATH WITH ULTRASOUND;  Surgeon: Erroll Luna, MD;  Location: [facility];  Service: General;  Laterality: Right;  . TONSILLECTOMY    . TUBAL LIGATION      Family History  Problem Relation Age of Onset  . Hypertension Mother   . Hypertension Father   . Heart disease Father   . Diabetes Neg Hx   . COPD Neg Hx     Social History   Socioeconomic History  . Marital status: Divorced    Spouse name: Not on file  . Number of children: 3  . Years of education: 66  . Highest education level: Not on file  Occupational History    Employer: NOT EMPLOYED  Social Needs  . Financial resource strain: Not on file  . Food insecurity:    Worry: Not on file    Inability: Not on  file  . Transportation needs:    Medical: Not on file    Non-medical: Not on file  Tobacco Use  . Smoking status: Never Smoker  . Smokeless tobacco: Never Used  Substance and Sexual Activity  . Alcohol use: No    Alcohol/week: 0.0 standard drinks  . Drug use: No  . Sexual activity: Never    Birth control/protection: Post-menopausal    Comment: BTL  Lifestyle  . Physical activity:    Days per week: Not on file    Minutes per session: Not on file  . Stress: Not on file  Relationships  . Social connections:    Talks on phone: Not on file    Gets together: Not on file    Attends religious service: Not on file    Active member of club or organization: Not on file    Attends meetings of clubs or organizations: Not on file    Relationship status: Not on file  . Intimate partner violence:    Fear of current or ex partner: Not on  file    Emotionally abused: Not on file    Physically abused: Not on file    Forced sexual activity: Not on file  Other Topics Concern  . Not on file  Social History Narrative   [employer], [employer] - Human service/sociology. Married '2000 - 71yrs/seperated. 2 boys - '82, '87, 1 dtr - '81.   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     Past Medical History, Surgical history, Social history, and Family history were reviewed and updated as appropriate.   Please see review of systems for further details on the patient's review from today.   Review of Systems:  Review of Systems  Objective:   Physical Exam:  BP 138/78 (BP Location: Left Arm, Patient Position: Sitting)   Pulse (!) 107   Temp 99.7 F (37.6 C) (Oral)   Resp 18   LMP 09/13/2015 Comment: celebate, BTL  SpO2 100%  ECOG: 1  Physical Exam  Lab Review:     Component Value Date/Time   NA 142 11/06/2018 0757   K 3.2 (L) 11/06/2018 0757   CL 105 11/06/2018 0757   CO2 25 11/06/2018 0757   GLUCOSE 111 (H) 11/06/2018 0757   BUN 13 11/06/2018 0757   CREATININE 0.84 11/06/2018 0757   CALCIUM 9.2  11/06/2018 0757   PROT 7.1 11/06/2018 0757   ALBUMIN 3.8 11/06/2018 0757   AST 16 11/06/2018 0757   ALT 16 11/06/2018 0757   ALKPHOS 77 11/06/2018 0757   BILITOT 0.3 11/06/2018 0757   GFRNONAA >60 11/06/2018 0757   GFRAA >60 11/06/2018 0757       Component Value Date/Time   WBC 6.9 11/06/2018 0757   WBC 4.9 08/16/2018 0945   RBC 3.41 (L) 11/06/2018 0757   HGB 10.4 (L) 11/06/2018 0757   HCT 32.0 (L) 11/06/2018 0757   PLT 106 (L) 11/06/2018 0757   MCV 93.8 11/06/2018 0757   MCH 30.5 11/06/2018 0757   MCHC 32.5 11/06/2018 0757   RDW 15.9 (H) 11/06/2018 0757   LYMPHSABS 0.8 11/06/2018 0757   MONOABS 0.7 11/06/2018 0757   EOSABS 0.0 11/06/2018 0757   BASOSABS 0.0 11/06/2018 0757   -------------------------------  Imaging from last 24 hours (if applicable):  Radiology interpretation: No results found.

## 2018-11-19 NOTE — Progress Notes (Signed)
Patient Care Team: [name], [name], MD as PCP - General (Family Medicine) Erroll Luna, MD as Consulting Physician (General Surgery) Nicholas Lose, MD as Consulting Physician (Hematology and Oncology) Kyung Rudd, MD as Consulting Physician (Radiation Oncology)  DIAGNOSIS:    ICD-10-CM   1. Malignant neoplasm of lower-inner quadrant of right breast of female, estrogen receptor positive ([place]) C50.311    Z17.0     SUMMARY OF ONCOLOGIC HISTORY:   Malignant neoplasm of lower-inner quadrant of right breast of female, estrogen receptor positive ([place])   07/16/2018 Initial Diagnosis    Pain right breast UOQ: Negative on mammogram, incidentally found right breast mass LIQ at 3:30 position measuring 0.6 cm biopsy revealed grade 3 IDC ER 40%, PR 0%, HER-2 negative, Ki-67 50%, T1bN0 stage Ib    07/24/2018 Cancer Staging    Staging form: Breast, AJCC 8th Edition - Clinical: Stage IB (cT1b, cN0, cM0, G3, ER+, PR-, HER2-) - Signed by Nicholas Lose, MD on 07/24/2018    08/23/2018 Surgery    Right lumpectomy: IDC, 0.7 cm, grade 3, margins negative, 0/3 lymph nodes negative, ER 40%, PR 0%, HER-2 negative, Ki-67 50%, T1BN0 stage Ia    09/04/2018 Cancer Staging    Staging form: Breast, AJCC 8th Edition - Pathologic: Stage IA (pT1b, pN0, cM0, G3, ER+, PR-, HER2-) - Signed by Gardenia Phlegm, NP on 09/04/2018    09/25/2018 -  Chemotherapy    Adjuvant chemotherapy with dose dense Adriamycin and Cytoxan x4 followed by Taxol weekly x12.       CHIEF COMPLIANT: Cycle 1 Taxol  INTERVAL HISTORY: [name] [name] is a 53 y.o. with above-mentioned history of right breast cancer treated with lumpectomywho completed 4 cycles of neoadjuvant dose dense Adriamycin and Cytoxan and is currently receiving weekly Taxol treatments. She presents to the clinicalonetodayand tolerated her last treatment much better than cycle 3.  She however feels nauseated intermittently.  Fatigue as well from chemo.   Denies any fevers or chills.  REVIEW OF SYSTEMS:   Constitutional: Denies fevers, chills or abnormal weight loss Eyes: Denies blurriness of vision Ears, nose, mouth, throat, and face: Denies mucositis or sore throat Respiratory: Denies cough, dyspnea or wheezes Cardiovascular: Denies palpitation, chest discomfort Gastrointestinal: Denies nausea, heartburn or change in bowel habits Skin: Denies abnormal skin rashes Lymphatics: Denies new lymphadenopathy or easy bruising Neurological: Denies numbness, tingling or new weaknesses Behavioral/Psych: Mood is stable, no new changes  Extremities: No lower extremity edema Breast: denies any pain or lumps or nodules in either breasts All other systems were reviewed with the patient and are negative.  I have reviewed the past medical history, past surgical history, social history and family history with the patient and they are unchanged from previous note.  ALLERGIES:  is allergic to ibuprofen; lisinopril; and dilaudid [hydromorphone hcl].  MEDICATIONS:  Current Outpatient Medications  Medication Sig Dispense Refill  . Cholecalciferol (VITAMIN D3) 5000 UNITS TABS Take 1 tablet by mouth daily.    . clobetasol ointment (TEMOVATE) 4.09 % Apply 1 application topically 2 (two) times daily. Then as needed for 30 days  1  . Flaxseed, Linseed, (FLAX SEED OIL PO) Take 5 mLs by mouth every morning.    . gabapentin (NEURONTIN) 300 MG capsule Take 1 capsule (300 mg total) by mouth 2 (two) times daily. 20 capsule 1  . lidocaine (XYLOCAINE) 2 % solution Use as directed 5 mLs in the mouth or throat every 3 (three) hours as needed for mouth pain (swish and swallow  of spit). 200 mL 1  . lidocaine-prilocaine (EMLA) cream Apply to affected area once 30 g 3  . LORazepam (ATIVAN) 0.5 MG tablet Take 1 tablet (0.5 mg total) by mouth at bedtime as needed for sleep. 30 tablet 0  . naproxen sodium (ALEVE) 220 MG tablet Take 660 mg by mouth daily as needed (pain).    .  Olmesartan-amLODIPine-HCTZ 40-10-25 MG TABS Take 1 tablet by mouth daily.    Marland Kitchen omeprazole (PRILOSEC) 40 MG capsule Take 1 capsule (40 mg total) by mouth daily. 30 capsule 0  . ondansetron (ZOFRAN) 8 MG tablet Take 1 tablet (8 mg total) by mouth 2 (two) times daily as needed. Start on the third day after chemotherapy. 30 tablet 1  . prochlorperazine (COMPAZINE) 10 MG tablet Take 1 tablet (10 mg total) by mouth every 6 (six) hours as needed (Nausea or vomiting). 30 tablet 1  . promethazine (PHENERGAN) 25 MG tablet Take 1 tablet (25 mg total) by mouth every 6 (six) hours as needed for nausea. 30 tablet 3  . tetrahydrozoline-zinc (VISINE-AC) 0.05-0.25 % ophthalmic solution Place 2 drops into both eyes as needed (dry, itchy eyes).     No current facility-administered medications for this visit.     PHYSICAL EXAMINATION: ECOG PERFORMANCE STATUS: 1 - Symptomatic but completely ambulatory  Vitals:   11/20/18 0831  BP: 125/74  Pulse: (!) 108  Resp: 17  Temp: 98.7 F (37.1 C)  SpO2: 100%   Filed Weights   11/20/18 0831  Weight: 254 lb 3.2 oz (115.3 kg)    GENERAL: alert, no distress and comfortable SKIN: skin color, texture, turgor are normal, no rashes or significant lesions EYES: normal, Conjunctiva are pink and non-injected, sclera clear OROPHARYNX: no exudate, no erythema and lips, buccal mucosa, and tongue normal  NECK: supple, thyroid normal size, non-tender, without nodularity LYMPH: no palpable lymphadenopathy in the cervical, axillary or inguinal LUNGS: clear to auscultation and percussion with normal breathing effort HEART: regular rate & rhythm and no murmurs and no lower extremity edema ABDOMEN: abdomen soft, non-tender and normal bowel sounds MUSCULOSKELETAL: no cyanosis of digits and no clubbing  NEURO: alert & oriented x 3 with fluent speech, no focal motor/sensory deficits EXTREMITIES: No lower extremity edema  LABORATORY DATA:  I have reviewed the data as listed CMP  Latest Ref Rng & Units 11/20/2018 11/06/2018 10/23/2018  Glucose 70 - 99 mg/dL 106(H) 111(H) 107(H)  BUN 6 - 20 mg/dL _0 Creatinine 0.44 - 1.00 mg/dL 0.82 0.84 0.90  Sodium 135 - 145 mmol/L 140 142 140  Potassium 3.5 - 5.1 mmol/L 3.1(L) 3.2(L) 3.6  Chloride 98 - 111 mmol/L 102 105 105  CO2 22 - 32 mmol/L _1 Calcium 8.9 - 10.3 mg/dL 9.6 9.2 9.1  Total Protein 6.5 - 8.1 g/dL 7.0 7.1 7.1  Total Bilirubin 0.3 - 1.2 mg/dL 0.4 0.3 0.4  Alkaline Phos 38 - 126 U/L 86 77 103  AST 15 - 41 U/L _2 ALT 0 - 44 U/L _3 Lab Results  Component Value Date   WBC 7.4 11/20/2018   HGB 10.3 (L) 11/20/2018   HCT 32.0 (L) 11/20/2018   MCV 94.7 11/20/2018   PLT 109 (L) 11/20/2018   NEUTROABS 5.3 11/20/2018    ASSESSMENT & PLAN:  Malignant neoplasm of lower-inner quadrant of right breast of female, estrogen receptor positive (HCC) 08/23/2018:Right lumpectomy: IDC, 0.7 cm, grade 3, margins negative, 0/3 lymph nodes  negative, ER 40%, PR 0%, HER-2 negative, Ki-67 50%, T1BN0 stage Ia  Oncotype Dx 44: High Risk  Treatment plan: 1.Adj Chemo with Dose dense adriamycin and Cytoxan foll by Taxol weekly X 12 2. Adjuvant radiation therapy followed by 3. Adjuvant antiestrogen therapy ------------------------------------------------------------------------------------------------------------ Current Treatment: Completed 4 cyclesdose dense Adriamycin and Cytoxan, today cycle 1 of Taxol  Chemo Toxicities: 1.Nausea in spite of antiemetics: Reduce the dosage of chemo for cycle 4 2.Fatigue: Profound fatigue 3.Leg cramps 4.Headaches 5.Scalp tenderness 6.Mild thrombocytopenia: Platelet count is adequate for treatment today. We will continue to watch and monitor this.   RTC in1 week for cycle 2 Taxol     No orders of the defined types were placed in this encounter.  The patient has a good understanding of the overall plan. she agrees with it. she will call with any  problems that may develop before the next visit here.  Nicholas Lose, MD 11/20/2018  Julious Oka Dorshimer am acting as scribe for Dr. Nicholas Lose.  I have reviewed the above documentation for accuracy and completeness, and I agree with the above.

## 2018-11-20 ENCOUNTER — Inpatient Hospital Stay (HOSPITAL_BASED_OUTPATIENT_CLINIC_OR_DEPARTMENT_OTHER): Payer: BLUE CROSS/BLUE SHIELD | Admitting: Hematology and Oncology

## 2018-11-20 ENCOUNTER — Telehealth: Payer: Self-pay | Admitting: Hematology and Oncology

## 2018-11-20 ENCOUNTER — Other Ambulatory Visit: Payer: Self-pay

## 2018-11-20 ENCOUNTER — Inpatient Hospital Stay: Payer: BLUE CROSS/BLUE SHIELD | Attending: Hematology and Oncology

## 2018-11-20 ENCOUNTER — Inpatient Hospital Stay: Payer: BLUE CROSS/BLUE SHIELD

## 2018-11-20 VITALS — BP 124/75 | HR 103 | Temp 98.9°F | Resp 18

## 2018-11-20 DIAGNOSIS — Z170 Estrogen receptor positive status [ER+]: Secondary | ICD-10-CM

## 2018-11-20 DIAGNOSIS — D696 Thrombocytopenia, unspecified: Secondary | ICD-10-CM

## 2018-11-20 DIAGNOSIS — D6481 Anemia due to antineoplastic chemotherapy: Secondary | ICD-10-CM | POA: Diagnosis not present

## 2018-11-20 DIAGNOSIS — C50311 Malignant neoplasm of lower-inner quadrant of right female breast: Secondary | ICD-10-CM

## 2018-11-20 DIAGNOSIS — Z79899 Other long term (current) drug therapy: Secondary | ICD-10-CM | POA: Insufficient documentation

## 2018-11-20 DIAGNOSIS — Z95828 Presence of other vascular implants and grafts: Secondary | ICD-10-CM

## 2018-11-20 DIAGNOSIS — Z5111 Encounter for antineoplastic chemotherapy: Secondary | ICD-10-CM | POA: Diagnosis not present

## 2018-11-20 DIAGNOSIS — T451X5A Adverse effect of antineoplastic and immunosuppressive drugs, initial encounter: Secondary | ICD-10-CM | POA: Insufficient documentation

## 2018-11-20 LAB — CBC WITH DIFFERENTIAL (CANCER CENTER ONLY)
Abs Immature Granulocytes: 0.48 10*3/uL — ABNORMAL HIGH (ref 0.00–0.07)
Basophils Absolute: 0 10*3/uL (ref 0.0–0.1)
Basophils Relative: 0 %
Eosinophils Absolute: 0 10*3/uL (ref 0.0–0.5)
Eosinophils Relative: 0 %
HCT: 32 % — ABNORMAL LOW (ref 36.0–46.0)
Hemoglobin: 10.3 g/dL — ABNORMAL LOW (ref 12.0–15.0)
Immature Granulocytes: 7 %
Lymphocytes Relative: 10 %
Lymphs Abs: 0.7 10*3/uL (ref 0.7–4.0)
MCH: 30.5 pg (ref 26.0–34.0)
MCHC: 32.2 g/dL (ref 30.0–36.0)
MCV: 94.7 fL (ref 80.0–100.0)
Monocytes Absolute: 0.8 10*3/uL (ref 0.1–1.0)
Monocytes Relative: 11 %
Neutro Abs: 5.3 10*3/uL (ref 1.7–7.7)
Neutrophils Relative %: 72 %
Platelet Count: 109 10*3/uL — ABNORMAL LOW (ref 150–400)
RBC: 3.38 MIL/uL — ABNORMAL LOW (ref 3.87–5.11)
RDW: 16.7 % — ABNORMAL HIGH (ref 11.5–15.5)
WBC Count: 7.4 10*3/uL (ref 4.0–10.5)
nRBC: 1.2 % — ABNORMAL HIGH (ref 0.0–0.2)

## 2018-11-20 LAB — CMP (CANCER CENTER ONLY)
ALT: 19 U/L (ref 0–44)
AST: 17 U/L (ref 15–41)
Albumin: 3.7 g/dL (ref 3.5–5.0)
Alkaline Phosphatase: 86 U/L (ref 38–126)
Anion gap: 12 (ref 5–15)
BUN: 8 mg/dL (ref 6–20)
CO2: 26 mmol/L (ref 22–32)
Calcium: 9.6 mg/dL (ref 8.9–10.3)
Chloride: 102 mmol/L (ref 98–111)
Creatinine: 0.82 mg/dL (ref 0.44–1.00)
GFR, Est AFR Am: >60 mL/min (ref >60)
GFR, Estimated: >60 mL/min (ref >60)
Glucose, Bld: 106 mg/dL — ABNORMAL HIGH (ref 70–99)
Potassium: 3.1 mmol/L — ABNORMAL LOW (ref 3.5–5.1)
Sodium: 140 mmol/L (ref 135–145)
Total Bilirubin: 0.4 mg/dL (ref 0.3–1.2)
Total Protein: 7 g/dL (ref 6.5–8.1)

## 2018-11-20 MED ORDER — SODIUM CHLORIDE 0.9% FLUSH
10.0000 mL | INTRAVENOUS | Status: DC | PRN
  Administered 2018-11-20: 13:00:00 10 mL
  Filled 2018-11-20: qty 10

## 2018-11-20 MED ORDER — DIPHENHYDRAMINE HCL 50 MG/ML IJ SOLN
50.0000 mg | Freq: Once | INTRAMUSCULAR | Status: AC
  Administered 2018-11-20: 50 mg via INTRAVENOUS

## 2018-11-20 MED ORDER — SODIUM CHLORIDE 0.9 % IV SOLN
80.0000 mg/m2 | Freq: Once | INTRAVENOUS | Status: AC
  Administered 2018-11-20: 186 mg via INTRAVENOUS
  Filled 2018-11-20: qty 31

## 2018-11-20 MED ORDER — HEPARIN SOD (PORK) LOCK FLUSH 100 UNIT/ML IV SOLN
500.0000 [IU] | Freq: Once | INTRAVENOUS | Status: AC | PRN
  Administered 2018-11-20: 500 [IU]
  Filled 2018-11-20: qty 5

## 2018-11-20 MED ORDER — SODIUM CHLORIDE 0.9 % IV SOLN
20.0000 mg | Freq: Once | INTRAVENOUS | Status: AC
  Administered 2018-11-20: 10:00:00 20 mg via INTRAVENOUS
  Filled 2018-11-20: qty 2

## 2018-11-20 MED ORDER — SODIUM CHLORIDE 0.9% FLUSH
10.0000 mL | Freq: Once | INTRAVENOUS | Status: AC
  Administered 2018-11-20: 10 mL
  Filled 2018-11-20: qty 10

## 2018-11-20 MED ORDER — DIPHENHYDRAMINE HCL 50 MG/ML IJ SOLN
INTRAMUSCULAR | Status: AC
  Filled 2018-11-20: qty 1

## 2018-11-20 MED ORDER — SODIUM CHLORIDE 0.9 % IV SOLN
Freq: Once | INTRAVENOUS | Status: AC
  Administered 2018-11-20: 10:00:00 via INTRAVENOUS
  Filled 2018-11-20: qty 250

## 2018-11-20 MED ORDER — FAMOTIDINE IN NACL 20-0.9 MG/50ML-% IV SOLN: 20.0000 mg | Freq: Once | INTRAVENOUS | Status: DC

## 2018-11-20 MED ORDER — SODIUM CHLORIDE 0.9 % IV SOLN
20.0000 mg | Freq: Once | INTRAVENOUS | Status: AC
  Administered 2018-11-20: 20 mg via INTRAVENOUS
  Filled 2018-11-20: qty 20

## 2018-11-20 MED ORDER — SODIUM CHLORIDE 0.9 % IV SOLN
20.0000 mg | Freq: Once | INTRAVENOUS | Status: DC
  Filled 2018-11-20: qty 2

## 2018-11-20 NOTE — Telephone Encounter (Signed)
No 4/1 los °

## 2018-11-20 NOTE — Patient Instructions (Signed)
[facility] Discharge Instructions for Patients Receiving Chemotherapy  Today you received the following chemotherapy agents: paclitaxel (Taxol)   To help prevent nausea and vomiting after your treatment, we encourage you to take your nausea medication as directed. No Zofran for 3 days, take Compazine instead.    If you develop nausea and vomiting that is not controlled by your nausea medication, call the clinic.   BELOW ARE SYMPTOMS THAT SHOULD BE REPORTED IMMEDIATELY:  *FEVER GREATER THAN 100.5 F  *CHILLS WITH OR WITHOUT FEVER  NAUSEA AND VOMITING THAT IS NOT CONTROLLED WITH YOUR NAUSEA MEDICATION  *UNUSUAL SHORTNESS OF BREATH  *UNUSUAL BRUISING OR BLEEDING  TENDERNESS IN MOUTH AND THROAT WITH OR WITHOUT PRESENCE OF ULCERS  *URINARY PROBLEMS  *BOWEL PROBLEMS  UNUSUAL RASH Items with * indicate a potential emergency and should be followed up as soon as possible.  Feel free to call the clinic should you have any questions or concerns. The clinic phone number is [phone].  Please show the [facility] at check-in to the Emergency Department and triage nurse.  Paclitaxel injection What is this medicine? PACLITAXEL (PAK li TAX el) is a chemotherapy drug. It targets fast dividing cells, like cancer cells, and causes these cells to die. This medicine is used to treat ovarian cancer, breast cancer, lung cancer, Kaposi's sarcoma, and other cancers. This medicine may be used for other purposes; ask your health care provider or pharmacist if you have questions. COMMON BRAND NAME(S): Onxol, Taxol What should I tell my health care provider before I take this medicine? They need to know if you have any of these conditions: -history of irregular heartbeat -liver disease -low blood counts, like low white cell, platelet, or red cell counts -lung or breathing disease, like asthma -tingling of the fingers or toes, or other nerve disorder -an unusual or allergic  reaction to paclitaxel, alcohol, polyoxyethylated castor oil, other chemotherapy, other medicines, foods, dyes, or preservatives -pregnant or trying to get pregnant -breast-feeding How should I use this medicine? This drug is given as an infusion into a vein. It is administered in a hospital or clinic by a specially trained health care professional. Talk to your pediatrician regarding the use of this medicine in children. Special care may be needed. Overdosage: If you think you have taken too much of this medicine contact a poison control center or emergency room at once. NOTE: This medicine is only for you. Do not share this medicine with others. What if I miss a dose? It is important not to miss your dose. Call your doctor or health care professional if you are unable to keep an appointment. What may interact with this medicine? Do not take this medicine with any of the following medications: -disulfiram -metronidazole This medicine may also interact with the following medications: -antiviral medicines for hepatitis, HIV or AIDS -certain antibiotics like erythromycin and clarithromycin -certain medicines for fungal infections like ketoconazole and itraconazole -certain medicines for seizures like carbamazepine, phenobarbital, phenytoin -gemfibrozil -nefazodone -rifampin -St. John's wort This list may not describe all possible interactions. Give your health care provider a list of all the medicines, herbs, non-prescription drugs, or dietary supplements you use. Also tell them if you smoke, drink alcohol, or use illegal drugs. Some items may interact with your medicine. What should I watch for while using this medicine? Your condition will be monitored carefully while you are receiving this medicine. You will need important blood work done while you are taking this medicine.  This medicine can cause serious allergic reactions. To reduce your risk you will need to take other medicine(s)  before treatment with this medicine. If you experience allergic reactions like skin rash, itching or hives, swelling of the face, lips, or tongue, tell your doctor or health care professional right away. In some cases, you may be given additional medicines to help with side effects. Follow all directions for their use. This drug may make you feel generally unwell. This is not uncommon, as chemotherapy can affect healthy cells as well as cancer cells. Report any side effects. Continue your course of treatment even though you feel ill unless your doctor tells you to stop. Call your doctor or health care professional for advice if you get a fever, chills or sore throat, or other symptoms of a cold or flu. Do not treat yourself. This drug decreases your body's ability to fight infections. Try to avoid being around people who are sick. This medicine may increase your risk to bruise or bleed. Call your doctor or health care professional if you notice any unusual bleeding. Be careful brushing and flossing your teeth or using a toothpick because you may get an infection or bleed more easily. If you have any dental work done, tell your dentist you are receiving this medicine. Avoid taking products that contain aspirin, acetaminophen, ibuprofen, naproxen, or ketoprofen unless instructed by your doctor. These medicines may hide a fever. Do not become pregnant while taking this medicine. Women should inform their doctor if they wish to become pregnant or think they might be pregnant. There is a potential for serious side effects to an unborn child. Talk to your health care professional or pharmacist for more information. Do not breast-feed an infant while taking this medicine. Men are advised not to father a child while receiving this medicine. This product may contain alcohol. Ask your pharmacist or healthcare provider if this medicine contains alcohol. Be sure to tell all healthcare providers you are taking this  medicine. Certain medicines, like metronidazole and disulfiram, can cause an unpleasant reaction when taken with alcohol. The reaction includes flushing, headache, nausea, vomiting, sweating, and increased thirst. The reaction can last from 30 minutes to several hours. What side effects may I notice from receiving this medicine? Side effects that you should report to your doctor or health care professional as soon as possible: -allergic reactions like skin rash, itching or hives, swelling of the face, lips, or tongue -breathing problems -changes in vision -fast, irregular heartbeat -high or low blood pressure -mouth sores -pain, tingling, numbness in the hands or feet -signs of decreased platelets or bleeding - bruising, pinpoint red spots on the skin, black, tarry stools, blood in the urine -signs of decreased red blood cells - unusually weak or tired, feeling faint or lightheaded, falls -signs of infection - fever or chills, cough, sore throat, pain or difficulty passing urine -signs and symptoms of liver injury like dark yellow or brown urine; general ill feeling or flu-like symptoms; light-colored stools; loss of appetite; nausea; right upper belly pain; unusually weak or tired; yellowing of the eyes or skin -swelling of the ankles, feet, hands -unusually slow heartbeat Side effects that usually do not require medical attention (report to your doctor or health care professional if they continue or are bothersome): -diarrhea -hair loss -loss of appetite -muscle or joint pain -nausea, vomiting -pain, redness, or irritation at site where injected -tiredness This list may not describe all possible side effects. Call your doctor for medical  advice about side effects. You may report side effects to FDA at 1-800-FDA-1088. Where should I keep my medicine? This drug is given in a hospital or clinic and will not be stored at home. NOTE: This sheet is a summary. It may not cover all possible  information. If you have questions about this medicine, talk to your doctor, pharmacist, or health care provider.  2019 Elsevier/Gold Standard (2017-04-10 13:14:55)

## 2018-11-20 NOTE — Progress Notes (Signed)
Per Dr. Lindi Adie, ok to treat with HR 108.

## 2018-11-20 NOTE — Assessment & Plan Note (Signed)
08/23/2018:Right lumpectomy: IDC, 0.7 cm, grade 3, margins negative, 0/3 lymph nodes negative, ER 40%, PR 0%, HER-2 negative, Ki-67 50%, T1BN0 stage Ia  Oncotype Dx 44: High Risk  Treatment plan: 1.Adj Chemo with Dose dense adriamycin and Cytoxan foll by Taxol weekly X 12 2. Adjuvant radiation therapy followed by 3. Adjuvant antiestrogen therapy ------------------------------------------------------------------------------------------------------------ Current Treatment: Completed 4 cyclesdose dense Adriamycin and Cytoxan, today cycle 1 of Taxol  Chemo Toxicities: 1.Nausea in spite of antiemetics: Reduce the dosage of chemo for cycle 4 2.Fatigue: Profound fatigue 3.Leg cramps 4.Headaches 5.Scalp tenderness 6.Mild thrombocytopenia: Platelet count is adequate for treatment today. We will continue to watch and monitor this.   RTC in1 week for cycle 2 Taxol

## 2018-11-22 ENCOUNTER — Telehealth: Payer: Self-pay | Admitting: General Practice

## 2018-11-22 ENCOUNTER — Encounter: Payer: Self-pay | Admitting: General Practice

## 2018-11-22 NOTE — Telephone Encounter (Signed)
[facility] CSW Progress Notes  Call from patient, wants to drop off materials for [place] applications on Weds when at infusion.  Pt will give these to infusion room staff - pls let [facility] know to come get them if possible.  Edwyna Shell, LCSW Clinical Social Worker Phone:  [phone]

## 2018-11-25 NOTE — Progress Notes (Signed)
Patient Care Team: [name], [name], MD as PCP - General (Family Medicine) Erroll Luna, MD as Consulting Physician (General Surgery) Nicholas Lose, MD as Consulting Physician (Hematology and Oncology) Kyung Rudd, MD as Consulting Physician (Radiation Oncology)  DIAGNOSIS:    ICD-10-CM   1. Malignant neoplasm of lower-inner quadrant of right breast of female, estrogen receptor positive ([place]) C50.311    Z17.0     SUMMARY OF ONCOLOGIC HISTORY:   Malignant neoplasm of lower-inner quadrant of right breast of female, estrogen receptor positive ([facility])   07/16/2018 Initial Diagnosis    Pain right breast UOQ: Negative on mammogram, incidentally found right breast mass LIQ at 3:30 position measuring 0.6 cm biopsy revealed grade 3 IDC ER 40%, PR 0%, HER-2 negative, Ki-67 50%, T1bN0 stage Ib    07/24/2018 Cancer Staging    Staging form: Breast, AJCC 8th Edition - Clinical: Stage IB (cT1b, cN0, cM0, G3, ER+, PR-, HER2-) - Signed by Nicholas Lose, MD on 07/24/2018    08/23/2018 Surgery    Right lumpectomy: IDC, 0.7 cm, grade 3, margins negative, 0/3 lymph nodes negative, ER 40%, PR 0%, HER-2 negative, Ki-67 50%, T1BN0 stage Ia    09/04/2018 Cancer Staging    Staging form: Breast, AJCC 8th Edition - Pathologic: Stage IA (pT1b, pN0, cM0, G3, ER+, PR-, HER2-) - Signed by Gardenia Phlegm, NP on 09/04/2018    09/25/2018 -  Chemotherapy    Adjuvant chemotherapy with dose dense Adriamycin and Cytoxan x4 followed by Taxol weekly x12.       CHIEF COMPLIANT: Cycle 2 Taxol  INTERVAL HISTORY: [name] [name] is a 53 y.o. with above-mentioned history of right breast cancer treated with lumpectomywho completed 4 cycles of neoadjuvant dose dense Adriamycin and Cytoxan and is currently receiving weekly Taxol treatments. She presents to the clinicalonetoday for cycle 2.   REVIEW OF SYSTEMS:   Constitutional: Denies fevers, chills or abnormal weight loss Eyes: Denies blurriness of vision  Ears, nose, mouth, throat, and face: Denies mucositis or sore throat Respiratory: Denies cough, dyspnea or wheezes Cardiovascular: Denies palpitation, chest discomfort Gastrointestinal: Denies nausea, heartburn or change in bowel habits Skin: Denies abnormal skin rashes Lymphatics: Denies new lymphadenopathy or easy bruising Neurological: Denies numbness, tingling or new weaknesses Behavioral/Psych: Mood is stable, no new changes  Extremities: No lower extremity edema Breast: denies any pain or lumps or nodules in either breasts All other systems were reviewed with the patient and are negative.  I have reviewed the past medical history, past surgical history, social history and family history with the patient and they are unchanged from previous note.  ALLERGIES:  is allergic to ibuprofen; lisinopril; and dilaudid [hydromorphone hcl].  MEDICATIONS:  Current Outpatient Medications  Medication Sig Dispense Refill  . Cholecalciferol (VITAMIN D3) 5000 UNITS TABS Take 1 tablet by mouth daily.    . clobetasol ointment (TEMOVATE) 1.61 % Apply 1 application topically 2 (two) times daily. Then as needed for 30 days  1  . Flaxseed, Linseed, (FLAX SEED OIL PO) Take 5 mLs by mouth every morning.    . gabapentin (NEURONTIN) 300 MG capsule Take 1 capsule (300 mg total) by mouth 2 (two) times daily. 20 capsule 1  . lidocaine (XYLOCAINE) 2 % solution Use as directed 5 mLs in the mouth or throat every 3 (three) hours as needed for mouth pain (swish and swallow of spit). 200 mL 1  . lidocaine-prilocaine (EMLA) cream Apply to affected area once 30 g 3  . LORazepam (ATIVAN) 0.5  MG tablet Take 1 tablet (0.5 mg total) by mouth at bedtime as needed for sleep. 30 tablet 0  . naproxen sodium (ALEVE) 220 MG tablet Take 660 mg by mouth daily as needed (pain).    . Olmesartan-amLODIPine-HCTZ 40-10-25 MG TABS Take 1 tablet by mouth daily.    . omeprazole (PRILOSEC) 40 MG capsule Take 1 capsule (40 mg total) by mouth  daily. 30 capsule 0  . ondansetron (ZOFRAN) 8 MG tablet Take 1 tablet (8 mg total) by mouth 2 (two) times daily as needed. Start on the third day after chemotherapy. 30 tablet 1  . prochlorperazine (COMPAZINE) 10 MG tablet Take 1 tablet (10 mg total) by mouth every 6 (six) hours as needed (Nausea or vomiting). 30 tablet 1  . promethazine (PHENERGAN) 25 MG tablet Take 1 tablet (25 mg total) by mouth every 6 (six) hours as needed for nausea. 30 tablet 3  . tetrahydrozoline-zinc (VISINE-AC) 0.05-0.25 % ophthalmic solution Place 2 drops into both eyes as needed (dry, itchy eyes).     No current facility-administered medications for this visit.     PHYSICAL EXAMINATION: ECOG PERFORMANCE STATUS: 1 - Symptomatic but completely ambulatory  Vitals:   11/27/18 0932  BP: 114/78  Pulse: (!) 112  Resp: 18  Temp: 98.9 F (37.2 C)  SpO2: 100%   Filed Weights   11/27/18 0932  Weight: 253 lb 14.4 oz (115.2 kg)    GENERAL: alert, no distress and comfortable SKIN: skin color, texture, turgor are normal, no rashes or significant lesions EYES: normal, Conjunctiva are pink and non-injected, sclera clear OROPHARYNX: no exudate, no erythema and lips, buccal mucosa, and tongue normal  NECK: supple, thyroid normal size, non-tender, without nodularity LYMPH: no palpable lymphadenopathy in the cervical, axillary or inguinal LUNGS: clear to auscultation and percussion with normal breathing effort HEART: regular rate & rhythm and no murmurs and no lower extremity edema ABDOMEN: abdomen soft, non-tender and normal bowel sounds MUSCULOSKELETAL: no cyanosis of digits and no clubbing  NEURO: alert & oriented x 3 with fluent speech, no focal motor/sensory deficits EXTREMITIES: No lower extremity edema  LABORATORY DATA:  I have reviewed the data as listed CMP Latest Ref Rng & Units 11/20/2018 11/06/2018 10/23/2018  Glucose 70 - 99 mg/dL 106(H) 111(H) 107(H)  BUN 6 - 20 mg/dL 8 13 11  Creatinine 0.44 - 1.00 mg/dL  0.82 0.84 0.90  Sodium 135 - 145 mmol/L 140 142 140  Potassium 3.5 - 5.1 mmol/L 3.1(L) 3.2(L) 3.6  Chloride 98 - 111 mmol/L 102 105 105  CO2 22 - 32 mmol/L 26 25 26  Calcium 8.9 - 10.3 mg/dL 9.6 9.2 9.1  Total Protein 6.5 - 8.1 g/dL 7.0 7.1 7.1  Total Bilirubin 0.3 - 1.2 mg/dL 0.4 0.3 0.4  Alkaline Phos 38 - 126 U/L 86 77 103  AST 15 - 41 U/L 17 16 25  ALT 0 - 44 U/L 19 16 28    Lab Results  Component Value Date   WBC 4.6 11/27/2018   HGB 9.8 (L) 11/27/2018   HCT 29.7 (L) 11/27/2018   MCV 93.7 11/27/2018   PLT 202 11/27/2018   NEUTROABS 3.3 11/27/2018    ASSESSMENT & PLAN:  Malignant neoplasm of lower-inner quadrant of right breast of female, estrogen receptor positive ([facility]) 08/23/2018:Right lumpectomy: IDC, 0.7 cm, grade 3, margins negative, 0/3 lymph nodes negative, ER 40%, PR 0%, HER-2 negative, Ki-67 50%, T1BN0 stage Ia  Oncotype Dx 44: High Risk  Treatment plan: 1.Adj Chemo with   Dose dense adriamycin and Cytoxan foll by Taxol weekly X 12 2. Adjuvant radiation therapy followed by 3. Adjuvant antiestrogen therapy ------------------------------------------------------------------------------------------------------------ Current Treatment: Completed 4 cyclesdose dense Adriamycin and Cytoxan, today cycle 2 of Taxol  Taxol toxicities: 1.Fatigue: Profound fatigue 2.Mild thrombocytopenia: Platelet count improved remarkably.  Today it is 202 3.  Chemotherapy-induced anemia: Today's hemoglobin is 9.8 monitoring closely. 4.  Lower back pain after chemo that lasted 2 days felt like urinary infection but she did not have any fevers or any other problems.  Patient is very concerned that this could happen once again with this treatment. We will have to monitor her symptoms.  Patient will start icing her fingers and toes to prevent neuropathy.   RTC weekly for Taxol and every other week for follow-up with me.    No orders of the defined types were placed in this  encounter.  The patient has a good understanding of the overall plan. she agrees with it. she will call with any problems that may develop before the next visit here.  Rulon Eisenmenger, MD 11/27/2018  Julious Oka Dorshimer am acting as scribe for Dr. Nicholas Lose.  I have reviewed the above documentation for accuracy and completeness, and I agree with the above.

## 2018-11-26 NOTE — Assessment & Plan Note (Signed)
08/23/2018:Right lumpectomy: IDC, 0.7 cm, grade 3, margins negative, 0/3 lymph nodes negative, ER 40%, PR 0%, HER-2 negative, Ki-67 50%, T1BN0 stage Ia  Oncotype Dx 44: High Risk  Treatment plan: 1.Adj Chemo with Dose dense adriamycin and Cytoxan foll by Taxol weekly X 12 2. Adjuvant radiation therapy followed by 3. Adjuvant antiestrogen therapy ------------------------------------------------------------------------------------------------------------ Current Treatment: Completed 4 cyclesdose dense Adriamycin and Cytoxan, today cycle 2 of Taxol  Taxol toxicities: 1.Fatigue: Profound fatigue 2.Mild thrombocytopenia: Platelet count is adequate for treatment today. We will continue to watch and monitor this.   RTC weekly for Taxol and every other week for follow-up with me.

## 2018-11-27 ENCOUNTER — Other Ambulatory Visit: Payer: Self-pay

## 2018-11-27 ENCOUNTER — Inpatient Hospital Stay: Payer: BLUE CROSS/BLUE SHIELD

## 2018-11-27 ENCOUNTER — Inpatient Hospital Stay (HOSPITAL_BASED_OUTPATIENT_CLINIC_OR_DEPARTMENT_OTHER): Payer: BLUE CROSS/BLUE SHIELD | Admitting: Hematology and Oncology

## 2018-11-27 ENCOUNTER — Inpatient Hospital Stay (HOSPITAL_BASED_OUTPATIENT_CLINIC_OR_DEPARTMENT_OTHER): Payer: BLUE CROSS/BLUE SHIELD | Admitting: Medical

## 2018-11-27 DIAGNOSIS — C50311 Malignant neoplasm of lower-inner quadrant of right female breast: Secondary | ICD-10-CM

## 2018-11-27 DIAGNOSIS — Z79899 Other long term (current) drug therapy: Secondary | ICD-10-CM

## 2018-11-27 DIAGNOSIS — Z170 Estrogen receptor positive status [ER+]: Secondary | ICD-10-CM

## 2018-11-27 DIAGNOSIS — Z95828 Presence of other vascular implants and grafts: Secondary | ICD-10-CM

## 2018-11-27 DIAGNOSIS — D696 Thrombocytopenia, unspecified: Secondary | ICD-10-CM

## 2018-11-27 LAB — CMP (CANCER CENTER ONLY)
ALT: 16 U/L (ref 0–44)
AST: 17 U/L (ref 15–41)
Albumin: 3.8 g/dL (ref 3.5–5.0)
Alkaline Phosphatase: 65 U/L (ref 38–126)
Anion gap: 10 (ref 5–15)
BUN: 11 mg/dL (ref 6–20)
CO2: 27 mmol/L (ref 22–32)
Calcium: 9.2 mg/dL (ref 8.9–10.3)
Chloride: 105 mmol/L (ref 98–111)
Creatinine: 0.83 mg/dL (ref 0.44–1.00)
GFR, Est AFR Am: >60 mL/min (ref >60)
GFR, Estimated: >60 mL/min (ref >60)
Glucose, Bld: 109 mg/dL — ABNORMAL HIGH (ref 70–99)
Potassium: 3.3 mmol/L — ABNORMAL LOW (ref 3.5–5.1)
Sodium: 142 mmol/L (ref 135–145)
Total Bilirubin: 0.3 mg/dL (ref 0.3–1.2)
Total Protein: 7 g/dL (ref 6.5–8.1)

## 2018-11-27 LAB — CBC WITH DIFFERENTIAL (CANCER CENTER ONLY)
Abs Immature Granulocytes: 0.03 10*3/uL (ref 0.00–0.07)
Basophils Absolute: 0 10*3/uL (ref 0.0–0.1)
Basophils Relative: 1 %
Eosinophils Absolute: 0 10*3/uL (ref 0.0–0.5)
Eosinophils Relative: 0 %
HCT: 29.7 % — ABNORMAL LOW (ref 36.0–46.0)
Hemoglobin: 9.8 g/dL — ABNORMAL LOW (ref 12.0–15.0)
Immature Granulocytes: 1 %
Lymphocytes Relative: 16 %
Lymphs Abs: 0.7 10*3/uL (ref 0.7–4.0)
MCH: 30.9 pg (ref 26.0–34.0)
MCHC: 33 g/dL (ref 30.0–36.0)
MCV: 93.7 fL (ref 80.0–100.0)
Monocytes Absolute: 0.4 10*3/uL (ref 0.1–1.0)
Monocytes Relative: 9 %
Neutro Abs: 3.3 10*3/uL (ref 1.7–7.7)
Neutrophils Relative %: 73 %
Platelet Count: 202 10*3/uL (ref 150–400)
RBC: 3.17 MIL/uL — ABNORMAL LOW (ref 3.87–5.11)
RDW: 15.9 % — ABNORMAL HIGH (ref 11.5–15.5)
WBC Count: 4.6 10*3/uL (ref 4.0–10.5)
nRBC: 0.4 % — ABNORMAL HIGH (ref 0.0–0.2)

## 2018-11-27 MED ORDER — FAMOTIDINE IN NACL 20-0.9 MG/50ML-% IV SOLN: 20.0000 mg | Freq: Once | INTRAVENOUS | Status: DC

## 2018-11-27 MED ORDER — DIPHENHYDRAMINE HCL 50 MG/ML IJ SOLN
50.0000 mg | Freq: Once | INTRAMUSCULAR | Status: AC
  Administered 2018-11-27: 11:00:00 50 mg via INTRAVENOUS

## 2018-11-27 MED ORDER — HEPARIN SOD (PORK) LOCK FLUSH 100 UNIT/ML IV SOLN
500.0000 [IU] | Freq: Once | INTRAVENOUS | Status: AC | PRN
  Administered 2018-11-27: 14:00:00 500 [IU]
  Filled 2018-11-27: qty 5

## 2018-11-27 MED ORDER — SODIUM CHLORIDE 0.9 % IV SOLN
Freq: Once | INTRAVENOUS | Status: AC
  Administered 2018-11-27: 10:00:00 via INTRAVENOUS
  Filled 2018-11-27: qty 250

## 2018-11-27 MED ORDER — DIPHENHYDRAMINE HCL 50 MG/ML IJ SOLN
INTRAMUSCULAR | Status: AC
  Filled 2018-11-27: qty 1

## 2018-11-27 MED ORDER — SODIUM CHLORIDE 0.9% FLUSH
10.0000 mL | Freq: Once | INTRAVENOUS | Status: AC
  Administered 2018-11-27: 10 mL
  Filled 2018-11-27: qty 10

## 2018-11-27 MED ORDER — SODIUM CHLORIDE 0.9 % IV SOLN
20.0000 mg | Freq: Once | INTRAVENOUS | Status: AC
  Administered 2018-11-27: 12:00:00 20 mg via INTRAVENOUS
  Filled 2018-11-27: qty 20

## 2018-11-27 MED ORDER — SODIUM CHLORIDE 0.9 % IV SOLN
80.0000 mg/m2 | Freq: Once | INTRAVENOUS | Status: AC
  Administered 2018-11-27: 186 mg via INTRAVENOUS
  Filled 2018-11-27: qty 31

## 2018-11-27 MED ORDER — SODIUM CHLORIDE 0.9 % IV SOLN
20.0000 mg | Freq: Once | INTRAVENOUS | Status: AC
  Administered 2018-11-27: 11:00:00 20 mg via INTRAVENOUS
  Filled 2018-11-27: qty 2

## 2018-11-27 MED ORDER — SODIUM CHLORIDE 0.9% FLUSH
10.0000 mL | INTRAVENOUS | Status: DC | PRN
  Administered 2018-11-27: 14:00:00 10 mL
  Filled 2018-11-27: qty 10

## 2018-11-27 NOTE — Patient Instructions (Signed)
[facility] Discharge Instructions for Patients Receiving Chemotherapy  Today you received the following chemotherapy agents :  Taxol.  To help prevent nausea and vomiting after your treatment, we encourage you to take your nausea medication as prescribed.   If you develop nausea and vomiting that is not controlled by your nausea medication, call the clinic.   BELOW ARE SYMPTOMS THAT SHOULD BE REPORTED IMMEDIATELY:  *FEVER GREATER THAN 100.5 F  *CHILLS WITH OR WITHOUT FEVER  NAUSEA AND VOMITING THAT IS NOT CONTROLLED WITH YOUR NAUSEA MEDICATION  *UNUSUAL SHORTNESS OF BREATH  *UNUSUAL BRUISING OR BLEEDING  TENDERNESS IN MOUTH AND THROAT WITH OR WITHOUT PRESENCE OF ULCERS  *URINARY PROBLEMS  *BOWEL PROBLEMS  UNUSUAL RASH Items with * indicate a potential emergency and should be followed up as soon as possible.  Feel free to call the clinic should you have any questions or concerns. The clinic phone number is [phone].  Please show the CHEMO ALERT CARD at check-in to the Emergency Department and triage nurse.   

## 2018-11-27 NOTE — Progress Notes (Signed)
Per Dr Lindi Adie OK to treat with elevated HR of 113

## 2018-11-27 NOTE — Patient Instructions (Signed)

## 2018-11-28 NOTE — Progress Notes (Signed)
The patient was seen in the infusion room today.  She reported having burning at her right chest wall Port-A-Cath while she was receiving a saline infusion.  The Port-A-Cath was examined.  There was no evidence of infiltration.  There was good blood return and good flow of IV saline.  The patient was reassured and was told to make her nurse aware should this continue.  She was able to receive her chemotherapy today.  It was offered that a dye study could be scheduled if needed.  Sandi Mealy, MHS, PA-C Physician Assistant

## 2018-12-04 ENCOUNTER — Encounter: Payer: Self-pay | Admitting: General Practice

## 2018-12-04 ENCOUNTER — Inpatient Hospital Stay: Payer: BLUE CROSS/BLUE SHIELD

## 2018-12-04 ENCOUNTER — Other Ambulatory Visit: Payer: Self-pay

## 2018-12-04 VITALS — BP 114/68 | HR 103 | Temp 98.3°F | Resp 18

## 2018-12-04 DIAGNOSIS — C50311 Malignant neoplasm of lower-inner quadrant of right female breast: Secondary | ICD-10-CM

## 2018-12-04 DIAGNOSIS — Z170 Estrogen receptor positive status [ER+]: Secondary | ICD-10-CM

## 2018-12-04 DIAGNOSIS — Z95828 Presence of other vascular implants and grafts: Secondary | ICD-10-CM

## 2018-12-04 LAB — CBC WITH DIFFERENTIAL (CANCER CENTER ONLY)
Abs Immature Granulocytes: 0.05 10*3/uL (ref 0.00–0.07)
Basophils Absolute: 0 10*3/uL (ref 0.0–0.1)
Basophils Relative: 1 %
Eosinophils Absolute: 0 10*3/uL (ref 0.0–0.5)
Eosinophils Relative: 1 %
HCT: 29.7 % — ABNORMAL LOW (ref 36.0–46.0)
Hemoglobin: 9.7 g/dL — ABNORMAL LOW (ref 12.0–15.0)
Immature Granulocytes: 1 %
Lymphocytes Relative: 16 %
Lymphs Abs: 0.7 10*3/uL (ref 0.7–4.0)
MCH: 31.1 pg (ref 26.0–34.0)
MCHC: 32.7 g/dL (ref 30.0–36.0)
MCV: 95.2 fL (ref 80.0–100.0)
Monocytes Absolute: 0.3 10*3/uL (ref 0.1–1.0)
Monocytes Relative: 6 %
Neutro Abs: 3.3 10*3/uL (ref 1.7–7.7)
Neutrophils Relative %: 75 %
Platelet Count: 179 10*3/uL (ref 150–400)
RBC: 3.12 MIL/uL — ABNORMAL LOW (ref 3.87–5.11)
RDW: 17 % — ABNORMAL HIGH (ref 11.5–15.5)
WBC Count: 4.4 10*3/uL (ref 4.0–10.5)
nRBC: 1.1 % — ABNORMAL HIGH (ref 0.0–0.2)

## 2018-12-04 LAB — CMP (CANCER CENTER ONLY)
ALT: 21 U/L (ref 0–44)
AST: 23 U/L (ref 15–41)
Albumin: 3.7 g/dL (ref 3.5–5.0)
Alkaline Phosphatase: 67 U/L (ref 38–126)
Anion gap: 12 (ref 5–15)
BUN: 9 mg/dL (ref 6–20)
CO2: 25 mmol/L (ref 22–32)
Calcium: 9.3 mg/dL (ref 8.9–10.3)
Chloride: 105 mmol/L (ref 98–111)
Creatinine: 0.84 mg/dL (ref 0.44–1.00)
GFR, Est AFR Am: >60 mL/min (ref >60)
GFR, Estimated: >60 mL/min (ref >60)
Glucose, Bld: 129 mg/dL — ABNORMAL HIGH (ref 70–99)
Potassium: 3.3 mmol/L — ABNORMAL LOW (ref 3.5–5.1)
Sodium: 142 mmol/L (ref 135–145)
Total Bilirubin: 0.4 mg/dL (ref 0.3–1.2)
Total Protein: 7 g/dL (ref 6.5–8.1)

## 2018-12-04 MED ORDER — SODIUM CHLORIDE 0.9% FLUSH
10.0000 mL | INTRAVENOUS | Status: DC | PRN
  Administered 2018-12-04: 12:00:00 10 mL
  Filled 2018-12-04: qty 10

## 2018-12-04 MED ORDER — SODIUM CHLORIDE 0.9% FLUSH
10.0000 mL | Freq: Once | INTRAVENOUS | Status: AC
  Administered 2018-12-04: 10 mL
  Filled 2018-12-04: qty 10

## 2018-12-04 MED ORDER — HEPARIN SOD (PORK) LOCK FLUSH 100 UNIT/ML IV SOLN
500.0000 [IU] | Freq: Once | INTRAVENOUS | Status: AC | PRN
  Administered 2018-12-04: 500 [IU]
  Filled 2018-12-04: qty 5

## 2018-12-04 MED ORDER — SODIUM CHLORIDE 0.9 % IV SOLN
20.0000 mg | Freq: Once | INTRAVENOUS | Status: AC
  Administered 2018-12-04: 20 mg via INTRAVENOUS
  Filled 2018-12-04: qty 2

## 2018-12-04 MED ORDER — SODIUM CHLORIDE 0.9 % IV SOLN
80.0000 mg/m2 | Freq: Once | INTRAVENOUS | Status: AC
  Administered 2018-12-04: 186 mg via INTRAVENOUS
  Filled 2018-12-04: qty 31

## 2018-12-04 MED ORDER — DIPHENHYDRAMINE HCL 50 MG/ML IJ SOLN
INTRAMUSCULAR | Status: AC
  Filled 2018-12-04: qty 1

## 2018-12-04 MED ORDER — ACETAMINOPHEN 325 MG PO TABS
ORAL_TABLET | ORAL | Status: AC
  Filled 2018-12-04: qty 2

## 2018-12-04 MED ORDER — FAMOTIDINE IN NACL 20-0.9 MG/50ML-% IV SOLN: 20.0000 mg | Freq: Once | INTRAVENOUS | Status: DC

## 2018-12-04 MED ORDER — SODIUM CHLORIDE 0.9 % IV SOLN
Freq: Once | INTRAVENOUS | Status: AC
  Administered 2018-12-04: 10:00:00 via INTRAVENOUS
  Filled 2018-12-04: qty 250

## 2018-12-04 MED ORDER — ACETAMINOPHEN 325 MG PO TABS
650.0000 mg | ORAL_TABLET | Freq: Once | ORAL | Status: AC
  Administered 2018-12-04: 650 mg via ORAL

## 2018-12-04 MED ORDER — DIPHENHYDRAMINE HCL 50 MG/ML IJ SOLN
50.0000 mg | Freq: Once | INTRAMUSCULAR | Status: AC
  Administered 2018-12-04: 10:00:00 50 mg via INTRAVENOUS

## 2018-12-04 MED ORDER — SODIUM CHLORIDE 0.9 % IV SOLN
20.0000 mg | Freq: Once | INTRAVENOUS | Status: AC
  Administered 2018-12-04: 20 mg via INTRAVENOUS
  Filled 2018-12-04: qty 20

## 2018-12-04 NOTE — Progress Notes (Signed)
After administration of IV Benadryl (see eMAR) and at the end of IVPB Dexamethasone (see eMAR), pt c/o back pain. She stated that it happened with her dexamethasone during her last treatment but that she did not report it to the RN. MD Gud[place] notified. Per MD Lindi Adie, administer Tylenol (see eMAR) and continue with treatment plan. MD states that Dexamethasone will likely be excluded from further treatment days. Pt verbalizes understanding and agrees with plan of care. Will continue to monitor.

## 2018-12-04 NOTE — Progress Notes (Signed)
[facility] CSW Progress Notes  Location manager for [employer] (mail) and [employer] [place] (scanned secure email) submitted on patient behalf.  Patient notified that [facility] application is missing the following documents:  Letter from employer stating that she was working at time of diagnosis and now has less income, copy of first two pages of last  Year's tax return and copies of checking/savings account statements.  Patient will wait until application is reviewed by both foundations and see if they request further documentation to process application.  Edwyna Shell, LCSW Clinical Social Worker Phone:  [phone]

## 2018-12-04 NOTE — Progress Notes (Signed)
Received a call from [place], RN in infusion with pt stating that while pt is receiving IVPB decadron she is experiencing back pain, pt states this happens every time she receives the decadron regardless of IV push or IVPB.  Per Dr. Lindi Adie, discontinue decadron from pt treatment plan and to administer 650 mg p.o tylenol today.  Maggie in the pharmacy notified and verbalized that she will remove decadron orders from pt current and future treatments.

## 2018-12-04 NOTE — Patient Instructions (Signed)
Coronavirus (COVID-19) Are you at risk?  Are you at risk for the Coronavirus (COVID-19)?  To be considered HIGH RISK for Coronavirus (COVID-19), you have to meet the following criteria:  . Traveled to [place], [place], [name], [place] or [place]; or in the [place] to [place], [place], [place], or [place]; and have fever, cough, and shortness of breath within the last 2 weeks of travel OR . Been in close contact with a person diagnosed with COVID-19 within the last 2 weeks and have fever, cough, and shortness of breath . IF YOU DO NOT MEET THESE CRITERIA, YOU ARE CONSIDERED LOW RISK FOR COVID-19.  What to do if you are HIGH RISK for COVID-19?  . If you are having a medical emergency, call 911. . Seek medical care right away. Before you go to a doctor's office, urgent care or emergency department, call ahead and tell them about your recent travel, contact with someone diagnosed with COVID-19, and your symptoms. You should receive instructions from your physician's office regarding next steps of care.  . When you arrive at healthcare provider, tell the healthcare staff immediately you have returned from visiting [place], [place], [place], [place] or [name]; or traveled in the [place] to [place], [place], [place], or [place]; in the last two weeks or you have been in close contact with a person diagnosed with COVID-19 in the last 2 weeks.   . Tell the health care staff about your symptoms: fever, cough and shortness of breath. . After you have been seen by a medical provider, you will be either: o Tested for (COVID-19) and discharged home on quarantine except to seek medical care if symptoms worsen, and asked to  - Stay home and avoid contact with others until you get your results (4-5 days)  - Avoid travel on public transportation if possible (such as bus, train, or airplane) or o Sent to the Emergency Department by EMS for evaluation, COVID-19 testing, and possible  admission depending on your condition and test results.  What to do if you are LOW RISK for COVID-19?  Reduce your risk of any infection by using the same precautions used for avoiding the common cold or flu:  . Wash your hands often with soap and warm water for at least 20 seconds.  If soap and water are not readily available, use an alcohol-based hand sanitizer with at least 60% alcohol.  . If coughing or sneezing, cover your mouth and nose by coughing or sneezing into the elbow areas of your shirt or coat, into a tissue or into your sleeve (not your hands). . Avoid shaking hands with others and consider head nods or verbal greetings only. . Avoid touching your eyes, nose, or mouth with unwashed hands.  . Avoid close contact with people who are sick. . Avoid places or events with large numbers of people in one location, like concerts or sporting events. . Carefully consider travel plans you have or are making. . If you are planning any travel outside or inside the [place], visit the CDC's Travelers' Health webpage for the latest health notices. . If you have some symptoms but not all symptoms, continue to monitor at home and seek medical attention if your symptoms worsen. . If you are having a medical emergency, call 911.   ADDITIONAL HEALTHCARE OPTIONS FOR PATIENTS  [facility] / e-Visit: [email or web address]         [facility]: [phone]  [facility]: [phone]                   [facility]: [phone]   [facility] Discharge Instructions for Patients Receiving Chemotherapy  Today you received the following chemotherapy agents Taxol   To help prevent nausea and vomiting after your treatment, we encourage you to take your nausea medication as directed.    If you develop nausea and vomiting that is not controlled by your nausea medication, call the clinic.   BELOW ARE  SYMPTOMS THAT SHOULD BE REPORTED IMMEDIATELY:  *FEVER GREATER THAN 100.5 F  *CHILLS WITH OR WITHOUT FEVER  NAUSEA AND VOMITING THAT IS NOT CONTROLLED WITH YOUR NAUSEA MEDICATION  *UNUSUAL SHORTNESS OF BREATH  *UNUSUAL BRUISING OR BLEEDING  TENDERNESS IN MOUTH AND THROAT WITH OR WITHOUT PRESENCE OF ULCERS  *URINARY PROBLEMS  *BOWEL PROBLEMS  UNUSUAL RASH Items with * indicate a potential emergency and should be followed up as soon as possible.  Feel free to call the clinic should you have any questions or concerns. The clinic phone number is [phone].  Please show the CHEMO ALERT CARD at check-in to the Emergency Department and triage nurse.   

## 2018-12-10 ENCOUNTER — Telehealth: Payer: Self-pay | Admitting: General Practice

## 2018-12-10 NOTE — Telephone Encounter (Signed)
[facility] CSW Progress Notes  Call from patient - she has letter that needs to be added to her applications for financial assistance and will be in infusion 4/22.  CSW will ask CSW Wallis Bamberg to connect w patient and get letter which can then be forwarded to appropriate financial aid organization.  Edwyna Shell, LCSW Clinical Social Worker Phone:  [phone]

## 2018-12-10 NOTE — Progress Notes (Signed)
Patient Care Team: [name], [name], MD as PCP - General (Family Medicine) Erroll Luna, MD as Consulting Physician (General Surgery) Nicholas Lose, MD as Consulting Physician (Hematology and Oncology) Kyung Rudd, MD as Consulting Physician (Radiation Oncology)  DIAGNOSIS:    ICD-10-CM   1. Malignant neoplasm of lower-inner quadrant of right breast of female, estrogen receptor positive ([place]) C50.311    Z17.0     SUMMARY OF ONCOLOGIC HISTORY:   Malignant neoplasm of lower-inner quadrant of right breast of female, estrogen receptor positive ([place])   07/16/2018 Initial Diagnosis    Pain right breast UOQ: Negative on mammogram, incidentally found right breast mass LIQ at 3:30 position measuring 0.6 cm biopsy revealed grade 3 IDC ER 40%, PR 0%, HER-2 negative, Ki-67 50%, T1bN0 stage Ib    07/24/2018 Cancer Staging    Staging form: Breast, AJCC 8th Edition - Clinical: Stage IB (cT1b, cN0, cM0, G3, ER+, PR-, HER2-) - Signed by Nicholas Lose, MD on 07/24/2018    08/23/2018 Surgery    Right lumpectomy: IDC, 0.7 cm, grade 3, margins negative, 0/3 lymph nodes negative, ER 40%, PR 0%, HER-2 negative, Ki-67 50%, T1BN0 stage Ia    09/04/2018 Cancer Staging    Staging form: Breast, AJCC 8th Edition - Pathologic: Stage IA (pT1b, pN0, cM0, G3, ER+, PR-, HER2-) - Signed by Gardenia Phlegm, NP on 09/04/2018    09/25/2018 -  Chemotherapy    Adjuvant chemotherapy with dose dense Adriamycin and Cytoxan x4 followed by Taxol weekly x12.       CHIEF COMPLIANT: Cycle 4 Taxol  INTERVAL HISTORY: [name] [name] is a 53 y.o. with above-mentioned history of right breast cancer treated with lumpectomywhocompleted 4 cycles of neoadjuvant dose dense Adriamycin and Cytoxan and is currently receiving weekly Taxol treatments.She presents to the clinicalonetoday for cycle 4.   REVIEW OF SYSTEMS:   Constitutional: Denies fevers, chills or abnormal weight loss Eyes: Denies blurriness of vision  Ears, nose, mouth, throat, and face: Denies mucositis or sore throat Respiratory: Denies cough, dyspnea or wheezes Cardiovascular: Denies palpitation, chest discomfort Gastrointestinal: Denies nausea, heartburn or change in bowel habits Skin: Denies abnormal skin rashes Lymphatics: Denies new lymphadenopathy or easy bruising Neurological: Denies numbness, tingling or new weaknesses Behavioral/Psych: Mood is stable, no new changes  Extremities: No lower extremity edema Breast: denies any pain or lumps or nodules in either breasts All other systems were reviewed with the patient and are negative.  I have reviewed the past medical history, past surgical history, social history and family history with the patient and they are unchanged from previous note.  ALLERGIES:  is allergic to ibuprofen; lisinopril; and dilaudid [hydromorphone hcl].  MEDICATIONS:  Current Outpatient Medications  Medication Sig Dispense Refill  . Cholecalciferol (VITAMIN D3) 5000 UNITS TABS Take 1 tablet by mouth daily.    . clobetasol ointment (TEMOVATE) 1.61 % Apply 1 application topically 2 (two) times daily. Then as needed for 30 days  1  . Flaxseed, Linseed, (FLAX SEED OIL PO) Take 5 mLs by mouth every morning.    . gabapentin (NEURONTIN) 300 MG capsule Take 1 capsule (300 mg total) by mouth 2 (two) times daily. 20 capsule 1  . lidocaine (XYLOCAINE) 2 % solution Use as directed 5 mLs in the mouth or throat every 3 (three) hours as needed for mouth pain (swish and swallow of spit). 200 mL 1  . lidocaine-prilocaine (EMLA) cream Apply to affected area once 30 g 3  . LORazepam (ATIVAN) 0.5 MG tablet  Take 1 tablet (0.5 mg total) by mouth at bedtime as needed for sleep. 30 tablet 0  . naproxen sodium (ALEVE) 220 MG tablet Take 660 mg by mouth daily as needed (pain).    . Olmesartan-amLODIPine-HCTZ 40-10-25 MG TABS Take 1 tablet by mouth daily.    Marland Kitchen omeprazole (PRILOSEC) 40 MG capsule Take 1 capsule (40 mg total) by mouth  daily. 30 capsule 0  . ondansetron (ZOFRAN) 8 MG tablet Take 1 tablet (8 mg total) by mouth 2 (two) times daily as needed. Start on the third day after chemotherapy. 30 tablet 1  . prochlorperazine (COMPAZINE) 10 MG tablet Take 1 tablet (10 mg total) by mouth every 6 (six) hours as needed (Nausea or vomiting). 30 tablet 1  . promethazine (PHENERGAN) 25 MG tablet Take 1 tablet (25 mg total) by mouth every 6 (six) hours as needed for nausea. 30 tablet 3  . tetrahydrozoline-zinc (VISINE-AC) 0.05-0.25 % ophthalmic solution Place 2 drops into both eyes as needed (dry, itchy eyes).     No current facility-administered medications for this visit.     PHYSICAL EXAMINATION: ECOG PERFORMANCE STATUS: 1 - Symptomatic but completely ambulatory  Vitals:   12/11/18 0855  BP: 125/78  Pulse: (!) 107  Resp: 17  Temp: 99.3 F (37.4 C)  SpO2: 100%   Filed Weights   12/11/18 0855  Weight: 252 lb 11.2 oz (114.6 kg)    GENERAL: alert, no distress and comfortable SKIN: skin color, texture, turgor are normal, no rashes or significant lesions EYES: normal, Conjunctiva are pink and non-injected, sclera clear OROPHARYNX: no exudate, no erythema and lips, buccal mucosa, and tongue normal  NECK: supple, thyroid normal size, non-tender, without nodularity LYMPH: no palpable lymphadenopathy in the cervical, axillary or inguinal LUNGS: clear to auscultation and percussion with normal breathing effort HEART: regular rate & rhythm and no murmurs and no lower extremity edema ABDOMEN: abdomen soft, non-tender and normal bowel sounds MUSCULOSKELETAL: no cyanosis of digits and no clubbing  NEURO: alert & oriented x 3 with fluent speech, no focal motor/sensory deficits EXTREMITIES: No lower extremity edema  LABORATORY DATA:  I have reviewed the data as listed CMP Latest Ref Rng & Units 12/04/2018 11/27/2018 11/20/2018  Glucose 70 - 99 mg/dL 129(H) 109(H) 106(H)  BUN 6 - 20 mg/dL _0 Creatinine 0.44 - 1.00 mg/dL  0.84 0.83 0.82  Sodium 135 - 145 mmol/L 142 142 140  Potassium 3.5 - 5.1 mmol/L 3.3(L) 3.3(L) 3.1(L)  Chloride 98 - 111 mmol/L 105 105 102  CO2 22 - 32 mmol/L _1 Calcium 8.9 - 10.3 mg/dL 9.3 9.2 9.6  Total Protein 6.5 - 8.1 g/dL 7.0 7.0 7.0  Total Bilirubin 0.3 - 1.2 mg/dL 0.4 0.3 0.4  Alkaline Phos 38 - 126 U/L 67 65 86  AST 15 - 41 U/L _2 ALT 0 - 44 U/L _3 Lab Results  Component Value Date   WBC 4.9 12/11/2018   HGB 10.0 (L) 12/11/2018   HCT 30.1 (L) 12/11/2018   MCV 93.8 12/11/2018   PLT 190 12/11/2018   NEUTROABS 3.6 12/11/2018    ASSESSMENT & PLAN:  Malignant neoplasm of lower-inner quadrant of right breast of female, estrogen receptor positive ([facility]) 08/23/2018:Right lumpectomy: IDC, 0.7 cm, grade 3, margins negative, 0/3 lymph nodes negative, ER 40%, PR 0%, HER-2 negative, Ki-67 50%, T1BN0 stage Ia  Oncotype Dx 44: High Risk  Treatment plan: 1.Adj Chemo with Dose dense  adriamycin and Cytoxan foll by Taxol weekly X 12 2. Adjuvant radiation therapy followed by 3. Adjuvant antiestrogen therapy ------------------------------------------------------------------------------------------------------------ Current Treatment:Completed 4 cyclesdose dense Adriamycin and Cytoxan, today cycle 4 of Taxol  Taxol toxicities: 1.Fatigue: Profound fatigue 2.Mild thrombocytopenia: Platelet count improved remarkably. 3.  Chemotherapy-induced anemia: Today's hemoglobin is 10 monitoring closely. 4.  Lower back pain after chemo. Appears to be slightly better 5.  Chemo-induced peripheral neuropathy: We will decrease the dosage of Taxol today.  I will also discontinue Pepcid because it is causing her stomach upset.  We will have to monitor her symptoms.  Patient is icing her fingers and toes to prevent neuropathy.  RTC weekly for Taxol and every other week for follow-up with me.     No orders of the defined types were placed in this encounter.  The  patient has a good understanding of the overall plan. she agrees with it. she will call with any problems that may develop before the next visit here.  Nicholas Lose, MD 12/11/2018  Julious Oka Dorshimer am acting as scribe for Dr. Nicholas Lose.  I have reviewed the above documentation for accuracy and completeness, and I agree with the above.

## 2018-12-11 ENCOUNTER — Inpatient Hospital Stay: Payer: BLUE CROSS/BLUE SHIELD

## 2018-12-11 ENCOUNTER — Inpatient Hospital Stay (HOSPITAL_BASED_OUTPATIENT_CLINIC_OR_DEPARTMENT_OTHER): Payer: BLUE CROSS/BLUE SHIELD | Admitting: Hematology and Oncology

## 2018-12-11 ENCOUNTER — Other Ambulatory Visit: Payer: Self-pay

## 2018-12-11 VITALS — HR 107

## 2018-12-11 DIAGNOSIS — D696 Thrombocytopenia, unspecified: Secondary | ICD-10-CM

## 2018-12-11 DIAGNOSIS — Z170 Estrogen receptor positive status [ER+]: Secondary | ICD-10-CM

## 2018-12-11 DIAGNOSIS — C50311 Malignant neoplasm of lower-inner quadrant of right female breast: Secondary | ICD-10-CM

## 2018-12-11 DIAGNOSIS — T451X5A Adverse effect of antineoplastic and immunosuppressive drugs, initial encounter: Secondary | ICD-10-CM

## 2018-12-11 DIAGNOSIS — D6481 Anemia due to antineoplastic chemotherapy: Secondary | ICD-10-CM

## 2018-12-11 DIAGNOSIS — Z95828 Presence of other vascular implants and grafts: Secondary | ICD-10-CM

## 2018-12-11 DIAGNOSIS — Z79899 Other long term (current) drug therapy: Secondary | ICD-10-CM

## 2018-12-11 LAB — CMP (CANCER CENTER ONLY)
ALT: 23 U/L (ref 0–44)
AST: 21 U/L (ref 15–41)
Albumin: 3.7 g/dL (ref 3.5–5.0)
Alkaline Phosphatase: 71 U/L (ref 38–126)
Anion gap: 11 (ref 5–15)
BUN: 11 mg/dL (ref 6–20)
CO2: 26 mmol/L (ref 22–32)
Calcium: 9.3 mg/dL (ref 8.9–10.3)
Chloride: 103 mmol/L (ref 98–111)
Creatinine: 0.85 mg/dL (ref 0.44–1.00)
GFR, Est AFR Am: >60 mL/min (ref >60)
GFR, Estimated: >60 mL/min (ref >60)
Glucose, Bld: 126 mg/dL — ABNORMAL HIGH (ref 70–99)
Potassium: 3.3 mmol/L — ABNORMAL LOW (ref 3.5–5.1)
Sodium: 140 mmol/L (ref 135–145)
Total Bilirubin: 0.4 mg/dL (ref 0.3–1.2)
Total Protein: 6.9 g/dL (ref 6.5–8.1)

## 2018-12-11 LAB — CBC WITH DIFFERENTIAL (CANCER CENTER ONLY)
Abs Immature Granulocytes: 0.04 10*3/uL (ref 0.00–0.07)
Basophils Absolute: 0 10*3/uL (ref 0.0–0.1)
Basophils Relative: 0 %
Eosinophils Absolute: 0.1 10*3/uL (ref 0.0–0.5)
Eosinophils Relative: 1 %
HCT: 30.1 % — ABNORMAL LOW (ref 36.0–46.0)
Hemoglobin: 10 g/dL — ABNORMAL LOW (ref 12.0–15.0)
Immature Granulocytes: 1 %
Lymphocytes Relative: 16 %
Lymphs Abs: 0.8 10*3/uL (ref 0.7–4.0)
MCH: 31.2 pg (ref 26.0–34.0)
MCHC: 33.2 g/dL (ref 30.0–36.0)
MCV: 93.8 fL (ref 80.0–100.0)
Monocytes Absolute: 0.3 10*3/uL (ref 0.1–1.0)
Monocytes Relative: 7 %
Neutro Abs: 3.6 10*3/uL (ref 1.7–7.7)
Neutrophils Relative %: 75 %
Platelet Count: 190 10*3/uL (ref 150–400)
RBC: 3.21 MIL/uL — ABNORMAL LOW (ref 3.87–5.11)
RDW: 16.6 % — ABNORMAL HIGH (ref 11.5–15.5)
WBC Count: 4.9 10*3/uL (ref 4.0–10.5)
nRBC: 1.4 % — ABNORMAL HIGH (ref 0.0–0.2)

## 2018-12-11 MED ORDER — SODIUM CHLORIDE 0.9 % IV SOLN
Freq: Once | INTRAVENOUS | Status: AC
  Administered 2018-12-11: 09:00:00 via INTRAVENOUS
  Filled 2018-12-11: qty 250

## 2018-12-11 MED ORDER — HEPARIN SOD (PORK) LOCK FLUSH 100 UNIT/ML IV SOLN
500.0000 [IU] | Freq: Once | INTRAVENOUS | Status: AC | PRN
  Administered 2018-12-11: 500 [IU]
  Filled 2018-12-11: qty 5

## 2018-12-11 MED ORDER — SODIUM CHLORIDE 0.9% FLUSH
10.0000 mL | INTRAVENOUS | Status: DC | PRN
  Administered 2018-12-11: 11:00:00 10 mL
  Filled 2018-12-11: qty 10

## 2018-12-11 MED ORDER — FAMOTIDINE IN NACL 20-0.9 MG/50ML-% IV SOLN: 20.0000 mg | Freq: Once | INTRAVENOUS | Status: DC

## 2018-12-11 MED ORDER — DIPHENHYDRAMINE HCL 50 MG/ML IJ SOLN
50.0000 mg | Freq: Once | INTRAMUSCULAR | Status: AC
  Administered 2018-12-11: 10:00:00 50 mg via INTRAVENOUS

## 2018-12-11 MED ORDER — SODIUM CHLORIDE 0.9 % IV SOLN
60.0000 mg/m2 | Freq: Once | INTRAVENOUS | Status: AC
  Administered 2018-12-11: 10:00:00 138 mg via INTRAVENOUS
  Filled 2018-12-11: qty 23

## 2018-12-11 MED ORDER — DIPHENHYDRAMINE HCL 50 MG/ML IJ SOLN
INTRAMUSCULAR | Status: AC
  Filled 2018-12-11: qty 1

## 2018-12-11 MED ORDER — SODIUM CHLORIDE 0.9% FLUSH
10.0000 mL | Freq: Once | INTRAVENOUS | Status: AC
  Administered 2018-12-11: 10 mL
  Filled 2018-12-11: qty 10

## 2018-12-11 NOTE — Assessment & Plan Note (Signed)
08/23/2018:Right lumpectomy: IDC, 0.7 cm, grade 3, margins negative, 0/3 lymph nodes negative, ER 40%, PR 0%, HER-2 negative, Ki-67 50%, T1BN0 stage Ia  Oncotype Dx 44: High Risk  Treatment plan: 1.Adj Chemo with Dose dense adriamycin and Cytoxan foll by Taxol weekly X 12 2. Adjuvant radiation therapy followed by 3. Adjuvant antiestrogen therapy ------------------------------------------------------------------------------------------------------------ Current Treatment:Completed 4 cyclesdose dense Adriamycin and Cytoxan, today cycle 4 of Taxol  Taxol toxicities: 1.Fatigue: Profound fatigue 2.Mild thrombocytopenia: Platelet count improved remarkably. 3.  Chemotherapy-induced anemia: Today's hemoglobin is 9.8 monitoring closely. 4.  Lower back pain after chemo that lasted 2 days felt like urinary infection but she did not have any fevers or any other problems.  Patient is very concerned that this could happen once again with this treatment. We will have to monitor her symptoms.  Patient is icing her fingers and toes to prevent neuropathy.  RTC weekly for Taxol and every other week for follow-up with me.

## 2018-12-11 NOTE — Progress Notes (Signed)
Per Dr. Lindi Adie okay to treat with HR 107

## 2018-12-11 NOTE — Patient Instructions (Signed)
[facility] Discharge Instructions for Patients Receiving Chemotherapy  Today you received the following chemotherapy agents Paclitaxel (TAXOL).  To help prevent nausea and vomiting after your treatment, we encourage you to take your nausea medication as prescribed.  If you develop nausea and vomiting that is not controlled by your nausea medication, call the clinic.   BELOW ARE SYMPTOMS THAT SHOULD BE REPORTED IMMEDIATELY:  *FEVER GREATER THAN 100.5 F  *CHILLS WITH OR WITHOUT FEVER  NAUSEA AND VOMITING THAT IS NOT CONTROLLED WITH YOUR NAUSEA MEDICATION  *UNUSUAL SHORTNESS OF BREATH  *UNUSUAL BRUISING OR BLEEDING  TENDERNESS IN MOUTH AND THROAT WITH OR WITHOUT PRESENCE OF ULCERS  *URINARY PROBLEMS  *BOWEL PROBLEMS  UNUSUAL RASH Items with * indicate a potential emergency and should be followed up as soon as possible.  Feel free to call the clinic should you have any questions or concerns. The clinic phone number is [phone].  Please show the CHEMO ALERT CARD at check-in to the Emergency Department and triage nurse.  Coronavirus (COVID-19) Are you at risk?  Are you at risk for the Coronavirus (COVID-19)?  To be considered HIGH RISK for Coronavirus (COVID-19), you have to meet the following criteria:  . Traveled to [place], [place], [name], [place] or [place]; or in the [place] to [place], [place], [place], or [place]; and have fever, cough, and shortness of breath within the last 2 weeks of travel OR . Been in close contact with a person diagnosed with COVID-19 within the last 2 weeks and have fever, cough, and shortness of breath . IF YOU DO NOT MEET THESE CRITERIA, YOU ARE CONSIDERED LOW RISK FOR COVID-19.  What to do if you are HIGH RISK for COVID-19?  . If you are having a medical emergency, call 911. . Seek medical care right away. Before you go to a doctor's office, urgent care or emergency department, call ahead and tell them about  your recent travel, contact with someone diagnosed with COVID-19, and your symptoms. You should receive instructions from your physician's office regarding next steps of care.  . When you arrive at healthcare provider, tell the healthcare staff immediately you have returned from visiting [place], [place], [place], [place] or [name]; or traveled in the [place] to [place], [place], [place], or [place]; in the last two weeks or you have been in close contact with a person diagnosed with COVID-19 in the last 2 weeks.   . Tell the health care staff about your symptoms: fever, cough and shortness of breath. . After you have been seen by a medical provider, you will be either: o Tested for (COVID-19) and discharged home on quarantine except to seek medical care if symptoms worsen, and asked to  - Stay home and avoid contact with others until you get your results (4-5 days)  - Avoid travel on public transportation if possible (such as bus, train, or airplane) or o Sent to the Emergency Department by EMS for evaluation, COVID-19 testing, and possible admission depending on your condition and test results.  What to do if you are LOW RISK for COVID-19?  Reduce your risk of any infection by using the same precautions used for avoiding the common cold or flu:  . Wash your hands often with soap and warm water for at least 20 seconds.  If soap and water are not readily available, use an alcohol-based hand sanitizer with at least 60% alcohol.  . If coughing or sneezing,   cover your mouth and nose by coughing or sneezing into the elbow areas of your shirt or coat, into a tissue or into your sleeve (not your hands). . Avoid shaking hands with others and consider head nods or verbal greetings only. . Avoid touching your eyes, nose, or mouth with unwashed hands.  . Avoid close contact with people who are sick. . Avoid places or events with large numbers of people in one location, like concerts or sporting  events. . Carefully consider travel plans you have or are making. . If you are planning any travel outside or inside the [place], visit the CDC's Travelers' Health webpage for the latest health notices. . If you have some symptoms but not all symptoms, continue to monitor at home and seek medical attention if your symptoms worsen. . If you are having a medical emergency, call 911.   ADDITIONAL HEALTHCARE OPTIONS FOR PATIENTS   Telehealth / e-Visit: [email or web address]         [facility]: [phone]  [facility]: [phone]                   [facility]: [phone]     

## 2018-12-18 ENCOUNTER — Inpatient Hospital Stay: Payer: BLUE CROSS/BLUE SHIELD

## 2018-12-18 ENCOUNTER — Other Ambulatory Visit: Payer: Self-pay

## 2018-12-18 ENCOUNTER — Encounter: Payer: Self-pay | Admitting: General Practice

## 2018-12-18 VITALS — BP 133/73 | HR 99 | Temp 98.7°F | Resp 20

## 2018-12-18 DIAGNOSIS — Z170 Estrogen receptor positive status [ER+]: Secondary | ICD-10-CM

## 2018-12-18 DIAGNOSIS — Z95828 Presence of other vascular implants and grafts: Secondary | ICD-10-CM

## 2018-12-18 DIAGNOSIS — C50311 Malignant neoplasm of lower-inner quadrant of right female breast: Secondary | ICD-10-CM

## 2018-12-18 LAB — CBC WITH DIFFERENTIAL (CANCER CENTER ONLY)
Abs Immature Granulocytes: 0.03 10*3/uL (ref 0.00–0.07)
Basophils Absolute: 0 10*3/uL (ref 0.0–0.1)
Basophils Relative: 0 %
Eosinophils Absolute: 0.1 10*3/uL (ref 0.0–0.5)
Eosinophils Relative: 1 %
HCT: 28.9 % — ABNORMAL LOW (ref 36.0–46.0)
Hemoglobin: 9.8 g/dL — ABNORMAL LOW (ref 12.0–15.0)
Immature Granulocytes: 1 %
Lymphocytes Relative: 16 %
Lymphs Abs: 0.8 10*3/uL (ref 0.7–4.0)
MCH: 32.2 pg (ref 26.0–34.0)
MCHC: 33.9 g/dL (ref 30.0–36.0)
MCV: 95.1 fL (ref 80.0–100.0)
Monocytes Absolute: 0.4 10*3/uL (ref 0.1–1.0)
Monocytes Relative: 8 %
Neutro Abs: 3.9 10*3/uL (ref 1.7–7.7)
Neutrophils Relative %: 74 %
Platelet Count: 140 10*3/uL — ABNORMAL LOW (ref 150–400)
RBC: 3.04 MIL/uL — ABNORMAL LOW (ref 3.87–5.11)
RDW: 16.3 % — ABNORMAL HIGH (ref 11.5–15.5)
WBC Count: 5.3 10*3/uL (ref 4.0–10.5)
nRBC: 1.1 % — ABNORMAL HIGH (ref 0.0–0.2)

## 2018-12-18 LAB — CMP (CANCER CENTER ONLY)
ALT: 30 U/L (ref 0–44)
AST: 24 U/L (ref 15–41)
Albumin: 3.7 g/dL (ref 3.5–5.0)
Alkaline Phosphatase: 74 U/L (ref 38–126)
Anion gap: 10 (ref 5–15)
BUN: 11 mg/dL (ref 6–20)
CO2: 26 mmol/L (ref 22–32)
Calcium: 9 mg/dL (ref 8.9–10.3)
Chloride: 104 mmol/L (ref 98–111)
Creatinine: 0.85 mg/dL (ref 0.44–1.00)
GFR, Est AFR Am: >60 mL/min (ref >60)
GFR, Estimated: >60 mL/min (ref >60)
Glucose, Bld: 95 mg/dL (ref 70–99)
Potassium: 3.4 mmol/L — ABNORMAL LOW (ref 3.5–5.1)
Sodium: 140 mmol/L (ref 135–145)
Total Bilirubin: 0.4 mg/dL (ref 0.3–1.2)
Total Protein: 7 g/dL (ref 6.5–8.1)

## 2018-12-18 MED ORDER — DIPHENHYDRAMINE HCL 50 MG/ML IJ SOLN
INTRAMUSCULAR | Status: AC
  Filled 2018-12-18: qty 1

## 2018-12-18 MED ORDER — SODIUM CHLORIDE 0.9% FLUSH
10.0000 mL | INTRAVENOUS | Status: DC | PRN
  Administered 2018-12-18: 10 mL
  Filled 2018-12-18: qty 10

## 2018-12-18 MED ORDER — SODIUM CHLORIDE 0.9 % IV SOLN
60.0000 mg/m2 | Freq: Once | INTRAVENOUS | Status: AC
  Administered 2018-12-18: 138 mg via INTRAVENOUS
  Filled 2018-12-18: qty 23

## 2018-12-18 MED ORDER — SODIUM CHLORIDE 0.9 % IV SOLN
Freq: Once | INTRAVENOUS | Status: AC
  Administered 2018-12-18: 09:00:00 via INTRAVENOUS
  Filled 2018-12-18: qty 250

## 2018-12-18 MED ORDER — SODIUM CHLORIDE 0.9% FLUSH
10.0000 mL | Freq: Once | INTRAVENOUS | Status: AC
  Administered 2018-12-18: 10 mL
  Filled 2018-12-18: qty 10

## 2018-12-18 MED ORDER — DIPHENHYDRAMINE HCL 50 MG/ML IJ SOLN
50.0000 mg | Freq: Once | INTRAMUSCULAR | Status: AC
  Administered 2018-12-18: 10:00:00 50 mg via INTRAVENOUS

## 2018-12-18 MED ORDER — HEPARIN SOD (PORK) LOCK FLUSH 100 UNIT/ML IV SOLN
500.0000 [IU] | Freq: Once | INTRAVENOUS | Status: AC | PRN
  Administered 2018-12-18: 500 [IU]
  Filled 2018-12-18: qty 5

## 2018-12-18 NOTE — Progress Notes (Signed)
[facility] CSW Progress Notes  Additional documents submitted to [employer] on patient behalf (letter from employer and pages of tax return as required).  Patient encouraged to participate in upcoming virtual support group.  Edwyna Shell, LCSW Clinical Social Worker Phone:  [phone]

## 2018-12-18 NOTE — Patient Instructions (Signed)
[facility] Discharge Instructions for Patients Receiving Chemotherapy  Today you received the following chemotherapy agents Paclitaxel (TAXOL).  To help prevent nausea and vomiting after your treatment, we encourage you to take your nausea medication as prescribed.  If you develop nausea and vomiting that is not controlled by your nausea medication, call the clinic.   BELOW ARE SYMPTOMS THAT SHOULD BE REPORTED IMMEDIATELY:  *FEVER GREATER THAN 100.5 F  *CHILLS WITH OR WITHOUT FEVER  NAUSEA AND VOMITING THAT IS NOT CONTROLLED WITH YOUR NAUSEA MEDICATION  *UNUSUAL SHORTNESS OF BREATH  *UNUSUAL BRUISING OR BLEEDING  TENDERNESS IN MOUTH AND THROAT WITH OR WITHOUT PRESENCE OF ULCERS  *URINARY PROBLEMS  *BOWEL PROBLEMS  UNUSUAL RASH Items with * indicate a potential emergency and should be followed up as soon as possible.  Feel free to call the clinic should you have any questions or concerns. The clinic phone number is [phone].  Please show the CHEMO ALERT CARD at check-in to the Emergency Department and triage nurse.  Coronavirus (COVID-19) Are you at risk?  Are you at risk for the Coronavirus (COVID-19)?  To be considered HIGH RISK for Coronavirus (COVID-19), you have to meet the following criteria:  . Traveled to [place], [place], [name], [place] or [place]; or in the [place] to [place], [place], [place], or [place]; and have fever, cough, and shortness of breath within the last 2 weeks of travel OR . Been in close contact with a person diagnosed with COVID-19 within the last 2 weeks and have fever, cough, and shortness of breath . IF YOU DO NOT MEET THESE CRITERIA, YOU ARE CONSIDERED LOW RISK FOR COVID-19.  What to do if you are HIGH RISK for COVID-19?  . If you are having a medical emergency, call 911. . Seek medical care right away. Before you go to a doctor's office, urgent care or emergency department, call ahead and tell them about  your recent travel, contact with someone diagnosed with COVID-19, and your symptoms. You should receive instructions from your physician's office regarding next steps of care.  . When you arrive at healthcare provider, tell the healthcare staff immediately you have returned from visiting [place], [place], [place], [place] or [name]; or traveled in the [place] to [place], [place], [place], or [place]; in the last two weeks or you have been in close contact with a person diagnosed with COVID-19 in the last 2 weeks.   . Tell the health care staff about your symptoms: fever, cough and shortness of breath. . After you have been seen by a medical provider, you will be either: o Tested for (COVID-19) and discharged home on quarantine except to seek medical care if symptoms worsen, and asked to  - Stay home and avoid contact with others until you get your results (4-5 days)  - Avoid travel on public transportation if possible (such as bus, train, or airplane) or o Sent to the Emergency Department by EMS for evaluation, COVID-19 testing, and possible admission depending on your condition and test results.  What to do if you are LOW RISK for COVID-19?  Reduce your risk of any infection by using the same precautions used for avoiding the common cold or flu:  . Wash your hands often with soap and warm water for at least 20 seconds.  If soap and water are not readily available, use an alcohol-based hand sanitizer with at least 60% alcohol.  . If coughing or sneezing,   cover your mouth and nose by coughing or sneezing into the elbow areas of your shirt or coat, into a tissue or into your sleeve (not your hands). . Avoid shaking hands with others and consider head nods or verbal greetings only. . Avoid touching your eyes, nose, or mouth with unwashed hands.  . Avoid close contact with people who are sick. . Avoid places or events with large numbers of people in one location, like concerts or sporting  events. . Carefully consider travel plans you have or are making. . If you are planning any travel outside or inside the [place], visit the CDC's Travelers' Health webpage for the latest health notices. . If you have some symptoms but not all symptoms, continue to monitor at home and seek medical attention if your symptoms worsen. . If you are having a medical emergency, call 911.   ADDITIONAL HEALTHCARE OPTIONS FOR PATIENTS  [facility] / e-Visit: [email or web address]         [facility]: [phone]  [facility]: [phone]                   [facility]: [phone]     

## 2018-12-19 ENCOUNTER — Encounter: Payer: Self-pay | Admitting: General Practice

## 2018-12-19 NOTE — Progress Notes (Signed)
[facility] CSW Progress Notes  Per email, application to [employer] received by funder, is under review.  Edwyna Shell, LCSW Clinical Social Worker Phone:  [phone]

## 2018-12-24 NOTE — Progress Notes (Signed)
Patient Care Team: [name], [name], MD as PCP - General (Family Medicine) Erroll Luna, MD as Consulting Physician (General Surgery) Nicholas Lose, MD as Consulting Physician (Hematology and Oncology) Kyung Rudd, MD as Consulting Physician (Radiation Oncology)  DIAGNOSIS:    ICD-10-CM   1. Malignant neoplasm of lower-inner quadrant of right breast of female, estrogen receptor positive ([place]) C50.311    Z17.0     SUMMARY OF ONCOLOGIC HISTORY:   Malignant neoplasm of lower-inner quadrant of right breast of female, estrogen receptor positive ([place])   07/16/2018 Initial Diagnosis    Pain right breast UOQ: Negative on mammogram, incidentally found right breast mass LIQ at 3:30 position measuring 0.6 cm biopsy revealed grade 3 IDC ER 40%, PR 0%, HER-2 negative, Ki-67 50%, T1bN0 stage Ib    07/24/2018 Cancer Staging    Staging form: Breast, AJCC 8th Edition - Clinical: Stage IB (cT1b, cN0, cM0, G3, ER+, PR-, HER2-) - Signed by Nicholas Lose, MD on 07/24/2018    08/23/2018 Surgery    Right lumpectomy: IDC, 0.7 cm, grade 3, margins negative, 0/3 lymph nodes negative, ER 40%, PR 0%, HER-2 negative, Ki-67 50%, T1BN0 stage Ia    09/04/2018 Cancer Staging    Staging form: Breast, AJCC 8th Edition - Pathologic: Stage IA (pT1b, pN0, cM0, G3, ER+, PR-, HER2-) - Signed by Gardenia Phlegm, NP on 09/04/2018    09/25/2018 -  Chemotherapy    Adjuvant chemotherapy with dose dense Adriamycin and Cytoxan x4 followed by Taxol weekly x12.       CHIEF COMPLIANT: Cycle 6 Taxol  INTERVAL HISTORY: [name] [name] is a 53 y.o. with above-mentioned history of right breast cancer treated with lumpectomywhocompleted 4 cycles of neoadjuvant dose dense Adriamycin and Cytoxan and is currently receiving weekly Taxol treatments.She presents to the clinicalonetodayfor cycle 6.   REVIEW OF SYSTEMS:   Constitutional: Denies fevers, chills or abnormal weight loss Eyes: Denies blurriness of vision  Ears, nose, mouth, throat, and face: Denies mucositis or sore throat Respiratory: Denies cough, dyspnea or wheezes Cardiovascular: Denies palpitation, chest discomfort Gastrointestinal: Denies nausea, heartburn or change in bowel habits Skin: Denies abnormal skin rashes Lymphatics: Denies new lymphadenopathy or easy bruising Neurological: Peripheral neuropathy mild Behavioral/Psych: Mood is stable, no new changes  Extremities: No lower extremity edema Breast: denies any pain or lumps or nodules in either breasts All other systems were reviewed with the patient and are negative.  I have reviewed the past medical history, past surgical history, social history and family history with the patient and they are unchanged from previous note.  ALLERGIES:  is allergic to ibuprofen; lisinopril; and dilaudid [hydromorphone hcl].  MEDICATIONS:  Current Outpatient Medications  Medication Sig Dispense Refill  . Cholecalciferol (VITAMIN D3) 5000 UNITS TABS Take 1 tablet by mouth daily.    . clobetasol ointment (TEMOVATE) 9.62 % Apply 1 application topically 2 (two) times daily. Then as needed for 30 days  1  . Flaxseed, Linseed, (FLAX SEED OIL PO) Take 5 mLs by mouth every morning.    . gabapentin (NEURONTIN) 300 MG capsule Take 1 capsule (300 mg total) by mouth 2 (two) times daily. 20 capsule 1  . lidocaine (XYLOCAINE) 2 % solution Use as directed 5 mLs in the mouth or throat every 3 (three) hours as needed for mouth pain (swish and swallow of spit). 200 mL 1  . lidocaine-prilocaine (EMLA) cream Apply to affected area once 30 g 3  . LORazepam (ATIVAN) 0.5 MG tablet Take 1 tablet (0.5  mg total) by mouth at bedtime as needed for sleep. 30 tablet 0  . naproxen sodium (ALEVE) 220 MG tablet Take 660 mg by mouth daily as needed (pain).    . Olmesartan-amLODIPine-HCTZ 40-10-25 MG TABS Take 1 tablet by mouth daily.    Marland Kitchen omeprazole (PRILOSEC) 40 MG capsule Take 1 capsule (40 mg total) by mouth daily. 30  capsule 0  . ondansetron (ZOFRAN) 8 MG tablet Take 1 tablet (8 mg total) by mouth 2 (two) times daily as needed. Start on the third day after chemotherapy. 30 tablet 1  . prochlorperazine (COMPAZINE) 10 MG tablet Take 1 tablet (10 mg total) by mouth every 6 (six) hours as needed (Nausea or vomiting). 30 tablet 1  . promethazine (PHENERGAN) 25 MG tablet Take 1 tablet (25 mg total) by mouth every 6 (six) hours as needed for nausea. 30 tablet 3  . tetrahydrozoline-zinc (VISINE-AC) 0.05-0.25 % ophthalmic solution Place 2 drops into both eyes as needed (dry, itchy eyes).     No current facility-administered medications for this visit.     PHYSICAL EXAMINATION: ECOG PERFORMANCE STATUS: 1 - Symptomatic but completely ambulatory  Vitals:   12/25/18 0848  BP: 117/75  Pulse: (!) 102  Resp: 18  Temp: 98 F (36.7 C)  SpO2: 100%   Filed Weights   12/25/18 0848  Weight: 254 lb 4.8 oz (115.3 kg)    GENERAL: alert, no distress and comfortable SKIN: skin color, texture, turgor are normal, no rashes or significant lesions EYES: normal, Conjunctiva are pink and non-injected, sclera clear OROPHARYNX: no exudate, no erythema and lips, buccal mucosa, and tongue normal  NECK: supple, thyroid normal size, non-tender, without nodularity LYMPH: no palpable lymphadenopathy in the cervical, axillary or inguinal LUNGS: clear to auscultation and percussion with normal breathing effort HEART: regular rate & rhythm and no murmurs and no lower extremity edema ABDOMEN: abdomen soft, non-tender and normal bowel sounds MUSCULOSKELETAL: no cyanosis of digits and no clubbing  NEURO: alert & oriented x 3 with fluent speech, mild peripheral neuropathy EXTREMITIES: No lower extremity edema  LABORATORY DATA:  I have reviewed the data as listed CMP Latest Ref Rng & Units 12/18/2018 12/11/2018 12/04/2018  Glucose 70 - 99 mg/dL 95 126(H) 129(H)  BUN 6 - 20 mg/dL _0 Creatinine 0.44 - 1.00 mg/dL 0.85 0.85 0.84   Sodium 135 - 145 mmol/L 140 140 142  Potassium 3.5 - 5.1 mmol/L 3.4(L) 3.3(L) 3.3(L)  Chloride 98 - 111 mmol/L 104 103 105  CO2 22 - 32 mmol/L _1 Calcium 8.9 - 10.3 mg/dL 9.0 9.3 9.3  Total Protein 6.5 - 8.1 g/dL 7.0 6.9 7.0  Total Bilirubin 0.3 - 1.2 mg/dL 0.4 0.4 0.4  Alkaline Phos 38 - 126 U/L 74 71 67  AST 15 - 41 U/L _2 ALT 0 - 44 U/L _3 Lab Results  Component Value Date   WBC 4.0 12/25/2018   HGB 9.8 (L) 12/25/2018   HCT 29.5 (L) 12/25/2018   MCV 97.0 12/25/2018   PLT 153 12/25/2018   NEUTROABS 2.7 12/25/2018    ASSESSMENT & PLAN:  Malignant neoplasm of lower-inner quadrant of right breast of female, estrogen receptor positive ([facility]) 08/23/2018:Right lumpectomy: IDC, 0.7 cm, grade 3, margins negative, 0/3 lymph nodes negative, ER 40%, PR 0%, HER-2 negative, Ki-67 50%, T1BN0 stage Ia  Oncotype Dx 44: High Risk  Treatment plan: 1.Adj Chemo with Dose dense adriamycin and Cytoxan foll by  Taxol weekly X 12 2. Adjuvant radiation therapy followed by 3. Adjuvant antiestrogen therapy ------------------------------------------------------------------------------------------------------------ Current Treatment:Completed 4 cyclesdose dense Adriamycin and Cytoxan, today cycle6of Taxol  Taxol toxicities: 1.Fatigue: Profound fatigue 2.Mild thrombocytopenia: Platelet count improved remarkably. 3.Chemotherapy-induced anemia: Today's hemoglobin is 10 monitoring closely. 4.Lower back pain after chemo. Appears to be slightly better 5.  Chemo-induced peripheral neuropathy: We will decrease the dosage of Taxol today.  I will also discontinue Pepcid because it is causing her stomach upset.  We will have to monitor her symptoms.  Patient is icing her fingers and toes to prevent neuropathy.  RTCweekly for Taxol and every other week for follow-up with me.    No orders of the defined types were placed in this encounter.  The patient has a good  understanding of the overall plan. she agrees with it. she will call with any problems that may develop before the next visit here.  Nicholas Lose, MD 12/25/2018  Julious Oka Dorshimer am acting as scribe for Dr. Nicholas Lose.  I have reviewed the above documentation for accuracy and completeness, and I agree with the above.

## 2018-12-25 ENCOUNTER — Encounter: Payer: Self-pay | Admitting: General Practice

## 2018-12-25 ENCOUNTER — Inpatient Hospital Stay: Payer: BC Managed Care – PPO

## 2018-12-25 ENCOUNTER — Inpatient Hospital Stay (HOSPITAL_BASED_OUTPATIENT_CLINIC_OR_DEPARTMENT_OTHER): Payer: BC Managed Care – PPO | Admitting: Hematology and Oncology

## 2018-12-25 ENCOUNTER — Other Ambulatory Visit: Payer: Self-pay

## 2018-12-25 ENCOUNTER — Inpatient Hospital Stay: Payer: BC Managed Care – PPO | Attending: Hematology and Oncology

## 2018-12-25 VITALS — HR 100

## 2018-12-25 DIAGNOSIS — C50311 Malignant neoplasm of lower-inner quadrant of right female breast: Secondary | ICD-10-CM

## 2018-12-25 DIAGNOSIS — T451X5A Adverse effect of antineoplastic and immunosuppressive drugs, initial encounter: Secondary | ICD-10-CM | POA: Diagnosis not present

## 2018-12-25 DIAGNOSIS — Z170 Estrogen receptor positive status [ER+]: Secondary | ICD-10-CM

## 2018-12-25 DIAGNOSIS — D696 Thrombocytopenia, unspecified: Secondary | ICD-10-CM | POA: Diagnosis not present

## 2018-12-25 DIAGNOSIS — R1084 Generalized abdominal pain: Secondary | ICD-10-CM | POA: Insufficient documentation

## 2018-12-25 DIAGNOSIS — Z79899 Other long term (current) drug therapy: Secondary | ICD-10-CM | POA: Diagnosis not present

## 2018-12-25 DIAGNOSIS — D6481 Anemia due to antineoplastic chemotherapy: Secondary | ICD-10-CM | POA: Insufficient documentation

## 2018-12-25 DIAGNOSIS — G620 Drug-induced polyneuropathy: Secondary | ICD-10-CM

## 2018-12-25 DIAGNOSIS — R140 Abdominal distension (gaseous): Secondary | ICD-10-CM | POA: Diagnosis not present

## 2018-12-25 DIAGNOSIS — Z95828 Presence of other vascular implants and grafts: Secondary | ICD-10-CM

## 2018-12-25 DIAGNOSIS — M25551 Pain in right hip: Secondary | ICD-10-CM | POA: Insufficient documentation

## 2018-12-25 DIAGNOSIS — Z5111 Encounter for antineoplastic chemotherapy: Secondary | ICD-10-CM | POA: Diagnosis not present

## 2018-12-25 DIAGNOSIS — R600 Localized edema: Secondary | ICD-10-CM | POA: Diagnosis not present

## 2018-12-25 LAB — CBC WITH DIFFERENTIAL (CANCER CENTER ONLY)
Abs Immature Granulocytes: 0.03 10*3/uL (ref 0.00–0.07)
Basophils Absolute: 0 10*3/uL (ref 0.0–0.1)
Basophils Relative: 1 %
Eosinophils Absolute: 0.1 10*3/uL (ref 0.0–0.5)
Eosinophils Relative: 3 %
HCT: 29.5 % — ABNORMAL LOW (ref 36.0–46.0)
Hemoglobin: 9.8 g/dL — ABNORMAL LOW (ref 12.0–15.0)
Immature Granulocytes: 1 %
Lymphocytes Relative: 19 %
Lymphs Abs: 0.7 10*3/uL (ref 0.7–4.0)
MCH: 32.2 pg (ref 26.0–34.0)
MCHC: 33.2 g/dL (ref 30.0–36.0)
MCV: 97 fL (ref 80.0–100.0)
Monocytes Absolute: 0.4 10*3/uL (ref 0.1–1.0)
Monocytes Relative: 10 %
Neutro Abs: 2.7 10*3/uL (ref 1.7–7.7)
Neutrophils Relative %: 66 %
Platelet Count: 153 10*3/uL (ref 150–400)
RBC: 3.04 MIL/uL — ABNORMAL LOW (ref 3.87–5.11)
RDW: 15.9 % — ABNORMAL HIGH (ref 11.5–15.5)
WBC Count: 4 10*3/uL (ref 4.0–10.5)
nRBC: 1.5 % — ABNORMAL HIGH (ref 0.0–0.2)

## 2018-12-25 LAB — CMP (CANCER CENTER ONLY)
ALT: 31 U/L (ref 0–44)
AST: 27 U/L (ref 15–41)
Albumin: 3.9 g/dL (ref 3.5–5.0)
Alkaline Phosphatase: 78 U/L (ref 38–126)
Anion gap: 9 (ref 5–15)
BUN: 9 mg/dL (ref 6–20)
CO2: 26 mmol/L (ref 22–32)
Calcium: 9.2 mg/dL (ref 8.9–10.3)
Chloride: 105 mmol/L (ref 98–111)
Creatinine: 0.82 mg/dL (ref 0.44–1.00)
GFR, Est AFR Am: >60 mL/min (ref >60)
GFR, Estimated: >60 mL/min (ref >60)
Glucose, Bld: 89 mg/dL (ref 70–99)
Potassium: 3.5 mmol/L (ref 3.5–5.1)
Sodium: 140 mmol/L (ref 135–145)
Total Bilirubin: 0.4 mg/dL (ref 0.3–1.2)
Total Protein: 7.1 g/dL (ref 6.5–8.1)

## 2018-12-25 MED ORDER — SODIUM CHLORIDE 0.9% FLUSH
10.0000 mL | Freq: Once | INTRAVENOUS | Status: DC
  Filled 2018-12-25: qty 10

## 2018-12-25 MED ORDER — DIPHENHYDRAMINE HCL 50 MG/ML IJ SOLN
50.0000 mg | Freq: Once | INTRAMUSCULAR | Status: AC
  Administered 2018-12-25: 10:00:00 50 mg via INTRAVENOUS

## 2018-12-25 MED ORDER — SODIUM CHLORIDE 0.9% FLUSH
10.0000 mL | INTRAVENOUS | Status: DC | PRN
  Administered 2018-12-25: 10 mL
  Filled 2018-12-25: qty 10

## 2018-12-25 MED ORDER — SODIUM CHLORIDE 0.9 % IV SOLN
Freq: Once | INTRAVENOUS | Status: AC
  Administered 2018-12-25: 10:00:00 via INTRAVENOUS
  Filled 2018-12-25: qty 250

## 2018-12-25 MED ORDER — DIPHENHYDRAMINE HCL 50 MG/ML IJ SOLN
INTRAMUSCULAR | Status: AC
  Filled 2018-12-25: qty 1

## 2018-12-25 MED ORDER — HEPARIN SOD (PORK) LOCK FLUSH 100 UNIT/ML IV SOLN
500.0000 [IU] | Freq: Once | INTRAVENOUS | Status: AC | PRN
  Administered 2018-12-25: 12:00:00 500 [IU]
  Filled 2018-12-25: qty 5

## 2018-12-25 MED ORDER — SODIUM CHLORIDE 0.9 % IV SOLN
60.0000 mg/m2 | Freq: Once | INTRAVENOUS | Status: AC
  Administered 2018-12-25: 138 mg via INTRAVENOUS
  Filled 2018-12-25: qty 23

## 2018-12-25 NOTE — Progress Notes (Signed)
[facility] CSW Progress Notes  Call from patient, wants to know about status of financial aid applications. Per communications from agencies, [employer] in [place] apps are under review.  Pt has not contacted [facility] to request their application yet - CSW encouraged her to do so and send to undersigned CSW so we can submit on her behalf.  Edwyna Shell, LCSW Clinical Social Worker Phone:  [phone]

## 2018-12-25 NOTE — Assessment & Plan Note (Signed)
08/23/2018:Right lumpectomy: IDC, 0.7 cm, grade 3, margins negative, 0/3 lymph nodes negative, ER 40%, PR 0%, HER-2 negative, Ki-67 50%, T1BN0 stage Ia  Oncotype Dx 44: High Risk  Treatment plan: 1.Adj Chemo with Dose dense adriamycin and Cytoxan foll by Taxol weekly X 12 2. Adjuvant radiation therapy followed by 3. Adjuvant antiestrogen therapy ------------------------------------------------------------------------------------------------------------ Current Treatment:Completed 4 cyclesdose dense Adriamycin and Cytoxan, today cycle6of Taxol  Taxol toxicities: 1.Fatigue: Profound fatigue 2.Mild thrombocytopenia: Platelet count improved remarkably. 3.Chemotherapy-induced anemia: Today's hemoglobin is 10 monitoring closely. 4.Lower back pain after chemo. Appears to be slightly better 5.  Chemo-induced peripheral neuropathy: We will decrease the dosage of Taxol today.  I will also discontinue Pepcid because it is causing her stomach upset.  We will have to monitor her symptoms.  Patient is icing her fingers and toes to prevent neuropathy.  RTCweekly for Taxol and every other week for follow-up with me.

## 2018-12-27 ENCOUNTER — Ambulatory Visit (HOSPITAL_COMMUNITY)
Admission: RE | Admit: 2018-12-27 | Discharge: 2018-12-27 | Disposition: A | Discharge status: Home / Self Care | Payer: BLUE CROSS/BLUE SHIELD | Source: Ambulatory Visit | Attending: Medical | Admitting: Medical

## 2018-12-27 ENCOUNTER — Telehealth: Payer: Self-pay | Admitting: *Deleted

## 2018-12-27 ENCOUNTER — Inpatient Hospital Stay (HOSPITAL_BASED_OUTPATIENT_CLINIC_OR_DEPARTMENT_OTHER): Payer: BC Managed Care – PPO | Admitting: Medical

## 2018-12-27 ENCOUNTER — Inpatient Hospital Stay: Payer: BC Managed Care – PPO

## 2018-12-27 ENCOUNTER — Other Ambulatory Visit: Payer: Self-pay

## 2018-12-27 ENCOUNTER — Other Ambulatory Visit: Payer: Self-pay | Admitting: Emergency Medicine

## 2018-12-27 VITALS — BP 125/70 | HR 104 | Temp 98.3°F | Resp 18 | Ht 66.0 in

## 2018-12-27 DIAGNOSIS — Z170 Estrogen receptor positive status [ER+]: Secondary | ICD-10-CM

## 2018-12-27 DIAGNOSIS — R600 Localized edema: Secondary | ICD-10-CM

## 2018-12-27 DIAGNOSIS — R1030 Lower abdominal pain, unspecified: Secondary | ICD-10-CM

## 2018-12-27 DIAGNOSIS — C50311 Malignant neoplasm of lower-inner quadrant of right female breast: Secondary | ICD-10-CM

## 2018-12-27 DIAGNOSIS — Z79899 Other long term (current) drug therapy: Secondary | ICD-10-CM

## 2018-12-27 DIAGNOSIS — M25551 Pain in right hip: Secondary | ICD-10-CM

## 2018-12-27 DIAGNOSIS — R1084 Generalized abdominal pain: Secondary | ICD-10-CM | POA: Insufficient documentation

## 2018-12-27 DIAGNOSIS — R140 Abdominal distension (gaseous): Secondary | ICD-10-CM | POA: Diagnosis present

## 2018-12-27 LAB — CBC WITH DIFFERENTIAL (CANCER CENTER ONLY)
Abs Immature Granulocytes: 0.01 10*3/uL (ref 0.00–0.07)
Basophils Absolute: 0 10*3/uL (ref 0.0–0.1)
Basophils Relative: 0 %
Eosinophils Absolute: 0 10*3/uL (ref 0.0–0.5)
Eosinophils Relative: 1 %
HCT: 30.8 % — ABNORMAL LOW (ref 36.0–46.0)
Hemoglobin: 10.1 g/dL — ABNORMAL LOW (ref 12.0–15.0)
Immature Granulocytes: 0 %
Lymphocytes Relative: 24 %
Lymphs Abs: 0.7 10*3/uL (ref 0.7–4.0)
MCH: 32 pg (ref 26.0–34.0)
MCHC: 32.8 g/dL (ref 30.0–36.0)
MCV: 97.5 fL (ref 80.0–100.0)
Monocytes Absolute: 0.2 10*3/uL (ref 0.1–1.0)
Monocytes Relative: 9 %
Neutro Abs: 1.8 10*3/uL (ref 1.7–7.7)
Neutrophils Relative %: 66 %
Platelet Count: 167 10*3/uL (ref 150–400)
RBC: 3.16 MIL/uL — ABNORMAL LOW (ref 3.87–5.11)
RDW: 15.6 % — ABNORMAL HIGH (ref 11.5–15.5)
WBC Count: 2.8 10*3/uL — ABNORMAL LOW (ref 4.0–10.5)
nRBC: 0.7 % — ABNORMAL HIGH (ref 0.0–0.2)

## 2018-12-27 LAB — CMP (CANCER CENTER ONLY)
ALT: 35 U/L (ref 0–44)
AST: 37 U/L (ref 15–41)
Albumin: 4 g/dL (ref 3.5–5.0)
Alkaline Phosphatase: 86 U/L (ref 38–126)
Anion gap: 10 (ref 5–15)
BUN: 12 mg/dL (ref 6–20)
CO2: 27 mmol/L (ref 22–32)
Calcium: 9.3 mg/dL (ref 8.9–10.3)
Chloride: 104 mmol/L (ref 98–111)
Creatinine: 0.85 mg/dL (ref 0.44–1.00)
GFR, Est AFR Am: >60 mL/min (ref >60)
GFR, Estimated: >60 mL/min (ref >60)
Glucose, Bld: 93 mg/dL (ref 70–99)
Potassium: 3.7 mmol/L (ref 3.5–5.1)
Sodium: 141 mmol/L (ref 135–145)
Total Bilirubin: 0.6 mg/dL (ref 0.3–1.2)
Total Protein: 7.4 g/dL (ref 6.5–8.1)

## 2018-12-27 MED ORDER — SULFAMETHOXAZOLE-TRIMETHOPRIM 800-160 MG PO TABS: 1.0000 | ORAL_TABLET | Freq: Two times a day (BID) | ORAL | 0 refills | Status: DC

## 2018-12-27 MED ORDER — PREDNISONE 5 MG PO TABS: ORAL_TABLET | ORAL | 0 refills | Status: DC

## 2018-12-27 MED ORDER — HYDROCODONE-ACETAMINOPHEN 5-325 MG PO TABS: 1.0000 | ORAL_TABLET | ORAL | 0 refills | Status: DC | PRN

## 2018-12-27 NOTE — Progress Notes (Signed)
Lower extremity venous has been completed.   Preliminary results in CV Proc.   Abram Sander 12/27/2018 3:02 PM

## 2018-12-27 NOTE — Telephone Encounter (Signed)
Received call from pt stating x 2 days she has been experiencing right abdominal pain that is radiating to her right groin.  Pt states that her abdomen is swollen and her right leg is also swollen.  Pt denies any recent injury or signs of infection including warmth, redness, or drainage.  Pt to see Sandi Mealy with [facility] today to be evaluated.  High priority message sent to scheduling and pt verbalized understanding that scheduling will call her to make apt for today.

## 2018-12-27 NOTE — Progress Notes (Signed)
[facility]pt[facility] Management Clinic Progress Note   [name] [name] [id number] [birth year] 53 y.o.  [name] [name] is managed by [name] [name]  Actively treated with chemotherapy/immunotherapy/hormonal therapy: yes  Current therapy: paclitaxel  Last treated: 12/25/2018 (cycle 6, day 1)  Next scheduled appointment with provider: 01/08/2019  Assessment: Plan:    Generalized abdominal pain - Plan: DG Abd 2 Views  Abdominal distention - Plan: DG Abd 2 Views  Edema of right lower extremity - Plan: VAS [name] LOWER EXTREMITY VENOUS (DVT)  Pain of right hip joint - Plan: HYDROcodone-acetaminophen (NORCO/VICODIN) 5-325 MG tablet, predniSONE (DELTASONE) 5 MG tablet, sulfamethoxazole-trimethoprim (BACTRIM DS) 800-160 MG tablet  Malignant neoplasm of lower-inner quadrant of right breast of female, estrogen receptor positive (HCC)   Generalized abdominal pain and distention: The patient was referred for an x-ray of her abdomen which returned showing:  Nonobstructive bowel gas pattern.  Mild-to-moderate colonic stool. She was given information regarding management of constipation.  Unilateral right lower extremity edema: Patient was referred for a bad Doppler ultrasound which returned negative for a DVT.  Right hip pain with swelling: The patient reports that she felt warmth at the area in her right hip.  She has had a history of an abscess in her buttock recently.  Because of this she was given a prescription for Bactrim DS p.o. twice daily x7 days in addition to a prednisone taper and Vicodin. She knows to call next week if she is no better at which time she would be referred for an x-ray of her right hip.  ER positive malignant neoplasm of the right breast: The patient continues to be followed by [name] [name] and is status post cycle 6 of paclitaxel which was dosed on 12/25/2018.  She is scheduled to return next week for cycle 7 of paclitaxel.  She will be seen in follow-up on  01/08/2019.  Please see After Visit Summary for patient specific instructions.  Future Appointments  Date Time Provider [place]  01/01/2019  8:15 AM [name] [name] 3 [name] None  01/01/2019  8:30 AM [name] [name] [name] None  01/01/2019  9:30 AM [name] [name] [name] None  01/08/2019  7:45 AM [name] [name] 3 [name] None  01/08/2019  8:00 AM [name] [place] [name] [name] None  01/08/2019  8:30 AM [name] Lose, MD [name] None  01/08/2019  9:30 AM [name] [name] [name] None  01/15/2019  8:15 AM [name] [name] 6 [name] None  01/15/2019  8:30 AM [name] [place] [name] [name] None  01/15/2019  9:30 AM [name] [name] [name] None  01/22/2019  8:15 AM [name] [name] 5 [name] None  01/22/2019  8:30 AM [name] [place] [name] [name] None  01/22/2019  9:00 AM [name] Lose, MD [name] None  01/22/2019 10:00 AM [name] [name] [name] None    Orders Placed This Encounter  Procedures   DG Abd 2 Views       Subjective:   Patient ID:  [name] [name] is a 53 y.o. (DOB February 10, 1966) female.  Chief Complaint:  Chief Complaint  Patient presents with   Abdominal Pain    HPI [name] [name] is a 53 year old female with a history of an ER positive malignant neoplasm of the right breast.  She is managed by [name] [name] and is status post cycle 6 of paclitaxel which was dosed on 12/25/2018.  She contacted our office earlier today stating that she was having abdominal distention and discomfort.  She also reported that she was having right unilateral lower extremity edema.  She was sent for an x-ray of her abdomen with results as noted above and for a vascular ultrasound of her right lower extremity with results as noted above.  She now reports that she is having right hip pain.  She denies any change in activity or trauma.  She states that she simply woke up with pain in her hip and right groin yesterday morning.   She has had a boil in her buttock area recently.  She denies fevers, chills, or sweats.  She does state that she feels that the area in her right groin has been warm.  Medications: I have reviewed the patient's current medications.  Allergies:  Allergies  Allergen Reactions   Ibuprofen Swelling    SWELLING REACTION UNSPECIFIED    Lisinopril Swelling    angioedema   Dilaudid [Hydromorphone Hcl] Nausea Only    Past Medical History:  Diagnosis Date   Allergy    seasonal   Anal fissure    Complication of anesthesia    Diverticulitis    GERD (gastroesophageal reflux disease) 07/07/2005   Hemorrhoid    Hepatic steatosis    Hypertension    Internal hemorrhoids    Malignant neoplasm of lower-inner quadrant of right breast of female, estrogen receptor positive ([place]) 07/16/2018   PONV (postoperative nausea and vomiting)    Positive H. pylori test     Past Surgical History:  Procedure Laterality Date   BREAST LUMPECTOMY WITH RADIOACTIVE SEED AND SENTINEL LYMPH NODE BIOPSY Right 08/23/2018   Procedure: RIGHT BREAST LUMPECTOMY WITH RADIOACTIVE SEED AND RIGHT SENTINEL LYMPH NODE MAPPING;  Surgeon: [name] Luna, MD;  Location: [facility];  Service: General;  Laterality: Right;   COLON RESECTION  2000's Dr. Hassell Done   Diverticulitis.   COLONOSCOPY     FOOT SURGERY Left    g3p2 c-section1     KNEE SURGERY Right    PORTACATH PLACEMENT Right 09/24/2018   Procedure: INSERTION PORT-A-CATH WITH ULTRASOUND;  Surgeon: [name] Luna, MD;  Location: [facility];  Service: General;  Laterality: Right;   TONSILLECTOMY     TUBAL LIGATION      Family History  Problem Relation Age of Onset   Hypertension Mother    Hypertension Father    Heart disease Father    Diabetes Neg Hx    COPD Neg Hx     Social History   Socioeconomic History   Marital status: Divorced    Spouse name: Not on file   Number of children: 3   Years of education: 14    Highest education level: Not on file  Occupational History    Employer: NOT EMPLOYED  Social Designer, fashion/clothing strain: Not on file   Food insecurity:    Worry: Not on file    Inability: Not on file   Transportation needs:    Medical: Not on file    Non-medical: Not on file  Tobacco Use   Smoking status: Never Smoker   Smokeless tobacco: Never Used  Substance and Sexual Activity   Alcohol use: No    Alcohol/week: 0.0 standard drinks   Drug use: No   Sexual activity: Never    Birth control/protection: Post-menopausal    Comment: BTL  Lifestyle   Physical activity:    Days per week: Not on file    Minutes per session: Not on file   Stress: Not on file  Relationships   Social connections:    Talks on phone: Not on file  Gets together: Not on file    Attends religious service: Not on file    Active member of club or organization: Not on file    Attends meetings of clubs or organizations: Not on file    Relationship status: Not on file   Intimate partner violence:    Fear of current or ex partner: Not on file    Emotionally abused: Not on file    Physically abused: Not on file    Forced sexual activity: Not on file  Other Topics Concern   Not on file  Social History Narrative   HSG, [employer] - Human service/sociology. Married '2000 - 39yrs/seperated. 2 boys - '82, '87, 1 dtr - '81.   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     Past Medical History, Surgical history, Social history, and Family history were reviewed and updated as appropriate.   Please see review of systems for further details on the patient's review from today.   Review of Systems:  Review of Systems  Constitutional: Negative for activity change, appetite change, chills, diaphoresis and fever.  HENT: Negative for trouble swallowing and voice change.   Respiratory: Negative for cough, chest tightness, shortness of breath and wheezing.   Cardiovascular: Negative for chest pain, palpitations  and leg swelling.  Gastrointestinal: Positive for abdominal distention, abdominal pain and constipation. Negative for diarrhea, nausea and vomiting.  Musculoskeletal: Positive for arthralgias and gait problem. Negative for back pain and myalgias.  Neurological: Negative for dizziness, light-headedness and headaches.    Objective:   Physical Exam:  BP 125/70 (BP Location: Left Arm, Patient Position: Sitting)    Pulse (!) 104 Comment: Liza RN was notified   Temp 98.3 F (36.8 C) (Oral)    Resp 18    Ht 5\' 6"  (1.676 m)    LMP 09/13/2015 Comment: celebate, BTL   SpO2 100%    BMI 41.05 kg/m  ECOG: 0  Physical Exam Constitutional:      General: She is not in acute distress.    Appearance: She is not diaphoretic.  HENT:     Head: Normocephalic and atraumatic.     Mouth/Throat:     Pharynx: No oropharyngeal exudate.  Neck:     Musculoskeletal: Normal range of motion and neck supple.  Cardiovascular:     Rate and Rhythm: Normal rate and regular rhythm.     Heart sounds: Normal heart sounds. No murmur. No friction rub. No gallop.   Pulmonary:     Effort: Pulmonary effort is normal. No respiratory distress.     Breath sounds: Normal breath sounds. No wheezing or rales.  Abdominal:     General: Bowel sounds are decreased. There is no distension.     Palpations: Abdomen is soft. There is no mass.     Tenderness: There is abdominal tenderness. There is no guarding or rebound.  Musculoskeletal:     Right hip: She exhibits tenderness. She exhibits normal range of motion, no swelling, no crepitus, no deformity and no laceration.       Legs:  Lymphadenopathy:     Cervical: No cervical adenopathy.  Skin:    General: Skin is warm and dry.     Findings: No erythema or rash.  Neurological:     Mental Status: She is alert.     Coordination: Coordination normal.  Psychiatric:        Behavior: Behavior normal.        Thought Content: Thought content normal.  Judgment: Judgment normal.       [name] Review:     Component Value Date/Time   NA 141 12/27/2018 1356   K 3.7 12/27/2018 1356   CL 104 12/27/2018 1356   CO2 27 12/27/2018 1356   GLUCOSE 93 12/27/2018 1356   BUN 12 12/27/2018 1356   CREATININE 0.85 12/27/2018 1356   CALCIUM 9.3 12/27/2018 1356   PROT 7.4 12/27/2018 1356   ALBUMIN 4.0 12/27/2018 1356   AST 37 12/27/2018 1356   ALT 35 12/27/2018 1356   ALKPHOS 86 12/27/2018 1356   BILITOT 0.6 12/27/2018 1356   GFRNONAA >60 12/27/2018 1356   GFRAA >60 12/27/2018 1356       Component Value Date/Time   WBC 2.8 (L) 12/27/2018 1356   WBC 4.9 08/16/2018 0945   RBC 3.16 (L) 12/27/2018 1356   HGB 10.1 (L) 12/27/2018 1356   HCT 30.8 (L) 12/27/2018 1356   PLT 167 12/27/2018 1356   MCV 97.5 12/27/2018 1356   MCH 32.0 12/27/2018 1356   MCHC 32.8 12/27/2018 1356   RDW 15.6 (H) 12/27/2018 1356   LYMPHSABS 0.7 12/27/2018 1356   MONOABS 0.2 12/27/2018 1356   EOSABS 0.0 12/27/2018 1356   BASOSABS 0.0 12/27/2018 1356   -------------------------------  Imaging from last 24 hours (if applicable):  Radiology interpretation: Dg Abd 2 Views  Result Date: 12/27/2018 CLINICAL DATA:  Generalized abdominal pain and distention for 1 day EXAM: ABDOMEN - 2 VIEW COMPARISON:  09/30/2015 CT abdomen/pelvis FINDINGS: No dilated small bowel loops or air-fluid levels. Mild-to-moderate colonic stool volume. No evidence of pneumatosis or pneumoperitoneum. No radiopaque nephrolithiasis. Clear lung bases. Degenerative changes in the lumbar spine. IMPRESSION: Nonobstructive bowel gas pattern.  Mild-to-moderate colonic stool. Electronically Signed   By: Ilona Sorrel M.D.   On: 12/27/2018 14:05   Vas [name] Lower Extremity Venous (dvt)  Result Date: 12/27/2018  Lower Venous Study Indications: Swelling.  Performing Technologist: Abram Sander RVS  Examination Guidelines: A complete evaluation includes B-mode imaging, spectral Doppler, color Doppler, and power Doppler as needed of all accessible  portions of each vessel. Bilateral testing is considered an integral part of a complete examination. Limited examinations for reoccurring indications may be performed as noted.  +---------+---------------+---------+-----------+----------+--------------+  RIGHT     Compressibility Phasicity Spontaneity Properties Summary         +---------+---------------+---------+-----------+----------+--------------+  CFV       Full            Yes       Yes                                    +---------+---------------+---------+-----------+----------+--------------+  SFJ       Full                                                             +---------+---------------+---------+-----------+----------+--------------+  FV Prox   Full                                                             +---------+---------------+---------+-----------+----------+--------------+  FV Mid    Full                                                             +---------+---------------+---------+-----------+----------+--------------+  FV Distal Full                                                             +---------+---------------+---------+-----------+----------+--------------+  PFV       Full                                                             +---------+---------------+---------+-----------+----------+--------------+  POP       Full            Yes       Yes                                    +---------+---------------+---------+-----------+----------+--------------+  PTV       Full                                                             +---------+---------------+---------+-----------+----------+--------------+  PERO                                                       Not visualized  +---------+---------------+---------+-----------+----------+--------------+   +----+---------------+---------+-----------+----------+-------+  LEFT Compressibility Phasicity Spontaneity Properties Summary   +----+---------------+---------+-----------+----------+-------+  CFV  Full            Yes       Yes                             +----+---------------+---------+-----------+----------+-------+     Summary: Right: There is no evidence of deep vein thrombosis in the lower extremity. No cystic structure found in the popliteal fossa. Left: No evidence of common femoral vein obstruction.  *See table(s) above for measurements and observations.    Preliminary

## 2018-12-27 NOTE — Telephone Encounter (Signed)
Pt called and LVM.  Attempt x1 to return call.  No answer and voice mailbox not set up.

## 2018-12-27 NOTE — Patient Instructions (Signed)
Constipation Management  Magnesium Citrate, drink 1/2 bottle, drink remainder if no bowel movement with 30 to 60 minutes  Or  30 mg (1 tablespoon) of Milk of Magnesia in 8 ounces of prune juice, warm in microwave for 20 seconds    Begin the following after you have had a bowel movement:  Senna-S, 1 to 2 tablets twice daily  MiraLAX 17 grams in 8 ounces of liquids 1 to 2 times daily as needed   Remember to remain well hydrated. Drink, Drink, Drink non-caffeinated beverages.   Adjust these medications based on your response. If your bowel movements become too loose then decrease the amount of Senna-S and/or MiraLAX that you are using. If your bowel movements become too firm or are difficult to pass, the increase the amount of Senna-S and/or MiraLAX that you are using and increase your intake of water.  

## 2018-12-27 NOTE — Progress Notes (Signed)
Pt presents for R pelvic pain radiating into groin that started yesterday morning.  Denies recent injury.  Denies N/V/D or changes in bowel/urination.  Afebrile.  Pt reports swelling mostly yesterday that has subsided in R leg.  No edema found in R leg at time of appt.  Full sensation & strength in R leg, +2 pulses, limited ROM d/t pain.

## 2019-01-01 ENCOUNTER — Inpatient Hospital Stay: Payer: BC Managed Care – PPO

## 2019-01-01 ENCOUNTER — Other Ambulatory Visit: Payer: Self-pay

## 2019-01-01 VITALS — BP 109/63 | HR 106 | Temp 98.4°F | Resp 16 | Wt 255.8 lb

## 2019-01-01 DIAGNOSIS — C50311 Malignant neoplasm of lower-inner quadrant of right female breast: Secondary | ICD-10-CM

## 2019-01-01 DIAGNOSIS — Z170 Estrogen receptor positive status [ER+]: Secondary | ICD-10-CM

## 2019-01-01 DIAGNOSIS — Z95828 Presence of other vascular implants and grafts: Secondary | ICD-10-CM

## 2019-01-01 LAB — CMP (CANCER CENTER ONLY)
ALT: 34 U/L (ref 0–44)
AST: 24 U/L (ref 15–41)
Albumin: 4 g/dL (ref 3.5–5.0)
Alkaline Phosphatase: 72 U/L (ref 38–126)
Anion gap: 11 (ref 5–15)
BUN: 18 mg/dL (ref 6–20)
CO2: 25 mmol/L (ref 22–32)
Calcium: 9.4 mg/dL (ref 8.9–10.3)
Chloride: 104 mmol/L (ref 98–111)
Creatinine: 1.19 mg/dL — ABNORMAL HIGH (ref 0.44–1.00)
GFR, Est AFR Am: 60 mL/min — ABNORMAL LOW (ref >60)
GFR, Estimated: 51 mL/min — ABNORMAL LOW (ref >60)
Glucose, Bld: 104 mg/dL — ABNORMAL HIGH (ref 70–99)
Potassium: 3.3 mmol/L — ABNORMAL LOW (ref 3.5–5.1)
Sodium: 140 mmol/L (ref 135–145)
Total Bilirubin: 0.3 mg/dL (ref 0.3–1.2)
Total Protein: 7.3 g/dL (ref 6.5–8.1)

## 2019-01-01 LAB — CBC WITH DIFFERENTIAL (CANCER CENTER ONLY)
Abs Immature Granulocytes: 0.05 10*3/uL (ref 0.00–0.07)
Basophils Absolute: 0 10*3/uL (ref 0.0–0.1)
Basophils Relative: 0 %
Eosinophils Absolute: 0 10*3/uL (ref 0.0–0.5)
Eosinophils Relative: 0 %
HCT: 30.9 % — ABNORMAL LOW (ref 36.0–46.0)
Hemoglobin: 10 g/dL — ABNORMAL LOW (ref 12.0–15.0)
Immature Granulocytes: 1 %
Lymphocytes Relative: 20 %
Lymphs Abs: 1 10*3/uL (ref 0.7–4.0)
MCH: 31.9 pg (ref 26.0–34.0)
MCHC: 32.4 g/dL (ref 30.0–36.0)
MCV: 98.7 fL (ref 80.0–100.0)
Monocytes Absolute: 0.3 10*3/uL (ref 0.1–1.0)
Monocytes Relative: 7 %
Neutro Abs: 3.5 10*3/uL (ref 1.7–7.7)
Neutrophils Relative %: 72 %
Platelet Count: 188 10*3/uL (ref 150–400)
RBC: 3.13 MIL/uL — ABNORMAL LOW (ref 3.87–5.11)
RDW: 15.9 % — ABNORMAL HIGH (ref 11.5–15.5)
WBC Count: 4.9 10*3/uL (ref 4.0–10.5)
nRBC: 1.6 % — ABNORMAL HIGH (ref 0.0–0.2)

## 2019-01-01 MED ORDER — HEPARIN SOD (PORK) LOCK FLUSH 100 UNIT/ML IV SOLN
500.0000 [IU] | Freq: Once | INTRAVENOUS | Status: AC | PRN
  Administered 2019-01-01: 500 [IU]
  Filled 2019-01-01: qty 5

## 2019-01-01 MED ORDER — DIPHENHYDRAMINE HCL 50 MG/ML IJ SOLN
INTRAMUSCULAR | Status: AC
  Filled 2019-01-01: qty 1

## 2019-01-01 MED ORDER — POTASSIUM CHLORIDE CRYS ER 20 MEQ PO TBCR
EXTENDED_RELEASE_TABLET | ORAL | Status: AC
  Filled 2019-01-01: qty 1

## 2019-01-01 MED ORDER — SODIUM CHLORIDE 0.9% FLUSH
10.0000 mL | Freq: Once | INTRAVENOUS | Status: AC
  Administered 2019-01-01: 10 mL
  Filled 2019-01-01: qty 10

## 2019-01-01 MED ORDER — SODIUM CHLORIDE 0.9% FLUSH
10.0000 mL | INTRAVENOUS | Status: DC | PRN
  Administered 2019-01-01: 10 mL
  Filled 2019-01-01: qty 10

## 2019-01-01 MED ORDER — POTASSIUM CHLORIDE CRYS ER 20 MEQ PO TBCR
20.0000 meq | EXTENDED_RELEASE_TABLET | Freq: Once | ORAL | Status: AC
  Administered 2019-01-01: 20 meq via ORAL

## 2019-01-01 MED ORDER — DIPHENHYDRAMINE HCL 50 MG/ML IJ SOLN
50.0000 mg | Freq: Once | INTRAMUSCULAR | Status: AC
  Administered 2019-01-01: 09:00:00 50 mg via INTRAVENOUS

## 2019-01-01 MED ORDER — SODIUM CHLORIDE 0.9 % IV SOLN
60.0000 mg/m2 | Freq: Once | INTRAVENOUS | Status: AC
  Administered 2019-01-01: 138 mg via INTRAVENOUS
  Filled 2019-01-01: qty 23

## 2019-01-01 MED ORDER — SODIUM CHLORIDE 0.9 % IV SOLN
Freq: Once | INTRAVENOUS | Status: AC
  Administered 2019-01-01: 09:00:00 via INTRAVENOUS
  Filled 2019-01-01: qty 250

## 2019-01-01 NOTE — Patient Instructions (Signed)
[facility] Discharge Instructions for Patients Receiving Chemotherapy  Today you received the following chemotherapy agents Paclitaxel (TAXOL).  To help prevent nausea and vomiting after your treatment, we encourage you to take your nausea medication as prescribed.  If you develop nausea and vomiting that is not controlled by your nausea medication, call the clinic.   BELOW ARE SYMPTOMS THAT SHOULD BE REPORTED IMMEDIATELY:  *FEVER GREATER THAN 100.5 F  *CHILLS WITH OR WITHOUT FEVER  NAUSEA AND VOMITING THAT IS NOT CONTROLLED WITH YOUR NAUSEA MEDICATION  *UNUSUAL SHORTNESS OF BREATH  *UNUSUAL BRUISING OR BLEEDING  TENDERNESS IN MOUTH AND THROAT WITH OR WITHOUT PRESENCE OF ULCERS  *URINARY PROBLEMS  *BOWEL PROBLEMS  UNUSUAL RASH Items with * indicate a potential emergency and should be followed up as soon as possible.  Feel free to call the clinic should you have any questions or concerns. The clinic phone number is [phone].  Please show the CHEMO ALERT CARD at check-in to the Emergency Department and triage nurse.  Coronavirus (COVID-19) Are you at risk?  Are you at risk for the Coronavirus (COVID-19)?  To be considered HIGH RISK for Coronavirus (COVID-19), you have to meet the following criteria:  . Traveled to [place], [place], [name], [place] or [place]; or in the [place] to [place], [place], [place], or [place]; and have fever, cough, and shortness of breath within the last 2 weeks of travel OR . Been in close contact with a person diagnosed with COVID-19 within the last 2 weeks and have fever, cough, and shortness of breath . IF YOU DO NOT MEET THESE CRITERIA, YOU ARE CONSIDERED LOW RISK FOR COVID-19.  What to do if you are HIGH RISK for COVID-19?  . If you are having a medical emergency, call 911. . Seek medical care right away. Before you go to a doctor's office, urgent care or emergency department, call ahead and tell them about  your recent travel, contact with someone diagnosed with COVID-19, and your symptoms. You should receive instructions from your physician's office regarding next steps of care.  . When you arrive at healthcare provider, tell the healthcare staff immediately you have returned from visiting [place], [place], [place], [place] or [name]; or traveled in the [place] to [place], [place], [place], or [place]; in the last two weeks or you have been in close contact with a person diagnosed with COVID-19 in the last 2 weeks.   . Tell the health care staff about your symptoms: fever, cough and shortness of breath. . After you have been seen by a medical provider, you will be either: o Tested for (COVID-19) and discharged home on quarantine except to seek medical care if symptoms worsen, and asked to  - Stay home and avoid contact with others until you get your results (4-5 days)  - Avoid travel on public transportation if possible (such as bus, train, or airplane) or o Sent to the Emergency Department by EMS for evaluation, COVID-19 testing, and possible admission depending on your condition and test results.  What to do if you are LOW RISK for COVID-19?  Reduce your risk of any infection by using the same precautions used for avoiding the common cold or flu:  . Wash your hands often with soap and warm water for at least 20 seconds.  If soap and water are not readily available, use an alcohol-based hand sanitizer with at least 60% alcohol.  . If coughing or sneezing,   cover your mouth and nose by coughing or sneezing into the elbow areas of your shirt or coat, into a tissue or into your sleeve (not your hands). . Avoid shaking hands with others and consider head nods or verbal greetings only. . Avoid touching your eyes, nose, or mouth with unwashed hands.  . Avoid close contact with people who are sick. . Avoid places or events with large numbers of people in one location, like concerts or sporting  events. . Carefully consider travel plans you have or are making. . If you are planning any travel outside or inside the [place], visit the CDC's Travelers' Health webpage for the latest health notices. . If you have some symptoms but not all symptoms, continue to monitor at home and seek medical attention if your symptoms worsen. . If you are having a medical emergency, call 911.   ADDITIONAL HEALTHCARE OPTIONS FOR PATIENTS  [facility] / e-Visit: [email or web address]         [facility]: [phone]  [facility]: [phone]                   [facility]: [phone]     

## 2019-01-01 NOTE — Progress Notes (Signed)
Ok to treat with elevated HR per Dr.Gudena.  

## 2019-01-02 ENCOUNTER — Encounter: Payer: Self-pay | Admitting: General Practice

## 2019-01-02 NOTE — Assessment & Plan Note (Signed)
08/23/2018:Right lumpectomy: IDC, 0.7 cm, grade 3, margins negative, 0/3 lymph nodes negative, ER 40%, PR 0%, HER-2 negative, Ki-67 50%, T1BN0 stage Ia  Oncotype Dx 44: High Risk  Treatment plan: 1.Adj Chemo with Dose dense adriamycin and Cytoxan foll by Taxol weekly X 12 2. Adjuvant radiation therapy followed by 3. Adjuvant antiestrogen therapy ------------------------------------------------------------------------------------------------------------ Current Treatment:Completed 4 cyclesdose dense Adriamycin and Cytoxan, today cycle8of Taxol  Taxol toxicities: 1.Fatigue: Profound fatigue 2.Mild thrombocytopenia: Platelet count improved remarkably. 3.Chemotherapy-induced anemia: Today's hemoglobin is81moitoring closely. 4.Lower back pain after chemo.Appears to be slightly better 5.Chemo-induced peripheral neuropathy: We will decrease the dosage of Taxol today. I will also discontinue Pepcid because it is causing her stomach upset. 6.  Abdominal pain: Constipation related 7.  Hip joint discomfort: Given Bactrim recently  We will have to monitor her symptoms.  Patientisicing her fingers and toes to prevent neuropathy.  RTCweekly for Taxol and every other week for follow-up with me.

## 2019-01-02 NOTE — Progress Notes (Signed)
[facility] CSW Progress Notes  Call to patient to follow up on [employer] application.  Per funder, patient needs to submit mortgage bill w address of payee, front/back copies of last two months checking and savings accounts.  Pt received same email - CSW left VM w patient asking if she needed CSW help in supplying these documents to funding source.  [facility] may be able to provide limited financial assistance to patient.  Edwyna Shell, LCSW Clinical Social Worker Phone:  [phone]

## 2019-01-07 ENCOUNTER — Encounter: Payer: Self-pay | Admitting: General Practice

## 2019-01-07 NOTE — Progress Notes (Signed)
Patient Care Team: [name], [name], MD as PCP - General (Family Medicine) Erroll Luna, MD as Consulting Physician (General Surgery) Nicholas Lose, MD as Consulting Physician (Hematology and Oncology) Kyung Rudd, MD as Consulting Physician (Radiation Oncology)  DIAGNOSIS:    ICD-10-CM   1. Malignant neoplasm of lower-inner quadrant of right breast of female, estrogen receptor positive ([place]) C50.311    Z17.0     SUMMARY OF ONCOLOGIC HISTORY:   Malignant neoplasm of lower-inner quadrant of right breast of female, estrogen receptor positive ([place])   07/16/2018 Initial Diagnosis    Pain right breast UOQ: Negative on mammogram, incidentally found right breast mass LIQ at 3:30 position measuring 0.6 cm biopsy revealed grade 3 IDC ER 40%, PR 0%, HER-2 negative, Ki-67 50%, T1bN0 stage Ib    07/24/2018 Cancer Staging    Staging form: Breast, AJCC 8th Edition - Clinical: Stage IB (cT1b, cN0, cM0, G3, ER+, PR-, HER2-) - Signed by Nicholas Lose, MD on 07/24/2018    08/23/2018 Surgery    Right lumpectomy: IDC, 0.7 cm, grade 3, margins negative, 0/3 lymph nodes negative, ER 40%, PR 0%, HER-2 negative, Ki-67 50%, T1BN0 stage Ia    09/04/2018 Cancer Staging    Staging form: Breast, AJCC 8th Edition - Pathologic: Stage IA (pT1b, pN0, cM0, G3, ER+, PR-, HER2-) - Signed by Gardenia Phlegm, NP on 09/04/2018    09/25/2018 -  Chemotherapy    Adjuvant chemotherapy with dose dense Adriamycin and Cytoxan x4 followed by Taxol weekly x12.       CHIEF COMPLIANT: Cycle 8 Taxol  INTERVAL HISTORY: [name] [name] is a 53 y.o. with above-mentioned history of right breast cancer treated with lumpectomywhocompleted 4 cycles of neoadjuvant dose dense Adriamycin and Cytoxan and is currently receiving weekly Taxol treatments.On 12/27/18 she presented to the symptom management clinic complaining of swollen abdomen and right leg. Abdominal XR was negative and constipation was managed. Vascular US of  RLE was negative for DVT. She was given 7 day course of Bactrim, prednisone taper, and Vicodin for abscess of the buttock. She presents to the clinicalonetodayfor cycle8.  REVIEW OF SYSTEMS:   Constitutional: Denies fevers, chills or abnormal weight loss Eyes: Denies blurriness of vision Ears, nose, mouth, throat, and face: Denies mucositis or sore throat Respiratory: Denies cough, dyspnea or wheezes Cardiovascular: Denies palpitation, chest discomfort Gastrointestinal: Denies nausea, heartburn or change in bowel habits Skin: Denies abnormal skin rashes Lymphatics: Denies new lymphadenopathy or easy bruising Neurological: Denies numbness, tingling or new weaknesses Behavioral/Psych: Mood is stable, no new changes  Extremities: No lower extremity edema Breast: denies any pain or lumps or nodules in either breasts All other systems were reviewed with the patient and are negative.  I have reviewed the past medical history, past surgical history, social history and family history with the patient and they are unchanged from previous note.  ALLERGIES:  is allergic to ibuprofen; lisinopril; and dilaudid [hydromorphone hcl].  MEDICATIONS:  Current Outpatient Medications  Medication Sig Dispense Refill  . Cholecalciferol (VITAMIN D3) 5000 UNITS TABS Take 1 tablet by mouth daily.    . clobetasol ointment (TEMOVATE) 2.95 % Apply 1 application topically 2 (two) times daily. Then as needed for 30 days  1  . Flaxseed, Linseed, (FLAX SEED OIL PO) Take 5 mLs by mouth every morning.    . gabapentin (NEURONTIN) 300 MG capsule Take 1 capsule (300 mg total) by mouth 2 (two) times daily. 20 capsule 1  . HYDROcodone-acetaminophen (NORCO/VICODIN) 5-325 MG tablet Take  1 tablet by mouth every 4 (four) hours as needed for moderate pain. 30 tablet 0  . lidocaine (XYLOCAINE) 2 % solution Use as directed 5 mLs in the mouth or throat every 3 (three) hours as needed for mouth pain (swish and swallow of spit). 200  mL 1  . lidocaine-prilocaine (EMLA) cream Apply to affected area once 30 g 3  . LORazepam (ATIVAN) 0.5 MG tablet Take 1 tablet (0.5 mg total) by mouth at bedtime as needed for sleep. 30 tablet 0  . naproxen sodium (ALEVE) 220 MG tablet Take 660 mg by mouth daily as needed (pain).    . nystatin (MYCOSTATIN) 100000 UNIT/ML suspension     . Olmesartan-amLODIPine-HCTZ 40-10-25 MG TABS Take 1 tablet by mouth daily.    Marland Kitchen omeprazole (PRILOSEC) 40 MG capsule Take 1 capsule (40 mg total) by mouth daily. 30 capsule 0  . ondansetron (ZOFRAN) 8 MG tablet Take 1 tablet (8 mg total) by mouth 2 (two) times daily as needed. Start on the third day after chemotherapy. 30 tablet 1  . predniSONE (DELTASONE) 5 MG tablet 6 tab x 1 day, 5 tab x 1 day, 4 tab x 1 day, 3 tab x 1 day, 2 tab x 1 day, 1 tab x 1 day, stop 21 tablet 0  . prochlorperazine (COMPAZINE) 10 MG tablet Take 1 tablet (10 mg total) by mouth every 6 (six) hours as needed (Nausea or vomiting). 30 tablet 1  . promethazine (PHENERGAN) 25 MG tablet Take 1 tablet (25 mg total) by mouth every 6 (six) hours as needed for nausea. 30 tablet 3  . sulfamethoxazole-trimethoprim (BACTRIM DS) 800-160 MG tablet Take 1 tablet by mouth 2 (two) times daily. 14 tablet 0  . tetrahydrozoline-zinc (VISINE-AC) 0.05-0.25 % ophthalmic solution Place 2 drops into both eyes as needed (dry, itchy eyes).     No current facility-administered medications for this visit.     PHYSICAL EXAMINATION: ECOG PERFORMANCE STATUS: 1 - Symptomatic but completely ambulatory  There were no vitals filed for this visit. There were no vitals filed for this visit.  GENERAL: alert, no distress and comfortable SKIN: skin color, texture, turgor are normal, no rashes or significant lesions EYES: normal, Conjunctiva are pink and non-injected, sclera clear OROPHARYNX: no exudate, no erythema and lips, buccal mucosa, and tongue normal  NECK: supple, thyroid normal size, non-tender, without nodularity  LYMPH: no palpable lymphadenopathy in the cervical, axillary or inguinal LUNGS: clear to auscultation and percussion with normal breathing effort HEART: regular rate & rhythm and no murmurs and no lower extremity edema ABDOMEN: abdomen soft, non-tender and normal bowel sounds MUSCULOSKELETAL: no cyanosis of digits and no clubbing  NEURO: alert & oriented x 3 with fluent speech, no focal motor/sensory deficits EXTREMITIES: No lower extremity edema  LABORATORY DATA:  I have reviewed the data as listed CMP Latest Ref Rng & Units 01/01/2019 12/27/2018 12/25/2018  Glucose 70 - 99 mg/dL 104(H) 93 89  BUN 6 - 20 mg/dL '18 12 9  ' Creatinine 0.44 - 1.00 mg/dL 1.19(H) 0.85 0.82  Sodium 135 - 145 mmol/L 140 141 140  Potassium 3.5 - 5.1 mmol/L 3.3(L) 3.7 3.5  Chloride 98 - 111 mmol/L 104 104 105  CO2 22 - 32 mmol/L '25 27 26  ' Calcium 8.9 - 10.3 mg/dL 9.4 9.3 9.2  Total Protein 6.5 - 8.1 g/dL 7.3 7.4 7.1  Total Bilirubin 0.3 - 1.2 mg/dL 0.3 0.6 0.4  Alkaline Phos 38 - 126 U/L 72 86 78  AST 15 -  41 U/L 24 37 27  ALT 0 - 44 U/L 34 35 31    Lab Results  Component Value Date   WBC 4.0 01/08/2019   HGB 10.1 (L) 01/08/2019   HCT 30.4 (L) 01/08/2019   MCV 98.7 01/08/2019   PLT 164 01/08/2019   NEUTROABS 2.8 01/08/2019    ASSESSMENT & PLAN:  Malignant neoplasm of lower-inner quadrant of right breast of female, estrogen receptor positive ([facility]) 08/23/2018:Right lumpectomy: IDC, 0.7 cm, grade 3, margins negative, 0/3 lymph nodes negative, ER 40%, PR 0%, HER-2 negative, Ki-67 50%, T1BN0 stage Ia  Oncotype Dx 44: High Risk  Treatment plan: 1.Adj Chemo with Dose dense adriamycin and Cytoxan foll by Taxol weekly X 12 2. Adjuvant radiation therapy followed by 3. Adjuvant antiestrogen therapy ------------------------------------------------------------------------------------------------------------ Current Treatment:Completed 4 cyclesdose dense Adriamycin and Cytoxan, today cycle8of Taxol  Taxol  toxicities: 1.Fatigue: Profound fatigue 2.Mild thrombocytopenia: Platelet count improved remarkably. 3.Chemotherapy-induced anemia: Today's hemoglobin is10.72moitoring closely. 4.Lower back pain after chemo.Appears to be slightly better 5.Chemo-induced peripheral neuropathy: We decreased the dosage of Taxol.  Neuropathy doing much better because she is also soaking her hands in Epsom salt.  We will have to monitor her symptoms.  Patientisicing her fingers and toes to prevent neuropathy.  RTCweekly for Taxol and every other week for follow-up with me.  No orders of the defined types were placed in this encounter.  The patient has a good understanding of the overall plan. she agrees with it. she will call with any problems that may develop before the next visit here.  GNicholas Lose MD 01/08/2019  IJulious OkaDorshimer am acting as scribe for Dr. VNicholas Lose  I have reviewed the above documentation for accuracy and completeness, and I agree with the above.

## 2019-01-07 NOTE — Progress Notes (Signed)
[name] CSW Progress Notes  Call and email from patient - she has received notification from [facility] that her application is incomplete.  She needs to provide copies of her checking and savings account information.  She also needs to provide a statement re her mortgage which would include amount due, her name, address and where [employer] would mail payments.  Pt received same email so she is aware of what [employer] requires.  Left VM for patient - she may drop these items at [facility] and [place] will scan/email to [facility] or she can provide these directly to [employer] herself.  Left VM for patient w this information.  Edwyna Shell, LCSW Clinical Social Worker Phone:  [phone]

## 2019-01-08 ENCOUNTER — Inpatient Hospital Stay: Payer: BC Managed Care – PPO

## 2019-01-08 ENCOUNTER — Other Ambulatory Visit: Payer: Self-pay

## 2019-01-08 ENCOUNTER — Inpatient Hospital Stay (HOSPITAL_BASED_OUTPATIENT_CLINIC_OR_DEPARTMENT_OTHER): Payer: BC Managed Care – PPO | Admitting: Hematology and Oncology

## 2019-01-08 DIAGNOSIS — C50311 Malignant neoplasm of lower-inner quadrant of right female breast: Secondary | ICD-10-CM

## 2019-01-08 DIAGNOSIS — Z170 Estrogen receptor positive status [ER+]: Secondary | ICD-10-CM

## 2019-01-08 DIAGNOSIS — D696 Thrombocytopenia, unspecified: Secondary | ICD-10-CM | POA: Diagnosis not present

## 2019-01-08 DIAGNOSIS — D6481 Anemia due to antineoplastic chemotherapy: Secondary | ICD-10-CM

## 2019-01-08 DIAGNOSIS — G620 Drug-induced polyneuropathy: Secondary | ICD-10-CM

## 2019-01-08 DIAGNOSIS — Z95828 Presence of other vascular implants and grafts: Secondary | ICD-10-CM

## 2019-01-08 DIAGNOSIS — Z79899 Other long term (current) drug therapy: Secondary | ICD-10-CM

## 2019-01-08 DIAGNOSIS — T451X5A Adverse effect of antineoplastic and immunosuppressive drugs, initial encounter: Secondary | ICD-10-CM

## 2019-01-08 LAB — CMP (CANCER CENTER ONLY)
ALT: 18 U/L (ref 0–44)
AST: 19 U/L (ref 15–41)
Albumin: 3.9 g/dL (ref 3.5–5.0)
Alkaline Phosphatase: 69 U/L (ref 38–126)
Anion gap: 11 (ref 5–15)
BUN: 12 mg/dL (ref 6–20)
CO2: 26 mmol/L (ref 22–32)
Calcium: 9.2 mg/dL (ref 8.9–10.3)
Chloride: 103 mmol/L (ref 98–111)
Creatinine: 0.85 mg/dL (ref 0.44–1.00)
GFR, Est AFR Am: >60 mL/min (ref >60)
GFR, Estimated: >60 mL/min (ref >60)
Glucose, Bld: 95 mg/dL (ref 70–99)
Potassium: 3.7 mmol/L (ref 3.5–5.1)
Sodium: 140 mmol/L (ref 135–145)
Total Bilirubin: 0.4 mg/dL (ref 0.3–1.2)
Total Protein: 7 g/dL (ref 6.5–8.1)

## 2019-01-08 LAB — CBC WITH DIFFERENTIAL (CANCER CENTER ONLY)
Abs Immature Granulocytes: 0.03 10*3/uL (ref 0.00–0.07)
Basophils Absolute: 0 10*3/uL (ref 0.0–0.1)
Basophils Relative: 1 %
Eosinophils Absolute: 0.1 10*3/uL (ref 0.0–0.5)
Eosinophils Relative: 2 %
HCT: 30.4 % — ABNORMAL LOW (ref 36.0–46.0)
Hemoglobin: 10.1 g/dL — ABNORMAL LOW (ref 12.0–15.0)
Immature Granulocytes: 1 %
Lymphocytes Relative: 19 %
Lymphs Abs: 0.7 10*3/uL (ref 0.7–4.0)
MCH: 32.8 pg (ref 26.0–34.0)
MCHC: 33.2 g/dL (ref 30.0–36.0)
MCV: 98.7 fL (ref 80.0–100.0)
Monocytes Absolute: 0.3 10*3/uL (ref 0.1–1.0)
Monocytes Relative: 8 %
Neutro Abs: 2.8 10*3/uL (ref 1.7–7.7)
Neutrophils Relative %: 69 %
Platelet Count: 164 10*3/uL (ref 150–400)
RBC: 3.08 MIL/uL — ABNORMAL LOW (ref 3.87–5.11)
RDW: 15.3 % (ref 11.5–15.5)
WBC Count: 4 10*3/uL (ref 4.0–10.5)
nRBC: 0.8 % — ABNORMAL HIGH (ref 0.0–0.2)

## 2019-01-08 MED ORDER — SODIUM CHLORIDE 0.9 % IV SOLN
60.0000 mg/m2 | Freq: Once | INTRAVENOUS | Status: AC
  Administered 2019-01-08: 138 mg via INTRAVENOUS
  Filled 2019-01-08: qty 23

## 2019-01-08 MED ORDER — DIPHENHYDRAMINE HCL 50 MG/ML IJ SOLN
INTRAMUSCULAR | Status: AC
  Filled 2019-01-08: qty 1

## 2019-01-08 MED ORDER — HEPARIN SOD (PORK) LOCK FLUSH 100 UNIT/ML IV SOLN
500.0000 [IU] | Freq: Once | INTRAVENOUS | Status: AC | PRN
  Administered 2019-01-08: 11:00:00 500 [IU]
  Filled 2019-01-08: qty 5

## 2019-01-08 MED ORDER — DIPHENHYDRAMINE HCL 50 MG/ML IJ SOLN
50.0000 mg | Freq: Once | INTRAMUSCULAR | Status: AC
  Administered 2019-01-08: 50 mg via INTRAVENOUS

## 2019-01-08 MED ORDER — SODIUM CHLORIDE 0.9 % IV SOLN
Freq: Once | INTRAVENOUS | Status: AC
  Administered 2019-01-08: 09:00:00 via INTRAVENOUS
  Filled 2019-01-08: qty 250

## 2019-01-08 MED ORDER — SODIUM CHLORIDE 0.9% FLUSH
10.0000 mL | INTRAVENOUS | Status: DC | PRN
  Administered 2019-01-08: 11:00:00 10 mL
  Filled 2019-01-08: qty 10

## 2019-01-08 MED ORDER — SODIUM CHLORIDE 0.9% FLUSH
10.0000 mL | Freq: Once | INTRAVENOUS | Status: AC
  Administered 2019-01-08: 10 mL
  Filled 2019-01-08: qty 10

## 2019-01-08 NOTE — Patient Instructions (Signed)

## 2019-01-08 NOTE — Patient Instructions (Signed)
[name] Discharge Instructions for Patients Receiving Chemotherapy  Today you received the following chemotherapy agent: Paclitaxel (TAXOL)  To help prevent nausea and vomiting after your treatment, we encourage you to take your nausea medication as prescribed.   If you develop nausea and vomiting that is not controlled by your nausea medication, call the clinic.   BELOW ARE SYMPTOMS THAT SHOULD BE REPORTED IMMEDIATELY:  *FEVER GREATER THAN 100.5 F  *CHILLS WITH OR WITHOUT FEVER  NAUSEA AND VOMITING THAT IS NOT CONTROLLED WITH YOUR NAUSEA MEDICATION  *UNUSUAL SHORTNESS OF BREATH  *UNUSUAL BRUISING OR BLEEDING  TENDERNESS IN MOUTH AND THROAT WITH OR WITHOUT PRESENCE OF ULCERS  *URINARY PROBLEMS  *BOWEL PROBLEMS  UNUSUAL RASH Items with * indicate a potential emergency and should be followed up as soon as possible.  Feel free to call the clinic should you have any questions or concerns. The clinic phone number is [phone].  Please show the [facility] at check-in to the Emergency Department and triage nurse.

## 2019-01-09 ENCOUNTER — Encounter: Payer: Self-pay | Admitting: General Practice

## 2019-01-09 NOTE — Progress Notes (Signed)
[facility] CSW Progress Notes  Patient approved for $2500 grant from [employer], will be put towards unpaid medical bills related to cancer treatments.  Edwyna Shell, LCSW Clinical Social Worker Phone:  [phone]

## 2019-01-10 ENCOUNTER — Encounter: Payer: Self-pay | Admitting: General Practice

## 2019-01-10 NOTE — Progress Notes (Signed)
[facility] CSW Progress Notes  Additional financial documents submitted to [employer] at patients request.  This should complete her application for assistance, awaiting decision from funding source.  Edwyna Shell, LCSW Clinical Social Worker Phone:  [phone]

## 2019-01-10 NOTE — Progress Notes (Signed)
[facility] CSW Progress Note  Patient advised that she can submit unpaid medical bills directly to [employer] [facility] [place] for reimbursement up to limit of grant she was awarded.  Edwyna Shell, LCSW Clinical Social Worker Phone:  [phone]

## 2019-01-15 ENCOUNTER — Other Ambulatory Visit: Payer: Self-pay

## 2019-01-15 ENCOUNTER — Inpatient Hospital Stay: Payer: BC Managed Care – PPO | Admitting: General Practice

## 2019-01-15 ENCOUNTER — Other Ambulatory Visit: Payer: Self-pay | Admitting: Hematology and Oncology

## 2019-01-15 ENCOUNTER — Inpatient Hospital Stay: Payer: BC Managed Care – PPO

## 2019-01-15 VITALS — BP 115/72 | HR 92 | Temp 98.5°F | Resp 17

## 2019-01-15 DIAGNOSIS — C50311 Malignant neoplasm of lower-inner quadrant of right female breast: Secondary | ICD-10-CM | POA: Diagnosis not present

## 2019-01-15 DIAGNOSIS — Z170 Estrogen receptor positive status [ER+]: Secondary | ICD-10-CM

## 2019-01-15 DIAGNOSIS — Z95828 Presence of other vascular implants and grafts: Secondary | ICD-10-CM

## 2019-01-15 LAB — CMP (CANCER CENTER ONLY)
ALT: 29 U/L (ref 0–44)
AST: 30 U/L (ref 15–41)
Albumin: 3.9 g/dL (ref 3.5–5.0)
Alkaline Phosphatase: 69 U/L (ref 38–126)
Anion gap: 9 (ref 5–15)
BUN: 9 mg/dL (ref 6–20)
CO2: 28 mmol/L (ref 22–32)
Calcium: 9.2 mg/dL (ref 8.9–10.3)
Chloride: 102 mmol/L (ref 98–111)
Creatinine: 0.86 mg/dL (ref 0.44–1.00)
GFR, Est AFR Am: >60 mL/min (ref >60)
GFR, Estimated: >60 mL/min (ref >60)
Glucose, Bld: 99 mg/dL (ref 70–99)
Potassium: 3.4 mmol/L — ABNORMAL LOW (ref 3.5–5.1)
Sodium: 139 mmol/L (ref 135–145)
Total Bilirubin: 0.3 mg/dL (ref 0.3–1.2)
Total Protein: 7.1 g/dL (ref 6.5–8.1)

## 2019-01-15 LAB — CBC WITH DIFFERENTIAL (CANCER CENTER ONLY)
Abs Immature Granulocytes: 0.01 10*3/uL (ref 0.00–0.07)
Basophils Absolute: 0 10*3/uL (ref 0.0–0.1)
Basophils Relative: 0 %
Eosinophils Absolute: 0.1 10*3/uL (ref 0.0–0.5)
Eosinophils Relative: 2 %
HCT: 31 % — ABNORMAL LOW (ref 36.0–46.0)
Hemoglobin: 10.1 g/dL — ABNORMAL LOW (ref 12.0–15.0)
Immature Granulocytes: 0 %
Lymphocytes Relative: 26 %
Lymphs Abs: 0.7 10*3/uL (ref 0.7–4.0)
MCH: 32.2 pg (ref 26.0–34.0)
MCHC: 32.6 g/dL (ref 30.0–36.0)
MCV: 98.7 fL (ref 80.0–100.0)
Monocytes Absolute: 0.3 10*3/uL (ref 0.1–1.0)
Monocytes Relative: 9 %
Neutro Abs: 1.8 10*3/uL (ref 1.7–7.7)
Neutrophils Relative %: 63 %
Platelet Count: 168 10*3/uL (ref 150–400)
RBC: 3.14 MIL/uL — ABNORMAL LOW (ref 3.87–5.11)
RDW: 14.6 % (ref 11.5–15.5)
WBC Count: 2.9 10*3/uL — ABNORMAL LOW (ref 4.0–10.5)
nRBC: 1 % — ABNORMAL HIGH (ref 0.0–0.2)

## 2019-01-15 MED ORDER — HEPARIN SOD (PORK) LOCK FLUSH 100 UNIT/ML IV SOLN
500.0000 [IU] | Freq: Once | INTRAVENOUS | Status: AC | PRN
  Administered 2019-01-15: 500 [IU]
  Filled 2019-01-15: qty 5

## 2019-01-15 MED ORDER — SODIUM CHLORIDE 0.9% FLUSH
10.0000 mL | INTRAVENOUS | Status: DC | PRN
  Administered 2019-01-15: 12:00:00 10 mL
  Filled 2019-01-15: qty 10

## 2019-01-15 MED ORDER — DIPHENHYDRAMINE HCL 50 MG/ML IJ SOLN
50.0000 mg | Freq: Once | INTRAMUSCULAR | Status: AC
  Administered 2019-01-15: 10:00:00 50 mg via INTRAVENOUS

## 2019-01-15 MED ORDER — SODIUM CHLORIDE 0.9 % IV SOLN
45.0000 mg/m2 | Freq: Once | INTRAVENOUS | Status: AC
  Administered 2019-01-15: 11:00:00 102 mg via INTRAVENOUS
  Filled 2019-01-15: qty 17

## 2019-01-15 MED ORDER — DIPHENHYDRAMINE HCL 50 MG/ML IJ SOLN
INTRAMUSCULAR | Status: AC
  Filled 2019-01-15: qty 1

## 2019-01-15 MED ORDER — SODIUM CHLORIDE 0.9 % IV SOLN
Freq: Once | INTRAVENOUS | Status: AC
  Administered 2019-01-15: 10:00:00 via INTRAVENOUS
  Filled 2019-01-15: qty 250

## 2019-01-15 MED ORDER — SODIUM CHLORIDE 0.9% FLUSH
10.0000 mL | Freq: Once | INTRAVENOUS | Status: AC
  Administered 2019-01-15: 10 mL
  Filled 2019-01-15: qty 10

## 2019-01-15 MED ORDER — GABAPENTIN 300 MG PO CAPS: 300.0000 mg | ORAL_CAPSULE | Freq: Three times a day (TID) | ORAL | 0 refills | Status: DC

## 2019-01-15 NOTE — Patient Instructions (Signed)
[facility] Discharge Instructions for Patients Receiving Chemotherapy  Today you received the following chemotherapy agents:  Taxol.  To help prevent nausea and vomiting after your treatment, we encourage you to take your nausea medication as directed.   If you develop nausea and vomiting that is not controlled by your nausea medication, call the clinic.   BELOW ARE SYMPTOMS THAT SHOULD BE REPORTED IMMEDIATELY:  *FEVER GREATER THAN 100.5 F  *CHILLS WITH OR WITHOUT FEVER  NAUSEA AND VOMITING THAT IS NOT CONTROLLED WITH YOUR NAUSEA MEDICATION  *UNUSUAL SHORTNESS OF BREATH  *UNUSUAL BRUISING OR BLEEDING  TENDERNESS IN MOUTH AND THROAT WITH OR WITHOUT PRESENCE OF ULCERS  *URINARY PROBLEMS  *BOWEL PROBLEMS  UNUSUAL RASH Items with * indicate a potential emergency and should be followed up as soon as possible.  Feel free to call the clinic should you have any questions or concerns. The clinic phone number is [phone].  Please show the CHEMO ALERT CARD at check-in to the Emergency Department and triage nurse.   

## 2019-01-15 NOTE — Assessment & Plan Note (Signed)
08/23/2018:Right lumpectomy: IDC, 0.7 cm, grade 3, margins negative, 0/3 lymph nodes negative, ER 40%, PR 0%, HER-2 negative, Ki-67 50%, T1BN0 stage Ia  Oncotype Dx 44: High Risk  Treatment plan: 1.Adj Chemo with Dose dense adriamycin and Cytoxan foll by Taxol weekly X 12 2. Adjuvant radiation therapy followed by 3. Adjuvant antiestrogen therapy ------------------------------------------------------------------------------------------------------------ Current Treatment:Completed 4 cyclesdose dense Adriamycin and Cytoxan, today cycle10of Taxol  Taxol toxicities: 1.Fatigue: Profound fatigue 2.Mild thrombocytopenia: Platelet count improved remarkably. 3.Chemotherapy-induced anemia: Today's hemoglobin is10.84moitoring closely. 4.Lower back pain after chemo.Appears to be slightly better 5.Chemo-induced peripheral neuropathy: We decreased the dosage of Taxol.  Neuropathy doing much better because she is also soaking her hands in Epsom salt.  We will have to monitor her symptoms.  Patientisicing her fingers and toes to prevent neuropathy.  RTCweekly for Taxol and every other week for follow-up with me.

## 2019-01-15 NOTE — Progress Notes (Signed)
Patient stated that since last Taxol treatment she has had worsened knee and back pain in addition to numbness and pain of fingers and toes impacting her ADL's at times. Tylenol has not been effective and symptoms have not resolved like in the past. Dr. Lindi Adie made aware and stated that he will reduce Taxol dose for today and patient is to take gabapentin 3 tabs a day. Patient stated that she has not taken the gabapentin since Feb. Dr. Lindi Adie made aware and new script sent to pharmacy. Patient aware.

## 2019-01-15 NOTE — Progress Notes (Signed)
Peripheral neuropathy: We will reduce the dosage of Taxol. Increase gabapentin to 3 times a day.

## 2019-01-21 NOTE — Progress Notes (Signed)
Patient Care Team: [name], [name], MD as PCP - General (Family Medicine) Erroll Luna, MD as Consulting Physician (General Surgery) Nicholas Lose, MD as Consulting Physician (Hematology and Oncology) Kyung Rudd, MD as Consulting Physician (Radiation Oncology)  DIAGNOSIS:    ICD-10-CM   1. Malignant neoplasm of lower-inner quadrant of right breast of female, estrogen receptor positive ([place]) C50.311    Z17.0     SUMMARY OF ONCOLOGIC HISTORY:   Malignant neoplasm of lower-inner quadrant of right breast of female, estrogen receptor positive ([place])   07/16/2018 Initial Diagnosis    Pain right breast UOQ: Negative on mammogram, incidentally found right breast mass LIQ at 3:30 position measuring 0.6 cm biopsy revealed grade 3 IDC ER 40%, PR 0%, HER-2 negative, Ki-67 50%, T1bN0 stage Ib    07/24/2018 Cancer Staging    Staging form: Breast, AJCC 8th Edition - Clinical: Stage IB (cT1b, cN0, cM0, G3, ER+, PR-, HER2-) - Signed by Nicholas Lose, MD on 07/24/2018    08/23/2018 Surgery    Right lumpectomy: IDC, 0.7 cm, grade 3, margins negative, 0/3 lymph nodes negative, ER 40%, PR 0%, HER-2 negative, Ki-67 50%, T1BN0 stage Ia    09/04/2018 Cancer Staging    Staging form: Breast, AJCC 8th Edition - Pathologic: Stage IA (pT1b, pN0, cM0, G3, ER+, PR-, HER2-) - Signed by Gardenia Phlegm, NP on 09/04/2018    09/25/2018 -  Chemotherapy    Adjuvant chemotherapy with dose dense Adriamycin and Cytoxan x4 followed by Taxol weekly x12.       CHIEF COMPLIANT: Cycle 10 Taxol  INTERVAL HISTORY: [name] [name] is a 53 y.o. with above-mentioned history of right breast cancer treated with lumpectomywhocompleted 4 cycles of neoadjuvant dose dense Adriamycin and Cytoxan and is currently receiving weekly Taxol treatments.On 01/15/19 she had increased knee and back pain and neuropathy in fingers and toes. Her treatment dosage was reduced and she was started on gabapentin 3 times daily. She  presents to the clinic today for cycle 10.   REVIEW OF SYSTEMS:   Constitutional: Denies fevers, chills or abnormal weight loss Eyes: Denies blurriness of vision Ears, nose, mouth, throat, and face: Denies mucositis or sore throat Respiratory: Denies cough, dyspnea or wheezes Cardiovascular: Denies palpitation, chest discomfort Gastrointestinal: Denies nausea, heartburn or change in bowel habits Skin: Denies abnormal skin rashes Lymphatics: Denies new lymphadenopathy or easy bruising Neurological: Denies numbness, tingling or new weaknesses Behavioral/Psych: Mood is stable, no new changes  Extremities: No lower extremity edema Breast: denies any pain or lumps or nodules in either breasts All other systems were reviewed with the patient and are negative.  I have reviewed the past medical history, past surgical history, social history and family history with the patient and they are unchanged from previous note.  ALLERGIES:  is allergic to ibuprofen; lisinopril; and dilaudid [hydromorphone hcl].  MEDICATIONS:  Current Outpatient Medications  Medication Sig Dispense Refill  . Cholecalciferol (VITAMIN D3) 5000 UNITS TABS Take 1 tablet by mouth daily.    . clobetasol ointment (TEMOVATE) 0.86 % Apply 1 application topically 2 (two) times daily. Then as needed for 30 days  1  . Flaxseed, Linseed, (FLAX SEED OIL PO) Take 5 mLs by mouth every morning.    . gabapentin (NEURONTIN) 300 MG capsule Take 1 capsule (300 mg total) by mouth 3 (three) times daily. 270 capsule 0  . HYDROcodone-acetaminophen (NORCO/VICODIN) 5-325 MG tablet Take 1 tablet by mouth every 4 (four) hours as needed for moderate pain. 30 tablet 0  .  lidocaine (XYLOCAINE) 2 % solution Use as directed 5 mLs in the mouth or throat every 3 (three) hours as needed for mouth pain (swish and swallow of spit). 200 mL 1  . lidocaine-prilocaine (EMLA) cream Apply to affected area once 30 g 3  . LORazepam (ATIVAN) 0.5 MG tablet Take 1  tablet (0.5 mg total) by mouth at bedtime as needed for sleep. 30 tablet 0  . naproxen sodium (ALEVE) 220 MG tablet Take 660 mg by mouth daily as needed (pain).    . nystatin (MYCOSTATIN) 100000 UNIT/ML suspension     . Olmesartan-amLODIPine-HCTZ 40-10-25 MG TABS Take 1 tablet by mouth daily.    Marland Kitchen omeprazole (PRILOSEC) 40 MG capsule Take 1 capsule (40 mg total) by mouth daily. 30 capsule 0  . ondansetron (ZOFRAN) 8 MG tablet Take 1 tablet (8 mg total) by mouth 2 (two) times daily as needed. Start on the third day after chemotherapy. 30 tablet 1  . predniSONE (DELTASONE) 5 MG tablet 6 tab x 1 day, 5 tab x 1 day, 4 tab x 1 day, 3 tab x 1 day, 2 tab x 1 day, 1 tab x 1 day, stop 21 tablet 0  . prochlorperazine (COMPAZINE) 10 MG tablet Take 1 tablet (10 mg total) by mouth every 6 (six) hours as needed (Nausea or vomiting). 30 tablet 1  . promethazine (PHENERGAN) 25 MG tablet Take 1 tablet (25 mg total) by mouth every 6 (six) hours as needed for nausea. 30 tablet 3  . sulfamethoxazole-trimethoprim (BACTRIM DS) 800-160 MG tablet Take 1 tablet by mouth 2 (two) times daily. 14 tablet 0  . tetrahydrozoline-zinc (VISINE-AC) 0.05-0.25 % ophthalmic solution Place 2 drops into both eyes as needed (dry, itchy eyes).     No current facility-administered medications for this visit.     PHYSICAL EXAMINATION: ECOG PERFORMANCE STATUS: 1 - Symptomatic but completely ambulatory  Vitals:   01/22/19 0903  BP: 129/69  Pulse: (!) 106  Resp: 17  Temp: 98.5 F (36.9 C)  SpO2: 100%   Filed Weights   01/22/19 0903  Weight: 257 lb 9.6 oz (116.8 kg)   Exam not performed due to COVID-19 precautions  LABORATORY DATA:  I have reviewed the data as listed CMP Latest Ref Rng & Units 01/15/2019 01/08/2019 01/01/2019  Glucose 70 - 99 mg/dL 99 95 104(H)  BUN 6 - 20 mg/dL _0 Creatinine 0.44 - 1.00 mg/dL 0.86 0.85 1.19(H)  Sodium 135 - 145 mmol/L 139 140 140  Potassium 3.5 - 5.1 mmol/L 3.4(L) 3.7 3.3(L)  Chloride  98 - 111 mmol/L 102 103 104  CO2 22 - 32 mmol/L _1 Calcium 8.9 - 10.3 mg/dL 9.2 9.2 9.4  Total Protein 6.5 - 8.1 g/dL 7.1 7.0 7.3  Total Bilirubin 0.3 - 1.2 mg/dL 0.3 0.4 0.3  Alkaline Phos 38 - 126 U/L 69 69 72  AST 15 - 41 U/L _2 ALT 0 - 44 U/L 29 18 34    Lab Results  Component Value Date   WBC 3.6 (L) 01/22/2019   HGB 10.1 (L) 01/22/2019   HCT 31.0 (L) 01/22/2019   MCV 100.0 01/22/2019   PLT 168 01/22/2019   NEUTROABS 2.5 01/22/2019    ASSESSMENT & PLAN:  Malignant neoplasm of lower-inner quadrant of right breast of female, estrogen receptor positive ([facility]) 08/23/2018:Right lumpectomy: IDC, 0.7 cm, grade 3, margins negative, 0/3 lymph nodes negative, ER 40%, PR 0%, HER-2 negative, Ki-67 50%, T1BN0 stage Ia  Oncotype Dx 44: High Risk  Treatment plan: 1.Adj Chemo with Dose dense adriamycin and Cytoxan foll by Taxol weekly X 12 2. Adjuvant radiation therapy followed by 3. Adjuvant antiestrogen therapy ------------------------------------------------------------------------------------------------------------ Current Treatment:Completed 4 cyclesdose dense Adriamycin and Cytoxan, today cycle10of Taxol  Taxol toxicities: 1.Fatigue: Profound fatigue 2.Mild thrombocytopenia: Platelet count improved remarkably. 3.Chemotherapy-induced anemia: Today's hemoglobin is10.14moitoring closely. 4.Lower back pain after chemo.Appears to be slightly better 5.Chemo-induced peripheral neuropathy: We decreased the dosage of Taxol.  Neuropathy doing much better because she is also soaking her hands in Epsom salt.  I will see her with the next treatment as well.  If her symptoms get worse and we will have to discontinue chemo. We will have to monitor her symptoms.  Patientisicing her fingers and toes to prevent neuropathy.  No orders of the defined types were placed in this encounter.  The patient has a good understanding of the overall plan. she agrees  with it. she will call with any problems that may develop before the next visit here.  GNicholas Lose MD 01/22/2019  IJulious OkaDorshimer am acting as scribe for Dr. VNicholas Lose  I have reviewed the above documentation for accuracy and completeness, and I agree with the above.

## 2019-01-22 ENCOUNTER — Encounter: Payer: Self-pay | Admitting: *Deleted

## 2019-01-22 ENCOUNTER — Other Ambulatory Visit: Payer: Self-pay

## 2019-01-22 ENCOUNTER — Inpatient Hospital Stay: Payer: BC Managed Care – PPO

## 2019-01-22 ENCOUNTER — Inpatient Hospital Stay (HOSPITAL_BASED_OUTPATIENT_CLINIC_OR_DEPARTMENT_OTHER): Payer: BC Managed Care – PPO | Admitting: Hematology and Oncology

## 2019-01-22 ENCOUNTER — Inpatient Hospital Stay: Payer: BC Managed Care – PPO | Attending: Hematology and Oncology

## 2019-01-22 VITALS — HR 96

## 2019-01-22 DIAGNOSIS — G620 Drug-induced polyneuropathy: Secondary | ICD-10-CM

## 2019-01-22 DIAGNOSIS — M545 Low back pain: Secondary | ICD-10-CM

## 2019-01-22 DIAGNOSIS — D6481 Anemia due to antineoplastic chemotherapy: Secondary | ICD-10-CM | POA: Insufficient documentation

## 2019-01-22 DIAGNOSIS — C50311 Malignant neoplasm of lower-inner quadrant of right female breast: Secondary | ICD-10-CM | POA: Insufficient documentation

## 2019-01-22 DIAGNOSIS — T451X5A Adverse effect of antineoplastic and immunosuppressive drugs, initial encounter: Secondary | ICD-10-CM | POA: Insufficient documentation

## 2019-01-22 DIAGNOSIS — Z5111 Encounter for antineoplastic chemotherapy: Secondary | ICD-10-CM | POA: Insufficient documentation

## 2019-01-22 DIAGNOSIS — Z170 Estrogen receptor positive status [ER+]: Secondary | ICD-10-CM

## 2019-01-22 DIAGNOSIS — Z95828 Presence of other vascular implants and grafts: Secondary | ICD-10-CM

## 2019-01-22 LAB — CMP (CANCER CENTER ONLY)
ALT: 19 U/L (ref 0–44)
AST: 22 U/L (ref 15–41)
Albumin: 3.8 g/dL (ref 3.5–5.0)
Alkaline Phosphatase: 71 U/L (ref 38–126)
Anion gap: 10 (ref 5–15)
BUN: 10 mg/dL (ref 6–20)
CO2: 26 mmol/L (ref 22–32)
Calcium: 9.2 mg/dL (ref 8.9–10.3)
Chloride: 103 mmol/L (ref 98–111)
Creatinine: 0.91 mg/dL (ref 0.44–1.00)
GFR, Est AFR Am: >60 mL/min (ref >60)
GFR, Estimated: >60 mL/min (ref >60)
Glucose, Bld: 109 mg/dL — ABNORMAL HIGH (ref 70–99)
Potassium: 3.5 mmol/L (ref 3.5–5.1)
Sodium: 139 mmol/L (ref 135–145)
Total Bilirubin: 0.4 mg/dL (ref 0.3–1.2)
Total Protein: 6.9 g/dL (ref 6.5–8.1)

## 2019-01-22 LAB — CBC WITH DIFFERENTIAL (CANCER CENTER ONLY)
Abs Immature Granulocytes: 0.02 10*3/uL (ref 0.00–0.07)
Basophils Absolute: 0 10*3/uL (ref 0.0–0.1)
Basophils Relative: 0 %
Eosinophils Absolute: 0 10*3/uL (ref 0.0–0.5)
Eosinophils Relative: 1 %
HCT: 31 % — ABNORMAL LOW (ref 36.0–46.0)
Hemoglobin: 10.1 g/dL — ABNORMAL LOW (ref 12.0–15.0)
Immature Granulocytes: 1 %
Lymphocytes Relative: 19 %
Lymphs Abs: 0.7 10*3/uL (ref 0.7–4.0)
MCH: 32.6 pg (ref 26.0–34.0)
MCHC: 32.6 g/dL (ref 30.0–36.0)
MCV: 100 fL (ref 80.0–100.0)
Monocytes Absolute: 0.3 10*3/uL (ref 0.1–1.0)
Monocytes Relative: 9 %
Neutro Abs: 2.5 10*3/uL (ref 1.7–7.7)
Neutrophils Relative %: 70 %
Platelet Count: 168 10*3/uL (ref 150–400)
RBC: 3.1 MIL/uL — ABNORMAL LOW (ref 3.87–5.11)
RDW: 14.5 % (ref 11.5–15.5)
WBC Count: 3.6 10*3/uL — ABNORMAL LOW (ref 4.0–10.5)
nRBC: 0.6 % — ABNORMAL HIGH (ref 0.0–0.2)

## 2019-01-22 MED ORDER — SODIUM CHLORIDE 0.9 % IV SOLN
Freq: Once | INTRAVENOUS | Status: AC
  Administered 2019-01-22: 10:00:00 via INTRAVENOUS
  Filled 2019-01-22: qty 250

## 2019-01-22 MED ORDER — SODIUM CHLORIDE 0.9% FLUSH
10.0000 mL | INTRAVENOUS | Status: DC | PRN
  Administered 2019-01-22: 10 mL
  Filled 2019-01-22: qty 10

## 2019-01-22 MED ORDER — HEPARIN SOD (PORK) LOCK FLUSH 100 UNIT/ML IV SOLN
500.0000 [IU] | Freq: Once | INTRAVENOUS | Status: AC | PRN
  Administered 2019-01-22: 500 [IU]
  Filled 2019-01-22: qty 5

## 2019-01-22 MED ORDER — SODIUM CHLORIDE 0.9% FLUSH
10.0000 mL | Freq: Once | INTRAVENOUS | Status: AC
  Administered 2019-01-22: 10 mL
  Filled 2019-01-22: qty 10

## 2019-01-22 MED ORDER — DIPHENHYDRAMINE HCL 50 MG/ML IJ SOLN
50.0000 mg | Freq: Once | INTRAMUSCULAR | Status: AC
  Administered 2019-01-22: 50 mg via INTRAVENOUS

## 2019-01-22 MED ORDER — DIPHENHYDRAMINE HCL 50 MG/ML IJ SOLN
INTRAMUSCULAR | Status: AC
  Filled 2019-01-22: qty 1

## 2019-01-22 MED ORDER — SODIUM CHLORIDE 0.9 % IV SOLN
45.0000 mg/m2 | Freq: Once | INTRAVENOUS | Status: AC
  Administered 2019-01-22: 102 mg via INTRAVENOUS
  Filled 2019-01-22: qty 17

## 2019-01-22 NOTE — Patient Instructions (Signed)
[facility] Discharge Instructions for Patients Receiving Chemotherapy  Today you received the following chemotherapy agents Paclitaxel (TAXOL).  To help prevent nausea and vomiting after your treatment, we encourage you to take your nausea medication as prescribed.  If you develop nausea and vomiting that is not controlled by your nausea medication, call the clinic.   BELOW ARE SYMPTOMS THAT SHOULD BE REPORTED IMMEDIATELY:  *FEVER GREATER THAN 100.5 F  *CHILLS WITH OR WITHOUT FEVER  NAUSEA AND VOMITING THAT IS NOT CONTROLLED WITH YOUR NAUSEA MEDICATION  *UNUSUAL SHORTNESS OF BREATH  *UNUSUAL BRUISING OR BLEEDING  TENDERNESS IN MOUTH AND THROAT WITH OR WITHOUT PRESENCE OF ULCERS  *URINARY PROBLEMS  *BOWEL PROBLEMS  UNUSUAL RASH Items with * indicate a potential emergency and should be followed up as soon as possible.  Feel free to call the clinic should you have any questions or concerns. The clinic phone number is [phone].  Please show the CHEMO ALERT CARD at check-in to the Emergency Department and triage nurse.  Coronavirus (COVID-19) Are you at risk?  Are you at risk for the Coronavirus (COVID-19)?  To be considered HIGH RISK for Coronavirus (COVID-19), you have to meet the following criteria:  . Traveled to [place], [place], [name], [place] or [place]; or in the [place] to [place], [place], [place], or [place]; and have fever, cough, and shortness of breath within the last 2 weeks of travel OR . Been in close contact with a person diagnosed with COVID-19 within the last 2 weeks and have fever, cough, and shortness of breath . IF YOU DO NOT MEET THESE CRITERIA, YOU ARE CONSIDERED LOW RISK FOR COVID-19.  What to do if you are HIGH RISK for COVID-19?  . If you are having a medical emergency, call 911. . Seek medical care right away. Before you go to a doctor's office, urgent care or emergency department, call ahead and tell them about  your recent travel, contact with someone diagnosed with COVID-19, and your symptoms. You should receive instructions from your physician's office regarding next steps of care.  . When you arrive at healthcare provider, tell the healthcare staff immediately you have returned from visiting [place], [place], [place], [place] or [name]; or traveled in the [place] to [place], [place], [place], or [place]; in the last two weeks or you have been in close contact with a person diagnosed with COVID-19 in the last 2 weeks.   . Tell the health care staff about your symptoms: fever, cough and shortness of breath. . After you have been seen by a medical provider, you will be either: o Tested for (COVID-19) and discharged home on quarantine except to seek medical care if symptoms worsen, and asked to  - Stay home and avoid contact with others until you get your results (4-5 days)  - Avoid travel on public transportation if possible (such as bus, train, or airplane) or o Sent to the Emergency Department by EMS for evaluation, COVID-19 testing, and possible admission depending on your condition and test results.  What to do if you are LOW RISK for COVID-19?  Reduce your risk of any infection by using the same precautions used for avoiding the common cold or flu:  . Wash your hands often with soap and warm water for at least 20 seconds.  If soap and water are not readily available, use an alcohol-based hand sanitizer with at least 60% alcohol.  . If coughing or sneezing,   cover your mouth and nose by coughing or sneezing into the elbow areas of your shirt or coat, into a tissue or into your sleeve (not your hands). . Avoid shaking hands with others and consider head nods or verbal greetings only. . Avoid touching your eyes, nose, or mouth with unwashed hands.  . Avoid close contact with people who are sick. . Avoid places or events with large numbers of people in one location, like concerts or sporting  events. . Carefully consider travel plans you have or are making. . If you are planning any travel outside or inside the [place], visit the CDC's Travelers' Health webpage for the latest health notices. . If you have some symptoms but not all symptoms, continue to monitor at home and seek medical attention if your symptoms worsen. . If you are having a medical emergency, call 911.   ADDITIONAL HEALTHCARE OPTIONS FOR PATIENTS  [facility] / e-Visit: [email or web address]         [facility]: [phone]  [facility]: [phone]                   [facility]: [phone]     

## 2019-01-23 ENCOUNTER — Telehealth: Payer: Self-pay | Admitting: Hematology and Oncology

## 2019-01-23 ENCOUNTER — Encounter: Payer: Self-pay | Admitting: General Practice

## 2019-01-23 NOTE — Telephone Encounter (Signed)
I left a message regarding 6/10

## 2019-01-23 NOTE — Progress Notes (Signed)
[facility] CSW Progress Notes  PAtient awarded grant from [employer] to [employer] and utilities for July and August 2020.  Will need to submit updated treatment plan if her treatments extend beyond Sept 1, 2020 - agency may be willing to fund September bills if she is still in active treatment.  Edwyna Shell, LCSW Clinical Social Worker Phone:  [phone]

## 2019-01-28 NOTE — Progress Notes (Signed)
Patient Care Team: [name], [name], MD as PCP - General (Family Medicine) Erroll Luna, MD as Consulting Physician (General Surgery) Nicholas Lose, MD as Consulting Physician (Hematology and Oncology) Kyung Rudd, MD as Consulting Physician (Radiation Oncology)  DIAGNOSIS:    ICD-10-CM   1. Malignant neoplasm of lower-inner quadrant of right breast of female, estrogen receptor positive ([name]) C50.311    Z17.0     SUMMARY OF ONCOLOGIC HISTORY:   Malignant neoplasm of lower-inner quadrant of right breast of female, estrogen receptor positive ([name] [name])   07/16/2018 Initial Diagnosis    Pain right breast UOQ: Negative on mammogram, incidentally found right breast mass LIQ at 3:30 position measuring 0.6 cm biopsy revealed grade 3 IDC ER 40%, PR 0%, HER-2 negative, Ki-67 50%, T1bN0 stage Ib    07/24/2018 Cancer Staging    Staging form: Breast, AJCC 8th Edition - Clinical: Stage IB (cT1b, cN0, cM0, G3, ER+, PR-, HER2-) - Signed by Nicholas Lose, MD on 07/24/2018    08/23/2018 Surgery    Right lumpectomy: IDC, 0.7 cm, grade 3, margins negative, 0/3 lymph nodes negative, ER 40%, PR 0%, HER-2 negative, Ki-67 50%, T1BN0 stage Ia    09/04/2018 Cancer Staging    Staging form: Breast, AJCC 8th Edition - Pathologic: Stage IA (pT1b, pN0, cM0, G3, ER+, PR-, HER2-) - Signed by Gardenia Phlegm, NP on 09/04/2018    09/25/2018 -  Chemotherapy    Adjuvant chemotherapy with dose dense Adriamycin and Cytoxan x4 followed by Taxol weekly x12.       CHIEF COMPLIANT: Cycle 11 Taxol  INTERVAL HISTORY: [name] [name] is a 53 y.o. with above-mentioned history of right breast cancer treated with lumpectomywhocompleted 4 cycles of neoadjuvant dose dense Adriamycin and Cytoxan and is currently receiving weekly Taxol treatments.She presents to the clinic today for cycle 11.  Worsening peripheral neuropathy with increased numbness of her feet. Continues to have severe fatigue.  REVIEW OF  SYSTEMS:   Constitutional: Denies fevers, chills or abnormal weight loss Eyes: Denies blurriness of vision Ears, nose, mouth, throat, and face: Denies mucositis or sore throat Respiratory: Denies cough, dyspnea or wheezes Cardiovascular: Denies palpitation, chest discomfort Gastrointestinal: Denies nausea, heartburn or change in bowel habits Skin: Denies abnormal skin rashes Lymphatics: Denies new lymphadenopathy or easy bruising Neurological: Worsening neuropathy in her entire foot bilaterally Behavioral/Psych: Mood is stable, no new changes  Extremities: No lower extremity edema Breast: denies any pain or lumps or nodules in either breasts All other systems were reviewed with the patient and are negative.  I have reviewed the past medical history, past surgical history, social history and family history with the patient and they are unchanged from previous note.  ALLERGIES:  is allergic to ibuprofen; lisinopril; and dilaudid [hydromorphone hcl].  MEDICATIONS:  Current Outpatient Medications  Medication Sig Dispense Refill  . Cholecalciferol (VITAMIN D3) 5000 UNITS TABS Take 1 tablet by mouth daily.    . clobetasol ointment (TEMOVATE) 2.22 % Apply 1 application topically 2 (two) times daily. Then as needed for 30 days  1  . Flaxseed, Linseed, (FLAX SEED OIL PO) Take 5 mLs by mouth every morning.    . gabapentin (NEURONTIN) 300 MG capsule Take 1 capsule (300 mg total) by mouth 3 (three) times daily. 270 capsule 0  . HYDROcodone-acetaminophen (NORCO/VICODIN) 5-325 MG tablet Take 1 tablet by mouth every 4 (four) hours as needed for moderate pain. 30 tablet 0  . lidocaine (XYLOCAINE) 2 % solution Use as directed 5 mLs in the  mouth or throat every 3 (three) hours as needed for mouth pain (swish and swallow of spit). 200 mL 1  . lidocaine-prilocaine (EMLA) cream Apply to affected area once 30 g 3  . LORazepam (ATIVAN) 0.5 MG tablet Take 1 tablet (0.5 mg total) by mouth at bedtime as needed  for sleep. 30 tablet 0  . naproxen sodium (ALEVE) 220 MG tablet Take 660 mg by mouth daily as needed (pain).    . nystatin (MYCOSTATIN) 100000 UNIT/ML suspension     . Olmesartan-amLODIPine-HCTZ 40-10-25 MG TABS Take 1 tablet by mouth daily.    Marland Kitchen omeprazole (PRILOSEC) 40 MG capsule Take 1 capsule (40 mg total) by mouth daily. 30 capsule 0  . ondansetron (ZOFRAN) 8 MG tablet Take 1 tablet (8 mg total) by mouth 2 (two) times daily as needed. Start on the third day after chemotherapy. 30 tablet 1  . predniSONE (DELTASONE) 5 MG tablet 6 tab x 1 day, 5 tab x 1 day, 4 tab x 1 day, 3 tab x 1 day, 2 tab x 1 day, 1 tab x 1 day, stop 21 tablet 0  . prochlorperazine (COMPAZINE) 10 MG tablet Take 1 tablet (10 mg total) by mouth every 6 (six) hours as needed (Nausea or vomiting). 30 tablet 1  . promethazine (PHENERGAN) 25 MG tablet Take 1 tablet (25 mg total) by mouth every 6 (six) hours as needed for nausea. 30 tablet 3  . sulfamethoxazole-trimethoprim (BACTRIM DS) 800-160 MG tablet Take 1 tablet by mouth 2 (two) times daily. 14 tablet 0  . tetrahydrozoline-zinc (VISINE-AC) 0.05-0.25 % ophthalmic solution Place 2 drops into both eyes as needed (dry, itchy eyes).     No current facility-administered medications for this visit.     PHYSICAL EXAMINATION: ECOG PERFORMANCE STATUS: 1 - Symptomatic but completely ambulatory  Vitals:   01/29/19 1048  BP: 129/65  Pulse: (!) 104  Resp: 18  Temp: 98.5 F (36.9 C)  SpO2: 100%   Filed Weights   01/29/19 1048  Weight: 255 lb 14.4 oz (116.1 kg)    GENERAL: alert, no distress and comfortable SKIN: skin color, texture, turgor are normal, no rashes or significant lesions EYES: normal, Conjunctiva are pink and non-injected, sclera clear OROPHARYNX: no exudate, no erythema and lips, buccal mucosa, and tongue normal  NECK: supple, thyroid normal size, non-tender, without nodularity LYMPH: no palpable lymphadenopathy in the cervical, axillary or inguinal LUNGS:  clear to auscultation and percussion with normal breathing effort HEART: regular rate & rhythm and no murmurs and no lower extremity edema ABDOMEN: abdomen soft, non-tender and normal bowel sounds MUSCULOSKELETAL: no cyanosis of digits and no clubbing  NEURO: alert & oriented x 3 with fluent speech, no focal motor/sensory deficits EXTREMITIES: No lower extremity edema  LABORATORY DATA:  I have reviewed the data as listed CMP Latest Ref Rng & Units 01/22/2019 01/15/2019 01/08/2019  Glucose 70 - 99 mg/dL 109(H) 99 95  BUN 6 - 20 mg/dL '10 9 12  ' Creatinine 0.44 - 1.00 mg/dL 0.91 0.86 0.85  Sodium 135 - 145 mmol/L 139 139 140  Potassium 3.5 - 5.1 mmol/L 3.5 3.4(L) 3.7  Chloride 98 - 111 mmol/L 103 102 103  CO2 22 - 32 mmol/L '26 28 26  ' Calcium 8.9 - 10.3 mg/dL 9.2 9.2 9.2  Total Protein 6.5 - 8.1 g/dL 6.9 7.1 7.0  Total Bilirubin 0.3 - 1.2 mg/dL 0.4 0.3 0.4  Alkaline Phos 38 - 126 U/L 71 69 69  AST 15 - 41 U/L 22  30 19  ALT 0 - 44 U/L '19 29 18    ' Lab Results  Component Value Date   WBC 4.0 01/29/2019   HGB 10.5 (L) 01/29/2019   HCT 32.1 (L) 01/29/2019   MCV 99.4 01/29/2019   PLT 173 01/29/2019   NEUTROABS 2.8 01/29/2019    ASSESSMENT & PLAN:  Malignant neoplasm of lower-inner quadrant of right breast of female, estrogen receptor positive ([facility]) 08/23/2018:Right lumpectomy: IDC, 0.7 cm, grade 3, margins negative, 0/3 lymph nodes negative, ER 40%, PR 0%, HER-2 negative, Ki-67 50%, T1BN0 stage Ia  Oncotype Dx 44: High Risk  Treatment plan: 1.Adj Chemo with Dose dense adriamycin and Cytoxan foll by Taxol weekly X 12 2. Adjuvant radiation therapy followed by 3. Adjuvant antiestrogen therapy ------------------------------------------------------------------------------------------------------------ Current Treatment:Completed 4 cyclesdose dense Adriamycin and Cytoxan, completed 10 cycles of Taxol, stopped early due to neuropathy  Chemo-induced peripheral neuropathy: This is gotten  markedly worse to the point that further treatment could be detrimental to her health.  Because of this I recommended discontinuation of further chemo. I sent a message to Dr. Brantley Stage to remove the port. We will make a referral for radiation to Dr. Lisbeth Renshaw. Adjuvant radiation therapy followed by antiestrogen therapy as a treatment plan.  I will see her on the last day of radiation.  No orders of the defined types were placed in this encounter.  The patient has a good understanding of the overall plan. she agrees with it. she will call with any problems that may develop before the next visit here.  Nicholas Lose, MD 01/29/2019  Julious Oka Dorshimer am acting as scribe for Dr. Nicholas Lose.  I have reviewed the above documentation for accuracy and completeness, and I agree with the above.

## 2019-01-29 ENCOUNTER — Other Ambulatory Visit: Payer: BLUE CROSS/BLUE SHIELD

## 2019-01-29 ENCOUNTER — Ambulatory Visit: Payer: BLUE CROSS/BLUE SHIELD

## 2019-01-29 ENCOUNTER — Other Ambulatory Visit: Payer: Self-pay

## 2019-01-29 ENCOUNTER — Inpatient Hospital Stay: Payer: BC Managed Care – PPO

## 2019-01-29 ENCOUNTER — Inpatient Hospital Stay (HOSPITAL_BASED_OUTPATIENT_CLINIC_OR_DEPARTMENT_OTHER): Payer: BC Managed Care – PPO | Admitting: Hematology and Oncology

## 2019-01-29 ENCOUNTER — Telehealth: Payer: Self-pay | Admitting: *Deleted

## 2019-01-29 DIAGNOSIS — C50311 Malignant neoplasm of lower-inner quadrant of right female breast: Secondary | ICD-10-CM

## 2019-01-29 DIAGNOSIS — Z170 Estrogen receptor positive status [ER+]: Secondary | ICD-10-CM

## 2019-01-29 DIAGNOSIS — G620 Drug-induced polyneuropathy: Secondary | ICD-10-CM

## 2019-01-29 DIAGNOSIS — Z95828 Presence of other vascular implants and grafts: Secondary | ICD-10-CM

## 2019-01-29 LAB — CBC WITH DIFFERENTIAL (CANCER CENTER ONLY)
Abs Immature Granulocytes: 0.03 10*3/uL (ref 0.00–0.07)
Basophils Absolute: 0 10*3/uL (ref 0.0–0.1)
Basophils Relative: 0 %
Eosinophils Absolute: 0 10*3/uL (ref 0.0–0.5)
Eosinophils Relative: 1 %
HCT: 32.1 % — ABNORMAL LOW (ref 36.0–46.0)
Hemoglobin: 10.5 g/dL — ABNORMAL LOW (ref 12.0–15.0)
Immature Granulocytes: 1 %
Lymphocytes Relative: 19 %
Lymphs Abs: 0.8 10*3/uL (ref 0.7–4.0)
MCH: 32.5 pg (ref 26.0–34.0)
MCHC: 32.7 g/dL (ref 30.0–36.0)
MCV: 99.4 fL (ref 80.0–100.0)
Monocytes Absolute: 0.4 10*3/uL (ref 0.1–1.0)
Monocytes Relative: 9 %
Neutro Abs: 2.8 10*3/uL (ref 1.7–7.7)
Neutrophils Relative %: 70 %
Platelet Count: 173 10*3/uL (ref 150–400)
RBC: 3.23 MIL/uL — ABNORMAL LOW (ref 3.87–5.11)
RDW: 14 % (ref 11.5–15.5)
WBC Count: 4 10*3/uL (ref 4.0–10.5)
nRBC: 0.8 % — ABNORMAL HIGH (ref 0.0–0.2)

## 2019-01-29 LAB — CMP (CANCER CENTER ONLY)
ALT: 26 U/L (ref 0–44)
AST: 24 U/L (ref 15–41)
Albumin: 4 g/dL (ref 3.5–5.0)
Alkaline Phosphatase: 79 U/L (ref 38–126)
Anion gap: 11 (ref 5–15)
BUN: 11 mg/dL (ref 6–20)
CO2: 27 mmol/L (ref 22–32)
Calcium: 9.4 mg/dL (ref 8.9–10.3)
Chloride: 102 mmol/L (ref 98–111)
Creatinine: 0.89 mg/dL (ref 0.44–1.00)
GFR, Est AFR Am: >60 mL/min (ref >60)
GFR, Estimated: >60 mL/min (ref >60)
Glucose, Bld: 104 mg/dL — ABNORMAL HIGH (ref 70–99)
Potassium: 3.4 mmol/L — ABNORMAL LOW (ref 3.5–5.1)
Sodium: 140 mmol/L (ref 135–145)
Total Bilirubin: 0.3 mg/dL (ref 0.3–1.2)
Total Protein: 7.1 g/dL (ref 6.5–8.1)

## 2019-01-29 MED ORDER — SODIUM CHLORIDE 0.9% FLUSH
10.0000 mL | Freq: Once | INTRAVENOUS | Status: AC
  Administered 2019-01-29: 10 mL
  Filled 2019-01-29: qty 10

## 2019-01-29 NOTE — Assessment & Plan Note (Deleted)
08/23/2018:Right lumpectomy: IDC, 0.7 cm, grade 3, margins negative, 0/3 lymph nodes negative, ER 40%, PR 0%, HER-2 negative, Ki-67 50%, T1BN0 stage Ia  Oncotype Dx 44: High Risk  Treatment plan: 1.Adj Chemo with Dose dense adriamycin and Cytoxan foll by Taxol weekly X 12 2. Adjuvant radiation therapy followed by 3. Adjuvant antiestrogen therapy ------------------------------------------------------------------------------------------------------------ Current Treatment:Completed 4 cyclesdose dense Adriamycin and Cytoxan, today cycle12of Taxol  Taxol toxicities: 1.Fatigue: Profound fatigue 2.Mild thrombocytopenia: Platelet count improved remarkably. 3.Chemotherapy-induced anemia: Today's hemoglobin is10.58moitoring closely. 4.Lower back pain after chemo.Appears to be slightly better 5.Chemo-induced peripheral neuropathy: We decreasedthe dosage of Taxol. Neuropathy doing much better because she is also soaking her hands in Epsom salt. Patientisicing her fingers and toes to prevent worsening of neuropathy.  I will see her with the next treatment as well.  If her symptoms get worse and we will have to discontinue chemo. We will have to monitor her symptoms.  We will arrange for radiation oncology follow-up for adjuvant radiation therapy. Plan to see her after radiation is complete to start antiestrogen therapy.

## 2019-01-29 NOTE — Telephone Encounter (Signed)
Spoke to pt regarding next steps after chemo completed. Congratulate pt on completing chemo. Informed Dr. Ida Rogue office will call with appt to discuss xrt. Referral placed. Discussed Dr. Josetta Huddle office will call with a an appt for port removal. Received verbal understanding. Denies further needs or questions.

## 2019-01-29 NOTE — Assessment & Plan Note (Signed)
08/23/2018:Right lumpectomy: IDC, 0.7 cm, grade 3, margins negative, 0/3 lymph nodes negative, ER 40%, PR 0%, HER-2 negative, Ki-67 50%, T1BN0 stage Ia  Oncotype Dx 44: High Risk  Treatment plan: 1.Adj Chemo with Dose dense adriamycin and Cytoxan foll by Taxol weekly X 12 2. Adjuvant radiation therapy followed by 3. Adjuvant antiestrogen therapy ------------------------------------------------------------------------------------------------------------ Current Treatment:Completed 4 cyclesdose dense Adriamycin and Cytoxan, today cycle12of Taxol  Taxol toxicities: 1.Fatigue: Profound fatigue 2.Mild thrombocytopenia: Platelet count improved remarkably. 3.Chemotherapy-induced anemia: Today's hemoglobin is10.70moitoring closely. 4.Lower back pain after chemo.Appears to be slightly better 5.Chemo-induced peripheral neuropathy: We decreasedthe dosage of Taxol. Neuropathy doing much better because she is also soaking her hands in Epsom salt. Patientisicing her fingers and toes to prevent neuropathy.  We will make a referral for radiation. Adjuvant radiation therapy followed by antiestrogen therapy as a treatment plan.

## 2019-01-30 ENCOUNTER — Encounter: Payer: Self-pay | Admitting: General Practice

## 2019-01-30 ENCOUNTER — Telehealth: Payer: Self-pay | Admitting: Radiation Oncology

## 2019-01-30 NOTE — Progress Notes (Signed)
[place] CSW Progress Notess  Patient did not pick up emailed information about [employer], called patient to advise her and mailed copy of award email.  Edwyna Shell, LCSW Clinical Social Worker Phone:  [phone]

## 2019-01-30 NOTE — Telephone Encounter (Signed)
LM for pt explaining purpose of visit with Dr. Lisbeth Renshaw to coordinate next steps for XRT

## 2019-02-03 ENCOUNTER — Ambulatory Visit: Payer: Self-pay | Admitting: Surgery

## 2019-02-03 ENCOUNTER — Encounter: Payer: Self-pay | Admitting: *Deleted

## 2019-02-03 NOTE — H&P (Signed)
[name] [name] Documented: 02/03/2019 9:44 AM Location: [facility] Patient #: [id number] DOB: May 08, 1966 Single / Language: Cleophus [name] / Race: Black or African American Female  History of Present Illness [name] A. [name] MD; 02/03/2019 11:18 AM) Patient words: Patient returns after adjuvant therapy for right breast cancer with chemotherapy. She developed neuropathy in her treatments were terminated early. She no longer requires her Port-A-Cath and desires removal. She is slowly recovering in the neuropathy is Better with Neurontin.  The patient is a 53 year old female.   Allergies [name], CMA; 02/03/2019 9:45 AM) Ibuprofen *ANALGESICS - ANTI-INFLAMMATORY* Swelling. Lisinopril *ANTIHYPERTENSIVES* Swelling. Allergies Reconciled  Medication History [name], CMA; 02/03/2019 9:45 AM) Clobetasol Propionate (0.05% Ointment, External) Active. Naproxen (500MG  Tablet, Oral) Active. Vitamin D-3 (5000UNIT Tablet, Oral) Active. Multiple Vitamin (1 (one) Oral) Active. Omega 3 (1000MG  Capsule, Oral) Active. Benicar HCT (40-12.5MG  Tablet, Oral) Active. Medications Reconciled    Vitals [name] CMA; 02/03/2019 9:45 AM) 02/03/2019 9:45 AM Weight: 257 lb Height: 66in Body Surface Area: 2.23 m Body Mass Index: 41.48 kg/m  Temp.: 98.8F  Pulse: 116 (Regular)  BP: 148/86 (Sitting, Left Arm, Standard)        Physical Exam ([name] A. [name] MD; 02/03/2019 11:19 AM)  General Note: Alopecia noted  Chest and Lung Exam Note: Port-A-Cath right clavicle noted. Lungs clear  Breast Note: Postoperative changes noted on the right Left breast normal  Cardiovascular Cardiovascular examination reveals -normal heart sounds, regular rate and rhythm with no murmurs and normal pedal pulses bilaterally.  Neurologic Neurologic evaluation reveals -alert and oriented x 3 with no impairment of recent or remote memory. Mental  Status-Normal.  Musculoskeletal Normal Exam - Left-Upper Extremity Strength Normal and Lower Extremity Strength Normal. Normal Exam - Right-Upper Extremity Strength Normal and Lower Extremity Strength Normal.    Assessment & Plan ([name] A. [name] MD; 02/03/2019 11:19 AM)  HISTORY OF BREAST CANCER (Z85.3) Impression: Port in place Schedule Port-A-Cath removal. The risk and benefits discussed with the patient.  Current Plans Pt Education - CCS Free Text Education/Instructions: discussed with patient and provided information. I recommended surgery to remove the catheter. I explained the technique of removal with use of local anesthesia & possible need for more aggressive sedation/anesthesia for patient comfort.  Risks such as bleeding, infection, and other risks were discussed. Post-operative dressing/incision care was discussed. I noted a good likelihood this will help address the problem. We will work to minimize complications. Questions were answered. The patient expresses understanding & wishes to proceed with surgery.

## 2019-02-05 ENCOUNTER — Other Ambulatory Visit: Payer: Self-pay

## 2019-02-05 ENCOUNTER — Ambulatory Visit
Admission: RE | Admit: 2019-02-05 | Discharge: 2019-02-05 | Disposition: A | Discharge status: Home / Self Care | Payer: BC Managed Care – PPO | Source: Ambulatory Visit | Attending: Hematology and Oncology | Admitting: Hematology and Oncology

## 2019-02-05 ENCOUNTER — Ambulatory Visit: Payer: BLUE CROSS/BLUE SHIELD | Admitting: Hematology and Oncology

## 2019-02-05 ENCOUNTER — Other Ambulatory Visit: Payer: BLUE CROSS/BLUE SHIELD

## 2019-02-05 ENCOUNTER — Ambulatory Visit: Payer: BLUE CROSS/BLUE SHIELD

## 2019-02-05 VITALS — Wt 255.0 lb

## 2019-02-05 DIAGNOSIS — C50311 Malignant neoplasm of lower-inner quadrant of right female breast: Secondary | ICD-10-CM

## 2019-02-05 DIAGNOSIS — Z170 Estrogen receptor positive status [ER+]: Secondary | ICD-10-CM

## 2019-02-05 NOTE — Progress Notes (Signed)
Radiation Oncology         [phone]) [phone] ________________________________  Name: [name] [name]        MRN: [id number]  Date of Service: 02/05/2019 DOB: 28-Jun-1966  [id number], [name], MD  Nicholas Lose, MD     REFERRING PHYSICIAN: Nicholas Lose, MD   DIAGNOSIS: The encounter diagnosis was Malignant neoplasm of lower-inner quadrant of right breast of female, estrogen receptor positive ([place]).   HISTORY OF PRESENT ILLNESS: [name] [name] is a 53 y.o. female originally seen in the multidisciplinary breast clinic for a new diagnosis of right breast cancer. The patient was noted to have a palpable area of pain in the right breast in the upper outer quadrant that prompted diagnostic imaging. Interestingly there was no focal findings where the patient had pain but did detect a mass in the lower inner quadrant at 3:30 measuring x 5 x 4 mm, and her axilla was negative for adenopathy. She underwent a biopsy of the 3:30 mass on 07/12/18 that revealed a grade 3 invasive ductal carcinoma. Her tumor was ER positive, PR negative, and HER2 was negative with a Ki 67 of 50%.  She subsequently underwent right lumpectomy with sentinel node biopsy on 08/23/2018.  Final pathology revealed a 7 mm grade 3 invasive ductal carcinoma.  Her margins were clear, and her 3 sampled lymph nodes were negative.  She did have MammaPrint testing which revealed a high risk score of 44.  She began systemic Adriamycin and Cytoxan on 09/25/2018.  She received 10 infusions of weekly Taxol however this was discontinued on 01/22/2019 early due to progressive peripheral neuropathy despite dose reduction.  She is planning to undergo Port-A-Cath removal, and states her port is in the right chest.  She is waiting to schedule this, and is seen today via my chart to discuss treatment options in the adjuvant setting to reduce risks of local recurrence.   PREVIOUS RADIATION THERAPY: No   PAST MEDICAL HISTORY:  Past Medical History:   Diagnosis Date  . Allergy    seasonal  . Anal fissure   . Complication of anesthesia   . Diverticulitis   . GERD (gastroesophageal reflux disease) 07/07/2005  . Hemorrhoid   . Hepatic steatosis   . Hypertension   . Internal hemorrhoids   . Malignant neoplasm of lower-inner quadrant of right breast of female, estrogen receptor positive ([place]) 07/16/2018  . PONV (postoperative nausea and vomiting)   . Positive H. pylori test        PAST SURGICAL HISTORY: Past Surgical History:  Procedure Laterality Date  . BREAST LUMPECTOMY WITH RADIOACTIVE SEED AND SENTINEL LYMPH NODE BIOPSY Right 08/23/2018   Procedure: RIGHT BREAST LUMPECTOMY WITH RADIOACTIVE SEED AND RIGHT SENTINEL LYMPH NODE MAPPING;  Surgeon: Erroll Luna, MD;  Location: [facility];  Service: General;  Laterality: Right;  . COLON RESECTION  2000's Dr. Hassell Done   Diverticulitis.  . COLONOSCOPY    . FOOT SURGERY Left   . g3p2 c-section1    . KNEE SURGERY Right   . PORTACATH PLACEMENT Right 09/24/2018   Procedure: INSERTION PORT-A-CATH WITH ULTRASOUND;  Surgeon: Erroll Luna, MD;  Location: [facility];  Service: General;  Laterality: Right;  . TONSILLECTOMY    . TUBAL LIGATION       FAMILY HISTORY:  Family History  Problem Relation Age of Onset  . Hypertension Mother   . Hypertension Father   . Heart disease Father   . Diabetes Neg Hx   . COPD Neg Hx  SOCIAL HISTORY:  reports that she has never smoked. She has never used smokeless tobacco. She reports that she does not drink alcohol or use drugs. The patient is divorced and lives in [place]. She is a Biochemist, clinical at a [employer], but has not been working due to her recent chemotherapy treatment and coronavirus pandemic.    ALLERGIES: Ibuprofen, Lisinopril, and Dilaudid [hydromorphone hcl]   MEDICATIONS:  Current Outpatient Medications  Medication Sig Dispense Refill  . Cholecalciferol (VITAMIN D3) 5000 UNITS TABS Take 1 tablet by mouth daily.     . clobetasol ointment (TEMOVATE) 1.27 % Apply 1 application topically 2 (two) times daily. Then as needed for 30 days  1  . Flaxseed, Linseed, (FLAX SEED OIL PO) Take 5 mLs by mouth every morning.    . gabapentin (NEURONTIN) 300 MG capsule Take 1 capsule (300 mg total) by mouth 3 (three) times daily. 270 capsule 0  . HYDROcodone-acetaminophen (NORCO/VICODIN) 5-325 MG tablet Take 1 tablet by mouth every 4 (four) hours as needed for moderate pain. 30 tablet 0  . lidocaine (XYLOCAINE) 2 % solution Use as directed 5 mLs in the mouth or throat every 3 (three) hours as needed for mouth pain (swish and swallow of spit). 200 mL 1  . naproxen sodium (ALEVE) 220 MG tablet Take 660 mg by mouth daily as needed (pain).    . nystatin (MYCOSTATIN) 100000 UNIT/ML suspension     . Olmesartan-amLODIPine-HCTZ 40-10-25 MG TABS Take 1 tablet by mouth daily.    Marland Kitchen omeprazole (PRILOSEC) 40 MG capsule Take 1 capsule (40 mg total) by mouth daily. 30 capsule 0  . predniSONE (DELTASONE) 5 MG tablet 6 tab x 1 day, 5 tab x 1 day, 4 tab x 1 day, 3 tab x 1 day, 2 tab x 1 day, 1 tab x 1 day, stop 21 tablet 0  . promethazine (PHENERGAN) 25 MG tablet Take 1 tablet (25 mg total) by mouth every 6 (six) hours as needed for nausea. 30 tablet 3  . sulfamethoxazole-trimethoprim (BACTRIM DS) 800-160 MG tablet Take 1 tablet by mouth 2 (two) times daily. 14 tablet 0  . tetrahydrozoline-zinc (VISINE-AC) 0.05-0.25 % ophthalmic solution Place 2 drops into both eyes as needed (dry, itchy eyes).     No current facility-administered medications for this visit.      REVIEW OF SYSTEMS: On review of systems, the patient reports that she is doing well overall.  She is still quite fatigued from her previous chemotherapy regimen.  She states that she is ready for her hair to grow back, and for her neuropathy to improve.  She feels as though she is done well since her surgery.  She denies any chest pain, shortness of breath, cough, fevers, chills,  night sweats, unintended weight changes. She denies any bowel or bladder disturbances, and denies abdominal pain, nausea or vomiting. She denies any new musculoskeletal or joint aches or pains. A complete review of systems is obtained and is otherwise negative.     PHYSICAL EXAM:  Wt Readings from Last 3 Encounters:  01/29/19 255 lb 14.4 oz (116.1 kg)  01/22/19 257 lb 9.6 oz (116.8 kg)  01/08/19 255 lb 9.6 oz (115.9 kg)   Temp Readings from Last 3 Encounters:  01/29/19 98.5 F (36.9 C) (Oral)  01/22/19 98.5 F (36.9 C) (Oral)  01/15/19 98.5 F (36.9 C) (Oral)   BP Readings from Last 3 Encounters:  01/29/19 129/65  01/22/19 129/69  01/15/19 115/72   Pulse Readings from Last  3 Encounters:  01/29/19 (!) 104  01/22/19 96  01/22/19 (!) 106     In general this is a well appearing African American female in no acute distress. She is alert and oriented x4 and appropriate throughout the examination. HEENT reveals that the patient is normocephalic, atraumatic. EOMs are intact. Skin is intact without any evidence of gross lesions. Cardiopulmonary assessment is negative for acute distress and she exhibits normal effort.  Breast exam is deferred.  ECOG = 0  0 - Asymptomatic (Fully active, able to carry on all predisease activities without restriction)  1 - Symptomatic but completely ambulatory (Restricted in physically strenuous activity but ambulatory and able to carry out work of a light or sedentary nature. For example, light housework, office work)  2 - Symptomatic, <50% in bed during the day (Ambulatory and capable of all self care but unable to carry out any work activities. Up and about more than 50% of waking hours)  3 - Symptomatic, >50% in bed, but not bedbound (Capable of only limited self-care, confined to bed or chair 50% or more of waking hours)  4 - Bedbound (Completely disabled. Cannot carry on any self-care. Totally confined to bed or chair)  5 - Death   Eustace Pen MM,  Creech RH, Tormey DC, et al. [phone]). "Toxicity and response criteria of the [facility] Group". Comfort Oncol. 5 (6): 649-55    LABORATORY DATA:  Lab Results  Component Value Date   WBC 4.0 01/29/2019   HGB 10.5 (L) 01/29/2019   HCT 32.1 (L) 01/29/2019   MCV 99.4 01/29/2019   PLT 173 01/29/2019   Lab Results  Component Value Date   NA 140 01/29/2019   K 3.4 (L) 01/29/2019   CL 102 01/29/2019   CO2 27 01/29/2019   Lab Results  Component Value Date   ALT 26 01/29/2019   AST 24 01/29/2019   ALKPHOS 79 01/29/2019   BILITOT 0.3 01/29/2019      RADIOGRAPHY: No results found.     IMPRESSION/PLAN: 1. Stage IA, pT1bN0M0 grade 3 ER positive invasive ductal carcinoma of the right breast.  Dr. Lisbeth Renshaw has reviewed the patient's case and recent course.  He would recommend a course of adjuvant radiotherapy to reduce the risks of local recurrence.  We discussed the risks, benefits, short, and long term effects of radiotherapy, and the patient is interested in proceeding. Dr. Lisbeth Renshaw recommends a course of 6 1/2 weeks of radiotherapy.  I discussed the delivery and logistics with the patient.  Given her in situ Port-A-Cath and plans for removal, we discussed that we would need about 2 weeks of time between it being removed and potentially beginning radiotherapy to allow for healing as the site is very close in proximity to the anticipated treatment field.  We have tentatively put her on our simulation schedule on 02/27/2019 and anticipate starting treatment the following week.  She will contact me if that needs to be adjusted based on the timing of her Port-A-Cath being removed. 2. Central IV access.  As above the patient is planning her Port-A-Cath to be removed in the near future.  I reached out to Dr. Brantley Stage to let him know about our anticipated treatment start dates.  This encounter was provided by telemedicine platform Mychart.  The patient has given verbal consent for this  type of encounter and has been advised to only accept a meeting of this type in a secure network environment. The time spent during this encounter  was 25 minutes. The attendants for this meeting include Charline Bills, LPN, Hayden Pedro  and Loann Quill.  During the encounter,  Charline Bills, LPN, and Hayden Pedro were located at [facility] Radiation Oncology Department.  [name] [name] was located at home.      Carola Rhine, PAC

## 2019-02-10 ENCOUNTER — Encounter: Payer: Self-pay | Admitting: *Deleted

## 2019-02-11 ENCOUNTER — Telehealth: Payer: Self-pay | Admitting: Radiation Oncology

## 2019-02-11 NOTE — Progress Notes (Signed)
[place] CSW Progress Notes  Call from patient - she does not know where to send her unpaid medical bills - informed she can submit to [facility] in [place].  She will leave these w front desk in [place] requested to scan/email to PIP.  PT also states her treatment time has been extended - no appts in chart yet.  As she can request further funds from [employer] if treatment plan is extended, CSW asked pt to inform CSW when new tx plan is in place - this can be provided to [facility] for more funding for emergent needs.  [name] Shell, LCSW Clinical Social Worker Phone:  [phone]

## 2019-02-12 ENCOUNTER — Other Ambulatory Visit: Payer: Self-pay

## 2019-02-12 ENCOUNTER — Ambulatory Visit
Admission: RE | Admit: 2019-02-12 | Discharge: 2019-02-12 | Disposition: A | Discharge status: Home / Self Care | Payer: BC Managed Care – PPO | Source: Ambulatory Visit | Attending: Radiation Oncology | Admitting: Radiation Oncology

## 2019-02-12 DIAGNOSIS — C50311 Malignant neoplasm of lower-inner quadrant of right female breast: Secondary | ICD-10-CM | POA: Diagnosis not present

## 2019-02-12 DIAGNOSIS — Z510 Encounter for antineoplastic radiation therapy: Secondary | ICD-10-CM | POA: Diagnosis not present

## 2019-02-12 DIAGNOSIS — Z170 Estrogen receptor positive status [ER+]: Secondary | ICD-10-CM | POA: Insufficient documentation

## 2019-02-12 NOTE — Telephone Encounter (Signed)
I spoke with the patient and she has been scheduled for a PAC removal but unfortunately this can't be done until early August. After talking with the patient, she would rather complete radiotherapy prior to [facility] removal since it's on the same side she is going to receive radiotherapy. Her simulation appt was moved up to tomorrow. We will begin treatment in the next week or so.

## 2019-02-14 DIAGNOSIS — C50311 Malignant neoplasm of lower-inner quadrant of right female breast: Secondary | ICD-10-CM | POA: Diagnosis not present

## 2019-02-19 ENCOUNTER — Other Ambulatory Visit: Payer: Self-pay

## 2019-02-19 ENCOUNTER — Ambulatory Visit
Admission: RE | Admit: 2019-02-19 | Discharge: 2019-02-19 | Disposition: A | Discharge status: Home / Self Care | Payer: BC Managed Care – PPO | Source: Ambulatory Visit | Attending: Radiation Oncology | Admitting: Radiation Oncology

## 2019-02-19 DIAGNOSIS — C50311 Malignant neoplasm of lower-inner quadrant of right female breast: Secondary | ICD-10-CM | POA: Diagnosis not present

## 2019-02-19 DIAGNOSIS — Z510 Encounter for antineoplastic radiation therapy: Secondary | ICD-10-CM | POA: Insufficient documentation

## 2019-02-19 DIAGNOSIS — Z170 Estrogen receptor positive status [ER+]: Secondary | ICD-10-CM | POA: Diagnosis not present

## 2019-02-20 ENCOUNTER — Ambulatory Visit
Admission: RE | Admit: 2019-02-20 | Discharge: 2019-02-20 | Disposition: A | Discharge status: Home / Self Care | Payer: BC Managed Care – PPO | Source: Ambulatory Visit | Attending: Radiation Oncology | Admitting: Radiation Oncology

## 2019-02-20 DIAGNOSIS — C50311 Malignant neoplasm of lower-inner quadrant of right female breast: Secondary | ICD-10-CM

## 2019-02-20 DIAGNOSIS — Z170 Estrogen receptor positive status [ER+]: Secondary | ICD-10-CM

## 2019-02-20 MED ORDER — RADIAPLEXRX EX GEL
Freq: Once | CUTANEOUS | Status: AC
  Administered 2019-02-20: 16:00:00 via TOPICAL

## 2019-02-20 MED ORDER — ALRA NON-METALLIC DEODORANT (RAD-ONC)
1.0000 "application " | Freq: Once | TOPICAL | Status: AC
  Administered 2019-02-20: 1 via TOPICAL

## 2019-02-20 NOTE — Progress Notes (Signed)
Pt here for patient teaching.  Pt given Radiation and You booklet, skin care instructions, Alra deodorant and Radiaplex gel.  Reviewed areas of pertinence such as fatigue, hair loss, skin changes, breast tenderness and breast swelling . Pt able to give teach back of to pat skin and use unscented/gentle soap,apply Radiaplex bid, avoid applying anything to skin within 4 hours of treatment, avoid wearing an under wire bra and to use an electric razor if they must shave. Pt verbalized understanding of information given and will contact nursing with any questions or concerns.     Orson Rho M. Shevaun Lovan RN, BSN  

## 2019-02-21 ENCOUNTER — Other Ambulatory Visit: Payer: Self-pay | Admitting: Hematology and Oncology

## 2019-02-24 ENCOUNTER — Ambulatory Visit
Admission: RE | Admit: 2019-02-24 | Discharge: 2019-02-24 | Disposition: A | Discharge status: Home / Self Care | Payer: BC Managed Care – PPO | Source: Ambulatory Visit | Attending: Radiation Oncology | Admitting: Radiation Oncology

## 2019-02-24 ENCOUNTER — Other Ambulatory Visit: Payer: Self-pay

## 2019-02-24 DIAGNOSIS — C50311 Malignant neoplasm of lower-inner quadrant of right female breast: Secondary | ICD-10-CM | POA: Diagnosis not present

## 2019-02-25 ENCOUNTER — Ambulatory Visit
Admission: RE | Admit: 2019-02-25 | Discharge: 2019-02-25 | Disposition: A | Discharge status: Home / Self Care | Payer: BC Managed Care – PPO | Source: Ambulatory Visit | Attending: Radiation Oncology | Admitting: Radiation Oncology

## 2019-02-25 ENCOUNTER — Other Ambulatory Visit: Payer: Self-pay

## 2019-02-25 DIAGNOSIS — C50311 Malignant neoplasm of lower-inner quadrant of right female breast: Secondary | ICD-10-CM | POA: Diagnosis not present

## 2019-02-26 ENCOUNTER — Telehealth: Payer: Self-pay | Admitting: Radiation Oncology

## 2019-02-26 ENCOUNTER — Ambulatory Visit
Admission: RE | Admit: 2019-02-26 | Discharge: 2019-02-26 | Disposition: A | Discharge status: Home / Self Care | Payer: BC Managed Care – PPO | Source: Ambulatory Visit | Attending: Radiation Oncology | Admitting: Radiation Oncology

## 2019-02-26 ENCOUNTER — Other Ambulatory Visit: Payer: Self-pay | Admitting: Radiation Oncology

## 2019-02-26 ENCOUNTER — Other Ambulatory Visit: Payer: Self-pay

## 2019-02-26 DIAGNOSIS — Z170 Estrogen receptor positive status [ER+]: Secondary | ICD-10-CM

## 2019-02-26 DIAGNOSIS — C50311 Malignant neoplasm of lower-inner quadrant of right female breast: Secondary | ICD-10-CM

## 2019-02-26 NOTE — Telephone Encounter (Signed)
Pt called with complaints of shoulder blade pain. I called her back and left a message asking her to touch base with me again by phone or to discuss with treatment staff so we can further evaluate. I suspect her symptoms may be due to postoperative tightening of the shoulder area and she may benefit from PT. We will make decisions when she returns my call or this afternoon following treatment.

## 2019-02-27 ENCOUNTER — Other Ambulatory Visit: Payer: Self-pay

## 2019-02-27 ENCOUNTER — Ambulatory Visit
Admission: RE | Admit: 2019-02-27 | Discharge: 2019-02-27 | Disposition: A | Discharge status: Home / Self Care | Payer: BC Managed Care – PPO | Source: Ambulatory Visit | Attending: Radiation Oncology | Admitting: Radiation Oncology

## 2019-02-27 ENCOUNTER — Encounter: Payer: Self-pay | Admitting: General Practice

## 2019-02-27 ENCOUNTER — Encounter: Payer: Self-pay | Admitting: Physical Therapy

## 2019-02-27 ENCOUNTER — Ambulatory Visit: Payer: BC Managed Care – PPO | Attending: Radiation Oncology | Admitting: Physical Therapy

## 2019-02-27 DIAGNOSIS — C50311 Malignant neoplasm of lower-inner quadrant of right female breast: Secondary | ICD-10-CM | POA: Insufficient documentation

## 2019-02-27 DIAGNOSIS — M25611 Stiffness of right shoulder, not elsewhere classified: Secondary | ICD-10-CM | POA: Diagnosis not present

## 2019-02-27 DIAGNOSIS — M25512 Pain in left shoulder: Secondary | ICD-10-CM | POA: Insufficient documentation

## 2019-02-27 DIAGNOSIS — M25511 Pain in right shoulder: Secondary | ICD-10-CM | POA: Diagnosis present

## 2019-02-27 DIAGNOSIS — R600 Localized edema: Secondary | ICD-10-CM | POA: Diagnosis present

## 2019-02-27 DIAGNOSIS — M6281 Muscle weakness (generalized): Secondary | ICD-10-CM | POA: Insufficient documentation

## 2019-02-27 DIAGNOSIS — Z170 Estrogen receptor positive status [ER+]: Secondary | ICD-10-CM | POA: Diagnosis present

## 2019-02-27 DIAGNOSIS — M25612 Stiffness of left shoulder, not elsewhere classified: Secondary | ICD-10-CM | POA: Diagnosis not present

## 2019-02-27 DIAGNOSIS — Z483 Aftercare following surgery for neoplasm: Secondary | ICD-10-CM | POA: Diagnosis present

## 2019-02-27 DIAGNOSIS — R293 Abnormal posture: Secondary | ICD-10-CM | POA: Insufficient documentation

## 2019-02-27 NOTE — Progress Notes (Signed)
[place] CSW Progress Notes  Patient provided copy of unpaid medical bill from [facility] - sent by secure email to [facility] in [place] for payment.  Edwyna Shell, LCSW Clinical Social Worker Phone:  [phone]

## 2019-02-27 NOTE — Therapy (Signed)
[place], [place], [address] Phone: [phone]   Fax:  [phone]  Physical Therapy Evaluation  Patient Details  Name: [name] [name] MRN: [id number] Date of Birth: [birth year] Referring Provider (PT): Dara Lords   Encounter Date: 02/27/2019  PT End of Session - 02/27/19 1223    Visit Number  1    Number of Visits  9    Date for PT Re-Evaluation  03/27/19    PT Start Time  1133    PT Stop Time  1210    PT Time Calculation (min)  37 min    Activity Tolerance  Patient tolerated treatment well    Behavior During Therapy  [facility] for tasks assessed/performed       Past Medical History:  Diagnosis Date  . Allergy    seasonal  . Anal fissure   . Complication of anesthesia   . Diverticulitis   . GERD (gastroesophageal reflux disease) 07/07/2005  . Hemorrhoid   . Hepatic steatosis   . Hypertension   . Internal hemorrhoids   . Malignant neoplasm of lower-inner quadrant of right breast of female, estrogen receptor positive ([place]) 07/16/2018  . PONV (postoperative nausea and vomiting)   . Positive H. pylori test     Past Surgical History:  Procedure Laterality Date  . BREAST LUMPECTOMY WITH RADIOACTIVE SEED AND SENTINEL LYMPH NODE BIOPSY Right 08/23/2018   Procedure: RIGHT BREAST LUMPECTOMY WITH RADIOACTIVE SEED AND RIGHT SENTINEL LYMPH NODE MAPPING;  Surgeon: Erroll Luna, MD;  Location: [place];  Service: General;  Laterality: Right;  . COLON RESECTION  2000's Dr. Hassell Done   Diverticulitis.  . COLONOSCOPY    . FOOT SURGERY Left   . g3p2 c-section1    . KNEE SURGERY Right   . PORTACATH PLACEMENT Right 09/24/2018   Procedure: INSERTION PORT-A-CATH WITH ULTRASOUND;  Surgeon: Erroll Luna, MD;  Location: [place];  Service: General;  Laterality: Right;  . TONSILLECTOMY    . TUBAL LIGATION      There were no vitals filed for this visit.   Subjective Assessment - 02/27/19 1136     Subjective  I am having swelling under my arm and it is causing pain all the way in to my shoulder. It is larger on that side. My arms get tired when I have to lift them.    Pertinent History  Patient was diagnosed on 07/13/18 with right grade III invasive ductal carcinoma breast cancer. It measures 6 mm, is ER positive and PR negative, HER2 negative with a Ki67 of 50%. , 08/23/18- R lumpectomy and SLNB 0/3 negative, pt has completed chemo and is currently undergoing radiation    Patient Stated Goals  to get the fluid out and decrease pain    Currently in Pain?  Yes    Pain Score  7     Pain Location  --   trunk   Pain Orientation  Right    Pain Descriptors / Indicators  Sore;Tender;Aching;Burning    Pain Type  Surgical pain    Pain Onset  More than a month ago    Pain Frequency  Constant    Aggravating Factors   nothing    Pain Relieving Factors  nothing    Effect of Pain on Daily Activities  difficult to raise arm         [facility] PT Assessment - 02/27/19 0001      Assessment   Medical Diagnosis  right breast cancer  Referring Provider (PT)  Dara Lords    Onset Date/Surgical Date  08/23/18    Hand Dominance  Right    Prior Therapy  none      Precautions   Precautions  Other (comment)    Precaution Comments  at risk for lymphedema      Restrictions   Weight Bearing Restrictions  No      Balance Screen   Has the patient fallen in the past 6 months  No    Has the patient had a decrease in activity level because of a fear of falling?   No    Is the patient reluctant to leave their home because of a fear of falling?   No      Home Environment   Living Environment  Private residence    Living Arrangements  Alone    Available Help at Discharge  Family    Type of [place]      Prior Function   Level of [place]  Part time employment    Health and safety inspector     Leisure  She does not exercise      Cognition   Overall Cognitive  Status  Within Functional Limits for tasks assessed      Observation/Other Assessments   Observations  fullness under right lumpectomy scar very tender to touch, entire R upper quadrant tender to touch with numerous trigger points      Posture/Postural Control   Posture/Postural Control  Postural limitations    Postural Limitations  Rounded Shoulders;Forward head      AROM   Right Shoulder Extension  --    Right Shoulder Flexion  140 Degrees    Right Shoulder ABduction  165 Degrees    Right Shoulder Internal Rotation  60 Degrees    Right Shoulder External Rotation  78 Degrees    Left Shoulder Extension  --    Left Shoulder Flexion  160 Degrees    Left Shoulder ABduction  158 Degrees    Left Shoulder Internal Rotation  52 Degrees    Left Shoulder External Rotation  82 Degrees    Cervical Flexion  --    Cervical Extension  --    Cervical - Right Side Bend  --    Cervical - Left Side Bend  --    Cervical - Right Rotation  --    Cervical - Left Rotation  --      Strength   Overall Strength  --        LYMPHEDEMA/ONCOLOGY QUESTIONNAIRE - 02/27/19 1149      Type   Cancer Type  right breast cancer  (Pended)       Surgeries   Mastectomy Date  08/23/18  (Pended)     Sentinel Lymph Node Biopsy Date  08/23/18  (Pended)     Number Lymph Nodes Removed  3  (Pended)       Treatment   Active Chemotherapy Treatment  No  (Pended)     Past Chemotherapy Treatment  Yes  (Pended)     Active Radiation Treatment  Yes  (Pended)     Past Radiation Treatment  No  (Pended)     Current Hormone Treatment  No  (Pended)    will start after she is done with radiation   Past Hormone Therapy  No  (Pended)       What other symptoms do you have   Are you Having  Heaviness or Tightness  Yes  (Pended)     Are you having Pain  Yes  (Pended)     Are you having pitting edema  No  (Pended)     Is it Hard or Difficult finding clothes that fit  No  (Pended)     Do you have infections  No  (Pended)     Is  there Decreased scar mobility  Yes  (Pended)       Lymphedema Assessments   Lymphedema Assessments  Upper extremities  (Pended)       Right Upper Extremity Lymphedema   10 cm Proximal to Olecranon Process  34.5 cm  (Pended)     Olecranon Process  27.7 cm  (Pended)     10 cm Proximal to Ulnar Styloid Process  23.6 cm  (Pended)     Just Proximal to Ulnar Styloid Process  18.1 cm  (Pended)     Across Hand at [employer]  21 cm  (Pended)     At [place] of 2nd Digit  7.2 cm  (Pended)       Left Upper Extremity Lymphedema   10 cm Proximal to Olecranon Process  36 cm  (Pended)     Olecranon Process  29 cm  (Pended)     10 cm Proximal to Ulnar Styloid Process  21.5 cm  (Pended)     Just Proximal to Ulnar Styloid Process  18 cm  (Pended)     Across Hand at [employer]  21.5 cm  (Pended)     At [place] of 2nd Digit  6.7 cm  (Pended)           Quick Dash - 02/27/19 0001    Open a tight or new jar  No difficulty    Do heavy household chores (wash walls, wash floors)  Moderate difficulty    Carry a shopping bag or briefcase  Moderate difficulty    Wash your back  Severe difficulty    Use a knife to cut food  No difficulty    Recreational activities in which you take some force or impact through your arm, shoulder, or hand (golf, hammering, tennis)  Unable    During the past week, to what extent has your arm, shoulder or hand problem interfered with your normal social activities with family, friends, neighbors, or groups?  Quite a bit    During the past week, to what extent has your arm, shoulder or hand problem limited your work or other regular daily activities  Slightly    Arm, shoulder, or hand pain.  Extreme    Tingling (pins and needles) in your arm, shoulder, or hand  Severe    Difficulty Sleeping  Severe difficulty    DASH Score  56.82 %        Objective measurements completed on examination: See above findings.      [place] Adult PT Treatment/Exercise - 02/27/19 0001       Manual Therapy   Manual Therapy  Soft tissue mobilization    Soft tissue mobilization  in supine with pt's arm behind head workng very gently over right lumpectomy scar in area of increased sensitivity. At beginning of session pt could barely tolerate any pressure and by the end she was able to tolerate slightly more pressure and reported decreased sensitivity. Encouraged pt to try this at home             PT Education - 02/27/19 1217  Education Details  supine dowel flexion, anatomy and physiology of lymphatic system, lymphedema risk reduction practices    Person(s) Educated  Patient    Methods  Explanation;Handout    Comprehension  Verbalized understanding          PT Long Term Goals - 02/27/19 1218      PT LONG TERM GOAL #1   Title  Pt will report a 75% improvement in pain around right lumpectomy scar and upper quadrant    Time  4    Period  Weeks    Status  New    Target Date  03/27/19      PT LONG TERM GOAL #2   Title  Pt will demonstate 160 degrees of right shoulder flexion to allow her to reach items on top shelf.    Baseline  140    Time  4    Period  Weeks    Status  New    Target Date  03/27/19      PT LONG TERM GOAL #3   Title  Pt will report a 75% improvement in feeling of fatigue in R UE with RUE movements to allow her to return to prior level of function    Time  4    Period  Weeks    Status  New    Target Date  03/27/19      PT LONG TERM GOAL #4   Title  Pt will be independent in a home exercise program for continued strengthening and stretching    Time  4    Period  Weeks    Status  New    Target Date  03/27/19             Plan - 02/27/19 1224    Clinical Impression Statement  Patient presents to PT with increased right upper quadrant pain and sensitivity and well as increased muscle fatigue with any R UE movement. Pt underwent a right lumpectomy and SLNB on 08/23/18 for treatment of right breast cancer. She has completed chemo and is  currently undergoing radiation. She is very tender to the touch throughout right upper quadrant but especially around her lumpectomy scar. Began the process of desensitization today with gentle soft tissue mobilization. Pt would benefit from skilled PT services to increase right shoulder ROM, decrease pain and swelling in area of right upper quadrant and instruct pt in a home exercise program.    Personal Factors and Comorbidities  Time since onset of injury/illness/exacerbation;Other   lives alone   Examination-Activity Limitations  Bathing;Reach Overhead;Carry    Examination-Participation Restrictions  Meal Prep;Cleaning    Stability/Clinical Decision Making  Evolving/Moderate complexity    Clinical Decision Making  Moderate    Rehab Potential  Good    Clinical Impairments Affecting Rehab Potential  pt undergoing radiation    PT Frequency  2x / week    PT Duration  4 weeks    PT Treatment/Interventions  ADLs/Self Care Home Management;Therapeutic exercise;Patient/family education;Therapeutic activities;Manual techniques;Manual lymph drainage;Scar mobilization;Passive range of motion;Taping    PT Next Visit Plan  continue STM for desensitization around R lumpectomy scar, STM to R upper quadrant, MLD to seroma in area of R axilla, supine scap exercises    PT Home Exercise Plan  supine dowel flexion    Consulted and Agree with Plan of Care  Patient       Patient will benefit from skilled therapeutic intervention in order to improve the following deficits and impairments:  Postural  dysfunction, Decreased range of motion, Impaired UE functional use, Pain, Decreased knowledge of precautions, Decreased scar mobility, Increased edema, Increased fascial restricitons  Visit Diagnosis: 1. Stiffness of left shoulder, not elsewhere classified   2. Aftercare following surgery for neoplasm   3. Acute pain of left shoulder   4. Muscle weakness (generalized)        Problem List Patient Active Problem  List   Diagnosis Date Noted  . Port-A-Cath in place 09/25/2018  . Malignant neoplasm of lower-inner quadrant of right breast of female, estrogen receptor positive ([place]) 07/16/2018  . Special screening for malignant neoplasms, colon 07/10/2017  . Anal fissure 07/07/2014  . Pruritic rash 06/16/2014  . Obesity 06/16/2014  . Primary localized osteoarthrosis, lower leg 11/18/2013  . Bilateral knee pain 09/08/2013  . VITAMIN B12 DEFICIENCY 02/10/2010  . Essential hypertension 08/16/2007  . ALLERGIC RHINITIS 08/16/2007  . GERD 08/16/2007  . Right upper quadrant pain 05/13/2007    Allyson Sabal [facility] 02/27/2019, 12:30 PM  [place], [place], [address] Phone: [phone]   Fax:  [phone]  Name: [name] [name] MRN: [id number] Date of Birth: 1965/12/26  Manus Gunning, PT 02/27/19 12:30 PM

## 2019-02-28 ENCOUNTER — Other Ambulatory Visit: Payer: Self-pay

## 2019-02-28 ENCOUNTER — Telehealth: Payer: Self-pay | Admitting: Hematology and Oncology

## 2019-02-28 ENCOUNTER — Ambulatory Visit
Admission: RE | Admit: 2019-02-28 | Discharge: 2019-02-28 | Disposition: A | Discharge status: Home / Self Care | Payer: BC Managed Care – PPO | Source: Ambulatory Visit | Attending: Radiation Oncology | Admitting: Radiation Oncology

## 2019-02-28 DIAGNOSIS — C50311 Malignant neoplasm of lower-inner quadrant of right female breast: Secondary | ICD-10-CM | POA: Diagnosis not present

## 2019-02-28 NOTE — Telephone Encounter (Signed)
Scheduled appt per 7/09 sch message- unable to reach pt . Left message with appt date and time   

## 2019-03-03 ENCOUNTER — Ambulatory Visit: Payer: BC Managed Care – PPO | Admitting: Physical Therapy

## 2019-03-03 ENCOUNTER — Other Ambulatory Visit: Payer: Self-pay

## 2019-03-03 ENCOUNTER — Encounter: Payer: Self-pay | Admitting: Physical Therapy

## 2019-03-03 ENCOUNTER — Ambulatory Visit
Admission: RE | Admit: 2019-03-03 | Discharge: 2019-03-03 | Disposition: A | Discharge status: Home / Self Care | Payer: BC Managed Care – PPO | Source: Ambulatory Visit | Attending: Radiation Oncology | Admitting: Radiation Oncology

## 2019-03-03 DIAGNOSIS — R600 Localized edema: Secondary | ICD-10-CM

## 2019-03-03 DIAGNOSIS — M25611 Stiffness of right shoulder, not elsewhere classified: Secondary | ICD-10-CM | POA: Diagnosis not present

## 2019-03-03 DIAGNOSIS — M6281 Muscle weakness (generalized): Secondary | ICD-10-CM

## 2019-03-03 DIAGNOSIS — C50311 Malignant neoplasm of lower-inner quadrant of right female breast: Secondary | ICD-10-CM

## 2019-03-03 DIAGNOSIS — Z483 Aftercare following surgery for neoplasm: Secondary | ICD-10-CM

## 2019-03-03 DIAGNOSIS — R293 Abnormal posture: Secondary | ICD-10-CM

## 2019-03-03 DIAGNOSIS — Z170 Estrogen receptor positive status [ER+]: Secondary | ICD-10-CM

## 2019-03-03 NOTE — Therapy (Signed)
[place], [place], [address] Phone: [phone]   Fax:  [phone]  Physical Therapy Treatment  Patient Details  Name: [name] [name] MRN: [id number] Date of Birth: 10-Nov-1965 Referring Provider (PT): Dara Lords   Encounter Date: 03/03/2019  PT End of Session - 03/03/19 1428    Visit Number  2    Number of Visits  9    Date for PT Re-Evaluation  03/27/19    PT Start Time  1233    PT Stop Time  1322    PT Time Calculation (min)  49 min    Activity Tolerance  Patient tolerated treatment well    Behavior During Therapy  [facility] for tasks assessed/performed       Past Medical History:  Diagnosis Date  . Allergy    seasonal  . Anal fissure   . Complication of anesthesia   . Diverticulitis   . GERD (gastroesophageal reflux disease) 07/07/2005  . Hemorrhoid   . Hepatic steatosis   . Hypertension   . Internal hemorrhoids   . Malignant neoplasm of lower-inner quadrant of right breast of female, estrogen receptor positive ([place]) 07/16/2018  . PONV (postoperative nausea and vomiting)   . Positive H. pylori test     Past Surgical History:  Procedure Laterality Date  . BREAST LUMPECTOMY WITH RADIOACTIVE SEED AND SENTINEL LYMPH NODE BIOPSY Right 08/23/2018   Procedure: RIGHT BREAST LUMPECTOMY WITH RADIOACTIVE SEED AND RIGHT SENTINEL LYMPH NODE MAPPING;  Surgeon: Erroll Luna, MD;  Location: [place];  Service: General;  Laterality: Right;  . COLON RESECTION  2000's Dr. Hassell Done   Diverticulitis.  . COLONOSCOPY    . FOOT SURGERY Left   . g3p2 c-section1    . KNEE SURGERY Right   . PORTACATH PLACEMENT Right 09/24/2018   Procedure: INSERTION PORT-A-CATH WITH ULTRASOUND;  Surgeon: Erroll Luna, MD;  Location: [place];  Service: General;  Laterality: Right;  . TONSILLECTOMY    . TUBAL LIGATION      There were no vitals filed for this visit.  Subjective Assessment - 03/03/19 1233     Subjective  Today I feel a little better than last week. The compression she gave me helped some.    Pertinent History  Patient was diagnosed on 07/13/18 with right grade III invasive ductal carcinoma breast cancer. It measures 6 mm, is ER positive and PR negative, HER2 negative with a Ki67 of 50%. , 08/23/18- R lumpectomy and SLNB 0/3 negative, pt has completed chemo and is currently undergoing radiation    Patient Stated Goals  to get the fluid out and decrease pain    Currently in Pain?  Yes    Pain Score  5     Pain Location  --   Right lateral trunk under axilla   Pain Orientation  Right    Pain Descriptors / Indicators  Burning;Throbbing;Nagging    Pain Type  Neuropathic pain    Pain Onset  More than a month ago    Pain Frequency  Intermittent    Multiple Pain Sites  No                       [facility] Adult PT Treatment/Exercise - 03/03/19 0001      Manual Therapy   Manual Therapy  Manual Lymphatic Drainage (MLD);Passive ROM    Manual therapy comments  Applied large wide 1/2" compression faom in stockinette to right lateral trunk between bra  and skin to reduce lateral and posterior upper back edema.    Manual Lymphatic Drainage (MLD)  Pt used large towel to stay covered as much as possible during treatment. Manual lymph drainage to address right lateral trunk and posterior upper back edema: in supine - short neck, right inguinal and left axillary nodes, right axillo-inguinal and anterior inter-axillary pathway; in left sidelying positoned with several pillows - focused on right axillo-inguinal pathway a bit more lateral and then posterior inter-axillary pathway.    Passive ROM  PROM right shoulder in supine: flexion, abduction, IR and ER to pt tolerance with gentle neural stretching.             PT Education - 03/03/19 1427    Education Details  Hand gripping with washcloth to improve grip strength - encouraged her to squeeze 20x, 3x/day.    Person(s) Educated   Patient    Methods  Explanation;Demonstration    Comprehension  Verbalized understanding          PT Long Term Goals - 02/27/19 1218      PT LONG TERM GOAL #1   Title  Pt will report a 75% improvement in pain around right lumpectomy scar and upper quadrant    Time  4    Period  Weeks    Status  New    Target Date  03/27/19      PT LONG TERM GOAL #2   Title  Pt will demonstate 160 degrees of right shoulder flexion to allow her to reach items on top shelf.    Baseline  140    Time  4    Period  Weeks    Status  New    Target Date  03/27/19      PT LONG TERM GOAL #3   Title  Pt will report a 75% improvement in feeling of fatigue in R UE with RUE movements to allow her to return to prior level of function    Time  4    Period  Weeks    Status  New    Target Date  03/27/19      PT LONG TERM GOAL #4   Title  Pt will be independent in a home exercise program for continued strengthening and stretching    Time  4    Period  Weeks    Status  New    Target Date  03/27/19            Plan - 03/03/19 1429    Clinical Impression Statement  Patient reported feeling significantly less pain and more relaxed by the end of treatment. No c/o pain during manual therapy. Her ROM is near normal with some tightness at end ROM. It appears her edema is likely the cause of her pain so continuing manual lymph drainage will be helpful.    Rehab Potential  Good    PT Frequency  2x / week    PT Duration  4 weeks    PT Treatment/Interventions  ADLs/Self Care Home Management;Therapeutic exercise;Patient/family education;Therapeutic activities;Manual techniques;Manual lymph drainage;Scar mobilization;Passive range of motion;Taping    PT Next Visit Plan  Manual lymph drainage to focus on right lateral proximal trunk under axilla; assess response to wider compression foam. Begin gentle right UE strength program.    PT Home Exercise Plan  gripping washcloth    Consulted and Agree with Plan of Care   Patient       Patient will benefit from skilled therapeutic intervention  in order to improve the following deficits and impairments:  Postural dysfunction, Decreased range of motion, Impaired UE functional use, Pain, Decreased knowledge of precautions, Decreased scar mobility, Increased edema, Increased fascial restricitons  Visit Diagnosis: 1. Aftercare following surgery for neoplasm   2. Muscle weakness (generalized)   3. Malignant neoplasm of lower-inner quadrant of right breast of female, estrogen receptor positive ([place])   4. Abnormal posture   5. Localized edema   6. Stiffness of right shoulder, not elsewhere classified        Problem List Patient Active Problem List   Diagnosis Date Noted  . Port-A-Cath in place 09/25/2018  . Malignant neoplasm of lower-inner quadrant of right breast of female, estrogen receptor positive ([place]) 07/16/2018  . Special screening for malignant neoplasms, colon 07/10/2017  . Anal fissure 07/07/2014  . Pruritic rash 06/16/2014  . Obesity 06/16/2014  . Primary localized osteoarthrosis, lower leg 11/18/2013  . Bilateral knee pain 09/08/2013  . VITAMIN B12 DEFICIENCY 02/10/2010  . Essential hypertension 08/16/2007  . ALLERGIC RHINITIS 08/16/2007  . GERD 08/16/2007  . Right upper quadrant pain 05/13/2007   Annia Friendly, PT 03/03/19 2:52 PM  [place], [place], [address] Phone: [phone]   Fax:  [phone]  Name: [name] [name] MRN: [id number] Date of Birth: [birth year]

## 2019-03-03 NOTE — Addendum Note (Signed)
Addended by: Manus Gunning L on: 03/03/2019 04:34 PM   Modules accepted: Orders

## 2019-03-04 ENCOUNTER — Other Ambulatory Visit: Payer: Self-pay

## 2019-03-04 ENCOUNTER — Telehealth: Payer: Self-pay | Admitting: Hematology and Oncology

## 2019-03-04 ENCOUNTER — Ambulatory Visit
Admission: RE | Admit: 2019-03-04 | Discharge: 2019-03-04 | Disposition: A | Discharge status: Home / Self Care | Payer: BC Managed Care – PPO | Source: Ambulatory Visit | Attending: Radiation Oncology | Admitting: Radiation Oncology

## 2019-03-04 DIAGNOSIS — C50311 Malignant neoplasm of lower-inner quadrant of right female breast: Secondary | ICD-10-CM | POA: Diagnosis not present

## 2019-03-04 NOTE — Telephone Encounter (Signed)
Scheduled appt per 7/14 sch message - pt aware of appt date and time

## 2019-03-05 ENCOUNTER — Ambulatory Visit: Payer: BC Managed Care – PPO

## 2019-03-05 ENCOUNTER — Ambulatory Visit
Admission: RE | Admit: 2019-03-05 | Discharge: 2019-03-05 | Disposition: A | Discharge status: Home / Self Care | Payer: BC Managed Care – PPO | Source: Ambulatory Visit | Attending: Radiation Oncology | Admitting: Radiation Oncology

## 2019-03-05 ENCOUNTER — Other Ambulatory Visit: Payer: Self-pay

## 2019-03-05 DIAGNOSIS — M25511 Pain in right shoulder: Secondary | ICD-10-CM

## 2019-03-05 DIAGNOSIS — R600 Localized edema: Secondary | ICD-10-CM

## 2019-03-05 DIAGNOSIS — C50311 Malignant neoplasm of lower-inner quadrant of right female breast: Secondary | ICD-10-CM | POA: Diagnosis not present

## 2019-03-05 DIAGNOSIS — M6281 Muscle weakness (generalized): Secondary | ICD-10-CM

## 2019-03-05 DIAGNOSIS — M25611 Stiffness of right shoulder, not elsewhere classified: Secondary | ICD-10-CM

## 2019-03-05 DIAGNOSIS — Z483 Aftercare following surgery for neoplasm: Secondary | ICD-10-CM

## 2019-03-05 NOTE — Therapy (Signed)
[place], [place], [address] Phone: [phone]   Fax:  [phone]  Physical Therapy Treatment  Patient Details  Name: [name] [name] MRN: [id number] Date of Birth: May 25, 1966 Referring Provider (PT): Dara Lords   Encounter Date: 03/05/2019  PT End of Session - 03/05/19 1157    Visit Number  3    Number of Visits  9    Date for PT Re-Evaluation  03/27/19    PT Start Time  1104    PT Stop Time  1152    PT Time Calculation (min)  48 min    Activity Tolerance  Patient tolerated treatment well    Behavior During Therapy  [facility] for tasks assessed/performed       Past Medical History:  Diagnosis Date  . Allergy    seasonal  . Anal fissure   . Complication of anesthesia   . Diverticulitis   . GERD (gastroesophageal reflux disease) 07/07/2005  . Hemorrhoid   . Hepatic steatosis   . Hypertension   . Internal hemorrhoids   . Malignant neoplasm of lower-inner quadrant of right breast of female, estrogen receptor positive ([place]) 07/16/2018  . PONV (postoperative nausea and vomiting)   . Positive H. pylori test     Past Surgical History:  Procedure Laterality Date  . BREAST LUMPECTOMY WITH RADIOACTIVE SEED AND SENTINEL LYMPH NODE BIOPSY Right 08/23/2018   Procedure: RIGHT BREAST LUMPECTOMY WITH RADIOACTIVE SEED AND RIGHT SENTINEL LYMPH NODE MAPPING;  Surgeon: Erroll Luna, MD;  Location: [facility];  Service: General;  Laterality: Right;  . COLON RESECTION  2000's Dr. Hassell Done   Diverticulitis.  . COLONOSCOPY    . FOOT SURGERY Left   . g3p2 c-section1    . KNEE SURGERY Right   . PORTACATH PLACEMENT Right 09/24/2018   Procedure: INSERTION PORT-A-CATH WITH ULTRASOUND;  Surgeon: Erroll Luna, MD;  Location: [facility];  Service: General;  Laterality: Right;  . TONSILLECTOMY    . TUBAL LIGATION      There were no vitals filed for this visit.  Subjective Assessment - 03/05/19 1105     Subjective  Felt better after last sessionand the foam helped.    Pertinent History  Patient was diagnosed on 07/13/18 with right grade III invasive ductal carcinoma breast cancer. It measures 6 mm, is ER positive and PR negative, HER2 negative with a Ki67 of 50%. , 08/23/18- R lumpectomy and SLNB 0/3 negative, pt has completed chemo and is currently undergoing radiation    Patient Stated Goals  to get the fluid out and decrease pain    Currently in Pain?  Yes    Pain Score  4     Pain Location  --   Rt lateral trunk under axilla   Pain Orientation  Right    Pain Descriptors / Indicators  Aching    Pain Type  Neuropathic pain    Pain Onset  More than a month ago    Pain Frequency  Intermittent    Aggravating Factors   nothing    Pain Relieving Factors  compression foam helped                       [facility] Adult PT Treatment/Exercise - 03/05/19 0001      Shoulder Exercises: Isometric Strengthening   Flexion  5X5"   in doorway for all with Rt UE   Extension  5X5"    External Rotation  5X5"  ABduction  5X5"      Manual Therapy   Soft tissue mobilization  to lateral >medial border of Rt scapula when in Lt S/L. Pt able to tolerate moderate pressure without c/o sensitivity, she reports this feeling much improved overall    Manual Lymphatic Drainage (MLD)  Pt used large towel to stay covered as much as possible during treatment. Manual lymph drainage to address right lateral trunk and posterior upper back edema: in supine - short neck, right inguinal and left axillary nodes, right axillo-inguinal and anterior inter-axillary pathway; in left S/L focused on right axillo-inguinal pathway a bit more lateral and then posterior inter-axillary pathway.    Passive ROM  PROM right shoulder in supine: flexion, abduction, IR and ER to pt tolerance with gentle neural stretching, near full P/ROM by end of session             PT Education - 03/05/19 1112    Education Details   Standing isometrics in doorway    Person(s) Educated  Patient    Methods  Explanation;Demonstration;Handout    Comprehension  Verbalized understanding          PT Long Term Goals - 02/27/19 1218      PT LONG TERM GOAL #1   Title  Pt will report a 75% improvement in pain around right lumpectomy scar and upper quadrant    Time  4    Period  Weeks    Status  New    Target Date  03/27/19      PT LONG TERM GOAL #2   Title  Pt will demonstate 160 degrees of right shoulder flexion to allow her to reach items on top shelf.    Baseline  140    Time  4    Period  Weeks    Status  New    Target Date  03/27/19      PT LONG TERM GOAL #3   Title  Pt will report a 75% improvement in feeling of fatigue in R UE with RUE movements to allow her to return to prior level of function    Time  4    Period  Weeks    Status  New    Target Date  03/27/19      PT LONG TERM GOAL #4   Title  Pt will be independent in a home exercise program for continued strengthening and stretching    Time  4    Period  Weeks    Status  New    Target Date  03/27/19            Plan - 03/05/19 1157    Clinical Impression Statement  Pt seeming much improved from first session per PT's note. She was able to tolerate all mnaul therapy without c/o sensitivity, including soft tissue to periscapula area with mod pressure. Progressed HEP to include standing isometrics. Pt reported feeling msucels being challenged by these exercises. VCs initially for correct technique but then was able to return this.    Personal Factors and Comorbidities  Time since onset of injury/illness/exacerbation;Other   lives alone   Examination-Activity Limitations  Bathing;Reach Overhead;Carry    Examination-Participation Restrictions  Meal Prep;Cleaning    Stability/Clinical Decision Making  Evolving/Moderate complexity    Rehab Potential  Good    Clinical Impairments Affecting Rehab Potential  pt undergoing radiation    PT Frequency   2x / week    PT Duration  4 weeks  PT Treatment/Interventions  ADLs/Self Care Home Management;Therapeutic exercise;Patient/family education;Therapeutic activities;Manual techniques;Manual lymph drainage;Scar mobilization;Passive range of motion;Taping    PT Next Visit Plan  Manual lymph drainage to focus on right lateral proximal trunk under axilla; cont gentle right UE strength program reviewing HEP issued today prn; progess to supine scapula series in next 1-2 sessions    PT Home Exercise Plan  gripping washcloth; standing Rt shoulder isometrics    Consulted and Agree with Plan of Care  Patient       Patient will benefit from skilled therapeutic intervention in order to improve the following deficits and impairments:  Postural dysfunction, Decreased range of motion, Impaired UE functional use, Pain, Decreased knowledge of precautions, Decreased scar mobility, Increased edema, Increased fascial restricitons  Visit Diagnosis: 1. Stiffness of right shoulder joint   2. Aftercare following surgery for neoplasm   3. Acute pain of right shoulder   4. Muscle weakness (generalized)   5. Localized edema        Problem List Patient Active Problem List   Diagnosis Date Noted  . Port-A-Cath in place 09/25/2018  . Malignant neoplasm of lower-inner quadrant of right breast of female, estrogen receptor positive ([place]) 07/16/2018  . Special screening for malignant neoplasms, colon 07/10/2017  . Anal fissure 07/07/2014  . Pruritic rash 06/16/2014  . Obesity 06/16/2014  . Primary localized osteoarthrosis, lower leg 11/18/2013  . Bilateral knee pain 09/08/2013  . VITAMIN B12 DEFICIENCY 02/10/2010  . Essential hypertension 08/16/2007  . ALLERGIC RHINITIS 08/16/2007  . GERD 08/16/2007  . Right upper quadrant pain 05/13/2007    Otelia Limes, PTA 03/05/2019, 12:04 PM  [place], [place], [address] Phone:  [phone]   Fax:  [phone]  Name: Larna Capelle MRN: [id number] Date of Birth: [birth year]

## 2019-03-05 NOTE — Patient Instructions (Signed)

## 2019-03-06 ENCOUNTER — Ambulatory Visit
Admission: RE | Admit: 2019-03-06 | Discharge: 2019-03-06 | Disposition: A | Discharge status: Home / Self Care | Payer: BC Managed Care – PPO | Source: Ambulatory Visit | Attending: Radiation Oncology | Admitting: Radiation Oncology

## 2019-03-06 ENCOUNTER — Other Ambulatory Visit: Payer: Self-pay

## 2019-03-06 DIAGNOSIS — C50311 Malignant neoplasm of lower-inner quadrant of right female breast: Secondary | ICD-10-CM | POA: Diagnosis not present

## 2019-03-07 ENCOUNTER — Other Ambulatory Visit: Payer: Self-pay

## 2019-03-07 ENCOUNTER — Encounter

## 2019-03-07 ENCOUNTER — Ambulatory Visit
Admission: RE | Admit: 2019-03-07 | Discharge: 2019-03-07 | Disposition: A | Discharge status: Home / Self Care | Payer: BC Managed Care – PPO | Source: Ambulatory Visit | Attending: Radiation Oncology | Admitting: Radiation Oncology

## 2019-03-07 DIAGNOSIS — C50311 Malignant neoplasm of lower-inner quadrant of right female breast: Secondary | ICD-10-CM | POA: Diagnosis not present

## 2019-03-10 ENCOUNTER — Ambulatory Visit
Admission: RE | Admit: 2019-03-10 | Discharge: 2019-03-10 | Disposition: A | Discharge status: Home / Self Care | Payer: BC Managed Care – PPO | Source: Ambulatory Visit | Attending: Radiation Oncology | Admitting: Radiation Oncology

## 2019-03-10 ENCOUNTER — Ambulatory Visit: Payer: BC Managed Care – PPO

## 2019-03-10 ENCOUNTER — Other Ambulatory Visit: Payer: Self-pay

## 2019-03-10 DIAGNOSIS — M25511 Pain in right shoulder: Secondary | ICD-10-CM

## 2019-03-10 DIAGNOSIS — M6281 Muscle weakness (generalized): Secondary | ICD-10-CM

## 2019-03-10 DIAGNOSIS — C50311 Malignant neoplasm of lower-inner quadrant of right female breast: Secondary | ICD-10-CM | POA: Diagnosis not present

## 2019-03-10 DIAGNOSIS — R600 Localized edema: Secondary | ICD-10-CM

## 2019-03-10 DIAGNOSIS — Z483 Aftercare following surgery for neoplasm: Secondary | ICD-10-CM

## 2019-03-10 DIAGNOSIS — M25611 Stiffness of right shoulder, not elsewhere classified: Secondary | ICD-10-CM | POA: Diagnosis not present

## 2019-03-10 NOTE — Therapy (Signed)
[place], [place], [address] Phone: [phone]   Fax:  [phone]  Physical Therapy Treatment  Patient Details  Name: [name] [name] MRN: [id number] Date of Birth: 21-Jul-1966 Referring Provider (PT): Dara Lords   Encounter Date: 03/10/2019  PT End of Session - 03/10/19 1144    Visit Number  4    Number of Visits  9    Date for PT Re-Evaluation  03/27/19    PT Start Time  4580    PT Stop Time  9983   pt had to leave for another appt at 1130   PT Time Calculation (min)  38 min    Activity Tolerance  Patient tolerated treatment well    Behavior During Therapy  [facility] for tasks assessed/performed       Past Medical History:  Diagnosis Date  . Allergy    seasonal  . Anal fissure   . Complication of anesthesia   . Diverticulitis   . GERD (gastroesophageal reflux disease) 07/07/2005  . Hemorrhoid   . Hepatic steatosis   . Hypertension   . Internal hemorrhoids   . Malignant neoplasm of lower-inner quadrant of right breast of female, estrogen receptor positive ([place]) 07/16/2018  . PONV (postoperative nausea and vomiting)   . Positive H. pylori test     Past Surgical History:  Procedure Laterality Date  . BREAST LUMPECTOMY WITH RADIOACTIVE SEED AND SENTINEL LYMPH NODE BIOPSY Right 08/23/2018   Procedure: RIGHT BREAST LUMPECTOMY WITH RADIOACTIVE SEED AND RIGHT SENTINEL LYMPH NODE MAPPING;  Surgeon: Erroll Luna, MD;  Location: [facility];  Service: General;  Laterality: Right;  . COLON RESECTION  2000's Dr. Hassell Done   Diverticulitis.  . COLONOSCOPY    . FOOT SURGERY Left   . g3p2 c-section1    . KNEE SURGERY Right   . PORTACATH PLACEMENT Right 09/24/2018   Procedure: INSERTION PORT-A-CATH WITH ULTRASOUND;  Surgeon: Erroll Luna, MD;  Location: [facility];  Service: General;  Laterality: Right;  . TONSILLECTOMY    . TUBAL LIGATION      There were no vitals filed for this  visit.                    [facility] Adult PT Treatment/Exercise - 03/10/19 0001      Manual Therapy   Manual Therapy  Soft tissue mobilization;Myofascial release;Scapular mobilization;Manual Lymphatic Drainage (MLD)    Soft tissue mobilization  In sitting at start of session for STM to Rt upper trap and to medial scapular border where pt c/o most tenderness using lotion. Then into supine for remainder of treatment of MLD and myofsacial release    Myofascial Release  To Rt axilla and over area of seroma. This is very minimally palpable now though pt still very tender to touch here    Scapular Mobilization  In Lt S/L for protraction and retraction, with some scapular depression to her tolerance which was moderate    Manual Lymphatic Drainage (MLD)  Manual lymph drainage to address right lateral trunk and posterior upper back edema: in supine - short neck, right inguinal and left axillary nodes, right axillo-inguinal and anterior inter-axillary pathway; in left S/L focused on right axillo-inguinal pathway a bit more lateral and then posterior inter-axillary pathway.    Passive ROM  --                  PT Long Term Goals - 02/27/19 1218      PT  LONG TERM GOAL #1   Title  Pt will report a 75% improvement in pain around right lumpectomy scar and upper quadrant    Time  4    Period  Weeks    Status  New    Target Date  03/27/19      PT LONG TERM GOAL #2   Title  Pt will demonstate 160 degrees of right shoulder flexion to allow her to reach items on top shelf.    Baseline  140    Time  4    Period  Weeks    Status  New    Target Date  03/27/19      PT LONG TERM GOAL #3   Title  Pt will report a 75% improvement in feeling of fatigue in R UE with RUE movements to allow her to return to prior level of function    Time  4    Period  Weeks    Status  New    Target Date  03/27/19      PT LONG TERM GOAL #4   Title  Pt will be independent in a home exercise program for  continued strengthening and stretching    Time  4    Period  Weeks    Status  New    Target Date  03/27/19            Plan - 03/10/19 1057    Clinical Impression Statement  Added soft tissue work to Rt scapula and upper trap area in sitting. Tightness at medial border palpable that improved as did her sensitivity. Also continued with manual lymph drainage and myofascial release which pt tolerated better today with some less sensitivity.    Personal Factors and Comorbidities  Time since onset of injury/illness/exacerbation;Other   lives alone   Examination-Activity Limitations  Bathing;Reach Overhead;Carry    Examination-Participation Restrictions  Meal Prep;Cleaning    Stability/Clinical Decision Making  Evolving/Moderate complexity    Rehab Potential  Good    Clinical Impairments Affecting Rehab Potential  pt undergoing radiation    PT Frequency  2x / week    PT Duration  4 weeks    PT Treatment/Interventions  ADLs/Self Care Home Management;Therapeutic exercise;Patient/family education;Therapeutic activities;Manual techniques;Manual lymph drainage;Scar mobilization;Passive range of motion;Taping    PT Next Visit Plan  Manual lymph drainage to focus on right lateral proximal trunk under axilla; cont gentle right UE strength program reviewing HEP issued today prn; progess to supine scapula series in next 1-2 sessions    PT Home Exercise Plan  gripping washcloth; standing Rt shoulder isometrics    Consulted and Agree with Plan of Care  Patient       Patient will benefit from skilled therapeutic intervention in order to improve the following deficits and impairments:  Postural dysfunction, Decreased range of motion, Impaired UE functional use, Pain, Decreased knowledge of precautions, Decreased scar mobility, Increased edema, Increased fascial restricitons  Visit Diagnosis: 1. Stiffness of right shoulder joint   2. Aftercare following surgery for neoplasm   3. Acute pain of right  shoulder   4. Muscle weakness (generalized)   5. Localized edema        Problem List Patient Active Problem List   Diagnosis Date Noted  . Port-A-Cath in place 09/25/2018  . Malignant neoplasm of lower-inner quadrant of right breast of female, estrogen receptor positive ([place]) 07/16/2018  . Special screening for malignant neoplasms, colon 07/10/2017  . Anal fissure 07/07/2014  . Pruritic rash 06/16/2014  .  Obesity 06/16/2014  . Primary localized osteoarthrosis, lower leg 11/18/2013  . Bilateral knee pain 09/08/2013  . VITAMIN B12 DEFICIENCY 02/10/2010  . Essential hypertension 08/16/2007  . ALLERGIC RHINITIS 08/16/2007  . GERD 08/16/2007  . Right upper quadrant pain 05/13/2007    Otelia Limes, PTA 03/10/2019, 11:57 AM  [place], [place], [address] Phone: [phone]   Fax:  [phone]  Name: Raylin Diguglielmo MRN: [id number] Date of Birth: [birth year]

## 2019-03-11 ENCOUNTER — Inpatient Hospital Stay: Payer: BC Managed Care – PPO | Attending: Hematology and Oncology

## 2019-03-11 ENCOUNTER — Other Ambulatory Visit: Payer: Self-pay

## 2019-03-11 ENCOUNTER — Ambulatory Visit
Admission: RE | Admit: 2019-03-11 | Discharge: 2019-03-11 | Disposition: A | Discharge status: Home / Self Care | Payer: BC Managed Care – PPO | Source: Ambulatory Visit | Attending: Radiation Oncology | Admitting: Radiation Oncology

## 2019-03-11 ENCOUNTER — Encounter: Payer: Self-pay | Admitting: General Practice

## 2019-03-11 ENCOUNTER — Encounter: Payer: Self-pay | Admitting: *Deleted

## 2019-03-11 DIAGNOSIS — Z95828 Presence of other vascular implants and grafts: Secondary | ICD-10-CM

## 2019-03-11 DIAGNOSIS — C50311 Malignant neoplasm of lower-inner quadrant of right female breast: Secondary | ICD-10-CM | POA: Insufficient documentation

## 2019-03-11 DIAGNOSIS — Z452 Encounter for adjustment and management of vascular access device: Secondary | ICD-10-CM | POA: Diagnosis not present

## 2019-03-11 DIAGNOSIS — Z170 Estrogen receptor positive status [ER+]: Secondary | ICD-10-CM

## 2019-03-11 MED ORDER — HEPARIN SOD (PORK) LOCK FLUSH 100 UNIT/ML IV SOLN
500.0000 [IU] | Freq: Once | INTRAVENOUS | Status: AC
  Administered 2019-03-11: 500 [IU]
  Filled 2019-03-11: qty 5

## 2019-03-11 MED ORDER — SODIUM CHLORIDE 0.9% FLUSH
10.0000 mL | Freq: Once | INTRAVENOUS | Status: AC
  Administered 2019-03-11: 10 mL
  Filled 2019-03-11: qty 10

## 2019-03-11 NOTE — Patient Instructions (Signed)

## 2019-03-12 ENCOUNTER — Other Ambulatory Visit: Payer: Self-pay

## 2019-03-12 ENCOUNTER — Ambulatory Visit
Admission: RE | Admit: 2019-03-12 | Discharge: 2019-03-12 | Disposition: A | Discharge status: Home / Self Care | Payer: BC Managed Care – PPO | Source: Ambulatory Visit | Attending: Radiation Oncology | Admitting: Radiation Oncology

## 2019-03-12 DIAGNOSIS — C50311 Malignant neoplasm of lower-inner quadrant of right female breast: Secondary | ICD-10-CM | POA: Diagnosis not present

## 2019-03-13 ENCOUNTER — Ambulatory Visit
Admission: RE | Admit: 2019-03-13 | Discharge: 2019-03-13 | Disposition: A | Discharge status: Home / Self Care | Payer: BC Managed Care – PPO | Source: Ambulatory Visit | Attending: Radiation Oncology | Admitting: Radiation Oncology

## 2019-03-13 ENCOUNTER — Other Ambulatory Visit: Payer: Self-pay

## 2019-03-13 DIAGNOSIS — C50311 Malignant neoplasm of lower-inner quadrant of right female breast: Secondary | ICD-10-CM | POA: Diagnosis not present

## 2019-03-14 ENCOUNTER — Other Ambulatory Visit: Payer: Self-pay

## 2019-03-14 ENCOUNTER — Encounter (HOSPITAL_COMMUNITY): Payer: Self-pay | Admitting: General Practice

## 2019-03-14 ENCOUNTER — Ambulatory Visit
Admission: RE | Admit: 2019-03-14 | Discharge: 2019-03-14 | Disposition: A | Discharge status: Home / Self Care | Payer: BC Managed Care – PPO | Source: Ambulatory Visit | Attending: Radiation Oncology | Admitting: Radiation Oncology

## 2019-03-14 DIAGNOSIS — C50311 Malignant neoplasm of lower-inner quadrant of right female breast: Secondary | ICD-10-CM | POA: Diagnosis not present

## 2019-03-14 NOTE — Progress Notes (Signed)
[facility] CSW Progress Notes  Email from [employer], they approved continued funding for patient to pay for living expenses/bills during September 2020.  Edwyna Shell, LCSW Clinical Social Worker Phone:  [phone]

## 2019-03-17 ENCOUNTER — Other Ambulatory Visit: Payer: Self-pay

## 2019-03-17 ENCOUNTER — Ambulatory Visit
Admission: RE | Admit: 2019-03-17 | Discharge: 2019-03-17 | Disposition: A | Discharge status: Home / Self Care | Payer: BC Managed Care – PPO | Source: Ambulatory Visit | Attending: Radiation Oncology | Admitting: Radiation Oncology

## 2019-03-17 DIAGNOSIS — C50311 Malignant neoplasm of lower-inner quadrant of right female breast: Secondary | ICD-10-CM | POA: Diagnosis not present

## 2019-03-18 ENCOUNTER — Ambulatory Visit: Payer: BC Managed Care – PPO

## 2019-03-18 ENCOUNTER — Ambulatory Visit
Admission: RE | Admit: 2019-03-18 | Discharge: 2019-03-18 | Disposition: A | Discharge status: Home / Self Care | Payer: BC Managed Care – PPO | Source: Ambulatory Visit | Attending: Radiation Oncology | Admitting: Radiation Oncology

## 2019-03-18 ENCOUNTER — Other Ambulatory Visit: Payer: Self-pay

## 2019-03-18 DIAGNOSIS — M25511 Pain in right shoulder: Secondary | ICD-10-CM

## 2019-03-18 DIAGNOSIS — R600 Localized edema: Secondary | ICD-10-CM

## 2019-03-18 DIAGNOSIS — M25611 Stiffness of right shoulder, not elsewhere classified: Secondary | ICD-10-CM | POA: Diagnosis not present

## 2019-03-18 DIAGNOSIS — C50311 Malignant neoplasm of lower-inner quadrant of right female breast: Secondary | ICD-10-CM | POA: Diagnosis not present

## 2019-03-18 DIAGNOSIS — M6281 Muscle weakness (generalized): Secondary | ICD-10-CM

## 2019-03-18 DIAGNOSIS — Z483 Aftercare following surgery for neoplasm: Secondary | ICD-10-CM

## 2019-03-18 NOTE — Therapy (Signed)
[place], [place], [address] Phone: [phone]   Fax:  [phone]  Physical Therapy Treatment  Patient Details  Name: [name] [name] MRN: [id number] Date of Birth: [birth year] Referring Provider (PT): Dara Lords   Encounter Date: 03/18/2019  PT End of Session - 03/18/19 1209    Visit Number  5    Number of Visits  9    Date for PT Re-Evaluation  03/27/19    PT Start Time  1108    PT Stop Time  1205    PT Time Calculation (min)  57 min    Activity Tolerance  Patient tolerated treatment well    Behavior During Therapy  [facility] for tasks assessed/performed       Past Medical History:  Diagnosis Date  . Allergy    seasonal  . Anal fissure   . Complication of anesthesia   . Diverticulitis   . GERD (gastroesophageal reflux disease) 07/07/2005  . Hemorrhoid   . Hepatic steatosis   . Hypertension   . Internal hemorrhoids   . Malignant neoplasm of lower-inner quadrant of right breast of female, estrogen receptor positive ([place]) 07/16/2018  . PONV (postoperative nausea and vomiting)   . Positive H. pylori test     Past Surgical History:  Procedure Laterality Date  . BREAST LUMPECTOMY WITH RADIOACTIVE SEED AND SENTINEL LYMPH NODE BIOPSY Right 08/23/2018   Procedure: RIGHT BREAST LUMPECTOMY WITH RADIOACTIVE SEED AND RIGHT SENTINEL LYMPH NODE MAPPING;  Surgeon: Erroll Luna, MD;  Location: [facility];  Service: General;  Laterality: Right;  . COLON RESECTION  2000's Dr. Hassell Done   Diverticulitis.  . COLONOSCOPY    . FOOT SURGERY Left   . g3p2 c-section1    . KNEE SURGERY Right   . PORTACATH PLACEMENT Right 09/24/2018   Procedure: INSERTION PORT-A-CATH WITH ULTRASOUND;  Surgeon: Erroll Luna, MD;  Location: [facility];  Service: General;  Laterality: Right;  . TONSILLECTOMY    . TUBAL LIGATION      There were no vitals filed for this visit.  Subjective Assessment - 03/18/19 1112     Subjective  My Rt upper trap feels better after last time. I've been taking Aleve for the inreased pain in my Rt axilla and that;s helped some.    Pertinent History  Patient was diagnosed on 07/13/18 with right grade III invasive ductal carcinoma breast cancer. It measures 6 mm, is ER positive and PR negative, HER2 negative with a Ki67 of 50%. , 08/23/18- R lumpectomy and SLNB 0/3 negative, pt has completed chemo and is currently undergoing radiation    Patient Stated Goals  to get the fluid out and decrease pain    Currently in Pain?  Yes    Pain Score  5     Pain Location  Axilla    Pain Orientation  Right    Pain Descriptors / Indicators  Nagging;Sharp    Pain Onset  More than a month ago    Pain Frequency  Intermittent    Aggravating Factors   Aleve has helped    Pain Relieving Factors  compression foam is helping                       [facility] Adult PT Treatment/Exercise - 03/18/19 0001      Shoulder Exercises: Supine   Horizontal ABduction  Strengthening;Both;5 reps;Theraband    Theraband Level (Shoulder Horizontal ABduction)  Level 1 (Yellow)  External Rotation  Strengthening;Both;5 reps;Theraband    Theraband Level (Shoulder External Rotation)  Level 1 (Yellow)    Flexion  Strengthening;Both;5 reps;Theraband   Narrow and Wide grip, 5 times each   Theraband Level (Shoulder Flexion)  Level 1 (Yellow)    Diagonals  Strengthening;Right;Left;5 reps;Theraband    Theraband Level (Shoulder Diagonals)  Level 1 (Yellow)      Manual Therapy   Myofascial Release  To Rt axilla and over area of seroma. This is very minimally palpable now and pts tenderness is much improved    Scapular Mobilization  --    Manual Lymphatic Drainage (MLD)  Manual lymph drainage to address right lateral trunk and posterior upper back edema: in supine - short neck, right inguinal and left axillary nodes, right axillo-inguinal and anterior inter-axillary pathway; in left S/L focused on right  axillo-inguinal pathway a bit more lateral and then posterior inter-axillary pathway.             PT Education - 03/18/19 1205    Education Details  Supine scapular series with yellow theraband    Person(s) Educated  Patient    Methods  Explanation;Demonstration;Handout    Comprehension  Verbalized understanding;Returned demonstration;Need further instruction          PT Long Term Goals - 02/27/19 1218      PT LONG TERM GOAL #1   Title  Pt will report a 75% improvement in pain around right lumpectomy scar and upper quadrant    Time  4    Period  Weeks    Status  New    Target Date  03/27/19      PT LONG TERM GOAL #2   Title  Pt will demonstate 160 degrees of right shoulder flexion to allow her to reach items on top shelf.    Baseline  140    Time  4    Period  Weeks    Status  New    Target Date  03/27/19      PT LONG TERM GOAL #3   Title  Pt will report a 75% improvement in feeling of fatigue in R UE with RUE movements to allow her to return to prior level of function    Time  4    Period  Weeks    Status  New    Target Date  03/27/19      PT LONG TERM GOAL #4   Title  Pt will be independent in a home exercise program for continued strengthening and stretching    Time  4    Period  Weeks    Status  New    Target Date  03/27/19            Plan - 03/18/19 1358    Clinical Impression Statement  Pts tenderness seems to be improving well. Her P/ROM is still limited and she required multiple VCs to relax during session but she was less guarded during stretching. Progressed her HEP to include supine scapular series which she tolerated well without increased pain, though did require VCs for correct technique with er.    Personal Factors and Comorbidities  Time since onset of injury/illness/exacerbation;Other   lives alone   Examination-Activity Limitations  Bathing;Reach Overhead;Carry    Examination-Participation Restrictions  Meal Prep;Cleaning     Stability/Clinical Decision Making  Evolving/Moderate complexity    Rehab Potential  Good    Clinical Impairments Affecting Rehab Potential  pt undergoing radiation    PT Frequency  2x / week    PT Duration  4 weeks    PT Treatment/Interventions  ADLs/Self Care Home Management;Therapeutic exercise;Patient/family education;Therapeutic activities;Manual techniques;Manual lymph drainage;Scar mobilization;Passive range of motion;Taping    PT Next Visit Plan  Manual lymph drainage to focus on right lateral proximal trunk under axilla; cont gentle right UE strength program reviewing HEP issued today prn; review supine scapula series in next 1-2 sessions    PT Home Exercise Plan  gripping washcloth; standing Rt shoulder isometrics; supine scapular series    Consulted and Agree with Plan of Care  Patient       Patient will benefit from skilled therapeutic intervention in order to improve the following deficits and impairments:  Postural dysfunction, Decreased range of motion, Impaired UE functional use, Pain, Decreased knowledge of precautions, Decreased scar mobility, Increased edema, Increased fascial restricitons  Visit Diagnosis: 1. Stiffness of right shoulder joint   2. Aftercare following surgery for neoplasm   3. Acute pain of right shoulder   4. Muscle weakness (generalized)   5. Localized edema        Problem List Patient Active Problem List   Diagnosis Date Noted  . Port-A-Cath in place 09/25/2018  . Malignant neoplasm of lower-inner quadrant of right breast of female, estrogen receptor positive ([place]) 07/16/2018  . Special screening for malignant neoplasms, colon 07/10/2017  . Anal fissure 07/07/2014  . Pruritic rash 06/16/2014  . Obesity 06/16/2014  . Primary localized osteoarthrosis, lower leg 11/18/2013  . Bilateral knee pain 09/08/2013  . VITAMIN B12 DEFICIENCY 02/10/2010  . Essential hypertension 08/16/2007  . ALLERGIC RHINITIS 08/16/2007  . GERD 08/16/2007  . Right  upper quadrant pain 05/13/2007    Otelia Limes, PTA 03/18/2019, 2:06 PM  [place], [place], [address] Phone: [phone]   Fax:  [phone]  Name: Satina Jerrell MRN: [id number] Date of Birth: 05/28/1966

## 2019-03-18 NOTE — Patient Instructions (Signed)

## 2019-03-19 ENCOUNTER — Ambulatory Visit
Admission: RE | Admit: 2019-03-19 | Discharge: 2019-03-19 | Disposition: A | Discharge status: Home / Self Care | Payer: BC Managed Care – PPO | Source: Ambulatory Visit | Attending: Radiation Oncology | Admitting: Radiation Oncology

## 2019-03-19 ENCOUNTER — Other Ambulatory Visit: Payer: Self-pay

## 2019-03-19 DIAGNOSIS — C50311 Malignant neoplasm of lower-inner quadrant of right female breast: Secondary | ICD-10-CM | POA: Diagnosis not present

## 2019-03-20 ENCOUNTER — Ambulatory Visit: Payer: BC Managed Care – PPO

## 2019-03-20 ENCOUNTER — Ambulatory Visit
Admission: RE | Admit: 2019-03-20 | Discharge: 2019-03-20 | Disposition: A | Discharge status: Home / Self Care | Payer: BC Managed Care – PPO | Source: Ambulatory Visit | Attending: Radiation Oncology | Admitting: Radiation Oncology

## 2019-03-20 ENCOUNTER — Other Ambulatory Visit: Payer: Self-pay

## 2019-03-20 DIAGNOSIS — M25611 Stiffness of right shoulder, not elsewhere classified: Secondary | ICD-10-CM | POA: Diagnosis not present

## 2019-03-20 DIAGNOSIS — C50311 Malignant neoplasm of lower-inner quadrant of right female breast: Secondary | ICD-10-CM | POA: Diagnosis not present

## 2019-03-20 DIAGNOSIS — R600 Localized edema: Secondary | ICD-10-CM

## 2019-03-20 DIAGNOSIS — M6281 Muscle weakness (generalized): Secondary | ICD-10-CM

## 2019-03-20 DIAGNOSIS — M25511 Pain in right shoulder: Secondary | ICD-10-CM

## 2019-03-20 DIAGNOSIS — Z483 Aftercare following surgery for neoplasm: Secondary | ICD-10-CM

## 2019-03-20 NOTE — Therapy (Signed)
[place], [place], [address] Phone: [phone]   Fax:  [phone]  Physical Therapy Treatment  Patient Details  Name: [name] [name] MRN: [id number] Date of Birth: [birth year] Referring Provider (PT): Dara Lords   Encounter Date: 03/20/2019  PT End of Session - 03/20/19 1053    Visit Number  6    Number of Visits  9    Date for PT Re-Evaluation  03/27/19    PT Start Time  1003    PT Stop Time  1051    PT Time Calculation (min)  48 min    Activity Tolerance  Patient tolerated treatment well    Behavior During Therapy  [facility] for tasks assessed/performed       Past Medical History:  Diagnosis Date  . Allergy    seasonal  . Anal fissure   . Complication of anesthesia   . Diverticulitis   . GERD (gastroesophageal reflux disease) 07/07/2005  . Hemorrhoid   . Hepatic steatosis   . Hypertension   . Internal hemorrhoids   . Malignant neoplasm of lower-inner quadrant of right breast of female, estrogen receptor positive ([place]) 07/16/2018  . PONV (postoperative nausea and vomiting)   . Positive H. pylori test     Past Surgical History:  Procedure Laterality Date  . BREAST LUMPECTOMY WITH RADIOACTIVE SEED AND SENTINEL LYMPH NODE BIOPSY Right 08/23/2018   Procedure: RIGHT BREAST LUMPECTOMY WITH RADIOACTIVE SEED AND RIGHT SENTINEL LYMPH NODE MAPPING;  Surgeon: Erroll Luna, MD;  Location: [place];  Service: General;  Laterality: Right;  . COLON RESECTION  2000's Dr. Hassell Done   Diverticulitis.  . COLONOSCOPY    . FOOT SURGERY Left   . g3p2 c-section1    . KNEE SURGERY Right   . PORTACATH PLACEMENT Right 09/24/2018   Procedure: INSERTION PORT-A-CATH WITH ULTRASOUND;  Surgeon: Erroll Luna, MD;  Location: [place];  Service: General;  Laterality: Right;  . TONSILLECTOMY    . TUBAL LIGATION      There were no vitals filed for this visit.  Subjective Assessment - 03/20/19 1005     Subjective  I'm feeling better after last time. I can tell the tightness improves each time and my relief lasts longer each time. And now I just have some soreness at my Rt axilla, no pain.    Pertinent History  Patient was diagnosed on 07/13/18 with right grade III invasive ductal carcinoma breast cancer. It measures 6 mm, is ER positive and PR negative, HER2 negative with a Ki67 of 50%. , 08/23/18- R lumpectomy and SLNB 0/3 negative, pt has completed chemo and is currently undergoing radiation    Patient Stated Goals  to get the fluid out and decrease pain    Currently in Pain?  No/denies                       [facility] Adult PT Treatment/Exercise - 03/20/19 0001      Manual Therapy   Myofascial Release  To Rt axilla and over area of seroma. This is very minimally palpable now and pts tenderness is much improved    Manual Lymphatic Drainage (MLD)  Manual lymph drainage to address right lateral trunk and posterior upper back edema: in supine - short neck, right inguinal and left axillary nodes, right axillo-inguinal and anterior inter-axillary pathway; in left S/L focused on right axillo-inguinal pathway a bit more lateral and then posterior inter-axillary pathway.    Passive  ROM  PROM right shoulder in supine: flexion, abduction, IR and ER to pt tolerance with gentle neural stretching                  PT Long Term Goals - 02/27/19 1218      PT LONG TERM GOAL #1   Title  Pt will report a 75% improvement in pain around right lumpectomy scar and upper quadrant    Time  4    Period  Weeks    Status  New    Target Date  03/27/19      PT LONG TERM GOAL #2   Title  Pt will demonstate 160 degrees of right shoulder flexion to allow her to reach items on top shelf.    Baseline  140    Time  4    Period  Weeks    Status  New    Target Date  03/27/19      PT LONG TERM GOAL #3   Title  Pt will report a 75% improvement in feeling of fatigue in R UE with RUE movements to  allow her to return to prior level of function    Time  4    Period  Weeks    Status  New    Target Date  03/27/19      PT LONG TERM GOAL #4   Title  Pt will be independent in a home exercise program for continued strengthening and stretching    Time  4    Period  Weeks    Status  New    Target Date  03/27/19            Plan - 03/20/19 1054    Clinical Impression Statement  Pt reports her tenderness much improved since last session, most definitely from start of care. Also her ROM is improved and seroma very minimally palpable at this time. She reports has been doing the EHP issued at last session with no problems in supine and standing. Did instruct her that standing okay but doing against wall would be best for correct technique and postural alignment and she verbalized understanding.    Personal Factors and Comorbidities  Time since onset of injury/illness/exacerbation;Other   lives alone   Examination-Activity Limitations  Bathing;Reach Overhead;Carry    Examination-Participation Restrictions  Meal Prep;Cleaning    Stability/Clinical Decision Making  Evolving/Moderate complexity    Rehab Potential  Good    Clinical Impairments Affecting Rehab Potential  pt undergoing radiation    PT Frequency  2x / week    PT Duration  4 weeks    PT Treatment/Interventions  ADLs/Self Care Home Management;Therapeutic exercise;Patient/family education;Therapeutic activities;Manual techniques;Manual lymph drainage;Scar mobilization;Passive range of motion;Taping    PT Next Visit Plan  Probable D/C after next 2 session next week. Reassess goals/ROM next week and continue working towards improving end ROM. Try pulleys and bal up wall for next 2 sessions initially as well.    PT Home Exercise Plan  gripping washcloth; standing Rt shoulder isometrics; supine scapular series    Consulted and Agree with Plan of Care  Patient       Patient will benefit from skilled therapeutic intervention in order to  improve the following deficits and impairments:  Postural dysfunction, Decreased range of motion, Impaired UE functional use, Pain, Decreased knowledge of precautions, Decreased scar mobility, Increased edema, Increased fascial restricitons  Visit Diagnosis: 1. Stiffness of right shoulder joint   2. Aftercare following surgery for neoplasm  3. Acute pain of right shoulder   4. Muscle weakness (generalized)   5. Localized edema        Problem List Patient Active Problem List   Diagnosis Date Noted  . Port-A-Cath in place 09/25/2018  . Malignant neoplasm of lower-inner quadrant of right breast of female, estrogen receptor positive ([place]) 07/16/2018  . Special screening for malignant neoplasms, colon 07/10/2017  . Anal fissure 07/07/2014  . Pruritic rash 06/16/2014  . Obesity 06/16/2014  . Primary localized osteoarthrosis, lower leg 11/18/2013  . Bilateral knee pain 09/08/2013  . VITAMIN B12 DEFICIENCY 02/10/2010  . Essential hypertension 08/16/2007  . ALLERGIC RHINITIS 08/16/2007  . GERD 08/16/2007  . Right upper quadrant pain 05/13/2007    Otelia Limes, PTA 03/20/2019, 11:00 AM  [place], [place], [address] Phone: [phone]   Fax:  [phone]  Name: [name] [name] MRN: [id number] Date of Birth: [birth year]

## 2019-03-21 ENCOUNTER — Ambulatory Visit
Admission: RE | Admit: 2019-03-21 | Discharge: 2019-03-21 | Disposition: A | Discharge status: Home / Self Care | Payer: BC Managed Care – PPO | Source: Ambulatory Visit | Attending: Radiation Oncology | Admitting: Radiation Oncology

## 2019-03-21 ENCOUNTER — Other Ambulatory Visit: Payer: Self-pay

## 2019-03-21 DIAGNOSIS — C50311 Malignant neoplasm of lower-inner quadrant of right female breast: Secondary | ICD-10-CM

## 2019-03-21 DIAGNOSIS — Z170 Estrogen receptor positive status [ER+]: Secondary | ICD-10-CM

## 2019-03-21 MED ORDER — RADIAPLEXRX EX GEL
Freq: Once | CUTANEOUS | Status: AC
  Administered 2019-03-21: 15:00:00 via TOPICAL

## 2019-03-24 ENCOUNTER — Ambulatory Visit: Payer: BC Managed Care – PPO | Attending: Radiation Oncology

## 2019-03-24 ENCOUNTER — Other Ambulatory Visit: Payer: Self-pay

## 2019-03-24 ENCOUNTER — Ambulatory Visit
Admission: RE | Admit: 2019-03-24 | Discharge: 2019-03-24 | Disposition: A | Discharge status: Home / Self Care | Payer: BC Managed Care – PPO | Source: Ambulatory Visit | Attending: Radiation Oncology | Admitting: Radiation Oncology

## 2019-03-24 DIAGNOSIS — Z510 Encounter for antineoplastic radiation therapy: Secondary | ICD-10-CM | POA: Diagnosis not present

## 2019-03-24 DIAGNOSIS — M25611 Stiffness of right shoulder, not elsewhere classified: Secondary | ICD-10-CM | POA: Insufficient documentation

## 2019-03-24 DIAGNOSIS — R600 Localized edema: Secondary | ICD-10-CM | POA: Insufficient documentation

## 2019-03-24 DIAGNOSIS — Z483 Aftercare following surgery for neoplasm: Secondary | ICD-10-CM

## 2019-03-24 DIAGNOSIS — M6281 Muscle weakness (generalized): Secondary | ICD-10-CM

## 2019-03-24 DIAGNOSIS — Z170 Estrogen receptor positive status [ER+]: Secondary | ICD-10-CM | POA: Insufficient documentation

## 2019-03-24 DIAGNOSIS — M25511 Pain in right shoulder: Secondary | ICD-10-CM | POA: Diagnosis present

## 2019-03-24 DIAGNOSIS — C50311 Malignant neoplasm of lower-inner quadrant of right female breast: Secondary | ICD-10-CM | POA: Insufficient documentation

## 2019-03-24 NOTE — Therapy (Addendum)
[place], [place], [address] Phone: [phone]   Fax:  [phone]  Physical Therapy Treatment  Patient Details  Name: [name] [name] MRN: [id number] Date of Birth: 1966-08-05 Referring Provider (PT): Dara Lords   Encounter Date: 03/24/2019  PT End of Session - 03/24/19 3710    Visit Number  7    Number of Visits  9    Date for PT Re-Evaluation  03/27/19    PT Start Time  1232    PT Stop Time  1320    PT Time Calculation (min)  48 min    Activity Tolerance  Patient tolerated treatment well    Behavior During Therapy  [facility] for tasks assessed/performed       Past Medical History:  Diagnosis Date  . Allergy    seasonal  . Anal fissure   . Complication of anesthesia   . Diverticulitis   . GERD (gastroesophageal reflux disease) 07/07/2005  . Hemorrhoid   . Hepatic steatosis   . Hypertension   . Internal hemorrhoids   . Malignant neoplasm of lower-inner quadrant of right breast of female, estrogen receptor positive ([place]) 07/16/2018  . PONV (postoperative nausea and vomiting)   . Positive H. pylori test     Past Surgical History:  Procedure Laterality Date  . BREAST LUMPECTOMY WITH RADIOACTIVE SEED AND SENTINEL LYMPH NODE BIOPSY Right 08/23/2018   Procedure: RIGHT BREAST LUMPECTOMY WITH RADIOACTIVE SEED AND RIGHT SENTINEL LYMPH NODE MAPPING;  Surgeon: Erroll Luna, MD;  Location: [place];  Service: General;  Laterality: Right;  . COLON RESECTION  2000's Dr. Hassell Done   Diverticulitis.  . COLONOSCOPY    . FOOT SURGERY Left   . g3p2 c-section1    . KNEE SURGERY Right   . PORTACATH PLACEMENT Right 09/24/2018   Procedure: INSERTION PORT-A-CATH WITH ULTRASOUND;  Surgeon: Erroll Luna, MD;  Location: [place];  Service: General;  Laterality: Right;  . TONSILLECTOMY    . TUBAL LIGATION      There were no vitals filed for this visit.  Subjective Assessment - 03/24/19 1240     Subjective  I just have been having some nagging around my incision that's probably from radiation as I'm having my 23rd treatment today. Overall, I can tell I'm getting alot better.    Pertinent History  Patient was diagnosed on 07/13/18 with right grade III invasive ductal carcinoma breast cancer. It measures 6 mm, is ER positive and PR negative, HER2 negative with a Ki67 of 50%. , 08/23/18- R lumpectomy and SLNB 0/3 negative, pt has completed chemo and is currently undergoing radiation    Patient Stated Goals  to get the fluid out and decrease pain    Currently in Pain?  No/denies                       [facility] Adult PT Treatment/Exercise - 03/24/19 0001      Shoulder Exercises: Pulleys   Flexion  2 minutes    Flexion Limitations  Pt returned therapist demo and requiredVCs and demo throughout to decrease Rt shoulder compensation    ABduction  2 minutes    ABduction Limitations  Pt returned therapist demo with VCs throughout to decrease Rt scapular compensation      Manual Therapy   Myofascial Release  To Rt axilla and over area of seroma. This is still mimimally palpable though pt reports increased tenderness here from radiation.    Manual  Lymphatic Drainage (MLD)  Manual lymph drainage to address right lateral trunk and posterior upper back edema: in supine - short neck, right inguinal and left axillary nodes, right axillo-inguinal and anterior inter-axillary pathway; in left S/L focused on right axillo-inguinal pathway a bit more lateral and then posterior inter-axillary pathway.    Passive ROM  PROM right shoulder in supine: flexion, abduction, IR and ER to pt tolerance with gentle neural stretching                  PT Long Term Goals - 02/27/19 1218      PT LONG TERM GOAL #1   Title  Pt will report a 75% improvement in pain around right lumpectomy scar and upper quadrant    Time  4    Period  Weeks    Status  New    Target Date  03/27/19      PT LONG TERM  GOAL #2   Title  Pt will demonstate 160 degrees of right shoulder flexion to allow her to reach items on top shelf.    Baseline  140    Time  4    Period  Weeks    Status  New    Target Date  03/27/19      PT LONG TERM GOAL #3   Title  Pt will report a 75% improvement in feeling of fatigue in R UE with RUE movements to allow her to return to prior level of function    Time  4    Period  Weeks    Status  New    Target Date  03/27/19      PT LONG TERM GOAL #4   Title  Pt will be independent in a home exercise program for continued strengthening and stretching    Time  4    Period  Weeks    Status  New    Target Date  03/27/19            Plan - 03/24/19 1325    Clinical Impression Statement  Pt reporting her tendernes at her seroma is starting to increase again as she is more than half way thru her radiation. Continued with manual therapy but also included gentle AA/ROM at end of session with pulleys which pt tolerated very well. She would like to cont therapy at this point but decrease to 1x/wk to help her try to gain, or at least maintain, her end ROM.    Personal Factors and Comorbidities  Time since onset of injury/illness/exacerbation;Other    Examination-Activity Limitations  Bathing;Reach Overhead;Carry    Examination-Participation Restrictions  Meal Prep;Cleaning    Stability/Clinical Decision Making  Evolving/Moderate complexity    Rehab Potential  Good    Clinical Impairments Affecting Rehab Potential  pt undergoing radiation    PT Frequency  2x / week    PT Duration  4 weeks    PT Treatment/Interventions  ADLs/Self Care Home Management;Therapeutic exercise;Patient/family education;Therapeutic activities;Manual techniques;Manual lymph drainage;Scar mobilization;Passive range of motion;Taping    PT Next Visit Plan  Pt would like to renew therapy but decrease to 1x/week when renewal runs out this week. Reassess goals and A/ROM at next session. Working towards improving  end ROM and progressing to other AA/ROM exs.    PT Home Exercise Plan  gripping washcloth; standing Rt shoulder isometrics; supine scapular series    Consulted and Agree with Plan of Care  Patient       Patient will benefit from  skilled therapeutic intervention in order to improve the following deficits and impairments:  Postural dysfunction, Decreased range of motion, Impaired UE functional use, Pain, Decreased knowledge of precautions, Decreased scar mobility, Increased edema, Increased fascial restricitons  Visit Diagnosis: 1. Stiffness of right shoulder joint   2. Aftercare following surgery for neoplasm   3. Acute pain of right shoulder   4. Muscle weakness (generalized)   5. Localized edema        Problem List Patient Active Problem List   Diagnosis Date Noted  . Port-A-Cath in place 09/25/2018  . Malignant neoplasm of lower-inner quadrant of right breast of female, estrogen receptor positive ([place]) 07/16/2018  . Special screening for malignant neoplasms, colon 07/10/2017  . Anal fissure 07/07/2014  . Pruritic rash 06/16/2014  . Obesity 06/16/2014  . Primary localized osteoarthrosis, lower leg 11/18/2013  . Bilateral knee pain 09/08/2013  . VITAMIN B12 DEFICIENCY 02/10/2010  . Essential hypertension 08/16/2007  . ALLERGIC RHINITIS 08/16/2007  . GERD 08/16/2007  . Right upper quadrant pain 05/13/2007    Otelia Limes, PTA 03/24/2019, 1:29 PM  [place], [place], [address] Phone: [phone]   Fax:  [phone]  Name: [name] [name] MRN: [id number] Date of Birth: 1965/09/20  PHYSICAL THERAPY DISCHARGE SUMMARY  Visits from Start of Care: 7  Current functional level related to goals / functional outcomes: See above   Remaining deficits: See above   Education / Equipment: HEP  Plan: Patient agrees to discharge.  Patient goals were not met. Patient is being  discharged due to not returning since the last visit.  ?????    Allyson Sabal I[place], [place] 07/01/19 11:11 AM

## 2019-03-25 ENCOUNTER — Other Ambulatory Visit: Payer: Self-pay

## 2019-03-25 ENCOUNTER — Ambulatory Visit
Admission: RE | Admit: 2019-03-25 | Discharge: 2019-03-25 | Disposition: A | Discharge status: Home / Self Care | Payer: BC Managed Care – PPO | Source: Ambulatory Visit | Attending: Radiation Oncology | Admitting: Radiation Oncology

## 2019-03-25 DIAGNOSIS — C50311 Malignant neoplasm of lower-inner quadrant of right female breast: Secondary | ICD-10-CM | POA: Diagnosis not present

## 2019-03-26 ENCOUNTER — Ambulatory Visit: Payer: BC Managed Care – PPO

## 2019-03-26 ENCOUNTER — Ambulatory Visit
Admission: RE | Admit: 2019-03-26 | Discharge: 2019-03-26 | Disposition: A | Discharge status: Home / Self Care | Payer: BC Managed Care – PPO | Source: Ambulatory Visit | Attending: Radiation Oncology | Admitting: Radiation Oncology

## 2019-03-26 ENCOUNTER — Other Ambulatory Visit: Payer: Self-pay

## 2019-03-26 DIAGNOSIS — C50311 Malignant neoplasm of lower-inner quadrant of right female breast: Secondary | ICD-10-CM | POA: Diagnosis not present

## 2019-03-27 ENCOUNTER — Ambulatory Visit
Admission: RE | Admit: 2019-03-27 | Discharge: 2019-03-27 | Disposition: A | Discharge status: Home / Self Care | Payer: BC Managed Care – PPO | Source: Ambulatory Visit | Attending: Radiation Oncology | Admitting: Radiation Oncology

## 2019-03-27 DIAGNOSIS — C50311 Malignant neoplasm of lower-inner quadrant of right female breast: Secondary | ICD-10-CM | POA: Diagnosis not present

## 2019-03-28 ENCOUNTER — Ambulatory Visit
Admission: RE | Admit: 2019-03-28 | Discharge: 2019-03-28 | Disposition: A | Discharge status: Home / Self Care | Payer: BC Managed Care – PPO | Source: Ambulatory Visit | Attending: Radiation Oncology | Admitting: Radiation Oncology

## 2019-03-28 ENCOUNTER — Other Ambulatory Visit: Payer: Self-pay

## 2019-03-28 ENCOUNTER — Ambulatory Visit: Payer: BC Managed Care – PPO | Admitting: Radiation Oncology

## 2019-03-28 DIAGNOSIS — C50311 Malignant neoplasm of lower-inner quadrant of right female breast: Secondary | ICD-10-CM | POA: Diagnosis not present

## 2019-03-31 ENCOUNTER — Ambulatory Visit
Admission: RE | Admit: 2019-03-31 | Discharge: 2019-03-31 | Disposition: A | Discharge status: Home / Self Care | Payer: BC Managed Care – PPO | Source: Ambulatory Visit | Attending: Radiation Oncology | Admitting: Radiation Oncology

## 2019-03-31 ENCOUNTER — Other Ambulatory Visit: Payer: Self-pay

## 2019-03-31 DIAGNOSIS — C50311 Malignant neoplasm of lower-inner quadrant of right female breast: Secondary | ICD-10-CM | POA: Diagnosis not present

## 2019-03-31 NOTE — Assessment & Plan Note (Signed)
08/23/2018:Right lumpectomy: IDC, 0.7 cm, grade 3, margins negative, 0/3 lymph nodes negative, ER 40%, PR 0%, HER-2 negative, Ki-67 50%, T1BN0 stage Ia  Oncotype Dx 44: High Risk  Treatment plan: 1.Adj Chemo with Dose dense adriamycin and Cytoxan foll by Taxol weekly X 10 stopped early for toxicities 2. Adjuvant radiation therapy 02/20/2019 -03/28/2019 followed by 3. Adjuvant antiestrogen therapy ------------------------------------------------------------------------------------------------------------ Port will be removed 04/15/2019 Treatment plan: Adjuvant antiestrogen therapy with anastrozole 1 mg daily Anastrozole counseling:We discussed the risks and benefits of anti-estrogen therapy with aromatase inhibitors. These include but not limited to insomnia, hot flashes, mood changes, vaginal dryness, bone density loss, and weight gain. We strongly believe that the benefits far outweigh the risks. Patient understands these risks and consented to starting treatment. Planned treatment duration is 7 years.  Chemo-induced peripheral neuropathy:  Return to clinic in 3 months for survivorship care plan visit  

## 2019-04-01 ENCOUNTER — Ambulatory Visit
Admission: RE | Admit: 2019-04-01 | Discharge: 2019-04-01 | Disposition: A | Discharge status: Home / Self Care | Payer: BC Managed Care – PPO | Source: Ambulatory Visit | Attending: Radiation Oncology | Admitting: Radiation Oncology

## 2019-04-01 ENCOUNTER — Other Ambulatory Visit: Payer: Self-pay

## 2019-04-01 DIAGNOSIS — C50311 Malignant neoplasm of lower-inner quadrant of right female breast: Secondary | ICD-10-CM | POA: Diagnosis not present

## 2019-04-02 ENCOUNTER — Ambulatory Visit
Admission: RE | Admit: 2019-04-02 | Discharge: 2019-04-02 | Disposition: A | Discharge status: Home / Self Care | Payer: BC Managed Care – PPO | Source: Ambulatory Visit | Attending: Radiation Oncology | Admitting: Radiation Oncology

## 2019-04-02 ENCOUNTER — Other Ambulatory Visit: Payer: Self-pay

## 2019-04-02 DIAGNOSIS — C50311 Malignant neoplasm of lower-inner quadrant of right female breast: Secondary | ICD-10-CM | POA: Diagnosis not present

## 2019-04-03 ENCOUNTER — Other Ambulatory Visit: Payer: Self-pay

## 2019-04-03 ENCOUNTER — Ambulatory Visit
Admission: RE | Admit: 2019-04-03 | Discharge: 2019-04-03 | Disposition: A | Discharge status: Home / Self Care | Payer: BC Managed Care – PPO | Source: Ambulatory Visit | Attending: Radiation Oncology | Admitting: Radiation Oncology

## 2019-04-03 DIAGNOSIS — C50311 Malignant neoplasm of lower-inner quadrant of right female breast: Secondary | ICD-10-CM | POA: Diagnosis not present

## 2019-04-04 ENCOUNTER — Ambulatory Visit
Admission: RE | Admit: 2019-04-04 | Discharge: 2019-04-04 | Disposition: A | Discharge status: Home / Self Care | Payer: BC Managed Care – PPO | Source: Ambulatory Visit | Attending: Radiation Oncology | Admitting: Radiation Oncology

## 2019-04-04 ENCOUNTER — Other Ambulatory Visit: Payer: Self-pay

## 2019-04-04 DIAGNOSIS — C50311 Malignant neoplasm of lower-inner quadrant of right female breast: Secondary | ICD-10-CM | POA: Diagnosis not present

## 2019-04-04 NOTE — Progress Notes (Signed)
  Radiation Oncology         [id number]) [id number] ________________________________  Name: [name] [name] MRN: [id number]  Date: 02/12/2019  DOB: 25-May-1966  DIAGNOSIS:     ICD-10-CM   1. Malignant neoplasm of lower-inner quadrant of right breast of female, estrogen receptor positive ([place])  C50.311    Z17.0      SIMULATION AND TREATMENT PLANNING NOTE  The patient presented for simulation prior to beginning her course of radiation treatment for her diagnosis of right-sided breast cancer. The patient was placed in a supine position on a breast board. A customized vac-lock bag was constructed and this complex treatment device will be used on a daily basis during her treatment. In this fashion, a CT scan was obtained through the chest area and an isocenter was placed near the chest wall within the breast.  The patient will be planned to receive a course of radiation initially to a dose of 50.4 Gy. This will consist of a whole breast radiotherapy technique. To accomplish this, 2 customized blocks have been designed which will correspond to medial and lateral whole breast tangent fields. This treatment will be accomplished at 1.8 Gy per fraction. A forward planning technique will also be evaluated to determine if this approach improves the plan. It is anticipated that the patient will then receive a 10 Gy boost to the seroma cavity which has been contoured. This will be accomplished at 2 Gy per fraction.   This initial treatment will consist of a 3-D conformal technique. The seroma has been contoured as the primary target structure. Additionally, dose volume histograms of both this target as well as the lungs and heart will also be evaluated. Such an approach is necessary to ensure that the target area is adequately covered while the nearby critical  normal structures are adequately spared.  Plan:  The final anticipated total dose therefore will correspond to 60.4 Gy.     _______________________________   Jodelle Gross, MD, PhD

## 2019-04-04 NOTE — Progress Notes (Signed)
  Radiation Oncology         [phone]) [phone] ________________________________  Name: [name] [name] MRN: [id number]  Date: 02/12/2019  DOB: September 05, 1965  Optical Surface Tracking Plan:  Since intensity modulated radiotherapy (IMRT) and 3D conformal radiation treatment methods are predicated on accurate and precise positioning for treatment, intrafraction motion monitoring is medically necessary to ensure accurate and safe treatment delivery.  The ability to quantify intrafraction motion without excessive ionizing radiation dose can only be performed with optical surface tracking. Accordingly, surface imaging offers the opportunity to obtain 3D measurements of patient position throughout IMRT and 3D treatments without excessive radiation exposure.  I am ordering optical surface tracking for this patient's upcoming course of radiotherapy. ________________________________  Kyung Rudd, MD 04/04/2019 9:46 AM    Reference:   Ursula Alert, J, et al. Surface imaging-based analysis of intrafraction motion for breast radiotherapy patients.Journal of [employer], n. 6, nov. 2014. ISSN [id number].   Available at: <http://www.jacmp.org/index.php/jacmp/article/view/4957>.

## 2019-04-06 NOTE — Progress Notes (Signed)
Patient Care Team: [name], [name], MD as PCP - General (Family Medicine) Erroll Luna, MD as Consulting Physician (General Surgery) Nicholas Lose, MD as Consulting Physician (Hematology and Oncology) Kyung Rudd, MD as Consulting Physician (Radiation Oncology) Rockwell Germany, RN as Oncology Nurse Navigator Mauro Kaufmann, RN as Oncology Nurse Navigator  DIAGNOSIS:    ICD-10-CM   1. Malignant neoplasm of lower-inner quadrant of right breast of female, estrogen receptor positive ([place])  C50.311    Z17.0     SUMMARY OF ONCOLOGIC HISTORY: Oncology History  Malignant neoplasm of lower-inner quadrant of right breast of female, estrogen receptor positive ([place])  07/16/2018 Initial Diagnosis   Pain right breast UOQ: Negative on mammogram, incidentally found right breast mass LIQ at 3:30 position measuring 0.6 cm biopsy revealed grade 3 IDC ER 40%, PR 0%, HER-2 negative, Ki-67 50%, T1bN0 stage Ib   07/24/2018 Cancer Staging   Staging form: Breast, AJCC 8th Edition - Clinical: Stage IB (cT1b, cN0, cM0, G3, ER+, PR-, HER2-) - Signed by Nicholas Lose, MD on 07/24/2018   08/23/2018 Surgery   Right lumpectomy: IDC, 0.7 cm, grade 3, margins negative, 0/3 lymph nodes negative, ER 40%, PR 0%, HER-2 negative, Ki-67 50%, T1BN0 stage Ia   09/04/2018 Cancer Staging   Staging form: Breast, AJCC 8th Edition - Pathologic: Stage IA (pT1b, pN0, cM0, G3, ER+, PR-, HER2-) - Signed by Gardenia Phlegm, NP on 09/04/2018   09/25/2018 - 01/22/2019 Chemotherapy   Adjuvant chemotherapy: completed 4 cycles dose dense Adriamycin and Cytoxan, completed 10 cycles of Taxol, stopped early due to neuropathy    02/20/2019 -  Radiation Therapy   Adjuvant XRT     CHIEF COMPLIANT: Follow-up to discuss anti-estrogen therapy  INTERVAL HISTORY: [name] [name] is a 53 y.o. with above-mentioned history of right breast cancer treated with lumpectomy, adjuvant chemotherapy, and who is currently recieving  radiation. She presents to the clinic today for follow-up of to discuss further treatment with anti-estrogen therapy.   REVIEW OF SYSTEMS:   Constitutional: Denies fevers, chills or abnormal weight loss Eyes: Denies blurriness of vision Ears, nose, mouth, throat, and face: Denies mucositis or sore throat Respiratory: Denies cough, dyspnea or wheezes Cardiovascular: Denies palpitation, chest discomfort Gastrointestinal: Denies nausea, heartburn or change in bowel habits Skin: Denies abnormal skin rashes Lymphatics: Denies new lymphadenopathy or easy bruising Neurological: Denies numbness, tingling or new weaknesses Behavioral/Psych: Mood is stable, no new changes  Extremities: No lower extremity edema Breast: denies any pain or lumps or nodules in either breasts All other systems were reviewed with the patient and are negative.  I have reviewed the past medical history, past surgical history, social history and family history with the patient and they are unchanged from previous note.  ALLERGIES:  is allergic to ibuprofen; lisinopril; and dilaudid [hydromorphone hcl].  MEDICATIONS:  Current Outpatient Medications  Medication Sig Dispense Refill  . Cholecalciferol (VITAMIN D3) 5000 UNITS TABS Take 1 tablet by mouth daily.    . clobetasol ointment (TEMOVATE) 7.20 % Apply 1 application topically 2 (two) times daily. Then as needed for 30 days  1  . Flaxseed, Linseed, (FLAX SEED OIL PO) Take 5 mLs by mouth every morning.    . gabapentin (NEURONTIN) 300 MG capsule Take 1 capsule (300 mg total) by mouth 3 (three) times daily. 270 capsule 0  . HYDROcodone-acetaminophen (NORCO/VICODIN) 5-325 MG tablet Take 1 tablet by mouth every 4 (four) hours as needed for moderate pain. 30 tablet 0  . lidocaine (  XYLOCAINE) 2 % solution Use as directed 5 mLs in the mouth or throat every 3 (three) hours as needed for mouth pain (swish and swallow of spit). 200 mL 1  . naproxen sodium (ALEVE) 220 MG tablet Take  660 mg by mouth daily as needed (pain).    . nystatin (MYCOSTATIN) 100000 UNIT/ML suspension     . Olmesartan-amLODIPine-HCTZ 40-10-25 MG TABS Take 1 tablet by mouth daily.    Marland Kitchen omeprazole (PRILOSEC) 40 MG capsule TAKE 1 CAPSULE(40 MG) BY MOUTH DAILY 30 capsule 2  . predniSONE (DELTASONE) 5 MG tablet 6 tab x 1 day, 5 tab x 1 day, 4 tab x 1 day, 3 tab x 1 day, 2 tab x 1 day, 1 tab x 1 day, stop 21 tablet 0  . promethazine (PHENERGAN) 25 MG tablet Take 1 tablet (25 mg total) by mouth every 6 (six) hours as needed for nausea. 30 tablet 3  . sulfamethoxazole-trimethoprim (BACTRIM DS) 800-160 MG tablet Take 1 tablet by mouth 2 (two) times daily. 14 tablet 0  . tetrahydrozoline-zinc (VISINE-AC) 0.05-0.25 % ophthalmic solution Place 2 drops into both eyes as needed (dry, itchy eyes).     No current facility-administered medications for this visit.     PHYSICAL EXAMINATION: ECOG PERFORMANCE STATUS: 1 - Symptomatic but completely ambulatory  There were no vitals filed for this visit. There were no vitals filed for this visit.  GENERAL: alert, no distress and comfortable SKIN: skin color, texture, turgor are normal, no rashes or significant lesions EYES: normal, Conjunctiva are pink and non-injected, sclera clear OROPHARYNX: no exudate, no erythema and lips, buccal mucosa, and tongue normal  NECK: supple, thyroid normal size, non-tender, without nodularity LYMPH: no palpable lymphadenopathy in the cervical, axillary or inguinal LUNGS: clear to auscultation and percussion with normal breathing effort HEART: regular rate & rhythm and no murmurs and no lower extremity edema ABDOMEN: abdomen soft, non-tender and normal bowel sounds MUSCULOSKELETAL: no cyanosis of digits and no clubbing  NEURO: alert & oriented x 3 with fluent speech, no focal motor/sensory deficits EXTREMITIES: No lower extremity edema  LABORATORY DATA:  I have reviewed the data as listed CMP Latest Ref Rng & Units 01/29/2019  01/22/2019 01/15/2019  Glucose 70 - 99 mg/dL 104(H) 109(H) 99  BUN 6 - 20 mg/dL _0 Creatinine 0.44 - 1.00 mg/dL 0.89 0.91 0.86  Sodium 135 - 145 mmol/L 140 139 139  Potassium 3.5 - 5.1 mmol/L 3.4(L) 3.5 3.4(L)  Chloride 98 - 111 mmol/L 102 103 102  CO2 22 - 32 mmol/L _1 Calcium 8.9 - 10.3 mg/dL 9.4 9.2 9.2  Total Protein 6.5 - 8.1 g/dL 7.1 6.9 7.1  Total Bilirubin 0.3 - 1.2 mg/dL 0.3 0.4 0.3  Alkaline Phos 38 - 126 U/L 79 71 69  AST 15 - 41 U/L _2 ALT 0 - 44 U/L _3 Lab Results  Component Value Date   WBC 4.0 01/29/2019   HGB 10.5 (L) 01/29/2019   HCT 32.1 (L) 01/29/2019   MCV 99.4 01/29/2019   PLT 173 01/29/2019   NEUTROABS 2.8 01/29/2019    ASSESSMENT & PLAN:  Malignant neoplasm of lower-inner quadrant of right breast of female, estrogen receptor positive ([facility]) 08/23/2018:Right lumpectomy: IDC, 0.7 cm, grade 3, margins negative, 0/3 lymph nodes negative, ER 40%, PR 0%, HER-2 negative, Ki-67 50%, T1BN0 stage Ia  Oncotype Dx 44: High Risk  Treatment plan: 1.Adj Chemo with Dose dense adriamycin  and Cytoxan foll by Taxol weekly X 10 stopped early for toxicities 2. Adjuvant radiation therapy 02/20/2019 -03/28/2019 followed by 3. Adjuvant antiestrogen therapy ------------------------------------------------------------------------------------------------------------ Port will be removed 04/15/2019 Treatment plan: Adjuvant antiestrogen therapy with anastrozole 1 mg daily Anastrozole counseling:We discussed the risks and benefits of anti-estrogen therapy with aromatase inhibitors. These include but not limited to insomnia, hot flashes, mood changes, vaginal dryness, bone density loss, and weight gain. We strongly believe that the benefits far outweigh the risks. Patient understands these risks and consented to starting treatment. Planned treatment duration is 7 years.  Chemo-induced peripheral neuropathy: Stable  Return to clinic in 3 months for survivorship  care plan visit  No orders of the defined types were placed in this encounter.  The patient has a good understanding of the overall plan. she agrees with it. she will call with any problems that may develop before the next visit here.  Nicholas Lose, MD 04/07/2019  Julious Oka Dorshimer am acting as scribe for Dr. Nicholas Lose.  I have reviewed the above documentation for accuracy and completeness, and I agree with the above.

## 2019-04-07 ENCOUNTER — Inpatient Hospital Stay: Payer: BC Managed Care – PPO | Attending: Hematology and Oncology | Admitting: Hematology and Oncology

## 2019-04-07 ENCOUNTER — Ambulatory Visit
Admission: RE | Admit: 2019-04-07 | Discharge: 2019-04-07 | Disposition: A | Discharge status: Home / Self Care | Payer: BC Managed Care – PPO | Source: Ambulatory Visit | Attending: Radiation Oncology | Admitting: Radiation Oncology

## 2019-04-07 ENCOUNTER — Encounter: Payer: Self-pay | Admitting: Radiation Oncology

## 2019-04-07 ENCOUNTER — Other Ambulatory Visit: Payer: Self-pay

## 2019-04-07 DIAGNOSIS — Z79899 Other long term (current) drug therapy: Secondary | ICD-10-CM | POA: Diagnosis not present

## 2019-04-07 DIAGNOSIS — C50311 Malignant neoplasm of lower-inner quadrant of right female breast: Secondary | ICD-10-CM

## 2019-04-07 DIAGNOSIS — Z9221 Personal history of antineoplastic chemotherapy: Secondary | ICD-10-CM | POA: Insufficient documentation

## 2019-04-07 DIAGNOSIS — Z170 Estrogen receptor positive status [ER+]: Secondary | ICD-10-CM | POA: Diagnosis not present

## 2019-04-07 DIAGNOSIS — Z923 Personal history of irradiation: Secondary | ICD-10-CM | POA: Diagnosis not present

## 2019-04-07 MED ORDER — ANASTROZOLE 1 MG PO TABS: 1.0000 mg | ORAL_TABLET | Freq: Every day | ORAL | 3 refills | Status: DC

## 2019-04-08 ENCOUNTER — Other Ambulatory Visit: Payer: Self-pay

## 2019-04-08 ENCOUNTER — Telehealth: Payer: Self-pay | Admitting: Adult Health

## 2019-04-08 ENCOUNTER — Encounter (HOSPITAL_BASED_OUTPATIENT_CLINIC_OR_DEPARTMENT_OTHER): Payer: Self-pay | Admitting: *Deleted

## 2019-04-08 ENCOUNTER — Encounter: Payer: Self-pay | Admitting: *Deleted

## 2019-04-08 NOTE — Telephone Encounter (Signed)
Scheduled appt per 8/18 sch message - mailed letter with appt date and time

## 2019-04-09 ENCOUNTER — Encounter (HOSPITAL_BASED_OUTPATIENT_CLINIC_OR_DEPARTMENT_OTHER)
Admission: RE | Admit: 2019-04-09 | Discharge: 2019-04-09 | Disposition: A | Discharge status: Home / Self Care | Payer: BC Managed Care – PPO | Source: Ambulatory Visit | Attending: Surgery | Admitting: Surgery

## 2019-04-09 DIAGNOSIS — Z01812 Encounter for preprocedural laboratory examination: Secondary | ICD-10-CM | POA: Insufficient documentation

## 2019-04-09 LAB — BASIC METABOLIC PANEL
Anion gap: 12 (ref 5–15)
BUN: 11 mg/dL (ref 6–20)
CO2: 25 mmol/L (ref 22–32)
Calcium: 9.3 mg/dL (ref 8.9–10.3)
Chloride: 102 mmol/L (ref 98–111)
Creatinine, Ser: 0.94 mg/dL (ref 0.44–1.00)
GFR calc Af Amer: >60 mL/min (ref >60)
GFR calc non Af Amer: >60 mL/min (ref >60)
Glucose, Bld: 98 mg/dL (ref 70–99)
Potassium: 3.2 mmol/L — ABNORMAL LOW (ref 3.5–5.1)
Sodium: 139 mmol/L (ref 135–145)

## 2019-04-09 NOTE — Progress Notes (Signed)
Pt in for PAT appt. BMET  Sent.      Enhanced Recovery after Surgery for Orthopedics Enhanced Recovery after Surgery is a protocol used to improve the stress on your body and your recovery after surgery.  Patient Instructions  . The night before surgery:  o No food after midnight. ONLY clear liquids after midnight  . The day of surgery (if you do NOT have diabetes):  o Drink ONE (1) Pre-Surgery Clear Ensure as directed.   o This drink was given to you during your hospital  pre-op appointment visit. o The pre-op nurse will instruct you on the time to drink the  Pre-Surgery Ensure depending on your surgery time. o Finish the drink at the designated time by the pre-op nurse.  o Nothing else to drink after completing the  Pre-Surgery Clear Ensure.  . The day of surgery (if you have diabetes): o Drink ONE (1) Gatorade 2 (G2) as directed. o This drink was given to you during your hospital  pre-op appointment visit.  o The pre-op nurse will instruct you on the time to drink the   Gatorade 2 (G2) depending on your surgery time. o Color of the Gatorade may vary. Red is not allowed. o Nothing else to drink after completing the  Gatorade 2 (G2).         If you have questions, please contact your surgeon's office.

## 2019-04-11 ENCOUNTER — Other Ambulatory Visit (HOSPITAL_COMMUNITY)
Admission: RE | Admit: 2019-04-11 | Discharge: 2019-04-11 | Disposition: A | Discharge status: Home / Self Care | Payer: BC Managed Care – PPO | Source: Ambulatory Visit | Attending: Surgery | Admitting: Surgery

## 2019-04-11 DIAGNOSIS — Z01812 Encounter for preprocedural laboratory examination: Secondary | ICD-10-CM | POA: Insufficient documentation

## 2019-04-11 DIAGNOSIS — Z20828 Contact with and (suspected) exposure to other viral communicable diseases: Secondary | ICD-10-CM | POA: Diagnosis not present

## 2019-04-11 LAB — SARS CORONAVIRUS 2 (TAT 6-24 HRS): SARS Coronavirus 2: NEGATIVE

## 2019-04-15 ENCOUNTER — Ambulatory Visit (HOSPITAL_BASED_OUTPATIENT_CLINIC_OR_DEPARTMENT_OTHER): Payer: BC Managed Care – PPO | Admitting: Certified Registered"

## 2019-04-15 ENCOUNTER — Encounter (HOSPITAL_BASED_OUTPATIENT_CLINIC_OR_DEPARTMENT_OTHER): Payer: Self-pay | Admitting: *Deleted

## 2019-04-15 ENCOUNTER — Other Ambulatory Visit: Payer: Self-pay

## 2019-04-15 ENCOUNTER — Encounter (HOSPITAL_BASED_OUTPATIENT_CLINIC_OR_DEPARTMENT_OTHER)
Admission: RE | Disposition: A | Discharge status: Home / Self Care | Payer: Self-pay | Source: Home / Self Care | Attending: Surgery

## 2019-04-15 ENCOUNTER — Ambulatory Visit (HOSPITAL_BASED_OUTPATIENT_CLINIC_OR_DEPARTMENT_OTHER)
Admission: RE | Admit: 2019-04-15 | Discharge: 2019-04-15 | Disposition: A | Discharge status: Home / Self Care | Payer: BC Managed Care – PPO | Attending: Surgery | Admitting: Surgery

## 2019-04-15 DIAGNOSIS — C50311 Malignant neoplasm of lower-inner quadrant of right female breast: Secondary | ICD-10-CM | POA: Diagnosis not present

## 2019-04-15 DIAGNOSIS — K219 Gastro-esophageal reflux disease without esophagitis: Secondary | ICD-10-CM | POA: Insufficient documentation

## 2019-04-15 DIAGNOSIS — Z888 Allergy status to other drugs, medicaments and biological substances status: Secondary | ICD-10-CM | POA: Diagnosis not present

## 2019-04-15 DIAGNOSIS — Z79899 Other long term (current) drug therapy: Secondary | ICD-10-CM | POA: Insufficient documentation

## 2019-04-15 DIAGNOSIS — Z885 Allergy status to narcotic agent status: Secondary | ICD-10-CM | POA: Diagnosis not present

## 2019-04-15 DIAGNOSIS — J302 Other seasonal allergic rhinitis: Secondary | ICD-10-CM | POA: Diagnosis not present

## 2019-04-15 DIAGNOSIS — Z9221 Personal history of antineoplastic chemotherapy: Secondary | ICD-10-CM | POA: Insufficient documentation

## 2019-04-15 DIAGNOSIS — M1990 Unspecified osteoarthritis, unspecified site: Secondary | ICD-10-CM | POA: Diagnosis not present

## 2019-04-15 DIAGNOSIS — Z6841 Body Mass Index (BMI) 40.0 and over, adult: Secondary | ICD-10-CM | POA: Insufficient documentation

## 2019-04-15 DIAGNOSIS — Z170 Estrogen receptor positive status [ER+]: Secondary | ICD-10-CM | POA: Insufficient documentation

## 2019-04-15 DIAGNOSIS — Z452 Encounter for adjustment and management of vascular access device: Secondary | ICD-10-CM | POA: Diagnosis not present

## 2019-04-15 DIAGNOSIS — Z79811 Long term (current) use of aromatase inhibitors: Secondary | ICD-10-CM | POA: Diagnosis not present

## 2019-04-15 DIAGNOSIS — K760 Fatty (change of) liver, not elsewhere classified: Secondary | ICD-10-CM | POA: Diagnosis not present

## 2019-04-15 DIAGNOSIS — I10 Essential (primary) hypertension: Secondary | ICD-10-CM | POA: Diagnosis not present

## 2019-04-15 HISTORY — PX: PORT-A-CATH REMOVAL: SHX5289

## 2019-04-15 SURGERY — REMOVAL PORT-A-CATH
Anesthesia: Monitor Anesthesia Care | Site: Chest | Laterality: Right

## 2019-04-15 MED ORDER — SODIUM CHLORIDE (PF) 0.9 % IJ SOLN
INTRAMUSCULAR | Status: AC
  Filled 2019-04-15: qty 10

## 2019-04-15 MED ORDER — LACTATED RINGERS IV SOLN: INTRAVENOUS | Status: DC

## 2019-04-15 MED ORDER — HEPARIN SOD (PORK) LOCK FLUSH 100 UNIT/ML IV SOLN
INTRAVENOUS | Status: AC
  Filled 2019-04-15: qty 5

## 2019-04-15 MED ORDER — MEPERIDINE HCL 25 MG/ML IJ SOLN: 6.2500 mg | INTRAMUSCULAR | Status: DC | PRN

## 2019-04-15 MED ORDER — HEPARIN (PORCINE) IN NACL 1000-0.9 UT/500ML-% IV SOLN
INTRAVENOUS | Status: AC
  Filled 2019-04-15: qty 500

## 2019-04-15 MED ORDER — DEXAMETHASONE SODIUM PHOSPHATE 10 MG/ML IJ SOLN
INTRAMUSCULAR | Status: AC
  Filled 2019-04-15: qty 1

## 2019-04-15 MED ORDER — CEFAZOLIN SODIUM-DEXTROSE 1-4 GM/50ML-% IV SOLN
INTRAVENOUS | Status: AC
  Filled 2019-04-15: qty 50

## 2019-04-15 MED ORDER — MIDAZOLAM HCL 2 MG/2ML IJ SOLN
INTRAMUSCULAR | Status: AC
  Filled 2019-04-15: qty 2

## 2019-04-15 MED ORDER — CHLORHEXIDINE GLUCONATE CLOTH 2 % EX PADS: 6.0000 | MEDICATED_PAD | Freq: Once | CUTANEOUS | Status: DC

## 2019-04-15 MED ORDER — LIDOCAINE HCL (CARDIAC) PF 100 MG/5ML IV SOSY
PREFILLED_SYRINGE | INTRAVENOUS | Status: DC | PRN
  Administered 2019-04-15: 60 mg via INTRAVENOUS

## 2019-04-15 MED ORDER — SCOPOLAMINE 1 MG/3DAYS TD PT72
MEDICATED_PATCH | TRANSDERMAL | Status: AC
  Filled 2019-04-15: qty 1

## 2019-04-15 MED ORDER — LIDOCAINE-EPINEPHRINE (PF) 1 %-1:200000 IJ SOLN
INTRAMUSCULAR | Status: AC
  Filled 2019-04-15: qty 30

## 2019-04-15 MED ORDER — PROPOFOL 500 MG/50ML IV EMUL
INTRAVENOUS | Status: DC | PRN
  Administered 2019-04-15: 75 ug/kg/min via INTRAVENOUS

## 2019-04-15 MED ORDER — DEXTROSE 5 % IV SOLN
3.0000 g | INTRAVENOUS | Status: AC
  Administered 2019-04-15: 3 g via INTRAVENOUS

## 2019-04-15 MED ORDER — DEXAMETHASONE SODIUM PHOSPHATE 10 MG/ML IJ SOLN
8.0000 mg | Freq: Once | INTRAMUSCULAR | Status: AC
  Administered 2019-04-15: 09:00:00 8 mg via INTRAVENOUS

## 2019-04-15 MED ORDER — ACETAMINOPHEN 325 MG PO TABS: 650.0000 mg | ORAL_TABLET | ORAL | 2 refills | Status: AC | PRN

## 2019-04-15 MED ORDER — FENTANYL CITRATE (PF) 100 MCG/2ML IJ SOLN
INTRAMUSCULAR | Status: DC | PRN
  Administered 2019-04-15: 50 ug via INTRAVENOUS

## 2019-04-15 MED ORDER — MIDAZOLAM HCL 2 MG/2ML IJ SOLN: 1.0000 mg | INTRAMUSCULAR | Status: DC | PRN

## 2019-04-15 MED ORDER — PROPOFOL 10 MG/ML IV BOLUS
INTRAVENOUS | Status: AC
  Filled 2019-04-15: qty 20

## 2019-04-15 MED ORDER — FENTANYL CITRATE (PF) 100 MCG/2ML IJ SOLN: 25.0000 ug | INTRAMUSCULAR | Status: DC | PRN

## 2019-04-15 MED ORDER — LACTATED RINGERS IV SOLN
INTRAVENOUS | Status: DC
  Administered 2019-04-15 (×2): via INTRAVENOUS

## 2019-04-15 MED ORDER — SODIUM BICARBONATE 4.2 % IV SOLN
INTRAVENOUS | Status: AC
  Filled 2019-04-15: qty 10

## 2019-04-15 MED ORDER — METOCLOPRAMIDE HCL 5 MG/ML IJ SOLN: 10.0000 mg | Freq: Once | INTRAMUSCULAR | Status: DC | PRN

## 2019-04-15 MED ORDER — BUPIVACAINE-EPINEPHRINE 0.25% -1:200000 IJ SOLN
INTRAMUSCULAR | Status: DC | PRN
  Administered 2019-04-15: 10 mL

## 2019-04-15 MED ORDER — BUPIVACAINE-EPINEPHRINE (PF) 0.25% -1:200000 IJ SOLN
INTRAMUSCULAR | Status: AC
  Filled 2019-04-15: qty 150

## 2019-04-15 MED ORDER — METHYLENE BLUE 0.5 % INJ SOLN
INTRAVENOUS | Status: AC
  Filled 2019-04-15: qty 10

## 2019-04-15 MED ORDER — CEFAZOLIN SODIUM-DEXTROSE 2-4 GM/100ML-% IV SOLN
INTRAVENOUS | Status: AC
  Filled 2019-04-15: qty 100

## 2019-04-15 MED ORDER — SCOPOLAMINE 1 MG/3DAYS TD PT72
1.0000 | MEDICATED_PATCH | TRANSDERMAL | Status: DC
  Administered 2019-04-15: 1.5 mg via TRANSDERMAL

## 2019-04-15 MED ORDER — FENTANYL CITRATE (PF) 100 MCG/2ML IJ SOLN: 100.0000 ug | INTRAMUSCULAR | Status: DC | PRN

## 2019-04-15 MED ORDER — MIDAZOLAM HCL 2 MG/2ML IJ SOLN
INTRAMUSCULAR | Status: DC | PRN
  Administered 2019-04-15: 2 mg via INTRAVENOUS

## 2019-04-15 MED ORDER — PROPOFOL 500 MG/50ML IV EMUL
INTRAVENOUS | Status: AC
  Filled 2019-04-15: qty 50

## 2019-04-15 MED ORDER — LIDOCAINE 2% (20 MG/ML) 5 ML SYRINGE
INTRAMUSCULAR | Status: AC
  Filled 2019-04-15: qty 5

## 2019-04-15 MED ORDER — FENTANYL CITRATE (PF) 100 MCG/2ML IJ SOLN
INTRAMUSCULAR | Status: AC
  Filled 2019-04-15: qty 2

## 2019-04-15 MED ORDER — ONDANSETRON HCL 4 MG/2ML IJ SOLN
INTRAMUSCULAR | Status: DC | PRN
  Administered 2019-04-15: 4 mg via INTRAVENOUS

## 2019-04-15 MED ORDER — PROPOFOL 10 MG/ML IV BOLUS
INTRAVENOUS | Status: DC | PRN
  Administered 2019-04-15: 10 mg via INTRAVENOUS
  Administered 2019-04-15: 20 mg via INTRAVENOUS
  Administered 2019-04-15: 10 mg via INTRAVENOUS

## 2019-04-15 SURGICAL SUPPLY — 38 items
ADH SKN CLS APL DERMABOND .7 (GAUZE/BANDAGES/DRESSINGS) ×1
APL PRP STRL LF DISP 70% ISPRP (MISCELLANEOUS) ×1
APL SKNCLS STERI-STRIP NONHPOA (GAUZE/BANDAGES/DRESSINGS)
BENZOIN TINCTURE PRP APPL 2/3 (GAUZE/BANDAGES/DRESSINGS) IMPLANT
BLADE SURG 15 STRL LF DISP TIS (BLADE) ×1 IMPLANT
BLADE SURG 15 STRL SS (BLADE) ×2
CHLORAPREP W/TINT 26 (MISCELLANEOUS) ×2 IMPLANT
COVER BACK TABLE REUSABLE LG (DRAPES) ×2 IMPLANT
COVER MAYO STAND REUSABLE (DRAPES) ×2 IMPLANT
COVER WAND RF STERILE (DRAPES) IMPLANT
DECANTER SPIKE VIAL GLASS SM (MISCELLANEOUS) ×1 IMPLANT
DERMABOND ADVANCED (GAUZE/BANDAGES/DRESSINGS) ×1
DERMABOND ADVANCED .7 DNX12 (GAUZE/BANDAGES/DRESSINGS) ×1 IMPLANT
DRAPE LAPAROTOMY 100X72 PEDS (DRAPES) ×2 IMPLANT
DRAPE UTILITY XL STRL (DRAPES) ×2 IMPLANT
ELECT REM PT RETURN 9FT ADLT (ELECTROSURGICAL)
ELECTRODE REM PT RTRN 9FT ADLT (ELECTROSURGICAL) ×1 IMPLANT
GLOVE BIOGEL PI IND STRL 6.5 (GLOVE) IMPLANT
GLOVE BIOGEL PI IND STRL 8 (GLOVE) ×1 IMPLANT
GLOVE BIOGEL PI INDICATOR 6.5 (GLOVE) ×1
GLOVE BIOGEL PI INDICATOR 8 (GLOVE) ×1
GLOVE ECLIPSE 6.5 STRL STRAW (GLOVE) ×1 IMPLANT
GLOVE ECLIPSE 8.0 STRL XLNG CF (GLOVE) ×2 IMPLANT
GLOVE EXAM NITRILE MD LF STRL (GLOVE) ×1 IMPLANT
GOWN STRL REUS W/ TWL LRG LVL3 (GOWN DISPOSABLE) ×2 IMPLANT
GOWN STRL REUS W/TWL LRG LVL3 (GOWN DISPOSABLE) ×4
NDL HYPO 25X1 1.5 SAFETY (NEEDLE) ×1 IMPLANT
NEEDLE HYPO 25X1 1.5 SAFETY (NEEDLE) ×2 IMPLANT
NS IRRIG 1000ML POUR BTL (IV SOLUTION) ×2 IMPLANT
PACK BASIN DAY SURGERY FS (CUSTOM PROCEDURE TRAY) ×2 IMPLANT
PENCIL BUTTON HOLSTER BLD 10FT (ELECTRODE) IMPLANT
SLEEVE SCD COMPRESS KNEE MED (MISCELLANEOUS) IMPLANT
SPONGE LAP 4X18 RFD (DISPOSABLE) ×2 IMPLANT
STRIP CLOSURE SKIN 1/2X4 (GAUZE/BANDAGES/DRESSINGS) IMPLANT
SUT MON AB 4-0 PC3 18 (SUTURE) ×2 IMPLANT
SUT VICRYL 3-0 CR8 SH (SUTURE) ×2 IMPLANT
SYR CONTROL 10ML LL (SYRINGE) ×2 IMPLANT
TOWEL GREEN STERILE FF (TOWEL DISPOSABLE) ×2 IMPLANT

## 2019-04-15 NOTE — Anesthesia Postprocedure Evaluation (Signed)
Anesthesia Post Note  Patient: [name] [name]  Procedure(s) Performed: REMOVAL PORT-A-CATH (Right Chest)     Patient location during evaluation: PACU Anesthesia Type: MAC Level of consciousness: awake and alert Pain management: pain level controlled Vital Signs Assessment: post-procedure vital signs reviewed and stable Respiratory status: spontaneous breathing, nonlabored ventilation, respiratory function stable and patient connected to nasal cannula oxygen Cardiovascular status: stable and blood pressure returned to baseline Postop Assessment: no apparent nausea or vomiting Anesthetic complications: no    Last Vitals:  Vitals:   04/15/19 0845 04/15/19 0900  BP:    Pulse: 84 80  Resp: 13 18  Temp:  36.6 C  SpO2: 99% 100%    Last Pain:  Vitals:   04/15/19 0900  TempSrc:   PainSc: 0-No pain                 Montez Hageman

## 2019-04-15 NOTE — Transfer of Care (Signed)
Immediate Anesthesia Transfer of Care Note  Patient: [name] [name]  Procedure(s) Performed: REMOVAL PORT-A-CATH (Right Chest)  Patient Location: PACU  Anesthesia Type:MAC  Level of Consciousness: awake, alert , oriented, drowsy and patient cooperative  Airway & Oxygen Therapy: Patient Spontanous Breathing and Patient connected to face mask oxygen  Post-op Assessment: Report given to RN and Post -op Vital signs reviewed and stable  Post vital signs: Reviewed and stable  Last Vitals:  Vitals Value Taken Time  BP 103/65 04/15/19 0758  Temp    Pulse 97 04/15/19 0759  Resp 10 04/15/19 0759  SpO2 100 % 04/15/19 0759  Vitals shown include unvalidated device data.  Last Pain:  Vitals:   04/15/19 0637  TempSrc: Oral  PainSc: 0-No pain      Patients Stated Pain Goal: 0 (15/86/82 5749)  Complications: No apparent anesthesia complications

## 2019-04-15 NOTE — Interval H&P Note (Signed)
History and Physical Interval Note:  04/15/2019 7:13 AM  [name] [name]  has presented today for surgery, with the diagnosis of port in place.  The various methods of treatment have been discussed with the patient and family. After consideration of risks, benefits and other options for treatment, the patient has consented to  Procedure(s) with comments: REMOVAL PORT-A-CATH (N/A) - MAC AND LOCAL ANESTHETIC as a surgical intervention.  The patient's history has been reviewed, patient examined, no change in status, stable for surgery.  I have reviewed the patient's chart and labs.  Questions were answered to the patient's satisfaction.     [place]

## 2019-04-15 NOTE — H&P (Signed)
[name] [name] is an 53 y.o. female.   Chief Complaint: port in place HPI: pt has completed chemotherapy and no longer requires her port.   Past Medical History:  Diagnosis Date  . Allergy    seasonal  . Anal fissure   . Complication of anesthesia   . Diverticulitis   . GERD (gastroesophageal reflux disease) 07/07/2005  . Hemorrhoid   . Hepatic steatosis   . Hypertension   . Internal hemorrhoids   . Malignant neoplasm of lower-inner quadrant of right breast of female, estrogen receptor positive ([place]) 07/16/2018  . PONV (postoperative nausea and vomiting)   . Positive H. pylori test     Past Surgical History:  Procedure Laterality Date  . BREAST LUMPECTOMY WITH RADIOACTIVE SEED AND SENTINEL LYMPH NODE BIOPSY Right 08/23/2018   Procedure: RIGHT BREAST LUMPECTOMY WITH RADIOACTIVE SEED AND RIGHT SENTINEL LYMPH NODE MAPPING;  Surgeon: Erroll Luna, MD;  Location: [facility];  Service: General;  Laterality: Right;  . COLON RESECTION  2000's Dr. Hassell Done   Diverticulitis.  . COLONOSCOPY    . FOOT SURGERY Left   . g3p2 c-section1    . KNEE SURGERY Right   . PORTACATH PLACEMENT Right 09/24/2018   Procedure: INSERTION PORT-A-CATH WITH ULTRASOUND;  Surgeon: Erroll Luna, MD;  Location: [facility];  Service: General;  Laterality: Right;  . TONSILLECTOMY    . TUBAL LIGATION      Family History  Problem Relation Age of Onset  . Hypertension Mother   . Hypertension Father   . Heart disease Father   . Diabetes Neg Hx   . COPD Neg Hx    Social History:  reports that she has never smoked. She has never used smokeless tobacco. She reports that she does not drink alcohol or use drugs.  Allergies:  Allergies  Allergen Reactions  . Ibuprofen Swelling    SWELLING REACTION UNSPECIFIED   . Lisinopril Swelling    angioedema  . Tape   . Dilaudid [Hydromorphone Hcl] Nausea Only    Medications Prior to Admission  Medication Sig Dispense Refill  . clobetasol ointment  (TEMOVATE) [id number] % Apply 1 application topically 2 (two) times daily. Then as needed for 30 days  1  . Flaxseed, Linseed, (FLAX SEED OIL PO) Take 5 mLs by mouth every morning.    . gabapentin (NEURONTIN) 300 MG capsule Take 1 capsule (300 mg total) by mouth 3 (three) times daily. 270 capsule 0  . loratadine (CLARITIN) 10 MG tablet Take 10 mg by mouth daily.    . naproxen sodium (ALEVE) 220 MG tablet Take 660 mg by mouth daily as needed (pain).    . Olmesartan-amLODIPine-HCTZ 40-10-25 MG TABS Take 1 tablet by mouth daily.    Marland Kitchen omeprazole (PRILOSEC) 40 MG capsule TAKE 1 CAPSULE(40 MG) BY MOUTH DAILY 30 capsule 2  . promethazine (PHENERGAN) 25 MG tablet Take 1 tablet (25 mg total) by mouth every 6 (six) hours as needed for nausea. 30 tablet 3  . tetrahydrozoline-zinc (VISINE-AC) 0.05-0.25 % ophthalmic solution Place 2 drops into both eyes as needed (dry, itchy eyes).    Marland Kitchen anastrozole (ARIMIDEX) 1 MG tablet Take 1 tablet (1 mg total) by mouth daily. 90 tablet 3  . Cholecalciferol (VITAMIN D3) 5000 UNITS TABS Take 1 tablet by mouth daily.      No results found for this or any previous visit (from the past 48 hour(s)). No results found.  Review of Systems  All other systems reviewed and are negative.  Blood pressure 113/69, pulse (!) 102, temperature 98.5 F (36.9 C), temperature source Oral, resp. rate 18, height 5\' 6"  (1.676 m), weight 118.2 kg, last menstrual period 09/13/2015, SpO2 100 %. Physical Exam  Constitutional: She appears well-developed.  HENT:  Head: Normocephalic.  Eyes: Pupils are equal, round, and reactive to light.  Respiratory:  Port  Right chest   GI: Soft.  Musculoskeletal: Normal range of motion.  Neurological: She is alert.  Skin: Skin is warm.  Psychiatric: She has a normal mood and affect.     Assessment/Plan Port in place  Mila Doce removal The procedure has been discussed with the patient.  Alternative therapies have been discussed with the patient.  Operative  risks include bleeding,  Infection,  Organ injury,  Nerve injury,  Blood vessel injury,  DVT,  Pulmonary embolism,  Death,  And possible reoperation.  Medical management risks include worsening of present situation.  The success of the procedure is 50 -90 % at treating patients symptoms.  The patient understands and agrees to proceed.  Turner Daniels, MD 04/15/2019, 7:12 AM

## 2019-04-15 NOTE — Anesthesia Preprocedure Evaluation (Signed)
Anesthesia Evaluation  Patient identified by MRN, date of birth, ID band Patient awake    Reviewed: Allergy & Precautions, NPO status , Patient's Chart, lab work & pertinent test results  History of Anesthesia Complications (+) PONV  Airway Mallampati: II  TM Distance: >3 FB Neck ROM: Full    Dental  (+) Missing, Dental Advisory Given   Pulmonary neg pulmonary ROS,    breath sounds clear to auscultation       Cardiovascular hypertension, Pt. on medications (-) angina Rhythm:Regular Rate:Normal  09/13/2018 ECHO: EF 60-65%, valves OK   Neuro/Psych negative neurological ROS     GI/Hepatic Neg liver ROS, GERD  Controlled,  Endo/Other  Morbid obesity  Renal/GU negative Renal ROS     Musculoskeletal  (+) Arthritis ,   Abdominal (+) + obese,   Peds  Hematology negative hematology ROS (+)   Anesthesia Other Findings Breast cancer  Reproductive/Obstetrics S/p BTL                             Anesthesia Physical  Anesthesia Plan  ASA: III  Anesthesia Plan: MAC   Post-op Pain Management:    Induction: Intravenous  PONV Risk Score and Plan: 3 and Ondansetron and Dexamethasone  Airway Management Planned: Nasal Cannula and Simple Face Mask  Additional Equipment:   Intra-op Plan:   Post-operative Plan:   Informed Consent: I have reviewed the patients History and Physical, chart, labs and discussed the procedure including the risks, benefits and alternatives for the proposed anesthesia with the patient or authorized representative who has indicated his/her understanding and acceptance.     Dental advisory given  Plan Discussed with: CRNA and Surgeon  Anesthesia Plan Comments:         Anesthesia Quick Evaluation

## 2019-04-15 NOTE — Op Note (Signed)
Preop diagnosis: Indwelling port a catheter for chemotherapy  Postop diagnosis: Same  Procedure: Removal of port a catheter  Surgeon: Jayda White M.D.  Anesthesia: MAC with local  EBL: Minimal  Specimen none  Drains: None  Indications for procedure: The patient presents for removal of port a catheter after completing chemotherapy. The patient no longer requires central venous access. Risks of bleeding, infection, catheter fragmentation, embolization, arrhythmias and damage to arteries, veins and nerves and possibly other mediastinal structures discussed. The patient agrees to proceed.  Description of procedure: The patient was seen in the holding area. Questions were answered. The patient agreed to proceed. The patient was taken to the operating room. The patient was placed supine. Anesthesia was initiated. The skin on the upper chest was prepped and draped in a sterile fashion. Timeout was done. The patient received preoperative antibiotics. Incision was made through the old port site and the hub of the Port-A-Cath was seen. The sutures were cut to release the port from the chest wall. The catheter was removed in its entirety without difficulty. The tract was closed with 3-0 Vicryl. 4 Monocryl was used to close the skin. All final counts were correct. The patient was taken to recovery in satisfactory condition.  

## 2019-04-15 NOTE — Discharge Instructions (Signed)
GENERAL SURGERY: POST OP INSTRUCTIONS  ######################################################################  EAT Gradually transition to a high fiber diet with a fiber supplement over the next few weeks after discharge.  Start with a pureed / full liquid diet (see below)  WALK Walk an hour a day.  Control your pain to do that.    CONTROL PAIN Control pain so that you can walk, sleep, tolerate sneezing/coughing, go up/down stairs.  HAVE A BOWEL MOVEMENT DAILY Keep your bowels regular to avoid problems.  OK to try a laxative to override constipation.  OK to use an antidairrheal to slow down diarrhea.  Call if not better after 2 tries  CALL IF YOU HAVE PROBLEMS/CONCERNS Call if you are still struggling despite following these instructions. Call if you have concerns not answered by these instructions  ######################################################################    1. DIET: Follow a light bland diet & liquids the first 24 hours after arrival home, such as soup, liquids, starches, etc.  Be sure to drink plenty of fluids.  Quickly advance to a usual solid diet within a few days.  Avoid fast food or heavy meals as your are more likely to get nauseated or have irregular bowels.  A low-fat, high-fiber diet for the rest of your life is ideal.   2. Take your usually prescribed home medications unless otherwise directed. 3. PAIN CONTROL: a. Pain is best controlled by a usual combination of three different methods TOGETHER: i. Ice/Heat ii. Over the counter pain medication iii. Prescription pain medication b. Most patients will experience some swelling and bruising around the incisions.  Ice packs or heating pads (30-60 minutes up to 6 times a day) will help. Use ice for the first few days to help decrease swelling and bruising, then switch to heat to help relax tight/sore spots and speed recovery.  Some people prefer to use ice alone, heat alone, alternating between ice & heat.   Experiment to what works for you.  Swelling and bruising can take several weeks to resolve.   c. It is helpful to take an over-the-counter pain medication regularly for the first few weeks.  Choose one of the following that works best for you: i. Naproxen (Aleve, etc)  Two 220mg tabs twice a day ii. Ibuprofen (Advil, etc) Three 200mg tabs four times a day (every meal & bedtime) iii. Acetaminophen (Tylenol, etc) 500-650mg four times a day (every meal & bedtime) d. A  prescription for pain medication (such as oxycodone, hydrocodone, etc) should be given to you upon discharge.  Take your pain medication as prescribed.  i. If you are having problems/concerns with the prescription medicine (does not control pain, nausea, vomiting, rash, itching, etc), please call us [phone] to see if we need to switch you to a different pain medicine that will work better for you and/or control your side effect better. ii. If you need a refill on your pain medication, please contact your pharmacy.  They will contact our office to request authorization. Prescriptions will not be filled after 5 pm or on week-ends. 4. Avoid getting constipated.  Between the surgery and the pain medications, it is common to experience some constipation.  Increasing fluid intake and taking a fiber supplement (such as Metamucil, Citrucel, FiberCon, MiraLax, etc) 1-2 times a day regularly will usually help prevent this problem from occurring.  A mild laxative (prune juice, Milk of Magnesia, MiraLax, etc) should be taken according to package directions if there are no bowel movements after 48 hours.   5. Wash /   shower every day.  You may shower over the dressings as they are waterproof.  Continue to shower over incision(s) after the dressing is off. 6. Remove your waterproof bandages 5 days after surgery.  You may leave the incision open to air.  You may have skin tapes (Steri Strips) covering the incision(s).  Leave them on until one week, then  remove.  You may replace a dressing/Band-Aid to cover the incision for comfort if you wish.      7. ACTIVITIES as tolerated:   a. You may resume regular (light) daily activities beginning the next day--such as daily self-care, walking, climbing stairs--gradually increasing activities as tolerated.  If you can walk 30 minutes without difficulty, it is safe to try more intense activity such as jogging, treadmill, bicycling, low-impact aerobics, swimming, etc. b. Save the most intensive and strenuous activity for last such as sit-ups, heavy lifting, contact sports, etc  Refrain from any heavy lifting or straining until you are off narcotics for pain control.   c. DO NOT PUSH THROUGH PAIN.  Let pain be your guide: If it hurts to do something, don't do it.  Pain is your body warning you to avoid that activity for another week until the pain goes down. d. You may drive when you are no longer taking prescription pain medication, you can comfortably wear a seatbelt, and you can safely maneuver your car and apply brakes. e. You may have sexual intercourse when it is comfortable.  8. FOLLOW UP in our office a. Please call [facility] at [phone] to set up an appointment to see your surgeon in the office for a follow-up appointment approximately 2-3 weeks after your surgery. b. Make sure that you call for this appointment the day you arrive home to insure a convenient appointment time. 9. IF YOU HAVE DISABILITY OR FAMILY LEAVE FORMS, BRING THEM TO THE OFFICE FOR PROCESSING.  DO NOT GIVE THEM TO YOUR DOCTOR.   WHEN TO CALL US [phone]: 1. Poor pain control 2. Reactions / problems with new medications (rash/itching, nausea, etc)  3. Fever over 101.5 F (38.5 C) 4. Worsening swelling or bruising 5. Continued bleeding from incision. 6. Increased pain, redness, or drainage from the incision 7. Difficulty breathing / swallowing   The clinic staff is available to answer your questions during regular  business hours (8:30am-5pm).  Please don't hesitate to call and ask to speak to one of our nurses for clinical concerns.   If you have a medical emergency, go to the nearest emergency room or call 911.  A surgeon from [facility] is always on call at the hospitals   [facility] [address], [address], [place], [place]  [address] ? MAIN: [phone] ? TOLL FREE: [phone] ?  FAX [phone] [email or web address]    Post Anesthesia Home Care Instructions  Activity: Get plenty of rest for the remainder of the day. A responsible individual must stay with you for 24 hours following the procedure.  For the next 24 hours, DO NOT: -Drive a car -Operate machinery -Drink alcoholic beverages -Take any medication unless instructed by your physician -Make any legal decisions or sign important papers.  Meals: Start with liquid foods such as gelatin or soup. Progress to regular foods as tolerated. Avoid greasy, spicy, heavy foods. If nausea and/or vomiting occur, drink only clear liquids until the nausea and/or vomiting subsides. Call your physician if vomiting continues.  Special Instructions/Symptoms: Your throat may feel dry or   sore from the anesthesia or the breathing tube placed in your throat during surgery. If this causes discomfort, gargle with warm salt water. The discomfort should disappear within 24 hours.  If you had a scopolamine patch placed behind your ear for the management of post- operative nausea and/or vomiting:  1. The medication in the patch is effective for 72 hours, after which it should be removed.  Wrap patch in a tissue and discard in the trash. Wash hands thoroughly with soap and water. 2. You may remove the patch earlier than 72 hours if you experience unpleasant side effects which may include dry mouth, dizziness or visual disturbances. 3. Avoid touching the patch. Wash your hands with soap and water after  contact with the patch.     

## 2019-04-16 ENCOUNTER — Encounter (HOSPITAL_BASED_OUTPATIENT_CLINIC_OR_DEPARTMENT_OTHER): Payer: Self-pay | Admitting: Surgery

## 2019-04-18 ENCOUNTER — Other Ambulatory Visit: Payer: Self-pay | Admitting: Hematology and Oncology

## 2019-05-01 ENCOUNTER — Telehealth: Payer: Self-pay | Admitting: Radiation Oncology

## 2019-05-01 NOTE — Telephone Encounter (Signed)
  Radiation Oncology         [phone]) [phone] ________________________________  Name: [name] [name] MRN: [id number]  Date of Service: 05/01/2019  DOB: 10-26-1965  Post Treatment Telephone Note  Diagnosis:   Stage IA, pT1bN0M0 grade 3 ER positive invasive ductal carcinoma of the right breast.  Interval Since Last Radiation:  3 weeks   02/19/2019-04/07/2019:  The right breast was treated to 50.4 Gy in 28 fractions followed by a 10 Gy boost in 5 fractions to the lumpectomy cavity.   Narrative:  The patient was contacted today for routine follow-up. During treatment she did very well with radiotherapy and did not have significant desquamation. She reports she is feeling well and reports her skin is almost back to normal color. She had her PAC removed on 04/15/2019 and this appears to be healing well also.  Impression/Plan: 1.  Stage IA, pT1bN0M0 grade 3 ER positive invasive ductal carcinoma of the right breast. The patient has been doing well since completion of radiotherapy. We discussed that we would be happy to continue to follow her as needed, but she will also continue to follow up with Dr. Lindi Adie in medical oncology. She was counseled on skin care as well as measures to avoid sun exposure to this area.  2. Survivorship. We discussed the importance of survivorship evaluation and she will follow up in November in the survivorship clinic.     Carola Rhine, PAC

## 2019-05-14 ENCOUNTER — Other Ambulatory Visit: Payer: Self-pay | Admitting: Hematology and Oncology

## 2019-05-14 DIAGNOSIS — Z170 Estrogen receptor positive status [ER+]: Secondary | ICD-10-CM

## 2019-05-14 DIAGNOSIS — C50311 Malignant neoplasm of lower-inner quadrant of right female breast: Secondary | ICD-10-CM

## 2019-05-14 NOTE — Progress Notes (Signed)
  Radiation Oncology         [phone]) [phone] ________________________________  Name: [name] [name] MRN: [id number]  Date: 04/07/2019  DOB: [birth year]  End of Treatment Note  Diagnosis:   right-sided breast cancer     Indication for treatment:  Curative       Radiation treatment dates:   02/19/19 - 04/07/19  Site/dose:   The patient initially received a dose of 50.4 Gy in 28 fractions to the breast using whole-breast tangent fields. This was delivered using a 3-D conformal technique. The patient then received a boost to the seroma. This delivered an additional 10 Gy in 5 fractions using a 3-field photon boost technique. The total dose was 60.4 Gy.  Narrative: The patient tolerated radiation treatment relatively well.   The patient had some expected skin irritation as she progressed during treatment. Moist desquamation was not present at the end of treatment.  Plan: The patient has completed radiation treatment. The patient will return to radiation oncology clinic for routine followup in one month. I advised the patient to call or return sooner if they have any questions or concerns related to their recovery or treatment. ________________________________  Jodelle Gross, M.D., Ph.D.

## 2019-05-23 ENCOUNTER — Telehealth: Payer: Self-pay

## 2019-05-23 ENCOUNTER — Other Ambulatory Visit: Payer: Self-pay

## 2019-05-23 ENCOUNTER — Inpatient Hospital Stay: Payer: BC Managed Care – PPO

## 2019-05-23 ENCOUNTER — Inpatient Hospital Stay: Payer: BC Managed Care – PPO | Attending: Hematology and Oncology | Admitting: Medical

## 2019-05-23 VITALS — BP 124/72 | HR 86 | Temp 98.2°F | Resp 17

## 2019-05-23 DIAGNOSIS — Z170 Estrogen receptor positive status [ER+]: Secondary | ICD-10-CM | POA: Insufficient documentation

## 2019-05-23 DIAGNOSIS — C50311 Malignant neoplasm of lower-inner quadrant of right female breast: Secondary | ICD-10-CM | POA: Diagnosis present

## 2019-05-23 DIAGNOSIS — L03313 Cellulitis of chest wall: Secondary | ICD-10-CM | POA: Diagnosis not present

## 2019-05-23 LAB — CBC WITH DIFFERENTIAL (CANCER CENTER ONLY)
Abs Immature Granulocytes: 0.01 10*3/uL (ref 0.00–0.07)
Basophils Absolute: 0 10*3/uL (ref 0.0–0.1)
Basophils Relative: 0 %
Eosinophils Absolute: 0.1 10*3/uL (ref 0.0–0.5)
Eosinophils Relative: 1 %
HCT: 37.6 % (ref 36.0–46.0)
Hemoglobin: 12.2 g/dL (ref 12.0–15.0)
Immature Granulocytes: 0 %
Lymphocytes Relative: 20 %
Lymphs Abs: 0.9 10*3/uL (ref 0.7–4.0)
MCH: 29 pg (ref 26.0–34.0)
MCHC: 32.4 g/dL (ref 30.0–36.0)
MCV: 89.3 fL (ref 80.0–100.0)
Monocytes Absolute: 0.4 10*3/uL (ref 0.1–1.0)
Monocytes Relative: 9 %
Neutro Abs: 3.1 10*3/uL (ref 1.7–7.7)
Neutrophils Relative %: 70 %
Platelet Count: 133 10*3/uL — ABNORMAL LOW (ref 150–400)
RBC: 4.21 MIL/uL (ref 3.87–5.11)
RDW: 13.8 % (ref 11.5–15.5)
WBC Count: 4.5 10*3/uL (ref 4.0–10.5)
nRBC: 0 % (ref 0.0–0.2)

## 2019-05-23 MED ORDER — CEPHALEXIN 500 MG PO CAPS: 500.0000 mg | ORAL_CAPSULE | Freq: Four times a day (QID) | ORAL | 0 refills | Status: AC

## 2019-05-23 NOTE — Progress Notes (Signed)
Pt presents with R breast tenderness, heat, edema, and redness x1 wk.  R nipple appears flatter/more inverted than L nipple.  Denies recent injury.  Denies discharge or bleeding from nipple.  No lump palpated with brief exam.

## 2019-05-23 NOTE — Patient Instructions (Signed)
COVID-19: How to Protect Yourself and Others Know how it spreads  There is currently no vaccine to prevent coronavirus disease 2019 (COVID-19).  The best way to prevent illness is to avoid being exposed to this virus.  The virus is thought to spread mainly from person-to-person. ? Between people who are in close contact with one another (within about 6 feet). ? Through respiratory droplets produced when an infected person coughs, sneezes or talks. ? These droplets can land in the mouths or noses of people who are nearby or possibly be inhaled into the lungs. ? Some recent studies have suggested that COVID-19 may be spread by people who are not showing symptoms. Everyone should Clean your hands often  Wash your hands often with soap and water for at least 20 seconds especially after you have been in a public place, or after blowing your nose, coughing, or sneezing.  If soap and water are not readily available, use a hand sanitizer that contains at least 60% alcohol. Cover all surfaces of your hands and rub them together until they feel dry.  Avoid touching your eyes, nose, and mouth with unwashed hands. Avoid close contact  Stay home if you are sick.  Avoid close contact with people who are sick.  Put distance between yourself and other people. ? Remember that some people without symptoms may be able to spread virus. ? This is especially important for people who are at higher risk of getting very sick.www.cdc.gov/coronavirus/2019-ncov/need-extra-precautions/people-at-higher-risk.html Cover your mouth and nose with a cloth face cover when around others  You could spread COVID-19 to others even if you do not feel sick.  Everyone should wear a cloth face cover when they have to go out in public, for example to the grocery store or to pick up other necessities. ? Cloth face coverings should not be placed on young children under age 2, anyone who has trouble breathing, or is unconscious,  incapacitated or otherwise unable to remove the mask without assistance.  The cloth face cover is meant to protect other people in case you are infected.  Do NOT use a facemask meant for a healthcare worker.  Continue to keep about 6 feet between yourself and others. The cloth face cover is not a substitute for social distancing. Cover coughs and sneezes  If you are in a private setting and do not have on your cloth face covering, remember to always cover your mouth and nose with a tissue when you cough or sneeze or use the inside of your elbow.  Throw used tissues in the trash.  Immediately wash your hands with soap and water for at least 20 seconds. If soap and water are not readily available, clean your hands with a hand sanitizer that contains at least 60% alcohol. Clean and disinfect  Clean AND disinfect frequently touched surfaces daily. This includes tables, doorknobs, light switches, countertops, handles, desks, phones, keyboards, toilets, faucets, and sinks. www.cdc.gov/coronavirus/2019-ncov/prevent-getting-sick/disinfecting-your-home.html  If surfaces are dirty, clean them: Use detergent or soap and water prior to disinfection.  Then, use a household disinfectant. You can see a list of EPA-registered household disinfectants here. cdc.gov/coronavirus 12/24/2018 This information is not intended to replace advice given to you by your health care provider. Make sure you discuss any questions you have with your health care provider. Document Released: 12/03/2018 Document Revised: 01/01/2019 Document Reviewed: 12/03/2018 Elsevier Patient Education  2020 Elsevier Inc.  

## 2019-05-23 NOTE — Progress Notes (Signed)
Symptoms Management Clinic Progress Note   [name] [name] [id number] August 13, 1966 53 y.o.  [name] [name] is managed by Dr. Nicholas Lose  Actively treated with chemotherapy/immunotherapy/hormonal therapy: yes  Current therapy: Anastrozole  Next scheduled appointment with provider: 07/18/2019  Assessment: Plan:    Malignant neoplasm of lower-inner quadrant of right breast of female, estrogen receptor positive ([place])  Cellulitis of chest wall   ER positive malignant neoplasm of the right breast now with warmth, tenderness, and erythema: The patient has been referred for a diagnostic mammogram.  Additionally she was placed on Keflex 500 mg p.o. 4 times daily x7 days.  She was instructed to use Tylenol or Motrin for pain.  Please see After Visit Summary for patient specific instructions.  Future Appointments  Date Time Provider [place]  05/30/2019 11:00 AM Patwardhan, Reynold Bowen, MD [facility] None  07/18/2019  2:00 PM Causey, Charlestine Massed, NP [facility] None    No orders of the defined types were placed in this encounter.      Subjective:   Patient ID:  [name] [name] is a 53 y.o. (DOB 09-Mar-1966) female.  Chief Complaint:  Chief Complaint  Patient presents with  . Breast Pain    Right    HPI [name] [name] Is a 53 y.o. female with a diagnosis of an ER positive malignant neoplasm of the right breast.  She is followed by Dr. Nicholas Lose and continues on anastrozole.  She presents to the clinic today with a less than 1 week history of right breast tenderness, swelling, erythema, and warmth.  She denies any injuries or changes in activity.  She has had no fevers, chills, or sweats.  She has previously had mammograms completed at [facility].  Medications: I have reviewed the patient's current medications.  Allergies:  Allergies  Allergen Reactions  . Ibuprofen Swelling    SWELLING REACTION UNSPECIFIED    . Lisinopril Swelling    angioedema  . Dilaudid [Hydromorphone Hcl] Nausea Only  . Tape Rash    Past Medical History:  Diagnosis Date  . Allergy    seasonal  . Anal fissure   . Complication of anesthesia   . Diverticulitis   . GERD (gastroesophageal reflux disease) 07/07/2005  . Hemorrhoid   . Hepatic steatosis   . Hypertension   . Internal hemorrhoids   . Malignant neoplasm of lower-inner quadrant of right breast of female, estrogen receptor positive ([place]) 07/16/2018  . PONV (postoperative nausea and vomiting)   . Positive H. pylori test     Past Surgical History:  Procedure Laterality Date  . BREAST LUMPECTOMY WITH RADIOACTIVE SEED AND SENTINEL LYMPH NODE BIOPSY Right 08/23/2018   Procedure: RIGHT BREAST LUMPECTOMY WITH RADIOACTIVE SEED AND RIGHT SENTINEL LYMPH NODE MAPPING;  Surgeon: Erroll Luna, MD;  Location: [facility];  Service: General;  Laterality: Right;  . COLON RESECTION  2000's Dr. Hassell Done   Diverticulitis.  . COLONOSCOPY    . FOOT SURGERY Left   . g3p2 c-section1    . KNEE SURGERY Right   . PORT-A-CATH REMOVAL Right 04/15/2019   Procedure: REMOVAL PORT-A-CATH;  Surgeon: Erroll Luna, MD;  Location: [facility];  Service: General;  Laterality: Right;  MAC AND LOCAL ANESTHETIC  . PORTACATH PLACEMENT Right 09/24/2018   Procedure: INSERTION PORT-A-CATH WITH ULTRASOUND;  Surgeon: Erroll Luna, MD;  Location: [facility];  Service: General;  Laterality: Right;  . TONSILLECTOMY    . TUBAL LIGATION  Family History  Problem Relation Age of Onset  . Hypertension Mother   . Hypertension Father   . Heart disease Father   . Diabetes Neg Hx   . COPD Neg Hx     Social History   Socioeconomic History  . Marital status: Divorced    Spouse name: Not on file  . Number of children: 3  . Years of education: 51  . Highest education level: Not on file  Occupational History    Employer: NOT EMPLOYED  Social Needs  . Financial resource  strain: Not on file  . Food insecurity    Worry: Not on file    Inability: Not on file  . Transportation needs    Medical: Not on file    Non-medical: Not on file  Tobacco Use  . Smoking status: Never Smoker  . Smokeless tobacco: Never Used  Substance and Sexual Activity  . Alcohol use: No    Alcohol/week: 0.0 standard drinks  . Drug use: No  . Sexual activity: Never    Birth control/protection: Post-menopausal    Comment: BTL  Lifestyle  . Physical activity    Days per week: Not on file    Minutes per session: Not on file  . Stress: Not on file  Relationships  . Social Herbalist on phone: Not on file    Gets together: Not on file    Attends religious service: Not on file    Active member of club or organization: Not on file    Attends meetings of clubs or organizations: Not on file    Relationship status: Not on file  . Intimate partner violence    Fear of current or ex partner: Not on file    Emotionally abused: Not on file    Physically abused: Not on file    Forced sexual activity: Not on file  Other Topics Concern  . Not on file  Social History Narrative   [employer], [employer] - Human service/sociology. Married '2000 - 29yr/seperated. 2 boys - '82, '87, 1 dtr - '81.   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     Past Medical History, Surgical history, Social history, and Family history were reviewed and updated as appropriate.   Please see review of systems for further details on the patient's review from today.   Review of Systems:  Review of Systems  Constitutional: Negative for chills, diaphoresis and fever.  Skin: Positive for color change.       Erythema, warmth, swelling, and tenderness of the right breast.    Objective:   Physical Exam:  BP 124/72 (BP Location: Left Arm, Patient Position: Sitting)   Pulse 86   Temp 98.2 F (36.8 C) (Oral)   Resp 17   LMP 09/13/2015 Comment: celebate, BTL  SpO2 100%  ECOG: 0  Physical Exam Constitutional:       General: She is not in acute distress.    Appearance: She is not diaphoretic.  HENT:     Head: Normocephalic and atraumatic.  Pulmonary:     Breath sounds: No stridor. No wheezing.  Lymphadenopathy:     Upper Body:     Right upper body: Axillary adenopathy present. No pectoral adenopathy.  Skin:    General: Skin is warm and dry.     Findings: Erythema present. No rash.     Comments: The right breast is tender, erythematous, and warm to the touch.  There appears to be some peau d'orange.  There also  appears to be slight retraction of the right nipple.  No discharge is appreciated.  Neurological:     Mental Status: She is alert.     Coordination: Coordination normal.     Gait: Gait normal.  Psychiatric:        Behavior: Behavior normal.        Thought Content: Thought content normal.        Judgment: Judgment normal.     Lab Review:     Component Value Date/Time   NA 139 04/09/2019 1615   K 3.2 (L) 04/09/2019 1615   CL 102 04/09/2019 1615   CO2 25 04/09/2019 1615   GLUCOSE 98 04/09/2019 1615   BUN 11 04/09/2019 1615   CREATININE 0.94 04/09/2019 1615   CREATININE 0.89 01/29/2019 1010   CALCIUM 9.3 04/09/2019 1615   PROT 7.1 01/29/2019 1010   ALBUMIN 4.0 01/29/2019 1010   AST 24 01/29/2019 1010   ALT 26 01/29/2019 1010   ALKPHOS 79 01/29/2019 1010   BILITOT 0.3 01/29/2019 1010   GFRNONAA >60 04/09/2019 1615   GFRNONAA >60 01/29/2019 1010   GFRAA >60 04/09/2019 1615   GFRAA >60 01/29/2019 1010       Component Value Date/Time   WBC 4.5 05/23/2019 1456   WBC 4.9 08/16/2018 0945   RBC 4.21 05/23/2019 1456   HGB 12.2 05/23/2019 1456   HCT 37.6 05/23/2019 1456   PLT 133 (L) 05/23/2019 1456   MCV 89.3 05/23/2019 1456   MCH 29.0 05/23/2019 1456   MCHC 32.4 05/23/2019 1456   RDW 13.8 05/23/2019 1456   LYMPHSABS 0.9 05/23/2019 1456   MONOABS 0.4 05/23/2019 1456   EOSABS 0.1 05/23/2019 1456   BASOSABS 0.0 05/23/2019 1456   -------------------------------  Imaging  from last 24 hours (if applicable):  Radiology interpretation: No results found.

## 2019-05-23 NOTE — Telephone Encounter (Signed)
Pt reports right breast swelling, pain, and feeling that is warm to the touch X 1 week.  Pt denies fever, any recent trauma/injury. No discoloration or drainage from right breast per patient.    Pt with right breast lumpectomy, adjuvant chemotherapy, and completed radiation on 04/07/2019.   Sandi Mealy, PA-C will assess patient today in symptom management.  Pt aware, no further needs.

## 2019-05-26 ENCOUNTER — Other Ambulatory Visit: Payer: Self-pay | Admitting: Medical

## 2019-05-26 DIAGNOSIS — Z170 Estrogen receptor positive status [ER+]: Secondary | ICD-10-CM

## 2019-05-26 DIAGNOSIS — L03313 Cellulitis of chest wall: Secondary | ICD-10-CM

## 2019-05-26 DIAGNOSIS — C50311 Malignant neoplasm of lower-inner quadrant of right female breast: Secondary | ICD-10-CM

## 2019-05-27 ENCOUNTER — Ambulatory Visit
Admission: RE | Admit: 2019-05-27 | Discharge: 2019-05-27 | Disposition: A | Discharge status: Home / Self Care | Payer: BC Managed Care – PPO | Source: Ambulatory Visit | Attending: Medical | Admitting: Medical

## 2019-05-27 ENCOUNTER — Ambulatory Visit: Payer: BC Managed Care – PPO

## 2019-05-27 ENCOUNTER — Other Ambulatory Visit: Payer: Self-pay

## 2019-05-27 DIAGNOSIS — C50311 Malignant neoplasm of lower-inner quadrant of right female breast: Secondary | ICD-10-CM

## 2019-05-27 DIAGNOSIS — L03313 Cellulitis of chest wall: Secondary | ICD-10-CM

## 2019-05-27 DIAGNOSIS — Z170 Estrogen receptor positive status [ER+]: Secondary | ICD-10-CM

## 2019-05-27 HISTORY — DX: Personal history of antineoplastic chemotherapy: Z92.21

## 2019-05-27 HISTORY — DX: Personal history of irradiation: Z92.3

## 2019-05-27 NOTE — Progress Notes (Signed)
These preliminary result these preliminary results were noted.  Awaiting final report.

## 2019-05-30 ENCOUNTER — Encounter: Payer: Self-pay | Admitting: Cardiology

## 2019-05-30 ENCOUNTER — Ambulatory Visit (INDEPENDENT_AMBULATORY_CARE_PROVIDER_SITE_OTHER): Payer: BC Managed Care – PPO | Admitting: Cardiology

## 2019-05-30 ENCOUNTER — Other Ambulatory Visit: Payer: Self-pay

## 2019-05-30 VITALS — BP 141/89 | HR 96 | Temp 97.1°F | Ht 66.0 in | Wt 257.0 lb

## 2019-05-30 DIAGNOSIS — I10 Essential (primary) hypertension: Secondary | ICD-10-CM | POA: Diagnosis not present

## 2019-05-30 DIAGNOSIS — R600 Localized edema: Secondary | ICD-10-CM | POA: Diagnosis not present

## 2019-05-30 MED ORDER — SPIRONOLACTONE 50 MG PO TABS: 50.0000 mg | ORAL_TABLET | Freq: Every day | ORAL | 0 refills | Status: DC

## 2019-05-30 NOTE — Progress Notes (Signed)
Patient referred by Leonides Sake, MD for leg edema  Subjective:   [name] [name], female    DOB: 1965-09-21, 53 y.o.   MRN: [id number]   Chief Complaint  Patient presents with  . Leg Swelling  . New Patient (Initial Visit)  . Hypertension     HPI  53 y.o. African American female with hypertension, migraines, h/o invasive ductal carcinoma s/p Rt lumpectomy, chemotherapy and radiation.  Patient has referred due to leg edema and hypertension. Her echocardiogram prior to starting chemotherapy showed normal EF and normal GLS. Chemotherapy with adriamycin and cytoxan was stopped early due to neuropathy.  She works at [employer], walks about 5000 steps/day at work. She is currently taking amlodipine-olmesartan 10-40 mg and lasix 20 mg daily. Edema has resolved. She denies any shortness of breath, orthopnea, PND.   Past Medical History:  Diagnosis Date  . Allergy    seasonal  . Anal fissure   . Complication of anesthesia   . Diverticulitis   . GERD (gastroesophageal reflux disease) 07/07/2005  . Hemorrhoid   . Hepatic steatosis   . Hypertension   . Internal hemorrhoids   . Malignant neoplasm of lower-inner quadrant of right breast of female, estrogen receptor positive ([place]) 07/16/2018  . Personal history of chemotherapy   . Personal history of radiation therapy   . PONV (postoperative nausea and vomiting)   . Positive H. pylori test      Past Surgical History:  Procedure Laterality Date  . BREAST LUMPECTOMY Right 08/23/2018   Invasive Ductal   . BREAST LUMPECTOMY WITH RADIOACTIVE SEED AND SENTINEL LYMPH NODE BIOPSY Right 08/23/2018   Procedure: RIGHT BREAST LUMPECTOMY WITH RADIOACTIVE SEED AND RIGHT SENTINEL LYMPH NODE MAPPING;  Surgeon: Erroll Luna, MD;  Location: [place];  Service: General;  Laterality: Right;  . COLON RESECTION  2000's Dr. Hassell Done   Diverticulitis.  . COLONOSCOPY    . FOOT SURGERY Left   . g3p2 c-section1    . KNEE SURGERY Right    . PORT-A-CATH REMOVAL Right 04/15/2019   Procedure: REMOVAL PORT-A-CATH;  Surgeon: Erroll Luna, MD;  Location: [place];  Service: General;  Laterality: Right;  MAC AND LOCAL ANESTHETIC  . PORTACATH PLACEMENT Right 09/24/2018   Procedure: INSERTION PORT-A-CATH WITH ULTRASOUND;  Surgeon: Erroll Luna, MD;  Location: [place];  Service: General;  Laterality: Right;  . TONSILLECTOMY    . TUBAL LIGATION       Social History   Socioeconomic History  . Marital status: Divorced    Spouse name: Not on file  . Number of children: 3  . Years of education: 28  . Highest education level: Not on file  Occupational History    Employer: NOT EMPLOYED  Social Needs  . Financial resource strain: Not on file  . Food insecurity    Worry: Not on file    Inability: Not on file  . Transportation needs    Medical: Not on file    Non-medical: Not on file  Tobacco Use  . Smoking status: Never Smoker  . Smokeless tobacco: Never Used  Substance and Sexual Activity  . Alcohol use: No    Alcohol/week: 0.0 standard drinks  . Drug use: No  . Sexual activity: Never    Birth control/protection: Post-menopausal    Comment: BTL  Lifestyle  . Physical activity    Days per week: Not on file    Minutes per session: Not on file  . Stress:  Not on file  Relationships  . Social Herbalist on phone: Not on file    Gets together: Not on file    Attends religious service: Not on file    Active member of club or organization: Not on file    Attends meetings of clubs or organizations: Not on file    Relationship status: Not on file  . Intimate partner violence    Fear of current or ex partner: Not on file    Emotionally abused: Not on file    Physically abused: Not on file    Forced sexual activity: Not on file  Other Topics Concern  . Not on file  Social History Narrative   [employer], [employer] - Human service/sociology. Married '2000 - 13yr/seperated. 2 boys - '82,  '87, 1 dtr - '81.   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      Family History  Problem Relation Age of Onset  . Hypertension Mother   . Breast cancer Mother 741 . Hypertension Father   . Heart disease Father   . Diabetes Neg Hx   . COPD Neg Hx      Current Outpatient Medications on File Prior to Visit  Medication Sig Dispense Refill  . acetaminophen (TYLENOL) 325 MG tablet Take 2 tablets (650 mg total) by mouth every 4 (four) hours as needed. 100 tablet 2  . anastrozole (ARIMIDEX) 1 MG tablet Take 1 tablet (1 mg total) by mouth daily. 90 tablet 3  . cephALEXin (KEFLEX) 500 MG capsule Take 1 capsule (500 mg total) by mouth 4 (four) times daily for 7 days. 28 capsule 0  . Cholecalciferol (VITAMIN D3) 5000 UNITS TABS Take 1 tablet by mouth daily.    . clobetasol ointment (TEMOVATE) 09.21% Apply 1 application topically 2 (two) times daily. Then as needed for 30 days  1  . Flaxseed, Linseed, (FLAX SEED OIL PO) Take 5 mLs by mouth every morning.    . fluconazole (DIFLUCAN) 150 MG tablet     . gabapentin (NEURONTIN) 300 MG capsule TAKE 1 CAPSULE(300 MG) BY MOUTH THREE TIMES DAILY 270 capsule 0  . loratadine (CLARITIN) 10 MG tablet Take 10 mg by mouth daily.    . naproxen sodium (ALEVE) 220 MG tablet Take 660 mg by mouth daily as needed (pain).    . Olmesartan-amLODIPine-HCTZ 40-10-25 MG TABS Take 1 tablet by mouth daily.    .Marland Kitchenomeprazole (PRILOSEC) 40 MG capsule TAKE 1 CAPSULE(40 MG) BY MOUTH DAILY 30 capsule 2  . promethazine (PHENERGAN) 25 MG tablet Take 1 tablet (25 mg total) by mouth every 6 (six) hours as needed for nausea. 30 tablet 3  . tetrahydrozoline-zinc (VISINE-AC) 0.05-0.25 % ophthalmic solution Place 2 drops into both eyes as needed (dry, itchy eyes).    . [DISCONTINUED] prochlorperazine (COMPAZINE) 10 MG tablet Take 1 tablet (10 mg total) by mouth every 6 (six) hours as needed (Nausea or vomiting). 30 tablet 1   No current facility-administered medications on file prior to visit.      Cardiovascular studies:  EKG 05/30/2019: Sinus rhythm 89 bpm.  Poor R-wave progression. Nonspecific ST-T changes.  Echocardiogram 08/2018: - Left ventricle: The cavity size was normal. Wall thickness was   normal. Systolic function was normal. The estimated ejection   fraction was in the range of 60% to 65%. Wall motion was normal;   there were no regional wall motion abnormalities. Left   ventricular diastolic function parameters were normal. - Left atrium: The  atrium was mildly dilated. - Atrial septum: No defect or patent foramen ovale was identified. - Impressions: Normal GLS -20.  Impressions:  - Normal GLS -20.  Recent labs: Results for FLORENE, BRILL (MRN [id number]) as of 05/30/2019 10:00  Ref. Range 05/23/2019 14:56  WBC Latest Ref Range: 4.0 - 10.5 K/uL 4.5  RBC Latest Ref Range: 3.87 - 5.11 MIL/uL 4.21  Hemoglobin Latest Ref Range: 12.0 - 15.0 g/dL 12.2  HCT Latest Ref Range: 36.0 - 46.0 % 37.6  MCV Latest Ref Range: 80.0 - 100.0 fL 89.3  MCH Latest Ref Range: 26.0 - 34.0 pg 29.0  MCHC Latest Ref Range: 30.0 - 36.0 g/dL 32.4  RDW Latest Ref Range: 11.5 - 15.5 % 13.8  Platelets Latest Ref Range: 150 - 400 K/uL 133 (L)  nRBC Latest Ref Range: 0.0 - 0.2 % 0.0   Results for CHARISSE, WENDELL (MRN [id number]) as of 05/30/2019 10:00  Ref. Range 01/22/2019 08:50 01/29/2019 10:10 04/09/2019 16:15  Sodium Latest Ref Range: 135 - 145 mmol/L 139 140 139  Potassium Latest Ref Range: 3.5 - 5.1 mmol/L 3.5 3.4 (L) 3.2 (L)  Chloride Latest Ref Range: 98 - 111 mmol/L 103 102 102  CO2 Latest Ref Range: 22 - 32 mmol/L _0 Glucose Latest Ref Range: 70 - 99 mg/dL 109 (H) 104 (H) 98  BUN Latest Ref Range: 6 - 20 mg/dL _1 Creatinine Latest Ref Range: 0.44 - 1.00 mg/dL 0.91 0.89 0.94  Calcium Latest Ref Range: 8.9 - 10.3 mg/dL 9.2 9.4 9.3  Anion gap Latest Ref Range: 5 - _2 Review of Systems  Constitution: Negative for decreased appetite,  malaise/fatigue, weight gain and weight loss.  HENT: Negative for congestion.   Eyes: Negative for visual disturbance.  Cardiovascular: Positive for leg swelling (Improved). Negative for chest pain, dyspnea on exertion, palpitations and syncope.  Respiratory: Negative for cough.   Endocrine: Negative for cold intolerance.  Hematologic/Lymphatic: Does not bruise/bleed easily.  Skin: Negative for itching and rash.  Musculoskeletal: Negative for myalgias.  Gastrointestinal: Negative for abdominal pain, nausea and vomiting.  Genitourinary: Negative for dysuria.  Neurological: Negative for dizziness and weakness.  Psychiatric/Behavioral: The patient is not nervous/anxious.   All other systems reviewed and are negative.       Vitals:   05/30/19 1038  BP: (!) 141/89  Pulse: 96  Temp: (!) 97.1 F (36.2 C)  SpO2: 98%     Body mass index is 41.48 kg/m. Filed Weights   05/30/19 1038  Weight: 257 lb (116.6 kg)     Objective:   Physical Exam  Constitutional: She is oriented to person, place, and time. She appears well-developed and well-nourished. No distress.  HENT:  Head: Normocephalic and atraumatic.  Eyes: Pupils are equal, round, and reactive to light. Conjunctivae are normal.  Neck: No JVD present.  Cardiovascular: Normal rate, regular rhythm and intact distal pulses.  Pulmonary/Chest: Effort normal and breath sounds normal. She has no wheezes. She has no rales.  Abdominal: Soft. Bowel sounds are normal. There is no rebound.  Musculoskeletal:        General: No edema.  Lymphadenopathy:    She has no cervical adenopathy.  Neurological: She is alert and oriented to person, place, and time. No cranial nerve deficit.  Skin: Skin is warm and dry.  Psychiatric: She has a normal mood and affect.  Nursing note and vitals reviewed.  Assessment & Recommendations:   53 y.o. African American female with hypertension, migraines, h/o invasive ductal carcinoma s/p Rt  lumpectomy, chemotherapy and radiation.  Leg edema: Currently, this is completely resolved after being on lasix. She does not have any heart failure s/s. Her pre-chemotherapy echocardiogram in 08/2018 showed normal EF and GLS -20, which is normal. My clinical suspicion for chemotherapy induced cardiomyopathy is low at this time.   Hypertension most likely etiology. Amlodipine could also be a contributing factor.   I recommend changing amlodipine-olmesartan 10-40 mg to Spironolactone 50 mg daily. Will check BMP in 1 week. This hopefully will help with diuresis, hypertension, as well as reducing leg edema-if it was related to amlodipine. Continue lasix for as needed use. She may need additional agent for hypertension after this. In that case, beta blocker could be added, if necessary.   I will see her as needed, in case of worsening clinical symptoms.   Thank you for referring the patient to [name]. Please feel free to contact with any questions.  Nigel Mormon, MD [facility] Pager: [phone] Office: [phone] If no answer Cell [phone]

## 2019-05-31 ENCOUNTER — Encounter: Payer: Self-pay | Admitting: Cardiology

## 2019-05-31 DIAGNOSIS — R600 Localized edema: Secondary | ICD-10-CM | POA: Insufficient documentation

## 2019-06-10 LAB — BASIC METABOLIC PANEL
BUN/Creatinine Ratio: 11 (ref 9–23)
BUN: 11 mg/dL (ref 6–24)
CO2: 24 mmol/L (ref 20–29)
Calcium: 10 mg/dL (ref 8.7–10.2)
Chloride: 103 mmol/L (ref 96–106)
Creatinine, Ser: 0.97 mg/dL (ref 0.57–1.00)
GFR calc Af Amer: 76 mL/min/{1.73_m2} (ref >59)
GFR calc non Af Amer: 66 mL/min/{1.73_m2} (ref >59)
Glucose: 92 mg/dL (ref 65–99)
Potassium: 4.4 mmol/L (ref 3.5–5.2)
Sodium: 139 mmol/L (ref 134–144)

## 2019-06-10 NOTE — Progress Notes (Signed)
Called pt to inform her about her lab results. Pt understood

## 2019-06-11 ENCOUNTER — Other Ambulatory Visit: Payer: Self-pay | Admitting: Hematology and Oncology

## 2019-06-26 ENCOUNTER — Other Ambulatory Visit: Payer: Self-pay | Admitting: Cardiology

## 2019-06-26 DIAGNOSIS — I10 Essential (primary) hypertension: Secondary | ICD-10-CM

## 2019-06-26 DIAGNOSIS — R600 Localized edema: Secondary | ICD-10-CM

## 2019-07-03 ENCOUNTER — Other Ambulatory Visit: Payer: Self-pay | Admitting: Obstetrics and Gynecology

## 2019-07-03 DIAGNOSIS — Z9889 Other specified postprocedural states: Secondary | ICD-10-CM

## 2019-07-03 DIAGNOSIS — Z853 Personal history of malignant neoplasm of breast: Secondary | ICD-10-CM

## 2019-07-15 ENCOUNTER — Other Ambulatory Visit: Payer: Self-pay | Admitting: Adult Health

## 2019-07-15 ENCOUNTER — Telehealth: Payer: Self-pay | Admitting: Adult Health

## 2019-07-15 DIAGNOSIS — Z853 Personal history of malignant neoplasm of breast: Secondary | ICD-10-CM

## 2019-07-15 DIAGNOSIS — Z9889 Other specified postprocedural states: Secondary | ICD-10-CM

## 2019-07-15 NOTE — Telephone Encounter (Signed)
Talk with patient she prefer face to face visit

## 2019-07-18 ENCOUNTER — Other Ambulatory Visit: Payer: Self-pay

## 2019-07-18 ENCOUNTER — Inpatient Hospital Stay: Payer: BC Managed Care – PPO | Attending: Hematology and Oncology | Admitting: Adult Health

## 2019-07-18 ENCOUNTER — Encounter: Payer: Self-pay | Admitting: Adult Health

## 2019-07-18 VITALS — BP 144/81 | HR 88 | Temp 98.5°F | Resp 17 | Ht 66.0 in | Wt 255.0 lb

## 2019-07-18 DIAGNOSIS — I10 Essential (primary) hypertension: Secondary | ICD-10-CM | POA: Insufficient documentation

## 2019-07-18 DIAGNOSIS — K219 Gastro-esophageal reflux disease without esophagitis: Secondary | ICD-10-CM | POA: Insufficient documentation

## 2019-07-18 DIAGNOSIS — Z170 Estrogen receptor positive status [ER+]: Secondary | ICD-10-CM

## 2019-07-18 DIAGNOSIS — Z79899 Other long term (current) drug therapy: Secondary | ICD-10-CM | POA: Insufficient documentation

## 2019-07-18 DIAGNOSIS — G629 Polyneuropathy, unspecified: Secondary | ICD-10-CM | POA: Diagnosis not present

## 2019-07-18 DIAGNOSIS — K760 Fatty (change of) liver, not elsewhere classified: Secondary | ICD-10-CM | POA: Insufficient documentation

## 2019-07-18 DIAGNOSIS — Z79811 Long term (current) use of aromatase inhibitors: Secondary | ICD-10-CM | POA: Diagnosis not present

## 2019-07-18 DIAGNOSIS — Z923 Personal history of irradiation: Secondary | ICD-10-CM | POA: Insufficient documentation

## 2019-07-18 DIAGNOSIS — C50311 Malignant neoplasm of lower-inner quadrant of right female breast: Secondary | ICD-10-CM

## 2019-07-18 DIAGNOSIS — Z9221 Personal history of antineoplastic chemotherapy: Secondary | ICD-10-CM | POA: Diagnosis not present

## 2019-07-18 MED ORDER — LETROZOLE 2.5 MG PO TABS: 2.5000 mg | ORAL_TABLET | Freq: Every day | ORAL | 0 refills | Status: DC

## 2019-07-18 NOTE — Progress Notes (Signed)
CLINIC:  Survivorship   REASON FOR VISIT:  Routine follow-up post-treatment for a recent history of breast cancer.  BRIEF ONCOLOGIC HISTORY:  Oncology History  Malignant neoplasm of lower-inner quadrant of right breast of female, estrogen receptor positive ([place])  07/16/2018 Initial Diagnosis   Pain right breast UOQ: Negative on mammogram, incidentally found right breast mass LIQ at 3:30 position measuring 0.6 cm biopsy revealed grade 3 IDC ER 40%, PR 0%, HER-2 negative, Ki-67 50%, T1bN0 stage Ib   07/24/2018 Cancer Staging   Staging form: Breast, AJCC 8th Edition - Clinical: Stage IB (cT1b, cN0, cM0, G3, ER+, PR-, HER2-) - Signed by Nicholas Lose, MD on 07/24/2018   08/23/2018 Surgery   Right lumpectomy: IDC, 0.7 cm, grade 3, margins negative, 0/3 lymph nodes negative, ER 40%, PR 0%, HER-2 negative, Ki-67 50%, T1BN0 stage Ia   08/23/2018 Oncotype testing   45/33%   09/04/2018 Cancer Staging   Staging form: Breast, AJCC 8th Edition - Pathologic: Stage IA (pT1b, pN0, cM0, G3, ER+, PR-, HER2-) - Signed by Gardenia Phlegm, NP on 09/04/2018   09/25/2018 - 01/22/2019 Chemotherapy   Adjuvant chemotherapy: completed 4 cycles dose dense Adriamycin and Cytoxan, completed 10 cycles of Taxol, stopped early due to neuropathy    02/19/2019 - 04/07/2019 Radiation Therapy   Adjuvant XRT The patient initially received a dose of 50.4 Gy in 28 fractions to the breast using whole-breast tangent fields. This was delivered using a 3-D conformal technique. The patient then received a boost to the seroma. This delivered an additional 10 Gy in 5 fractions using a 3-field photon boost technique. The total dose was 60.4 Gy.   04/2019 -  Anti-estrogen oral therapy   Anastrozole daily     INTERVAL HISTORY:  [name] [name] presents to the [facility] today for our initial meeting to review her survivorship care plan detailing her treatment course for breast cancer, as well as monitoring long-term side  effects of that treatment, education regarding health maintenance, screening, and overall wellness and health promotion.     Overall, [name] [name] reports feeling quite well.  She notes some right breast swelling and tenderness.  She says it has been present since completing radiation therapy, and she has undergone 6 weeks of PT for it.  Though improved, this has persisted and she is concerned about it.  She has undergone mammogram of the breast last month that was normal.    [name] [name] also has difficulty tolerating the Anastrozole.  She has extreme achiness to the point that it is hard to get out of the bed and takes her about 2 hours to get going. She is having difficulty taking this.     REVIEW OF SYSTEMS:  Review of Systems  Constitutional: Negative for appetite change, chills, fatigue, fever and unexpected weight change.  HENT:   Negative for hearing loss, lump/mass, mouth sores and trouble swallowing.   Eyes: Negative for eye problems and icterus.  Respiratory: Negative for chest tightness, cough and shortness of breath.   Cardiovascular: Negative for chest pain, leg swelling and palpitations.  Gastrointestinal: Negative for abdominal distention, abdominal pain, constipation, diarrhea, nausea and vomiting.  Endocrine: Negative for hot flashes.  Genitourinary: Negative for difficulty urinating.   Musculoskeletal: Positive for arthralgias.  Skin: Negative for itching and rash.  Neurological: Negative for dizziness, extremity weakness, headaches and numbness.  Hematological: Negative for adenopathy. Does not bruise/bleed easily.  Psychiatric/Behavioral: Negative for depression. The patient is not nervous/anxious.  ONCOLOGY TREATMENT TEAM:  1. Surgeon:  Dr. Brantley Stage at [facility] 2. Medical Oncologist: Dr. Lindi Adie  3. Radiation Oncologist: Dr. Lisbeth Renshaw    PAST MEDICAL/SURGICAL HISTORY:  Past Medical History:  Diagnosis Date  . Allergy    seasonal  . Anal fissure    . Complication of anesthesia   . Diverticulitis   . GERD (gastroesophageal reflux disease) 07/07/2005  . Hemorrhoid   . Hepatic steatosis   . Hypertension   . Internal hemorrhoids   . Malignant neoplasm of lower-inner quadrant of right breast of female, estrogen receptor positive ([place]) 07/16/2018  . Personal history of chemotherapy   . Personal history of radiation therapy   . PONV (postoperative nausea and vomiting)   . Positive H. pylori test    Past Surgical History:  Procedure Laterality Date  . BREAST LUMPECTOMY Right 08/23/2018   Invasive Ductal   . BREAST LUMPECTOMY WITH RADIOACTIVE SEED AND SENTINEL LYMPH NODE BIOPSY Right 08/23/2018   Procedure: RIGHT BREAST LUMPECTOMY WITH RADIOACTIVE SEED AND RIGHT SENTINEL LYMPH NODE MAPPING;  Surgeon: Erroll Luna, MD;  Location: [place];  Service: General;  Laterality: Right;  . COLON RESECTION  2000's Dr. Hassell Done   Diverticulitis.  . COLONOSCOPY    . FOOT SURGERY Left   . g3p2 c-section1    . KNEE SURGERY Right   . PORT-A-CATH REMOVAL Right 04/15/2019   Procedure: REMOVAL PORT-A-CATH;  Surgeon: Erroll Luna, MD;  Location: [place];  Service: General;  Laterality: Right;  MAC AND LOCAL ANESTHETIC  . PORTACATH PLACEMENT Right 09/24/2018   Procedure: INSERTION PORT-A-CATH WITH ULTRASOUND;  Surgeon: Erroll Luna, MD;  Location: [place];  Service: General;  Laterality: Right;  . TONSILLECTOMY    . TUBAL LIGATION       ALLERGIES:  Allergies  Allergen Reactions  . Ibuprofen Swelling    SWELLING REACTION UNSPECIFIED   . Lisinopril Swelling    angioedema  . Dilaudid [Hydromorphone Hcl] Nausea Only  . Tape Rash     CURRENT MEDICATIONS:  Outpatient Encounter Medications as of 07/18/2019  Medication Sig  . acetaminophen (TYLENOL) 325 MG tablet Take 2 tablets (650 mg total) by mouth every 4 (four) hours as needed.  . Cholecalciferol (VITAMIN D3) 5000 UNITS TABS Take 1 tablet by mouth daily.  .  clobetasol ointment (TEMOVATE) 7.61 % Apply 1 application topically 2 (two) times daily. Then as needed for 30 days  . furosemide (LASIX) 20 MG tablet Take 20 mg by mouth daily. Take as needed, only if swelling remains in spite of using Spironlactone  . gabapentin (NEURONTIN) 300 MG capsule TAKE 1 CAPSULE(300 MG) BY MOUTH THREE TIMES DAILY  . loratadine (CLARITIN) 10 MG tablet Take 10 mg by mouth daily.  . naproxen sodium (ALEVE) 220 MG tablet Take 660 mg by mouth daily as needed (pain).  Marland Kitchen omeprazole (PRILOSEC) 40 MG capsule TAKE 1 CAPSULE(40 MG) BY MOUTH DAILY  . spironolactone (ALDACTONE) 50 MG tablet TAKE 1 TABLET(50 MG) BY MOUTH DAILY  . tetrahydrozoline-zinc (VISINE-AC) 0.05-0.25 % ophthalmic solution Place 2 drops into both eyes as needed (dry, itchy eyes).  . [DISCONTINUED] anastrozole (ARIMIDEX) 1 MG tablet Take 1 tablet (1 mg total) by mouth daily.  Marland Kitchen letrozole (FEMARA) 2.5 MG tablet Take 1 tablet (2.5 mg total) by mouth daily.  . [DISCONTINUED] prochlorperazine (COMPAZINE) 10 MG tablet Take 1 tablet (10 mg total) by mouth every 6 (six) hours as needed (Nausea or vomiting).   No facility-administered encounter medications on  file as of 07/18/2019.      ONCOLOGIC FAMILY HISTORY:  Family History  Problem Relation Age of Onset  . Hypertension Mother   . Breast cancer Mother 56  . Hypertension Father   . Heart disease Father   . Heart attack Father   . Diabetes Neg Hx   . COPD Neg Hx      GENETIC COUNSELING/TESTING: Not at this time  SOCIAL HISTORY: Social History   Socioeconomic History  . Marital status: Divorced    Spouse name: Not on file  . Number of children: 3  . Years of education: 22  . Highest education level: Not on file  Occupational History    Employer: NOT EMPLOYED  Social Needs  . Financial resource strain: Not on file  . Food insecurity    Worry: Not on file    Inability: Not on file  . Transportation needs    Medical: Not on file     Non-medical: Not on file  Tobacco Use  . Smoking status: Never Smoker  . Smokeless tobacco: Never Used  Substance and Sexual Activity  . Alcohol use: No    Alcohol/week: 0.0 standard drinks  . Drug use: No  . Sexual activity: Never    Birth control/protection: Post-menopausal    Comment: BTL  Lifestyle  . Physical activity    Days per week: Not on file    Minutes per session: Not on file  . Stress: Not on file  Relationships  . Social Herbalist on phone: Not on file    Gets together: Not on file    Attends religious service: Not on file    Active member of club or organization: Not on file    Attends meetings of clubs or organizations: Not on file    Relationship status: Not on file  . Intimate partner violence    Fear of current or ex partner: Not on file    Emotionally abused: Not on file    Physically abused: Not on file    Forced sexual activity: Not on file  Other Topics Concern  . Not on file  Social History Narrative   [employer], [employer] - Human service/sociology. Married '2000 - 77yr/seperated. 2 boys - '82, '87, 1 dtr - '81.   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      PHYSICAL EXAMINATION:  Vital Signs:   Vitals:   07/18/19 1403  BP: (!) 144/81  Pulse: 88  Resp: 17  Temp: 98.5 F (36.9 C)  SpO2: 100%   Filed Weights   07/18/19 1403  Weight: 255 lb (115.7 kg)   General: Well-nourished, well-appearing female in no acute distress.  She is unaccompanied today.   HEENT: Head is normocephalic.  Pupils equal and reactive to light. Conjunctivae clear without exudate.  Sclerae anicteric. Oral mucosa is pink, moist.  Oropharynx is pink without lesions or erythema.  Lymph: No cervical, supraclavicular, or infraclavicular lymphadenopathy noted on palpation.  Cardiovascular: Regular rate and rhythm..Marland KitchenRespiratory: Clear to auscultation bilaterally. Chest expansion symmetric; breathing non-labored.  Breast: right breast s/p lumpectomy and radiation, swelling and  tenderness noted, no sign of local recurrence, left breast benign GI: Abdomen soft and round; non-tender, non-distended. Bowel sounds normoactive.  GU: Deferred.  Neuro: No focal deficits. Steady gait.  Psych: Mood and affect normal and appropriate for situation.  Extremities: No edema. MSK: No focal spinal tenderness to palpation.  Full range of motion in bilateral upper extremities Skin: Warm and dry.  LABORATORY DATA:  None for this visit.  DIAGNOSTIC IMAGING:  None for this visit.      ASSESSMENT AND PLAN:  [name] [name] is a pleasant 53 y.o. female with Stage IA right breast invasive ductal carcinoma, ER+/PR-/HER2-, diagnosed in 06/2018, treated with lumpectomy, adjuvant chemotherapy, adjuvant radiation therapy, and anti-estrogen therapy with Anastrozole beginning in 04/2019.  She presents to the Survivorship Clinic for our initial meeting and routine follow-up post-completion of treatment for breast cancer.    1. Stage IA right breast cancer:  [name] [name] is continuing to recover from definitive treatment for breast cancer. She will follow-up with her medical oncologist, Dr. Lindi Adie in 6 months with history and physical exam per surveillance protocol.   Today, a comprehensive survivorship care plan and treatment summary was reviewed with the patient today detailing her breast cancer diagnosis, treatment course, potential late/long-term effects of treatment, appropriate follow-up care with recommendations for the future, and patient education resources.  A copy of this summary, along with a letter will be sent to the patient's primary care provider via mail/fax/In Basket message after today's visit.    2. Arthralgias: I recommended she stop the Anastrozole for 2-4 weeks and see if the arthralgias are better.  They most likely will be and if that is the case she will transition to Letrozole daily.  She will let us know how this goes.  3. Breast Swelling: She has h/o breast lymphedema and it is  normal for it to take longer for this to resolve.  I am encouraged that it is improving.  I recommended she continues to massage her breast daily and apply a moisturize and wear compression bra.  She will do these things.  We will postpone her mammogram to January since her breast is so tender.    4. Bone health:  Given [name] [name] age/history of breast cancer and her current treatment regimen including anti-estrogen therapy with Anastrozole, she is at risk for bone demineralization.  She has not undergone bone density testing and I ordered this today.  She was given education on specific activities to promote bone health.  5. Cancer screening:  Due to [name] [name] history and her age, she should receive screening for skin cancers, colon cancer, and gynecologic cancers.  The information and recommendations are listed on the patient's comprehensive care plan/treatment summary and were reviewed in detail with the patient.    6. Health maintenance and wellness promotion: [name] [name] was encouraged to consume 5-7 servings of fruits and vegetables per day. We reviewed the "Nutrition Rainbow" handout, as well as the handout "Take Control of Your Health and Reduce Your Cancer Risk" from the [employer].  She was also encouraged to engage in moderate to vigorous exercise for 30 minutes per day most days of the week. We discussed the [employer] fitness program, which is designed for cancer survivors to help them become more physically fit after cancer treatments.  She was instructed to limit her alcohol consumption and continue to abstain from tobacco use.     7. Support services/counseling: It is not uncommon for this period of the patient's cancer care trajectory to be one of many emotions and stressors.  We discussed an opportunity for her to participate in the next session of [facility] ("Finding Your New Normal") support group series designed for patients after they have completed treatment.   [name] [name] was  encouraged to take advantage of our many other support services programs, support groups, and/or counseling in coping  with her new life as a cancer survivor after completing anti-cancer treatment.  She was offered support today through active listening and expressive supportive counseling.  She was given information regarding our available services and encouraged to contact me with any questions or for help enrolling in any of our support group/programs.    Dispo:   -Return to cancer center in 09/2019 for f/u with Dr. Lindi Adie  -Mammogram due in 08/2019 -Bone density in 08/2019 -Follow up with Dr. Brantley Stage  -She is welcome to return back to the Survivorship Clinic at any time; no additional follow-up needed at this time.  -Consider referral back to survivorship as a long-term survivor for continued surveillance  A total of (30) minutes of face-to-face time was spent with this patient with greater than 50% of that time in counseling and care-coordination.   Gardenia Phlegm, NP Survivorship Program [facility] [phone]   Note: PRIMARY CARE PROVIDER Leonides Sake, [place] [phone]

## 2019-07-21 ENCOUNTER — Telehealth: Payer: Self-pay | Admitting: Hematology and Oncology

## 2019-07-21 NOTE — Telephone Encounter (Signed)
I talk with patient regarding schedule  

## 2019-07-27 ENCOUNTER — Other Ambulatory Visit: Payer: Self-pay | Admitting: Cardiology

## 2019-07-27 DIAGNOSIS — R600 Localized edema: Secondary | ICD-10-CM

## 2019-07-27 DIAGNOSIS — I10 Essential (primary) hypertension: Secondary | ICD-10-CM

## 2019-08-17 ENCOUNTER — Other Ambulatory Visit: Payer: Self-pay | Admitting: Adult Health

## 2019-08-17 DIAGNOSIS — Z170 Estrogen receptor positive status [ER+]: Secondary | ICD-10-CM

## 2019-08-17 DIAGNOSIS — C50311 Malignant neoplasm of lower-inner quadrant of right female breast: Secondary | ICD-10-CM

## 2019-08-25 ENCOUNTER — Other Ambulatory Visit: Payer: Self-pay

## 2019-08-25 DIAGNOSIS — C50311 Malignant neoplasm of lower-inner quadrant of right female breast: Secondary | ICD-10-CM

## 2019-08-25 DIAGNOSIS — Z170 Estrogen receptor positive status [ER+]: Secondary | ICD-10-CM

## 2019-08-25 MED ORDER — LETROZOLE 2.5 MG PO TABS: 2.5000 mg | ORAL_TABLET | Freq: Every day | ORAL | 0 refills | Status: DC

## 2019-08-26 ENCOUNTER — Other Ambulatory Visit: Payer: Self-pay | Admitting: *Deleted

## 2019-09-04 ENCOUNTER — Other Ambulatory Visit: Payer: Self-pay | Admitting: Obstetrics and Gynecology

## 2019-09-04 DIAGNOSIS — R1011 Right upper quadrant pain: Secondary | ICD-10-CM

## 2019-09-05 ENCOUNTER — Ambulatory Visit
Admission: RE | Admit: 2019-09-05 | Discharge: 2019-09-05 | Disposition: A | Discharge status: Home / Self Care | Payer: BC Managed Care – PPO | Source: Ambulatory Visit | Attending: Adult Health | Admitting: Adult Health

## 2019-09-05 ENCOUNTER — Other Ambulatory Visit: Payer: Self-pay

## 2019-09-05 DIAGNOSIS — C50311 Malignant neoplasm of lower-inner quadrant of right female breast: Secondary | ICD-10-CM

## 2019-09-05 DIAGNOSIS — Z170 Estrogen receptor positive status [ER+]: Secondary | ICD-10-CM

## 2019-09-08 ENCOUNTER — Other Ambulatory Visit: Payer: Self-pay | Admitting: Hematology and Oncology

## 2019-09-18 ENCOUNTER — Ambulatory Visit: Payer: BC Managed Care – PPO | Admitting: Physician Assistant

## 2019-09-18 ENCOUNTER — Encounter: Payer: Self-pay | Admitting: Physician Assistant

## 2019-09-18 ENCOUNTER — Other Ambulatory Visit (INDEPENDENT_AMBULATORY_CARE_PROVIDER_SITE_OTHER): Payer: BC Managed Care – PPO

## 2019-09-18 VITALS — BP 142/80 | HR 92 | Temp 98.6°F | Ht 66.0 in | Wt 254.0 lb

## 2019-09-18 DIAGNOSIS — R1011 Right upper quadrant pain: Secondary | ICD-10-CM

## 2019-09-18 DIAGNOSIS — Z853 Personal history of malignant neoplasm of breast: Secondary | ICD-10-CM

## 2019-09-18 DIAGNOSIS — R0789 Other chest pain: Secondary | ICD-10-CM | POA: Diagnosis not present

## 2019-09-18 LAB — CBC WITH DIFFERENTIAL/PLATELET
Basophils Absolute: 0 10*3/uL (ref 0.0–0.1)
Basophils Relative: 0.4 % (ref 0.0–3.0)
Eosinophils Absolute: 0.2 10*3/uL (ref 0.0–0.7)
Eosinophils Relative: 2.8 % (ref 0.0–5.0)
HCT: 41.4 % (ref 36.0–46.0)
Hemoglobin: 14 g/dL (ref 12.0–15.0)
Lymphocytes Relative: 22.2 % (ref 12.0–46.0)
Lymphs Abs: 1.2 10*3/uL (ref 0.7–4.0)
MCHC: 33.7 g/dL (ref 30.0–36.0)
MCV: 86.4 fl (ref 78.0–100.0)
Monocytes Absolute: 0.5 10*3/uL (ref 0.1–1.0)
Monocytes Relative: 8.2 % (ref 3.0–12.0)
Neutro Abs: 3.7 10*3/uL (ref 1.4–7.7)
Neutrophils Relative %: 66.4 % (ref 43.0–77.0)
Platelets: 137 10*3/uL — ABNORMAL LOW (ref 150.0–400.0)
RBC: 4.79 Mil/uL (ref 3.87–5.11)
RDW: 14.5 % (ref 11.5–15.5)
WBC: 5.5 10*3/uL (ref 4.0–10.5)

## 2019-09-18 LAB — COMPREHENSIVE METABOLIC PANEL
ALT: 16 U/L (ref 0–35)
AST: 20 U/L (ref 0–37)
Albumin: 4.6 g/dL (ref 3.5–5.2)
Alkaline Phosphatase: 99 U/L (ref 39–117)
BUN: 13 mg/dL (ref 6–23)
CO2: 31 mEq/L (ref 19–32)
Calcium: 10.2 mg/dL (ref 8.4–10.5)
Chloride: 101 mEq/L (ref 96–112)
Creatinine, Ser: 0.98 mg/dL (ref 0.40–1.20)
GFR: 71.03 mL/min (ref >60.00)
Glucose, Bld: 98 mg/dL (ref 70–99)
Potassium: 4.2 mEq/L (ref 3.5–5.1)
Sodium: 139 mEq/L (ref 135–145)
Total Bilirubin: 0.5 mg/dL (ref 0.2–1.2)
Total Protein: 7.8 g/dL (ref 6.0–8.3)

## 2019-09-18 NOTE — Patient Instructions (Addendum)
If you are age 56 or older, your body mass index should be between 23-30. Your Body mass index is 41 kg/m. If this is out of the aforementioned range listed, please consider follow up with your Primary Care Provider.  If you are age 15 or younger, your body mass index should be between 19-25. Your Body mass index is 41 kg/m. If this is out of the aformentioned range listed, please consider follow up with your Primary Care Provider.   Your provider has requested that you go to the basement level for lab work before leaving today. Press "B" on the elevator. The lab is located at the first door on the left as you exit the elevator.  We have sent the following medications to your pharmacy for you to pick up at your convenience: Continue Omeprazole 40 mg daily.  Please purchase the following medications over the counter and take as directed: Recticare Complete OTC apply 2-3 times daily as needed.   You have been scheduled for a CT scan of the chest and abdomen at [facility] ([address]---this is in the same building as [employer]).   You are scheduled on Thursday 09/25/19 at 3 pm. You should arrive 15 minutes prior to your appointment time for registration. Please follow the written instructions below on the day of your exam:  WARNING: IF YOU ARE ALLERGIC TO IODINE/X-RAY DYE, PLEASE NOTIFY RADIOLOGY IMMEDIATELY AT [phone]! YOU WILL BE GIVEN A 13 HOUR PREMEDICATION PREP.  1) Do not eat or drink anything after 11 am (4 hours prior to your test) 2) You have been given 2 bottles of oral contrast to drink. The solution may taste better if refrigerated, but do NOT add ice or any other liquid to this solution. Shake well before drinking.      Drink 1 bottle of contrast @ 2 pm (1 hour prior to your exam)  You may take any medications as prescribed with a small amount of water, if necessary. If you take any of the following medications: METFORMIN, GLUCOPHAGE,  GLUCOVANCE, AVANDAMET, RIOMET, FORTAMET, [place] MET, JANUMET, GLUMETZA or METAGLIP, you MAY be asked to HOLD this medication 48 hours AFTER the exam.  The purpose of you drinking the oral contrast is to aid in the visualization of your intestinal tract. The contrast solution may cause some diarrhea. Depending on your individual set of symptoms, you may also receive an intravenous injection of x-ray contrast/dye. Plan on being at [facility] for 30 minutes or longer, depending on the type of exam you are having performed.  This test typically takes 30-45 minutes to complete.  If you have any questions regarding your exam or if you need to reschedule, you may call the CT department at [phone] between the hours of 8:00 am and 5:00 pm, Monday-Friday.  ______________________________________________________________

## 2019-09-19 ENCOUNTER — Encounter: Payer: Self-pay | Admitting: Physician Assistant

## 2019-09-19 NOTE — Progress Notes (Signed)
Subjective:    Patient ID: [name] [name], female    DOB: [birth year], 54 y.o.   MRN: [id number]  HPI Talli is a pleasant 54 year old female established with Dr. Hilarie Fredrickson who comes in today with complaints of right upper quadrant abdominal pain. Patient has history of hypertension, GERD, obesity, and was diagnosed with breast cancer in November 2019.  Stage Ib she underwent right lumpectomy ,chemotherapy/ radiation and is now on anastrozole. Patient underwent colonoscopy January 2019 showing scattered diverticulosis and internal hemorrhoids, no polyps. She had been seen here in 2018 with complaints of right upper quadrant pain at that time she underwent an ultrasound which did not show any evidence of gallstones or sludge she did have increased echogenicity of the liver.  [facility] HIDA scan was negative. Patient says she developed recurrent right upper quadrant pain a few months ago.  This seems to be exacerbated postprandially but comes and goes.  She will have pain that radiates through and around into her right shoulder blade.  Pain is present on a daily basis at this point though some days significantly worse than others.  She is not aware of any positional exacerbating factors though says sometimes it is uncomfortable to lie on her right side. She feels that she has been eating a bit less due to the pain or concerns of bringing on increased pain.  She has had some early morning nausea, no vomiting.  Bowel movements have been fairly normal with use of MiraLAX as needed.  No melena or hematochezia. Review of Systems Pertinent positive and negative review of systems were noted in the above HPI section.  All other review of systems was otherwise negative. Outpatient Encounter Medications as of 09/18/2019  Medication Sig  . acetaminophen (TYLENOL) 325 MG tablet Take 2 tablets (650 mg total) by mouth every 4 (four) hours as needed.  . Cholecalciferol (VITAMIN D3) 5000 UNITS TABS Take 1 tablet by  mouth daily.  . clobetasol ointment (TEMOVATE) 8.18 % Apply 1 application topically 2 (two) times daily. Then as needed for 30 days  . furosemide (LASIX) 20 MG tablet Take 20 mg by mouth daily. Take as needed, only if swelling remains in spite of using Spironlactone  . gabapentin (NEURONTIN) 300 MG capsule TAKE 1 CAPSULE(300 MG) BY MOUTH THREE TIMES DAILY  . letrozole (FEMARA) 2.5 MG tablet Take 1 tablet (2.5 mg total) by mouth daily.  Marland Kitchen loratadine (CLARITIN) 10 MG tablet Take 10 mg by mouth daily.  . naproxen sodium (ALEVE) 220 MG tablet Take 660 mg by mouth daily as needed (pain).  Marland Kitchen omeprazole (PRILOSEC) 40 MG capsule TAKE 1 CAPSULE(40 MG) BY MOUTH DAILY  . spironolactone (ALDACTONE) 50 MG tablet TAKE 1 TABLET(50 MG) BY MOUTH DAILY  . tetrahydrozoline-zinc (VISINE-AC) 0.05-0.25 % ophthalmic solution Place 2 drops into both eyes as needed (dry, itchy eyes).  . [DISCONTINUED] prochlorperazine (COMPAZINE) 10 MG tablet Take 1 tablet (10 mg total) by mouth every 6 (six) hours as needed (Nausea or vomiting).   No facility-administered encounter medications on file as of 09/18/2019.   Allergies  Allergen Reactions  . Ibuprofen Swelling    SWELLING REACTION UNSPECIFIED   . Lisinopril Swelling    angioedema  . Dilaudid [Hydromorphone Hcl] Nausea Only  . Tape Rash   Patient Active Problem List   Diagnosis Date Noted  . Leg edema 05/31/2019  . Port-A-Cath in place 09/25/2018  . Malignant neoplasm of lower-inner quadrant of right breast of female, estrogen receptor positive ([place]) 07/16/2018  .  Special screening for malignant neoplasms, colon 07/10/2017  . Anal fissure 07/07/2014  . Pruritic rash 06/16/2014  . Obesity 06/16/2014  . Primary localized osteoarthrosis, lower leg 11/18/2013  . Bilateral knee pain 09/08/2013  . VITAMIN B12 DEFICIENCY 02/10/2010  . Essential hypertension 08/16/2007  . ALLERGIC RHINITIS 08/16/2007  . GERD 08/16/2007  . Right upper quadrant pain 05/13/2007    Social History   Socioeconomic History  . Marital status: Divorced    Spouse name: Not on file  . Number of children: 3  . Years of education: 62  . Highest education level: Not on file  Occupational History    Employer: NOT EMPLOYED  Tobacco Use  . Smoking status: Never Smoker  . Smokeless tobacco: Never Used  Substance and Sexual Activity  . Alcohol use: No    Alcohol/week: 0.0 standard drinks  . Drug use: No  . Sexual activity: Not Currently    Birth control/protection: Post-menopausal  Other Topics Concern  . Not on file  Social History Narrative   [employer], [employer] - Human service/sociology. Married '2000 - 69yr/seperated. 2 boys - '82, '87, 1 dtr - '81.   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    Social Determinants of Health   Financial Resource Strain:   . Difficulty of Paying Living Expenses: Not on file  Food Insecurity:   . Worried About RCharity fundraiserin the Last Year: Not on file  . Ran Out of Food in the Last Year: Not on file  Transportation Needs:   . Lack of Transportation (Medical): Not on file  . Lack of Transportation (Non-Medical): Not on file  Physical Activity:   . Days of Exercise per Week: Not on file  . Minutes of Exercise per Session: Not on file  Stress:   . Feeling of Stress : Not on file  Social Connections:   . Frequency of Communication with Friends and Family: Not on file  . Frequency of Social Gatherings with Friends and Family: Not on file  . Attends Religious Services: Not on file  . Active Member of Clubs or Organizations: Not on file  . Attends CArchivistMeetings: Not on file  . Marital Status: Not on file  Intimate Partner Violence:   . Fear of Current or Ex-Partner: Not on file  . Emotionally Abused: Not on file  . Physically Abused: Not on file  . Sexually Abused: Not on file    Ms. Fruchter's family history includes Breast cancer (age of onset: 741 in her mother; Heart attack in her father; Heart disease in her father;  Hypertension in her father and mother.      Objective:    Vitals:   09/18/19 0852  BP: (!) 142/80  Pulse: 92  Temp: 98.6 F (37 C)  SpO2: 98%    Physical Exam Well-developed well-nourished AA female in no acute distress.  Height, Weight, 254 BMI 41.0  HEENT; nontraumatic normocephalic, EOMI, PER R LA, sclera anicteric. Oropharynx; not examined Neck; supple, no JVD Cardiovascular; regular rate and rhythm with S1-S2, no murmur rub or gallop Pulmonary; Clear bilaterally, she is tender over the right lower chest wall/ribs and around laterally Abdomen; soft, tender right upper quadrant, obese, nondistended, no palpable mass or hepatosplenomegaly, bowel sounds are active Rectal; not done Skin; benign exam, no jaundice rash or appreciable lesions Extremities; no clubbing cyanosis or edema skin warm and dry Neuro/Psych; alert and oriented x4, grossly nonfocal mood and affect appropriate       Assessment &  Plan:   #72 54 year old African-American female with 3 to 69-monthhistory of right upper quadrant pain and right lower chest wall pain radiating laterally and into the right shoulder blade. Patient feels that her pain is exacerbated by eating, she has had some early morning nausea, no vomiting.  Some decrease in oral intake.  Etiology is not clear, she certainly has a component of musculoskeletal pain on exam. Patient has history of breast cancer diagnosed in 2019 and is status post right lumpectomy, chemotherapy and radiation.  Ultrasound and [facility] HIDA scan 2 years ago negative.  #2 hepatic steatosis 3.  Obesity 4.  Diverticulosis 5.  History of GERD 6.  Colon cancer surveillance-up-to-date with recent colonoscopy done January 2019, no polyps.  Plan; CBC, c-Met, sed rate, Continue Prilosec 40 mg p.o. every morning Schedule for CT scan of the chest and abdomen with contrast. Patient had mentioned mild hemorrhoidal symptoms with itching, advised RectiCare complete OTC as  needed. Further work-up pending results of above.  Consider EGD with Dr. PHilarie Fredricksonif above unrevealing.   Lashann Hagg S Lyndol Vanderheiden PA-C 09/19/2019   Cc: [name], MLorin Mercy MD

## 2019-09-25 ENCOUNTER — Ambulatory Visit (INDEPENDENT_AMBULATORY_CARE_PROVIDER_SITE_OTHER)
Admission: RE | Admit: 2019-09-25 | Discharge: 2019-09-25 | Disposition: A | Discharge status: Home / Self Care | Payer: BC Managed Care – PPO | Source: Ambulatory Visit | Attending: Physician Assistant | Admitting: Physician Assistant

## 2019-09-25 ENCOUNTER — Other Ambulatory Visit: Payer: Self-pay

## 2019-09-25 DIAGNOSIS — R1011 Right upper quadrant pain: Secondary | ICD-10-CM

## 2019-09-25 DIAGNOSIS — Z853 Personal history of malignant neoplasm of breast: Secondary | ICD-10-CM | POA: Diagnosis not present

## 2019-09-25 DIAGNOSIS — R0789 Other chest pain: Secondary | ICD-10-CM

## 2019-09-25 MED ORDER — IOHEXOL 300 MG/ML  SOLN
100.0000 mL | Freq: Once | INTRAMUSCULAR | Status: AC | PRN
  Administered 2019-09-25: 100 mL via INTRAVENOUS

## 2019-09-30 ENCOUNTER — Telehealth: Payer: Self-pay | Admitting: Physician Assistant

## 2019-09-30 MED ORDER — HYDROCORTISONE ACETATE 25 MG RE SUPP: 25.0000 mg | Freq: Every day | RECTAL | 0 refills | Status: DC

## 2019-09-30 NOTE — Telephone Encounter (Signed)
Lets have her try Anusol HX supp at bedtime x 10 days, and can continue Recticare for daytime 3-4 times daily prn - if no better in 2-3 weeks will need to examine her

## 2019-09-30 NOTE — Telephone Encounter (Signed)
Patient informed of script for suppositories sent to the pharmacy. Patient informed to call with update after 10 days. Patient voiced understanding.

## 2019-09-30 NOTE — Telephone Encounter (Signed)
Please advise 

## 2019-10-07 NOTE — Progress Notes (Signed)
Addendum: Reviewed and agree with assessment and management plan. Cohan Stipes M, MD  

## 2019-10-12 ENCOUNTER — Other Ambulatory Visit: Payer: Self-pay

## 2019-10-12 ENCOUNTER — Ambulatory Visit
Admission: EM | Admit: 2019-10-12 | Discharge: 2019-10-12 | Disposition: A | Discharge status: Home / Self Care | Payer: BC Managed Care – PPO | Attending: Family Medicine | Admitting: Family Medicine

## 2019-10-12 ENCOUNTER — Encounter: Payer: Self-pay | Admitting: Emergency Medicine

## 2019-10-12 DIAGNOSIS — L0889 Other specified local infections of the skin and subcutaneous tissue: Secondary | ICD-10-CM | POA: Insufficient documentation

## 2019-10-12 DIAGNOSIS — B354 Tinea corporis: Secondary | ICD-10-CM | POA: Insufficient documentation

## 2019-10-12 DIAGNOSIS — N898 Other specified noninflammatory disorders of vagina: Secondary | ICD-10-CM | POA: Insufficient documentation

## 2019-10-12 LAB — WET PREP, GENITAL
Clue Cells Wet Prep HPF POC: NONE SEEN
Sperm: NONE SEEN
Trich, Wet Prep: NONE SEEN
Yeast Wet Prep HPF POC: NONE SEEN

## 2019-10-12 MED ORDER — FLUCONAZOLE 150 MG PO TABS: 150.0000 mg | ORAL_TABLET | Freq: Once | ORAL | 0 refills | Status: AC

## 2019-10-12 MED ORDER — SULFAMETHOXAZOLE-TRIMETHOPRIM 800-160 MG PO TABS: 1.0000 | ORAL_TABLET | Freq: Two times a day (BID) | ORAL | 0 refills | Status: DC

## 2019-10-12 NOTE — ED Triage Notes (Addendum)
Patient c/o itchy rash on her abdomen and back that started last Sunday.  Patient c/o vaginal itching and discharge for 2 weeks.  Patient states that her PCP prescribed a 10 day yeast suppository medication.  Her last day of her suppository was on Thursday.

## 2019-10-12 NOTE — ED Provider Notes (Signed)
[facility]    CSN: [id number] Arrival date & time: 10/12/19  1355      History   Chief Complaint Chief Complaint  Patient presents with  . Vaginal Itching  . Rash    HPI [name] [name] is a 54 y.o. female.   54 yo female with a c/o an itchy rash on her abdomen and right back area for the past week. States she's been scratching the area and now has some bumps. Patient also notes vaginal itching and discharge and was recently treated for a yeast infection with yeast suppository medication. States she's not sexually active so she's not concerned about STDs. Denies rash being painful or burning/tingling. Denies any abdominal pain, fevers, chills, vomiting, diarrhea, dysuria.    Vaginal Itching  Rash   Past Medical History:  Diagnosis Date  . Allergy    seasonal  . Anal fissure   . Complication of anesthesia   . Diverticulitis   . GERD (gastroesophageal reflux disease) 07/07/2005  . Hemorrhoid   . Hepatic steatosis   . Hypertension   . Internal hemorrhoids   . Malignant neoplasm of lower-inner quadrant of right breast of female, estrogen receptor positive ([place]) 07/16/2018  . Personal history of chemotherapy   . Personal history of radiation therapy   . PONV (postoperative nausea and vomiting)   . Positive H. pylori test     Patient Active Problem List   Diagnosis Date Noted  . Leg edema 05/31/2019  . Port-A-Cath in place 09/25/2018  . Malignant neoplasm of lower-inner quadrant of right breast of female, estrogen receptor positive ([place]) 07/16/2018  . Special screening for malignant neoplasms, colon 07/10/2017  . Anal fissure 07/07/2014  . Pruritic rash 06/16/2014  . Obesity 06/16/2014  . Primary localized osteoarthrosis, lower leg 11/18/2013  . Bilateral knee pain 09/08/2013  . VITAMIN B12 DEFICIENCY 02/10/2010  . Essential hypertension 08/16/2007  . ALLERGIC RHINITIS 08/16/2007  . GERD 08/16/2007  . Right upper quadrant pain 05/13/2007     Past Surgical History:  Procedure Laterality Date  . BREAST LUMPECTOMY Right    Invasive Ductal   . BREAST LUMPECTOMY WITH RADIOACTIVE SEED AND SENTINEL LYMPH NODE BIOPSY Right 08/23/2018   Procedure: RIGHT BREAST LUMPECTOMY WITH RADIOACTIVE SEED AND RIGHT SENTINEL LYMPH NODE MAPPING;  Surgeon: Erroll Luna, MD;  Location: [facility];  Service: General;  Laterality: Right;  . COLON RESECTION  2000's Dr. Hassell Done   Diverticulitis.  . COLONOSCOPY    . FOOT SURGERY Left   . g3p2 c-section1    . KNEE SURGERY Right   . PORT-A-CATH REMOVAL Right 04/15/2019   Procedure: REMOVAL PORT-A-CATH;  Surgeon: Erroll Luna, MD;  Location: [facility];  Service: General;  Laterality: Right;  MAC AND LOCAL ANESTHETIC  . PORTACATH PLACEMENT Right 09/24/2018   Procedure: INSERTION PORT-A-CATH WITH ULTRASOUND;  Surgeon: Erroll Luna, MD;  Location: [facility];  Service: General;  Laterality: Right;  . TONSILLECTOMY    . TUBAL LIGATION      OB History   No obstetric history on file.      Home Medications    Prior to Admission medications   Medication Sig Start Date End Date Taking? Authorizing Provider  Cholecalciferol (VITAMIN D3) 5000 UNITS TABS Take 1 tablet by mouth daily.   Yes [provider]  furosemide (LASIX) 20 MG tablet Take 20 mg by mouth daily. Take as needed, only if swelling remains in spite of using Spironlactone   Yes [provider]  letrozole (FEMARA) 2.5 MG tablet Take 1 tablet (2.5 mg total) by mouth daily. 08/25/19  Yes Nicholas Lose, MD  loratadine (CLARITIN) 10 MG tablet Take 10 mg by mouth daily.   Yes [provider]  naproxen sodium (ALEVE) 220 MG tablet Take 660 mg by mouth daily as needed (pain).   Yes [provider]  omeprazole (PRILOSEC) 40 MG capsule TAKE 1 CAPSULE(40 MG) BY MOUTH DAILY 09/08/19  Yes Nicholas Lose, MD  spironolactone (ALDACTONE) 50 MG tablet TAKE 1 TABLET(50 MG) BY MOUTH DAILY 07/28/19  Yes  Patwardhan, Manish J, MD  acetaminophen (TYLENOL) 325 MG tablet Take 2 tablets (650 mg total) by mouth every 4 (four) hours as needed. 04/15/19 04/14/20  Cornett, [name], MD  clobetasol ointment (TEMOVATE) 8.29 % Apply 1 application topically 2 (two) times daily. Then as needed for 30 days 07/03/18   [provider]  fluconazole (DIFLUCAN) 150 MG tablet Take 1 tablet (150 mg total) by mouth once for 1 dose. Then repeat in 1 week 10/12/19 10/12/19  Norval Gable, MD  gabapentin (NEURONTIN) 300 MG capsule TAKE 1 CAPSULE(300 MG) BY MOUTH THREE TIMES DAILY 04/21/19   Nicholas Lose, MD  hydrocortisone (ANUSOL-HC) 25 MG suppository Place 1 suppository (25 mg total) rectally at bedtime. 09/30/19   Esterwood, Amy S, PA-C  sulfamethoxazole-trimethoprim (BACTRIM DS) 800-160 MG tablet Take 1 tablet by mouth 2 (two) times daily. 10/12/19   Norval Gable, MD  tetrahydrozoline-zinc (VISINE-AC) 0.05-0.25 % ophthalmic solution Place 2 drops into both eyes as needed (dry, itchy eyes).    [provider]  prochlorperazine (COMPAZINE) 10 MG tablet Take 1 tablet (10 mg total) by mouth every 6 (six) hours as needed (Nausea or vomiting). 09/19/18 01/29/19  Nicholas Lose, MD    Family History Family History  Problem Relation Age of Onset  . Hypertension Mother   . Breast cancer Mother 28  . Hypertension Father   . Heart disease Father   . Heart attack Father   . Diabetes Neg Hx   . COPD Neg Hx     Social History Social History   Tobacco Use  . Smoking status: Never Smoker  . Smokeless tobacco: Never Used  Substance Use Topics  . Alcohol use: No    Alcohol/week: 0.0 standard drinks  . Drug use: No     Allergies   Ibuprofen, Lisinopril, Dilaudid [hydromorphone hcl], and Tape   Review of Systems Review of Systems  Skin: Positive for rash.     Physical Exam Triage Vital Signs ED Triage Vitals  Enc Vitals Group     BP 10/12/19 1408 (!) 174/99     Pulse Rate 10/12/19 1408 100      Resp 10/12/19 1408 16     Temp 10/12/19 1408 98.4 F (36.9 C)     Temp Source 10/12/19 1408 Oral     SpO2 10/12/19 1408 98 %     Weight 10/12/19 1404 242 lb (109.8 kg)     Height 10/12/19 1404 _0  (1.676 m)     Head Circumference --      Peak Flow --      Pain Score 10/12/19 1404 7     Pain Loc --      Pain Edu? --      Excl. in [place]? --    No data found.  Updated Vital Signs BP (!) 174/99 (BP Location: Left Arm)   Pulse 100   Temp 98.4 F (36.9 C) (Oral)   Resp  16   Ht _0  (1.676 m)   Wt 109.8 kg   LMP 09/13/2015 Comment: celebate, BTL  SpO2 98%   BMI 39.06 kg/m   Visual Acuity Right Eye Distance:   Left Eye Distance:   Bilateral Distance:    Right Eye Near:   Left Eye Near:    Bilateral Near:     Physical Exam Vitals and nursing note reviewed.  Constitutional:      General: She is not in acute distress.    Appearance: She is not toxic-appearing or diaphoretic.  Abdominal:     General: Bowel sounds are normal. There is no distension.     Palpations: Abdomen is soft. There is no mass.     Tenderness: There is no abdominal tenderness. There is no right CVA tenderness, left CVA tenderness, guarding or rebound.     Hernia: No hernia is present.  Skin:    Findings: Rash (erythematous, scaly rash on lower abdomen and right lower back with several additional pustular lesions; no drianage.) present.  Neurological:     Mental Status: She is alert.      UC Treatments / Results  Labs (all labs ordered are listed, but only abnormal results are displayed) Labs Reviewed  WET PREP, GENITAL - Abnormal; Notable for the following components:      Result Value   WBC, Wet Prep HPF POC FEW (*)    All other components within normal limits    EKG   Radiology No results found.  Procedures Procedures (including critical care time)  Medications Ordered in UC Medications - No data to display  Initial Impression / Assessment and Plan / UC Course  I have reviewed  the triage vital signs and the nursing notes.  Pertinent labs & imaging results that were available during my care of the patient were reviewed by me and considered in my medical decision making (see chart for details).      Final Clinical Impressions(s) / UC Diagnoses   Final diagnoses:  Tinea corporis  Secondary infection of skin  Vaginal discharge    ED Prescriptions    Medication Sig Dispense Auth. Provider   fluconazole (DIFLUCAN) 150 MG tablet Take 1 tablet (150 mg total) by mouth once for 1 dose. Then repeat in 1 week 2 tablet Sayla Golonka, Linward Foster, MD   sulfamethoxazole-trimethoprim (BACTRIM DS) 800-160 MG tablet Take 1 tablet by mouth 2 (two) times daily. 14 tablet Norval Gable, MD      1. Lab result and diagnosis reviewed with patient 2. rx as per orders above; reviewed possible side effects, interactions, risks and benefits  3. Follow-up prn if symptoms worsen or don't improve  PDMP not reviewed this encounter.   Norval Gable, MD 10/12/19 [phone]

## 2019-10-14 ENCOUNTER — Other Ambulatory Visit: Payer: Self-pay | Admitting: Adult Health

## 2019-10-14 DIAGNOSIS — E2839 Other primary ovarian failure: Secondary | ICD-10-CM

## 2019-10-15 ENCOUNTER — Other Ambulatory Visit: Payer: Self-pay

## 2019-10-15 ENCOUNTER — Telehealth: Payer: Self-pay

## 2019-10-15 ENCOUNTER — Ambulatory Visit
Admission: RE | Admit: 2019-10-15 | Discharge: 2019-10-15 | Disposition: A | Discharge status: Home / Self Care | Payer: BC Managed Care – PPO | Source: Ambulatory Visit | Attending: Adult Health | Admitting: Adult Health

## 2019-10-15 DIAGNOSIS — E2839 Other primary ovarian failure: Secondary | ICD-10-CM

## 2019-10-15 NOTE — Telephone Encounter (Signed)
-----   Message from Gardenia Phlegm, NP sent at 10/15/2019  4:33 PM EST ----- Bone density normal, please notify patient.  Good news. ----- Message ----- From: Interface, Rad Results In Sent: 10/15/2019  10:40 AM EST To: Gardenia Phlegm, NP

## 2019-10-15 NOTE — Telephone Encounter (Signed)
Called and given below message. She verbalized understanding. 

## 2019-10-16 NOTE — Progress Notes (Signed)
Patient Care Team: [name], [name], MD as PCP - General (Family Medicine) Erroll Luna, MD as Consulting Physician (General Surgery) Nicholas Lose, MD as Consulting Physician (Hematology and Oncology) Kyung Rudd, MD as Consulting Physician (Radiation Oncology)  DIAGNOSIS:    ICD-10-CM   1. Malignant neoplasm of lower-inner quadrant of right breast of female, estrogen receptor positive ([name] [name])  C50.311 letrozole ([place]) 2.5 MG tablet   Z17.0     SUMMARY OF ONCOLOGIC HISTORY: Oncology History  Malignant neoplasm of lower-inner quadrant of right breast of female, estrogen receptor positive ([name] [name])  07/16/2018 Initial Diagnosis   Pain right breast UOQ: Negative on mammogram, incidentally found right breast mass LIQ at 3:30 position measuring 0.6 cm biopsy revealed grade 3 IDC ER 40%, PR 0%, HER-2 negative, Ki-67 50%, T1bN0 stage Ib   07/24/2018 Cancer Staging   Staging form: Breast, AJCC 8th Edition - Clinical: Stage IB (cT1b, cN0, cM0, G3, ER+, PR-, HER2-) - Signed by Nicholas Lose, MD on 07/24/2018   08/23/2018 Surgery   Right lumpectomy: IDC, 0.7 cm, grade 3, margins negative, 0/3 lymph nodes negative, ER 40%, PR 0%, HER-2 negative, Ki-67 50%, T1BN0 stage Ia   08/23/2018 Oncotype testing   45/33%   09/04/2018 Cancer Staging   Staging form: Breast, AJCC 8th Edition - Pathologic: Stage IA (pT1b, pN0, cM0, G3, ER+, PR-, HER2-) - Signed by Gardenia Phlegm, NP on 09/04/2018   09/25/2018 - 01/22/2019 Chemotherapy   Adjuvant chemotherapy: completed 4 cycles dose dense Adriamycin and Cytoxan, completed 10 cycles of Taxol, stopped early due to neuropathy    02/19/2019 - 04/07/2019 Radiation Therapy   Adjuvant XRT The patient initially received a dose of 50.4 Gy in 28 fractions to the breast using whole-breast tangent fields. This was delivered using a 3-D conformal technique. The patient then received a boost to the seroma. This delivered an additional 10 Gy in 5 fractions using a 3-field  photon boost technique. The total dose was 60.4 Gy.   04/2019 -  Anti-estrogen oral therapy   Anastrozole daily     CHIEF COMPLIANT: Follow-up of right breast cancer on anastrozole  INTERVAL HISTORY: [name] [name] is a 54 y.o. with above-mentioned history of right breast cancertreated with lumpectomy, adjuvant chemotherapy, radiation, and who is currently on antiestrogen therapy with anastrozole. Mammogram on 09/05/19 showed no evidence of malignancy bilaterally. Bone density scan on 10/15/19 showed normal bone density with a T-score of 0.4 at the left femur neck. She presents to the clinic today for follow-up.   ALLERGIES:  is allergic to ibuprofen; lisinopril; dilaudid [hydromorphone hcl]; and tape.  MEDICATIONS:  Current Outpatient Medications  Medication Sig Dispense Refill  . acetaminophen (TYLENOL) 325 MG tablet Take 2 tablets (650 mg total) by mouth every 4 (four) hours as needed. 100 tablet 2  . Cholecalciferol (VITAMIN D3) 5000 UNITS TABS Take 1 tablet by mouth daily.    . clobetasol ointment (TEMOVATE) 9.97 % Apply 1 application topically 2 (two) times daily. Then as needed for 30 days  1  . letrozole (FEMARA) 2.5 MG tablet Take 1 tablet (2.5 mg total) by mouth daily. 90 tablet 3  . loratadine (CLARITIN) 10 MG tablet Take 10 mg by mouth daily.    Marland Kitchen omeprazole (PRILOSEC) 40 MG capsule TAKE 1 CAPSULE(40 MG) BY MOUTH DAILY 30 capsule 2  . spironolactone (ALDACTONE) 50 MG tablet TAKE 1 TABLET(50 MG) BY MOUTH DAILY 90 tablet 0  . sulfamethoxazole-trimethoprim (BACTRIM DS) 800-160 MG tablet Take 1 tablet by mouth  2 (two) times daily. 14 tablet 0   No current facility-administered medications for this visit.    PHYSICAL EXAMINATION: ECOG PERFORMANCE STATUS: 1 - Symptomatic but completely ambulatory  Vitals:   10/17/19 1207  BP: (!) 145/84  Pulse: 91  Resp: 17  Temp: 98.1 F (36.7 C)  SpO2: 100%   Filed Weights   10/17/19 1207  Weight: 253 lb 6.4 oz (114.9 kg)     BREAST: No palpable masses or nodules in either right or left breasts. No palpable axillary supraclavicular or infraclavicular adenopathy no breast tenderness or nipple discharge. (exam performed in the presence of a chaperone)  LABORATORY DATA:  I have reviewed the data as listed CMP Latest Ref Rng & Units 09/18/2019 06/09/2019 04/09/2019  Glucose 70 - 99 mg/dL 98 92 98  BUN 6 - 23 mg/dL '13 11 11  '$ Creatinine 0.40 - 1.20 mg/dL 0.98 0.97 0.94  Sodium 135 - 145 mEq/L 139 139 139  Potassium 3.5 - 5.1 mEq/L 4.2 4.4 3.2(L)  Chloride 96 - 112 mEq/L 101 103 102  CO2 19 - 32 mEq/L '31 24 25  '$ Calcium 8.4 - 10.5 mg/dL 10.2 10.0 9.3  Total Protein 6.0 - 8.3 g/dL 7.8 - -  Total Bilirubin 0.2 - 1.2 mg/dL 0.5 - -  Alkaline Phos 39 - 117 U/L 99 - -  AST 0 - 37 U/L 20 - -  ALT 0 - 35 U/L 16 - -    Lab Results  Component Value Date   WBC 5.5 09/18/2019   HGB 14.0 09/18/2019   HCT 41.4 09/18/2019   MCV 86.4 09/18/2019   PLT 137.0 (L) 09/18/2019   NEUTROABS 3.7 09/18/2019    ASSESSMENT & PLAN:  Malignant neoplasm of lower-inner quadrant of right breast of female, estrogen receptor positive ([facility]) 08/23/2018:Right lumpectomy: IDC, 0.7 cm, grade 3, margins negative, 0/3 lymph nodes negative, ER 40%, PR 0%, HER-2 negative, Ki-67 50%, T1BN0 stage Ia  Oncotype Dx 44: High Risk  Treatment plan: 1.Adj Chemo with Dose dense adriamycin and Cytoxan foll by Taxol weekly X 10 stopped early for toxicities 2. Adjuvant radiation therapy 02/20/2019 -03/28/2019  3. Adjuvant antiestrogen therapy ------------------------------------------------------------------------------------------------------------ Port will be removed 04/15/2019 Treatment plan: Adjuvant antiestrogen therapy with anastrozole 1 mg daily Anastrozole toxicities: 1.  Hot flashes: Patient is able to tolerate them. 2.  Muscle aches and pains much better on anastrozole than letrozole that she was on previously.  Breast cancer surveillance: 1.   Breast exam 10/17/2019: Benign 2.  Mammograms January 2021: Benign 3.  CT chest abdomen pelvis: No evidence of metastatic disease.  Fatty liver on the CT scans: I discussed with her about reducing weight as her weight is to decrease breast cancer risk as well as to improve the fatty liver.  Chemo-induced peripheral neuropathy: Stable Mild thrombocytopenia: I discussed with her that this is an effect of prior chemotherapy.  Return to clinic in 1 year for follow-up.      No orders of the defined types were placed in this encounter.  The patient has a good understanding of the overall plan. she agrees with it. she will call with any problems that may develop before the next visit here.  Total time spent: 20 mins including face to face time and time spent for planning, charting and coordination of care  Nicholas Lose, MD 10/17/2019  I, Cloyde Reams Dorshimer, am acting as scribe for Dr. Nicholas Lose.  I have reviewed the above documentation for accuracy and completeness, and I agree  with the above.

## 2019-10-17 ENCOUNTER — Inpatient Hospital Stay: Payer: BC Managed Care – PPO | Attending: Hematology and Oncology | Admitting: Hematology and Oncology

## 2019-10-17 ENCOUNTER — Other Ambulatory Visit: Payer: Self-pay

## 2019-10-17 DIAGNOSIS — Z170 Estrogen receptor positive status [ER+]: Secondary | ICD-10-CM | POA: Insufficient documentation

## 2019-10-17 DIAGNOSIS — Z923 Personal history of irradiation: Secondary | ICD-10-CM | POA: Diagnosis not present

## 2019-10-17 DIAGNOSIS — T451X5A Adverse effect of antineoplastic and immunosuppressive drugs, initial encounter: Secondary | ICD-10-CM | POA: Diagnosis not present

## 2019-10-17 DIAGNOSIS — Z9221 Personal history of antineoplastic chemotherapy: Secondary | ICD-10-CM | POA: Diagnosis not present

## 2019-10-17 DIAGNOSIS — Z79811 Long term (current) use of aromatase inhibitors: Secondary | ICD-10-CM | POA: Diagnosis not present

## 2019-10-17 DIAGNOSIS — K760 Fatty (change of) liver, not elsewhere classified: Secondary | ICD-10-CM | POA: Insufficient documentation

## 2019-10-17 DIAGNOSIS — C50311 Malignant neoplasm of lower-inner quadrant of right female breast: Secondary | ICD-10-CM | POA: Insufficient documentation

## 2019-10-17 DIAGNOSIS — G620 Drug-induced polyneuropathy: Secondary | ICD-10-CM | POA: Diagnosis not present

## 2019-10-17 MED ORDER — LETROZOLE 2.5 MG PO TABS: 2.5000 mg | ORAL_TABLET | Freq: Every day | ORAL | 3 refills | Status: DC

## 2019-10-17 NOTE — Assessment & Plan Note (Signed)
08/23/2018:Right lumpectomy: IDC, 0.7 cm, grade 3, margins negative, 0/3 lymph nodes negative, ER 40%, PR 0%, HER-2 negative, Ki-67 50%, T1BN0 stage Ia  Oncotype Dx 44: High Risk  Treatment plan: 1.Adj Chemo with Dose dense adriamycin and Cytoxan foll by Taxol weekly X 10 stopped early for toxicities 2. Adjuvant radiation therapy 02/20/2019 -03/28/2019  3. Adjuvant antiestrogen therapy ------------------------------------------------------------------------------------------------------------ Port will be removed 04/15/2019 Treatment plan: Adjuvant antiestrogen therapy with anastrozole 1 mg daily Anastrozole toxicities: 1.  Hot flashes: Patient is able to tolerate them. 2.  Muscle aches and pains much better on anastrozole than letrozole that she was on previously.  Breast cancer surveillance: 1.  Breast exam 10/17/2019: Benign 2.  Mammograms January 2021: Benign 3.  CT chest abdomen pelvis: No evidence of metastatic disease.  Fatty liver on the CT scans: I discussed with her about reducing weight as her weight is to decrease breast cancer risk as well as to improve the fatty liver.  Chemo-induced peripheral neuropathy: Stable Mild thrombocytopenia: I discussed with her that this is an effect of prior chemotherapy.  Return to clinic in 1 year for follow-up.

## 2019-10-20 ENCOUNTER — Telehealth: Payer: Self-pay | Admitting: Hematology and Oncology

## 2019-10-20 NOTE — Telephone Encounter (Signed)
I talk with patient regarding schedule  

## 2019-10-22 ENCOUNTER — Other Ambulatory Visit: Payer: Self-pay

## 2019-10-22 ENCOUNTER — Ambulatory Visit: Payer: BC Managed Care – PPO | Admitting: Physician Assistant

## 2019-10-22 ENCOUNTER — Encounter: Payer: Self-pay | Admitting: Physician Assistant

## 2019-10-22 VITALS — BP 140/80 | HR 72 | Temp 98.3°F | Ht 66.0 in | Wt 251.6 lb

## 2019-10-22 DIAGNOSIS — L290 Pruritus ani: Secondary | ICD-10-CM

## 2019-10-22 DIAGNOSIS — K648 Other hemorrhoids: Secondary | ICD-10-CM

## 2019-10-22 MED ORDER — HYDROCORTISONE ACETATE 25 MG RE SUPP: RECTAL | 6 refills | Status: DC

## 2019-10-22 NOTE — Progress Notes (Signed)
Subjective:    Patient ID: [name] [name], female    DOB: 12/08/1965, 54 y.o.   MRN: [id number]  HPI Warner is a pleasant 54 year old female known to Dr. Hilarie Fredrickson.  She has history of hypertension, GERD, breast cancer which was diagnosed in 2019, she is status post lumpectomy and chemotherapy/radiation. She was last seen in the office a little over a month ago by myself at that time with complaints of right sided abdominal pain both upper and lower.  She was also having some discomfort up into her right back.  She underwent CT scan of the chest abdomen and pelvis which were unremarkable.  She has normal-appearing gallbladder, hepatic steatosis, no evidence of recurrent breast cancer or bone disease.  She had been started on Prilosec 40 mg p.o. daily at that same time. She comes in today saying that the abdominal pain has resolved and she is feeling much better from that standpoint. She had mentioned that she was having some rectal itching at the last office visit.  She was asked to try RectiCare complete OTC.  She had called back and stated that that was not helping.  We then called her in a prescription for Anusol [facility] suppositories she said helped a little bit for nighttime symptoms symptoms have persisted during the daytime.  She says she is having fairly constant itching which is annoying, she is not having any bleeding but has had some bulging of hemorrhoids at times especially with bowel movement feeling as if she has a hemorrhoid that is prolapsing. She had undergone hemorrhoidal banding per Dr. Hilarie Fredrickson in 2016.  She says that that was very effective for a long period of time and she had been having a lot of pruritus at that time as well.  She is not opposed to repeat banding if it will help her symptoms.  She had colonoscopy in January 2019 which was negative.   Review of Systems Pertinent positive and negative review of systems were noted in the above HPI section.  All other review of  systems was otherwise negative.  Outpatient Encounter Medications as of 10/22/2019  Medication Sig  . acetaminophen (TYLENOL) 325 MG tablet Take 2 tablets (650 mg total) by mouth every 4 (four) hours as needed.  . Cholecalciferol (VITAMIN D3) 5000 UNITS TABS Take 1 tablet by mouth daily.  . clobetasol ointment (TEMOVATE) [id number] % Apply 1 application topically 2 (two) times daily. Then as needed for 30 days  . letrozole (FEMARA) 2.5 MG tablet Take 1 tablet (2.5 mg total) by mouth daily.  Marland Kitchen loratadine (CLARITIN) 10 MG tablet Take 10 mg by mouth daily.  Marland Kitchen omeprazole (PRILOSEC) 40 MG capsule TAKE 1 CAPSULE(40 MG) BY MOUTH DAILY  . spironolactone (ALDACTONE) 50 MG tablet TAKE 1 TABLET(50 MG) BY MOUTH DAILY  . sulfamethoxazole-trimethoprim (BACTRIM DS) 800-160 MG tablet Take 1 tablet by mouth 2 (two) times daily.  . hydrocortisone (ANUSOL-HC) 25 MG suppository Use rectally twice daily x 2 weeks then as needed  . [DISCONTINUED] prochlorperazine (COMPAZINE) 10 MG tablet Take 1 tablet (10 mg total) by mouth every 6 (six) hours as needed (Nausea or vomiting).   No facility-administered encounter medications on file as of 10/22/2019.   Allergies  Allergen Reactions  . Ibuprofen Swelling    SWELLING REACTION UNSPECIFIED   . Lisinopril Swelling    angioedema  . Dilaudid [Hydromorphone Hcl] Nausea Only  . Tape Rash   Patient Active Problem List   Diagnosis Date Noted  . Leg edema  05/31/2019  . Port-A-Cath in place 09/25/2018  . Malignant neoplasm of lower-inner quadrant of right breast of female, estrogen receptor positive ([place]) 07/16/2018  . Special screening for malignant neoplasms, colon 07/10/2017  . Anal fissure 07/07/2014  . Pruritic rash 06/16/2014  . Obesity 06/16/2014  . Primary localized osteoarthrosis, lower leg 11/18/2013  . Bilateral knee pain 09/08/2013  . VITAMIN B12 DEFICIENCY 02/10/2010  . Essential hypertension 08/16/2007  . ALLERGIC RHINITIS 08/16/2007  . GERD 08/16/2007  .  Right upper quadrant pain 05/13/2007   Social History   Socioeconomic History  . Marital status: Divorced    Spouse name: Not on file  . Number of children: 3  . Years of education: 36  . Highest education level: Not on file  Occupational History    Employer: NOT EMPLOYED  Tobacco Use  . Smoking status: Never Smoker  . Smokeless tobacco: Never Used  Substance and Sexual Activity  . Alcohol use: No    Alcohol/week: 0.0 standard drinks  . Drug use: No  . Sexual activity: Not Currently    Birth control/protection: Post-menopausal  Other Topics Concern  . Not on file  Social History Narrative   [employer], [employer] - Human service/sociology. Married '2000 - 59yrs/seperated. 2 boys - '82, '87, 1 dtr - '81.   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    Social Determinants of Health   Financial Resource Strain:   . Difficulty of Paying Living Expenses: Not on file  Food Insecurity:   . Worried About Charity fundraiser in the Last Year: Not on file  . Ran Out of Food in the Last Year: Not on file  Transportation Needs:   . Lack of Transportation (Medical): Not on file  . Lack of Transportation (Non-Medical): Not on file  Physical Activity:   . Days of Exercise per Week: Not on file  . Minutes of Exercise per Session: Not on file  Stress:   . Feeling of Stress : Not on file  Social Connections:   . Frequency of Communication with Friends and Family: Not on file  . Frequency of Social Gatherings with Friends and Family: Not on file  . Attends Religious Services: Not on file  . Active Member of Clubs or Organizations: Not on file  . Attends Archivist Meetings: Not on file  . Marital Status: Not on file  Intimate Partner Violence:   . Fear of Current or Ex-Partner: Not on file  . Emotionally Abused: Not on file  . Physically Abused: Not on file  . Sexually Abused: Not on file    Ms. Gothard's family history includes Breast cancer (age of onset: 50) in her mother; Heart attack in her  father; Heart disease in her father; Hypertension in her father and mother.      Objective:    Vitals:   10/22/19 0904  BP: 140/80  Pulse: 72  Temp: 98.3 F (36.8 C)    Physical Exam Well-developed well-nourished African-American female in no acute distress.  Pleasant height, [place] 251 ht, BMI 40.6  HEENT; nontraumatic normocephalic, EOMI, PE RR LA, sclera anicteric. Oropharynx; not examined/mask/Covid Neck; supple, no JVD Cardiovascular; regular rate and rhythm with S1-S2, no murmur rub or gallop Pulmonary; Clear bilaterally Abdomen; soft, obese, nontender, nondistended, no palpable mass or hepatosplenomegaly, bowel sounds are active Rectal; no external lesions or skin irritation noted, she is nontender to digital exam, no palpable fissure, on anoscopy she does have at least 2 internal hemorrhoids Skin; benign exam, no  jaundice rash or appreciable lesions Extremities; no clubbing cyanosis or edema skin warm and dry Neuro/Psych; alert and oriented x4, grossly nonfocal mood and affect appropriate       Assessment & Plan:   #53  54 year old female with multiple week history of persistent anal pruritus refractory to RectiCare complete and Anusol HC suppositories. On anoscopy she does have recurrent internal hemorrhoids which are very likely the culprit.  #2 Recent right upper quadrant/right mid quadrant abdominal pain-resolved  Plan; patient will be scheduled for repeat hemorrhoidal banding with Dr. Hilarie Fredrickson. In the interim will refill Anusol HC suppositories and have her increase to twice daily over the next 10 to 14 days.  Annet Manukyan S Aneisa Karren PA-C 10/22/2019   Cc: [name], [name], MD

## 2019-10-22 NOTE — Patient Instructions (Signed)
We have sent the following medications to your pharmacy for you to pick up at your convenience: Anusol suppositories.   We have scheduled your banding appointment with Dr. Hilarie Fredrickson on 10/30/19 at 4:00pm

## 2019-10-28 NOTE — Progress Notes (Signed)
Addendum: Reviewed and agree with assessment and management plan. Johnathan Tortorelli M, MD  

## 2019-10-30 ENCOUNTER — Ambulatory Visit: Payer: BC Managed Care – PPO | Admitting: Internal Medicine

## 2019-10-30 ENCOUNTER — Encounter: Payer: Self-pay | Admitting: Internal Medicine

## 2019-10-30 VITALS — BP 140/84 | HR 101 | Temp 97.5°F | Ht 66.0 in | Wt 254.0 lb

## 2019-10-30 DIAGNOSIS — R151 Fecal smearing: Secondary | ICD-10-CM

## 2019-10-30 DIAGNOSIS — K648 Other hemorrhoids: Secondary | ICD-10-CM | POA: Diagnosis not present

## 2019-10-30 NOTE — Progress Notes (Signed)
[name] [name] is a 54 year old female with a past medical history of GERD, colonic diverticulosis, constipation internal hemorrhoids treated with hemorrhoidal banding in mid 2016 who was seen recently with symptomatic internal hemorrhoids.  She returns today to consider hemorrhoidal banding  Constipation has improved with MiraLAX and if she is consistent with this her stools are soft and there is no need for straining.  She has not seen bleeding in her stools or melena.  She does deal with perianal itching which can be intense, perianal swelling and prolapse and fecal smearing.  She has smearing between bowel movements. RectiCare has not been helpful at all for itching   PROCEDURE NOTE:  The patient presents with symptomatic grade 2 internal hemorrhoids, requesting rubber band ligation of  her hemorrhoidal disease.  All risks, benefits and alternative forms of therapy were described and informed consent was obtained.   The anorectum was pre-medicated with 0.125% nitroglycerin ointment  ANOSCOPY: Using a disposable, lubricated, slotted, self-illuminating anoscope, the rectum was intubated without difficulty. The trochar was removed and the ano-rectum was circumferentially inspected. There were internal hemorrhoids, RA=LL>RP. There was no finding of an anorectal fissure. The rectal mucosa was not inflamed. No neoplasia or other pathology was identified. The inspection was well tolerated.    The decision was made to band the RA internal hemorrhoid, and the [place] was used to perform band ligation without complication.   Digital anorectal examination was then performed to assure proper positioning of the band, and to adjust the banded tissue as required.  The patient was discharged home without pain or other issues.  Dietary and behavioral recommendations were given and along with follow-up instructions.     The following adjunctive treatments were recommended: --Continue MiraLAX 17 g  a day --Add Desitin as a barrier and natural antifungal between bowel movements and bathing --Return for additional banding  The patient will return as scheduled for  follow-up and possible additional banding as required. No complications were encountered and the patient tolerated the procedure well.

## 2019-10-30 NOTE — Patient Instructions (Addendum)
Please look over the food triggers for rectal itching. A sheet was given to you for this.  Please purchase the following medications over the counter and take as directed: Desitin  You have been scheduled for 2nd hemorrhoidal banding on 11/25/19 at 4:00 pm.  HEMORRHOID BANDING PROCEDURE    FOLLOW-UP CARE   1. The procedure you have had should have been relatively painless since the banding of the area involved does not have nerve endings and there is no pain sensation.  The rubber band cuts off the blood supply to the hemorrhoid and the band may fall off as soon as 48 hours after the banding (the band may occasionally be seen in the toilet bowl following a bowel movement). You may notice a temporary feeling of fullness in the rectum which should respond adequately to plain Tylenol or Motrin.  2. Following the banding, avoid strenuous exercise that evening and resume full activity the next day.  A sitz bath (soaking in a warm tub) or bidet is soothing, and can be useful for cleansing the area after bowel movements.     3. To avoid constipation, take two tablespoons of natural wheat bran, natural oat bran, flax, Benefiber or any over the counter fiber supplement and increase your water intake to 7-8 glasses daily.    4. Unless you have been prescribed anorectal medication, do not put anything inside your rectum for two weeks: No suppositories, enemas, fingers, etc.  5. Occasionally, you may have more bleeding than usual after the banding procedure.  This is often from the untreated hemorrhoids rather than the treated one.  Don't be concerned if there is a tablespoon or so of blood.  If there is more blood than this, lie flat with your bottom higher than your head and apply an ice pack to the area. If the bleeding does not stop within a half an hour or if you feel faint, call our office at [phone] or go to the emergency room.  6. Problems are not common; however, if there is a  substantial amount of bleeding, severe pain, chills, fever or difficulty passing urine (very rare) or other problems, you should call us at [phone] or report to the nearest emergency room.  7. Do not stay seated continuously for more than 2-3 hours for a day or two after the procedure.  Tighten your buttock muscles 10-15 times every two hours and take 10-15 deep breaths every 1-2 hours.  Do not spend more than a few minutes on the toilet if you cannot empty your bowel; instead re-visit the toilet at a later time.

## 2019-10-31 ENCOUNTER — Telehealth: Payer: Self-pay | Admitting: Internal Medicine

## 2019-10-31 NOTE — Telephone Encounter (Signed)
Pyrtle pt had banding yesterday. States she had a little discomfort following the banding and was told to call back this am if it continued. She took 2 tylenol last night at 10pm and it helped. This morning she states she is still having an aching pain. She took 2 more tylenol at 5am and it eased off but is starting to come back. Please advise as DOD.

## 2019-10-31 NOTE — Telephone Encounter (Signed)
This should get better and from what I am reading it is not disabling? (if so let me know)  Tylenol or ibuprofen (400 mg) ok to use/alternate  Warm sitz baths also should be used

## 2019-10-31 NOTE — Telephone Encounter (Signed)
Pt had hemorrhoid banding 10/30/19 and reported that she is still in pain.  Please advise.

## 2019-10-31 NOTE — Telephone Encounter (Signed)
Spoke with pt and she is aware.

## 2019-11-25 ENCOUNTER — Ambulatory Visit: Payer: BC Managed Care – PPO | Admitting: Internal Medicine

## 2019-11-25 ENCOUNTER — Encounter: Payer: Self-pay | Admitting: Internal Medicine

## 2019-11-25 VITALS — BP 142/86 | HR 108 | Temp 97.7°F | Ht 66.0 in | Wt 255.0 lb

## 2019-11-25 DIAGNOSIS — K5909 Other constipation: Secondary | ICD-10-CM | POA: Diagnosis not present

## 2019-11-25 DIAGNOSIS — K648 Other hemorrhoids: Secondary | ICD-10-CM

## 2019-11-25 DIAGNOSIS — R151 Fecal smearing: Secondary | ICD-10-CM | POA: Diagnosis not present

## 2019-11-25 DIAGNOSIS — L290 Pruritus ani: Secondary | ICD-10-CM | POA: Diagnosis not present

## 2019-11-25 MED ORDER — CLOTRIMAZOLE-BETAMETHASONE 1-0.05 % EX CREA: 1.0000 "application " | TOPICAL_CREAM | Freq: Two times a day (BID) | CUTANEOUS | 0 refills | Status: DC | PRN

## 2019-11-25 NOTE — Progress Notes (Signed)
   Subjective:    Patient ID: Loann Quill, female    DOB: 01-25-1966, 54 y.o.   MRN: [id number]  HPI Norrene Whalley is a 54 year old female with a history of symptomatic internal hemorrhoids, chronic constipation, GERD, colonic diverticulosis who returns for follow-up.  I saw her on 10/30/2019 for hemorrhoidal banding for symptomatic internal hemorrhoids.  We also discussed her constipation at that time.  She had previously undergone banding in 2016 but has had recurrent perianal itching, perianal swelling and prolapse and fecal smearing.  She reports that the banding went well on the day of the procedure.  I banded the right anterior internal hemorrhoid however the day after she developed intense rectal pain.  She called and got the advice of Dr. Carlean Purl.  She used sitz bath's and over-the-counter pain relievers.  The pain did improve after 3 or 4 days.  Fortunately her symptoms are considerably better.  The perianal swelling, discomfort and prolapse have resolved.  Her itching is considerably better though not completely gone.  She tried Desitin but the "wetness" if this was uncomfortable to her.  She is using MiraLAX daily and while constipation is better she is having flatulence and bloating symptom.   Review of Systems As per HPI, otherwise negative  Current Medications, Allergies, Past Medical History, Past Surgical History, Family History and Social History were reviewed in [employer] record.     Objective:   Physical Exam BP (!) 142/86   Pulse (!) 108   Temp 97.7 F (36.5 C)   Ht 5\' 6"  (1.676 m)   Wt 255 lb (115.7 kg)   LMP 09/13/2015 Comment: celebate, BTL  BMI 41.16 kg/m  Gen: awake, alert, NAD Neuro: nonfocal     Assessment & Plan:  54 year old female with a history of symptomatic internal hemorrhoids, chronic constipation, GERD, colonic diverticulosis who returns for follow-up.  I saw her on 10/30/2019 for hemorrhoidal banding for  symptomatic internal hemorrhoids.   1.  Symptomatic internal hemorrhoids/perianal itch --internal hemorrhoid symptoms have improved after initial banding.  The pain associated with the banding procedure, while unexpected, fortunately resolved.  Given improvement I do not think we need to repeat hemorrhoidal banding today but this is an option in the future if hemorrhoidal symptoms recur.  Should they recur I would recommend banding the left lateral internal hemorrhoid change. --Lotrisone cream twice daily on an as-needed basis for perianal itching; I advised to use this sparingly --Repeat hemorrhoidal banding when and if symptoms warrant --Controlling constipation as below will also help reduce recurrent hemorrhoidal symptoms  2.  Chronic constipation/gas --MiraLAX may be contributing some to her intestinal gas though it is helping her constipation.  I recommended she try to reduce the dose of MiraLAX to half dose, 8 g, daily or 17 g every other day to see if gas improves.  Also recommended she try align 1 capsule daily as a probiotic. --MiraLAX as above --Begin probiotic 1 capsule daily for 8 to 12 weeks to see if intestinal gas improves  3.  CRC screening --normal colonoscopy in 2019, repeat would be in 10 years  Follow-up as needed  20 minutes total spent today including patient facing time, coordination of care, reviewing medical history/procedures/pertinent radiology studies, and documentation of the encounter.

## 2019-11-25 NOTE — Patient Instructions (Signed)
Please purchase the following medications over the counter and take as directed: Align once daily  Please titrate your Miralax as needed to see if it helps your gas at all  Discontinue Desitin.  We have sent the following medications to your pharmacy for you to pick up at your convenience: Lotrisone cream twice daily as needed for perianal itch   Please follow up as needed   If you are age 56 or older, your body mass index should be between 23-30. Your Body mass index is 41.16 kg/m. If this is out of the aforementioned range listed, please consider follow up with your Primary Care Provider.  If you are age 14 or younger, your body mass index should be between 19-25. Your Body mass index is 41.16 kg/m. If this is out of the aformentioned range listed, please consider follow up with your Primary Care Provider.   Due to recent changes in healthcare laws, you may see the results of your imaging and laboratory studies on MyChart before your provider has had a chance to review them.  We understand that in some cases there may be results that are confusing or concerning to you. Not all laboratory results come back in the same time frame and the provider may be waiting for multiple results in order to interpret others.  Please give [name] 48 hours in order for your provider to thoroughly review all the results before contacting the office for clarification of your results.

## 2019-12-05 ENCOUNTER — Ambulatory Visit: Payer: BC Managed Care – PPO | Admitting: Cardiology

## 2019-12-05 ENCOUNTER — Encounter: Payer: Self-pay | Admitting: Cardiology

## 2019-12-05 ENCOUNTER — Other Ambulatory Visit: Payer: Self-pay

## 2019-12-05 VITALS — BP 140/91 | HR 95 | Temp 98.0°F | Resp 17 | Ht 66.0 in | Wt 253.0 lb

## 2019-12-05 DIAGNOSIS — M79602 Pain in left arm: Secondary | ICD-10-CM | POA: Insufficient documentation

## 2019-12-05 DIAGNOSIS — I10 Essential (primary) hypertension: Secondary | ICD-10-CM

## 2019-12-05 NOTE — Progress Notes (Signed)
Patient referred by Leonides Sake, MD for leg edema  Subjective:   [name] [name], female    DOB: 11/25/1965, 54 y.o.   MRN: [id number]   Chief Complaint  Patient presents with  . Hypertension  . Arm Pain    left     HPI  54 y.o. African American female with hypertension, migraines, h/o invasive ductal carcinoma s/p Rt lumpectomy, chemotherapy and radiation, now with left arm pain  Left arm pain is focal, not radiating to chest, present at rest, worse at night and associated with numbness.  Patient does not have any of the symptoms during her physical exercise activity including kickboxing.  Patient is working with her PCP on her hypertension.  Current Outpatient Medications on File Prior to Visit  Medication Sig Dispense Refill  . acetaminophen (TYLENOL) 325 MG tablet Take 2 tablets (650 mg total) by mouth every 4 (four) hours as needed. 100 tablet 2  . Cholecalciferol (VITAMIN D3) 5000 UNITS TABS Take 1 tablet by mouth daily.    . clobetasol ointment (TEMOVATE) 6.27 % Apply 1 application topically 2 (two) times daily. Then as needed for 30 days  1  . clotrimazole-betamethasone (LOTRISONE) cream Apply 1 application topically 2 (two) times daily as needed. 30 g 0  . hydrocortisone (ANUSOL-HC) 25 MG suppository Use rectally twice daily x 2 weeks then as needed 12 suppository 6  . letrozole (FEMARA) 2.5 MG tablet Take 1 tablet (2.5 mg total) by mouth daily. 90 tablet 3  . loratadine (CLARITIN) 10 MG tablet Take 10 mg by mouth daily.    Marland Kitchen omeprazole (PRILOSEC) 40 MG capsule TAKE 1 CAPSULE(40 MG) BY MOUTH DAILY 30 capsule 2  . spironolactone (ALDACTONE) 50 MG tablet TAKE 1 TABLET(50 MG) BY MOUTH DAILY 90 tablet 0  . sulfamethoxazole-trimethoprim (BACTRIM DS) 800-160 MG tablet Take 1 tablet by mouth 2 (two) times daily. 14 tablet 0  . [DISCONTINUED] prochlorperazine (COMPAZINE) 10 MG tablet Take 1 tablet (10 mg total) by mouth every 6 (six) hours as needed (Nausea or  vomiting). 30 tablet 1   No current facility-administered medications on file prior to visit.    Cardiovascular studies:  EKG 12/05/2019: Sinus rhythm 92 bpm. First degree AV block.  Poor R-wave progression.  EKG 05/30/2019: Sinus rhythm 89 bpm.  Poor R-wave progression. Nonspecific ST-T changes.  Echocardiogram 08/2018: - Left ventricle: The cavity size was normal. Wall thickness was   normal. Systolic function was normal. The estimated ejection   fraction was in the range of 60% to 65%. Wall motion was normal;   there were no regional wall motion abnormalities. Left   ventricular diastolic function parameters were normal. - Left atrium: The atrium was mildly dilated. - Atrial septum: No defect or patent foramen ovale was identified. - Impressions: Normal GLS -20.  Impressions:  - Normal GLS -20.  Recent labs: 08/29/2019: Glucose 98, BUN/Cr 13/0.98. EGFR 71. Na/K 139/4.2. Rest of the CMP normal H/H 14/41. MCV 86. Platelets 137  2015: Chol 180, TG 97, HDL 40, LDL 120  Review of Systems  Cardiovascular: Negative for chest pain, dyspnea on exertion, leg swelling, palpitations and syncope.  Musculoskeletal: Positive for back pain.       Left arm pain        Vitals:   12/05/19 1404 12/05/19 1412  BP: (!) 149/90 (!) 140/91  Pulse: 93 95  Resp: 17   Temp: 98 F (36.7 C)   SpO2: 98%      Body  mass index is 40.84 kg/m. Filed Weights   12/05/19 1404  Weight: 253 lb (114.8 kg)     Objective:   Physical Exam  Neck: No JVD present.  Cardiovascular: Normal rate, regular rhythm, normal heart sounds and intact distal pulses.  No murmur heard. Pulmonary/Chest: Effort normal and breath sounds normal. She has no wheezes. She has no rales.  Musculoskeletal:        General: No edema.     Comments: Left arm tenderness  Nursing note and vitals reviewed.         Assessment & Recommendations:   54 y.o. African American female with hypertension, migraines, h/o  invasive ductal carcinoma s/p Rt lumpectomy, chemotherapy and radiation, now with left arm pain  Left arm pain: Not angina.  Does not require ischemic testing at this time.  Consider muscle skeletal etiology.  Hypertension: Better control.  Continue management as per PCP.  I will see her on as-needed basis.  Nigel Mormon, MD [facility] Pager: [phone] Office: [phone] If no answer Cell [phone]

## 2019-12-08 ENCOUNTER — Other Ambulatory Visit: Payer: Self-pay

## 2019-12-09 ENCOUNTER — Other Ambulatory Visit: Payer: Self-pay | Admitting: Hematology and Oncology

## 2019-12-10 ENCOUNTER — Encounter: Payer: BC Managed Care – PPO | Admitting: Obstetrics and Gynecology

## 2019-12-11 ENCOUNTER — Other Ambulatory Visit: Payer: Self-pay | Admitting: Nurse Practitioner

## 2020-02-12 ENCOUNTER — Ambulatory Visit: Payer: BC Managed Care – PPO | Admitting: Internal Medicine

## 2020-02-12 ENCOUNTER — Encounter: Payer: Self-pay | Admitting: Internal Medicine

## 2020-02-12 VITALS — BP 118/86 | HR 99 | Ht 66.0 in | Wt 250.4 lb

## 2020-02-12 DIAGNOSIS — L290 Pruritus ani: Secondary | ICD-10-CM

## 2020-02-12 DIAGNOSIS — K648 Other hemorrhoids: Secondary | ICD-10-CM | POA: Diagnosis not present

## 2020-02-12 DIAGNOSIS — R140 Abdominal distension (gaseous): Secondary | ICD-10-CM

## 2020-02-12 NOTE — Progress Notes (Signed)
Subjective:    Patient ID: [name] [name], female    DOB: 1966-05-18, 54 y.o.   MRN: [id number]  HPI [name] [name] is a 54 year old female with a history of symptomatic internal hemorrhoids with perianal itch, chronic constipation, GERD, colonic diverticulosis who is here for follow-up.  She is here alone today and I last saw her on 11/25/2019.  She reports that she continues to have issues with perianal itching which can be intense at times.  This seems to be worse on the left side of the anal canal.  She does at times feel bulging and irritation at the left anus.  She has backed off of MiraLAX on advice from primary care and is using fiber supplement daily.  She is still using a small amount of MiraLAX every other day.  Bowel movements have normalized without constipation.  She is having a daily bowel movement which is formed and not requiring straining.  No rectal bleeding.  She started Align daily and this is helped with gas and bloating.  She is using Desitin which helps the itching at night for a little bit.  She also was using periodic hydrocortisone suppositories prescribed previously by Nicoletta Ba, PA-C.  This also seems to help a little.  She had rubber band therapy to the right anterior internal hemorrhoid on 10/30/2019.  Prior to this she had hemorrhoidal banding in 2016 to all 3 internal hemorrhoid columns over 3 visits.  Anoscopy earlier this year showed internal hemorrhoids greatest right anterior and left lateral.   Review of Systems As per HPI, otherwise negative  Current Medications, Allergies, Past Medical History, Past Surgical History, Family History and Social History were reviewed in [employer] record.     Objective:   Physical Exam BP 118/86 (BP Location: Left Arm, Patient Position: Sitting, Cuff Size: Large)   Pulse 99   Ht 5\' 6"  (1.676 m)   Wt 250 lb 6 oz (113.6 kg)   LMP 09/13/2015 Comment: celebate, BTL  SpO2 99%   BMI 40.41  kg/m  Gen: awake, alert, NAD HEENT: anicteric Neuro: nonfocal      Assessment & Plan:  54 year old female with a history of symptomatic internal hemorrhoids with perianal itch, chronic constipation, GERD, colonic diverticulosis who is here for follow-up.  1.  Symptomatic internal hemorrhoid/perianal itching --we discussed further hemorrhoidal banding today which I do think would help the left lateral bulging and perianal itching.  However her most recent banding in March was uncomfortable and she would prefer to try dietary modification first.  We discussed dietary irritants leading to perianal itch today and I would like her to strictly avoid these foods for 1 month.  She can continue Desitin to the perianal skin as needed.  She will also continue to work on bowel habits with daily fiber and MiraLAX as needed.  Continue Align daily which is help with abdominal bloating and gas. --Continue fiber supplement daily and MiraLAX if needed --Desitin to the perianal skin when needed --She will remain off of hydrocortisone suppositories --Dietary modifications specifically avoid all coffee, cola, beer, tomatoes, chocolate, tea and citrus fruits x1 month to see if this improves perianal itch --If persistent symptoms I would recommend banding the left lateral internal hemorrhoid  2.  Abdominal gas and bloating --improved on probiotic, continue align once daily  3. CRC screening --normal colonoscopy in 2019, repeat in 2029  20 minutes total spent today including patient facing time, coordination of care, reviewing medical history/procedures/pertinent radiology studies,  and documentation of the encounter.

## 2020-02-12 NOTE — Patient Instructions (Signed)
Continue Align.  Continue your fiber supplement.  Continue Miralax.  Use Desitin as needed.  Discontinue suppositories.  Please follow the perianal itching diet we have given you today.  If you are age 56 or older, your body mass index should be between 23-30. Your Body mass index is 40.41 kg/m. If this is out of the aforementioned range listed, please consider follow up with your Primary Care Provider.  If you are age 61 or younger, your body mass index should be between 19-25. Your Body mass index is 40.41 kg/m. If this is out of the aformentioned range listed, please consider follow up with your Primary Care Provider.

## 2020-02-16 ENCOUNTER — Other Ambulatory Visit: Payer: Self-pay

## 2020-02-16 ENCOUNTER — Encounter: Payer: Self-pay | Admitting: Podiatry

## 2020-02-16 ENCOUNTER — Ambulatory Visit (INDEPENDENT_AMBULATORY_CARE_PROVIDER_SITE_OTHER): Payer: BC Managed Care – PPO

## 2020-02-16 ENCOUNTER — Ambulatory Visit: Payer: BC Managed Care – PPO | Admitting: Podiatry

## 2020-02-16 VITALS — Temp 97.0°F

## 2020-02-16 DIAGNOSIS — M779 Enthesopathy, unspecified: Secondary | ICD-10-CM

## 2020-02-16 DIAGNOSIS — M25571 Pain in right ankle and joints of right foot: Secondary | ICD-10-CM | POA: Diagnosis not present

## 2020-02-16 DIAGNOSIS — M79671 Pain in right foot: Secondary | ICD-10-CM

## 2020-02-17 ENCOUNTER — Other Ambulatory Visit: Payer: Self-pay | Admitting: Podiatry

## 2020-02-17 DIAGNOSIS — M779 Enthesopathy, unspecified: Secondary | ICD-10-CM

## 2020-02-18 ENCOUNTER — Telehealth: Payer: Self-pay | Admitting: Internal Medicine

## 2020-02-18 NOTE — Telephone Encounter (Signed)
Patient called states she is having rectal bleeding

## 2020-02-19 NOTE — Progress Notes (Signed)
Subjective:   Patient ID: [name] [name], female   DOB: 53 y.o.   MRN: [id number]   HPI Patient states she had a lot of pain in her right ankle and it made it hard for her to be active and she is tried different modalities without relief of symptoms.  Patient states that she does not remember injury   ROS      Objective:  Physical Exam  None neurovascular status intact with inflammation pain in the right sinus tarsi with fluid buildup noted and pain to palpation     Assessment:  Inflammatory sinus tarsitis right with inflammation fluid buildup     Plan:  H&P condition reviewed sterile prep done injected the sinus tarsi right 3 mg Kenalog 5 mg Xylocaine applied fascial brace to restrict motion and reappoint to recheck  X-rays indicated no indications of advanced arthritis or stress fracture associated with condition

## 2020-02-19 NOTE — Telephone Encounter (Signed)
Left message for pt to call back  °

## 2020-02-20 NOTE — Telephone Encounter (Signed)
Spoke with pt and she states she had asked to speak directly to Dr. Hilarie Fredrickson, pt states he told her to call back and speak with him if she had problems. Discussed with pt that the nurse calls the pt to find out their concerns and then consults with the doctor to find out what they recommend. Pt states she was just doing what Dr. Hilarie Fredrickson told her and she is better now and does not need anything.

## 2020-02-25 ENCOUNTER — Telehealth: Payer: Self-pay | Admitting: Internal Medicine

## 2020-02-25 NOTE — Telephone Encounter (Signed)
Spoke with patient, pt reports that every time she has a bowel movement she is bleeding. She has internal hemorrhoids, was seen about a week and half ago. Pt states that the toilet bowl water is bright red along with blood on the toilet paper. Currently taking Fiber, Miralax every other day and Desitin as needed. Pt states that she was told to call back with an update if symptoms did not improve, per last office note if persistent symptoms you would recommend banding the left lateral internal hemorrhoid. Next available for a hem band is 04/19/20 ast 3:40 pm. Please advise, thank you

## 2020-02-25 NOTE — Telephone Encounter (Signed)
Patient called states she keeps bleeding every time she has a BM please advise.

## 2020-02-26 NOTE — Telephone Encounter (Signed)
Spoke with patient, pt agreeable to have repeat banding, pt aware that bleeding is unfortunately a symptom of internal hemorrhoids, pt advised that if she has severe bleeding or develops black tarry stools that she will need to go to the ED. Pt scheduled hem banding for 04/19/20 at 3:40 pm.

## 2020-02-26 NOTE — Telephone Encounter (Signed)
If pt agreeable to repeat banding, okay for banding appt on 04/19/20 Thanks JMP

## 2020-04-01 ENCOUNTER — Other Ambulatory Visit: Payer: Self-pay | Admitting: Hematology and Oncology

## 2020-04-19 ENCOUNTER — Ambulatory Visit: Payer: BC Managed Care – PPO | Admitting: Internal Medicine

## 2020-05-14 ENCOUNTER — Other Ambulatory Visit: Payer: Self-pay | Admitting: Hematology and Oncology

## 2020-05-26 ENCOUNTER — Ambulatory Visit: Payer: BC Managed Care – PPO | Attending: Nurse Practitioner | Admitting: Nurse Practitioner

## 2020-05-26 ENCOUNTER — Other Ambulatory Visit: Payer: Self-pay

## 2020-05-26 ENCOUNTER — Telehealth: Payer: Self-pay

## 2020-05-26 NOTE — Telephone Encounter (Signed)
Please see CRM

## 2020-06-21 ENCOUNTER — Other Ambulatory Visit: Payer: Self-pay

## 2020-06-21 ENCOUNTER — Emergency Department (HOSPITAL_COMMUNITY)
Admission: EM | Admit: 2020-06-21 | Discharge: 2020-06-21 | Disposition: A | Discharge status: Home / Self Care | Payer: BC Managed Care – PPO | Attending: Emergency Medicine | Admitting: Emergency Medicine

## 2020-06-21 ENCOUNTER — Encounter (HOSPITAL_COMMUNITY): Payer: Self-pay

## 2020-06-21 ENCOUNTER — Emergency Department (HOSPITAL_COMMUNITY): Payer: BC Managed Care – PPO

## 2020-06-21 DIAGNOSIS — K760 Fatty (change of) liver, not elsewhere classified: Secondary | ICD-10-CM | POA: Insufficient documentation

## 2020-06-21 DIAGNOSIS — I10 Essential (primary) hypertension: Secondary | ICD-10-CM | POA: Insufficient documentation

## 2020-06-21 DIAGNOSIS — R1011 Right upper quadrant pain: Secondary | ICD-10-CM | POA: Diagnosis present

## 2020-06-21 DIAGNOSIS — Z853 Personal history of malignant neoplasm of breast: Secondary | ICD-10-CM | POA: Diagnosis not present

## 2020-06-21 DIAGNOSIS — Z79899 Other long term (current) drug therapy: Secondary | ICD-10-CM | POA: Diagnosis not present

## 2020-06-21 DIAGNOSIS — K219 Gastro-esophageal reflux disease without esophagitis: Secondary | ICD-10-CM | POA: Insufficient documentation

## 2020-06-21 LAB — COMPREHENSIVE METABOLIC PANEL
ALT: 21 U/L (ref 0–44)
AST: 22 U/L (ref 15–41)
Albumin: 4.4 g/dL (ref 3.5–5.0)
Alkaline Phosphatase: 98 U/L (ref 38–126)
Anion gap: 12 (ref 5–15)
BUN: 18 mg/dL (ref 6–20)
CO2: 26 mmol/L (ref 22–32)
Calcium: 9.7 mg/dL (ref 8.9–10.3)
Chloride: 104 mmol/L (ref 98–111)
Creatinine, Ser: 1.09 mg/dL — ABNORMAL HIGH (ref 0.44–1.00)
GFR, Estimated: 60 mL/min — ABNORMAL LOW (ref >60)
Glucose, Bld: 95 mg/dL (ref 70–99)
Potassium: 4.4 mmol/L (ref 3.5–5.1)
Sodium: 142 mmol/L (ref 135–145)
Total Bilirubin: 0.7 mg/dL (ref 0.3–1.2)
Total Protein: 8.2 g/dL — ABNORMAL HIGH (ref 6.5–8.1)

## 2020-06-21 LAB — CBC
HCT: 42.5 % (ref 36.0–46.0)
Hemoglobin: 13.8 g/dL (ref 12.0–15.0)
MCH: 30.1 pg (ref 26.0–34.0)
MCHC: 32.5 g/dL (ref 30.0–36.0)
MCV: 92.8 fL (ref 80.0–100.0)
Platelets: 145 10*3/uL — ABNORMAL LOW (ref 150–400)
RBC: 4.58 MIL/uL (ref 3.87–5.11)
RDW: 13.4 % (ref 11.5–15.5)
WBC: 5.7 10*3/uL (ref 4.0–10.5)
nRBC: 0 % (ref 0.0–0.2)

## 2020-06-21 LAB — LIPASE, BLOOD: Lipase: 25 U/L (ref 11–51)

## 2020-06-21 MED ORDER — HYDROCODONE-ACETAMINOPHEN 5-325 MG PO TABS
1.0000 | ORAL_TABLET | ORAL | 0 refills | Status: DC | PRN
Start: 2020-06-21 — End: 2020-08-26

## 2020-06-21 MED ORDER — MORPHINE SULFATE (PF) 4 MG/ML IV SOLN
4.0000 mg | Freq: Once | INTRAVENOUS | Status: AC
  Administered 2020-06-21: 4 mg via INTRAVENOUS
  Filled 2020-06-21: qty 1

## 2020-06-21 MED ORDER — ONDANSETRON 4 MG PO TBDP: 4.0000 mg | ORAL_TABLET | Freq: Three times a day (TID) | ORAL | 0 refills | Status: DC | PRN

## 2020-06-21 MED ORDER — ONDANSETRON HCL 4 MG/2ML IJ SOLN
4.0000 mg | Freq: Once | INTRAMUSCULAR | Status: AC
  Administered 2020-06-21: 4 mg via INTRAVENOUS
  Filled 2020-06-21: qty 2

## 2020-06-21 NOTE — ED Notes (Signed)
An After Visit Summary was printed and given to the patient. Discharge instructions given and no further questions at this time.  

## 2020-06-21 NOTE — ED Notes (Signed)
ED Provider at bedside. 

## 2020-06-21 NOTE — ED Triage Notes (Signed)
Patient c/o RUQ pain that radiates into the mid back and into the right shoulder blade. Patient states for the past 3 weeks. patient reports nausea, heartburn, and RUQ pain after eating.

## 2020-06-21 NOTE — ED Provider Notes (Signed)
[facility] DEPT Provider Note   CSN: [id number] Arrival date & time: 06/21/20  1014     History Chief Complaint  Patient presents with  . Abdominal Pain  . Nausea    [name] [name] is a 54 y.o. female.  54 year old female with history of breast cancer, GERD, hypertension, hepatic steatosis, presents with complaint of right upper quadrant abdominal pain which radiates to her right shoulder blade for the past several months.  Patient states pain is progressively worsening, she is not able to eat or drink anything at this point without causing significant pain.  Denies vomiting, fever, chest pain, shortness of breath, changes in bowel habits.  Patient reports urinary frequency without dysuria.  Patient was waiting to see her GI however thought her appointment was on November 3 with activities on November 11 and she does not feel if she can wait that long.  Patient was seen by her PCP for this pain several months ago, had a CT chest, abdomen, pelvis which did not find a source for her pain at that time.  Patient states her PCP gives her muscle relaxers which were not helping with this.  No other complaints or concerns at this time.  Prior abdominal surgery includes removal of part of her intestine due to diverticulitis.        Past Medical History:  Diagnosis Date  . Allergy    seasonal  . Anal fissure   . Complication of anesthesia   . Diverticulitis   . GERD (gastroesophageal reflux disease) 07/07/2005  . Hemorrhoid   . Hepatic steatosis   . Hypertension   . Internal hemorrhoids   . Malignant neoplasm of lower-inner quadrant of right breast of female, estrogen receptor positive ([place]) 07/16/2018  . Personal history of chemotherapy   . Personal history of radiation therapy   . PONV (postoperative nausea and vomiting)   . Positive H. pylori test     Patient Active Problem List   Diagnosis Date Noted  . Left arm pain 12/05/2019  . Leg  edema 05/31/2019  . Port-A-Cath in place 09/25/2018  . Malignant neoplasm of lower-inner quadrant of right breast of female, estrogen receptor positive ([place]) 07/16/2018  . Special screening for malignant neoplasms, colon 07/10/2017  . Anal fissure 07/07/2014  . Pruritic rash 06/16/2014  . Obesity 06/16/2014  . Primary localized osteoarthrosis, lower leg 11/18/2013  . Bilateral knee pain 09/08/2013  . VITAMIN B12 DEFICIENCY 02/10/2010  . Essential hypertension 08/16/2007  . ALLERGIC RHINITIS 08/16/2007  . GERD 08/16/2007  . Right upper quadrant pain 05/13/2007    Past Surgical History:  Procedure Laterality Date  . BREAST LUMPECTOMY Right    Invasive Ductal   . BREAST LUMPECTOMY WITH RADIOACTIVE SEED AND SENTINEL LYMPH NODE BIOPSY Right 08/23/2018   Procedure: RIGHT BREAST LUMPECTOMY WITH RADIOACTIVE SEED AND RIGHT SENTINEL LYMPH NODE MAPPING;  Surgeon: Erroll Luna, MD;  Location: [facility];  Service: General;  Laterality: Right;  . COLON RESECTION  2000's Dr. Hassell Done   Diverticulitis.  . COLONOSCOPY    . FOOT SURGERY Left   . g3p2 c-section1    . KNEE SURGERY Right   . PORT-A-CATH REMOVAL Right 04/15/2019   Procedure: REMOVAL PORT-A-CATH;  Surgeon: Erroll Luna, MD;  Location: [facility];  Service: General;  Laterality: Right;  MAC AND LOCAL ANESTHETIC  . PORTACATH PLACEMENT Right 09/24/2018   Procedure: INSERTION PORT-A-CATH WITH ULTRASOUND;  Surgeon: Erroll Luna, MD;  Location: [facility];  Service:  General;  Laterality: Right;  . TONSILLECTOMY    . TUBAL LIGATION       OB History   No obstetric history on file.     Family History  Problem Relation Age of Onset  . Hypertension Mother   . Breast cancer Mother 56  . Hypertension Father   . Heart disease Father   . Heart attack Father   . Hypertension Brother   . Diabetes Neg Hx   . COPD Neg Hx   . Colon cancer Neg Hx   . Stomach cancer Neg Hx   . Pancreatic cancer Neg Hx   .  Esophageal cancer Neg Hx     Social History   Tobacco Use  . Smoking status: Never Smoker  . Smokeless tobacco: Never Used  Vaping Use  . Vaping Use: Never used  Substance Use Topics  . Alcohol use: No    Alcohol/week: 0.0 standard drinks  . Drug use: No    Home Medications Prior to Admission medications   Medication Sig Start Date End Date Taking? Authorizing Provider  Cholecalciferol (VITAMIN D3) 5000 UNITS TABS Take 1 tablet by mouth daily.    [provider]  clobetasol ointment (TEMOVATE) 8.36 % Apply 1 application topically 2 (two) times daily. Then as needed for 30 days 07/03/18   [provider]  clotrimazole-betamethasone (LOTRISONE) cream Apply 1 application topically 2 (two) times daily as needed. 11/25/19   Pyrtle, Lajuan Lines, MD  furosemide (LASIX) 40 MG tablet Take 40 mg by mouth daily as needed.    [provider]  HYDROcodone-acetaminophen (NORCO/VICODIN) 5-325 MG tablet Take 1 tablet by mouth every 4 (four) hours as needed. 06/21/20   Tacy Learn, PA-C  letrozole [facility]) 2.5 MG tablet Take 1 tablet (2.5 mg total) by mouth daily. 10/17/19   Nicholas Lose, MD  loratadine (CLARITIN) 10 MG tablet Take 10 mg by mouth daily.    [provider]  losartan (COZAAR) 25 MG tablet Take 25 mg by mouth daily.    [provider]  omeprazole (PRILOSEC) 40 MG capsule TAKE 1 CAPSULE(40 MG) BY MOUTH DAILY 04/01/20   Nicholas Lose, MD  ondansetron (ZOFRAN ODT) 4 MG disintegrating tablet Take 1 tablet (4 mg total) by mouth every 8 (eight) hours as needed for nausea or vomiting. 06/21/20   Tacy Learn, PA-C  spironolactone (ALDACTONE) 50 MG tablet TAKE 1 TABLET(50 MG) BY MOUTH DAILY 07/28/19   Patwardhan, Reynold Bowen, MD  prochlorperazine (COMPAZINE) 10 MG tablet Take 1 tablet (10 mg total) by mouth every 6 (six) hours as needed (Nausea or vomiting). 09/19/18 01/29/19  Nicholas Lose, MD    Allergies    Ibuprofen, Lisinopril, Dilaudid [hydromorphone  hcl], and Tape  Review of Systems   Review of Systems  Constitutional: Negative for chills and fever.  Respiratory: Negative for shortness of breath.   Cardiovascular: Negative for chest pain.  Gastrointestinal: Positive for abdominal pain. Negative for constipation, diarrhea, nausea and vomiting.  Genitourinary: Positive for frequency. Negative for dysuria.  Musculoskeletal: Positive for back pain.  Skin: Negative for rash and wound.  Allergic/Immunologic: Positive for immunocompromised state.  Neurological: Negative for weakness.  Hematological: Negative for adenopathy.  Psychiatric/Behavioral: Negative for confusion.  All other systems reviewed and are negative.   Physical Exam Updated Vital Signs BP (!) 151/96   Pulse 70   Temp 98.1 F (36.7 C) (Oral)   Resp 18   Ht _0  (1.676 m)   Wt 113.4 kg  LMP 09/13/2015 Comment: celebate, BTL  SpO2 99%   BMI 40.35 kg/m   Physical Exam Vitals and nursing note reviewed.  Constitutional:      General: She is not in acute distress.    Appearance: She is well-developed. She is obese. She is not diaphoretic.  HENT:     Head: Normocephalic and atraumatic.  Cardiovascular:     Rate and Rhythm: Normal rate and regular rhythm.     Heart sounds: Normal heart sounds.  Pulmonary:     Effort: Pulmonary effort is normal.     Breath sounds: Normal breath sounds.  Abdominal:     Palpations: Abdomen is soft.     Tenderness: There is abdominal tenderness in the right upper quadrant. There is no right CVA tenderness or left CVA tenderness. Positive signs include Hagen Tidd's sign.     Hernia: No hernia is present.  Skin:    General: Skin is warm and dry.     Findings: No erythema or rash.  Neurological:     Mental Status: She is alert and oriented to person, place, and time.  Psychiatric:        Behavior: Behavior normal.     ED Results / Procedures / Treatments   Labs (all labs ordered are listed, but only abnormal results are  displayed) Labs Reviewed  COMPREHENSIVE METABOLIC PANEL - Abnormal; Notable for the following components:      Result Value   Creatinine, Ser 1.09 (*)    Total Protein 8.2 (*)    GFR, Estimated 60 (*)    All other components within normal limits  CBC - Abnormal; Notable for the following components:   Platelets 145 (*)    All other components within normal limits  LIPASE, BLOOD    EKG None  Radiology US Abdomen Complete  Result Date: 06/21/2020 CLINICAL DATA:  Right upper quadrant pain EXAM: ABDOMEN ULTRASOUND COMPLETE COMPARISON:  None. FINDINGS: Gallbladder: No gallstones or wall thickening visualized. No sonographic Juandiego Kolenovic sign noted by sonographer. Common bile duct: Diameter: 5 mm, normal Liver: No focal lesion identified. Increased parenchymal echogenicity. Portal vein is patent on color Doppler imaging with normal direction of blood flow towards the liver. IVC: No abnormality visualized. Pancreas: Poorly visualized.  Visualized portion unremarkable. Spleen: Size and appearance within normal limits. Right Kidney: Length: 12.2 cm. Echogenicity within normal limits. There is a 3 cm cyst at the lower pole. No hydronephrosis visualized. Left Kidney: Length: 10.4 cm. Echogenicity within normal limits. No mass or hydronephrosis visualized. Abdominal aorta: Only visualized proximally.  No aneurysm. Other findings: Technically limited due to bowel gas. IMPRESSION: Echogenic liver likely reflecting steatosis. Otherwise unremarkable. Electronically Signed   By: Macy Mis M.D.   On: 06/21/2020 14:32    Procedures Procedures (including critical care time)  Medications Ordered in ED Medications  morphine 4 MG/ML injection 4 mg (4 mg Intravenous Given 06/21/20 1230)  ondansetron (ZOFRAN) injection 4 mg (4 mg Intravenous Given 06/21/20 1231)    ED Course  I have reviewed the triage vital signs and the nursing notes.  Pertinent labs & imaging results that were available during my care of  the patient were reviewed by me and considered in my medical decision making (see chart for details).  Clinical Course as of Jun 21 1454  Mon Jun 22, 3671  [id number] 54 year old female with complaint of ongoing right upper quadrant abdominal pain.  On exam found to have right upper quadrant tenderness with positive Temple Ewart sign. Labs reassuring including CBC  and CMP without significant findings.  Lipase within normal notes.  Ultrasound shows hepatic steatosis, no gallbladder wall thickening or CBD dilation. Discussed results with patient, plan is for follow-up with her GI, given prescription for Norco and Zofran.   [LM]    Clinical Course User Index [LM] Roque Lias   MDM Rules/Calculators/A&P                          Final Clinical Impression(s) / ED Diagnoses Final diagnoses:  Right upper quadrant abdominal pain  Hepatic steatosis    Rx / DC Orders ED Discharge Orders         Ordered    HYDROcodone-acetaminophen (NORCO/VICODIN) 5-325 MG tablet  Every 4 hours PRN        06/21/20 1447    ondansetron (ZOFRAN ODT) 4 MG disintegrating tablet  Every 8 hours PRN        06/21/20 1447           Tacy Learn, PA-C 06/21/20 1455    Dorie Rank, MD 06/22/20 [phone]

## 2020-06-21 NOTE — Discharge Instructions (Addendum)
Follow up with your GI, call to ask for earlier appointment/cancellation list.  Take Norco as needed as prescribed for pain. Take Zofran as needed as prescribed for nausea and vomiting.

## 2020-07-01 ENCOUNTER — Ambulatory Visit: Payer: BC Managed Care – PPO | Admitting: Internal Medicine

## 2020-07-01 ENCOUNTER — Encounter: Payer: Self-pay | Admitting: Internal Medicine

## 2020-07-01 VITALS — BP 110/70 | HR 95 | Ht 66.0 in | Wt 250.0 lb

## 2020-07-01 DIAGNOSIS — K219 Gastro-esophageal reflux disease without esophagitis: Secondary | ICD-10-CM | POA: Diagnosis not present

## 2020-07-01 DIAGNOSIS — K5909 Other constipation: Secondary | ICD-10-CM | POA: Diagnosis not present

## 2020-07-01 DIAGNOSIS — R1319 Other dysphagia: Secondary | ICD-10-CM | POA: Diagnosis not present

## 2020-07-01 NOTE — Patient Instructions (Addendum)
You have been scheduled for an endoscopy. Please follow written instructions given to you at your visit today. If you use inhalers (even only as needed), please bring them with you on the day of your procedure.  Use pantoprazole 40 mg every morning.  Use famotidine every evening.  You may use the GI cocktail as needed (do not use this evening or the day of your endoscopy procedure)  Discontinue omeprazole.  Please purchase the following medications over the counter and take as directed: Miralax 17 grams (1 capful) dissolved in at least 8 ounces water/juice.  If you are age 34 or younger, your body mass index should be between 19-25. Your Body mass index is 40.35 kg/m. If this is out of the aformentioned range listed, please consider follow up with your Primary Care Provider.   Due to recent changes in healthcare laws, you may see the results of your imaging and laboratory studies on MyChart before your provider has had a chance to review them.  We understand that in some cases there may be results that are confusing or concerning to you. Not all laboratory results come back in the same time frame and the provider may be waiting for multiple results in order to interpret others.  Please give [name] 48 hours in order for your provider to thoroughly review all the results before contacting the office for clarification of your results.

## 2020-07-01 NOTE — Progress Notes (Signed)
   Subjective:    Patient ID: [name] [name], female    DOB: 1965/10/04, 54 y.o.   MRN: [id number]  HPI [name] [name] is a 54 year old female with a history of GERD, symptomatic internal hemorrhoids, chronic constipation, colonic diverticulosis who is here for follow-up.  She is here alone today and was last seen in June 2021.  She has been having 1 to 2 months of severe heartburn.  This can radiate through to her back between her scapula.  She feels sick on her stomach with nausea no vomiting.  She tried omeprazole without much benefit and then was switched to pantoprazole 40 mg in the morning and famotidine 40 mg in the evening.  She did not feel that this helped much.  She has been using Mylanta.  Mylanta helped for short period but not for very long.  Seen in the ER recently.  Given Magic mouthwash with lidocaine which she reports helps "a whole lot".  Having some solid food dysphagia and mild odynophagia symptom.  Bowel habits have been regular.  Hemorrhoids intermittently bothersome but not a big issue for her now she wants to address her upper GI symptoms.  Last upper endoscopy was in 2007 with Dr. Sharlett Iles.  Distal esophageal biopsies did show esophagitis but no Barrett's esophagus.   Review of Systems As per HPI, otherwise negative  Current Medications, Allergies, Past Medical History, Past Surgical History, Family History and Social History were reviewed in [employer] record.     Objective:   Physical Exam BP 110/70   Pulse 95   Ht 5\' 6"  (1.676 m)   Wt 250 lb (113.4 kg)   LMP 09/13/2015 Comment: celebate, BTL  SpO2 99%   BMI 40.35 kg/m  Gen: awake, alert, NAD HEENT: anicteric CV: RRR, no mrg Pulm: CTA b/l Abd: soft, NT/ND, +BS throughout Neuro: nonfocal     Assessment & Plan:  54 year old female with a history of GERD, symptomatic internal hemorrhoids, chronic constipation, colonic diverticulosis who is here for follow-up  1.  GERD  with dysphagia --likely esophagitis which we discussed today.  I have recommended that she continue and give pantoprazole plus famotidine a bit more time to work.  Also upper endoscopy recommended.  We discussed the risk, benefits and alternatives and she is agreeable and wishes to proceed --Pantoprazole 40 mg 30 minutes before first meal of the day --Famotidine 40 mg in the evening --GERD diet and lifestyle modification avoid eating and drinking within 2 hours of lying down --Continue GI cocktail which she currently has a prescription for and is helping  2.  Constipation --continue MiraLAX 17 g daily as needed  3.  Internal hemorrhoids --not currently bothersome enough to warrant further therapy  30 minutes total spent today including patient facing time, coordination of care, reviewing medical history/procedures/pertinent radiology studies, and documentation of the encounter.

## 2020-07-02 ENCOUNTER — Other Ambulatory Visit: Payer: Self-pay

## 2020-07-02 ENCOUNTER — Ambulatory Visit (AMBULATORY_SURGERY_CENTER): Payer: BC Managed Care – PPO | Admitting: Internal Medicine

## 2020-07-02 ENCOUNTER — Encounter: Payer: Self-pay | Admitting: Internal Medicine

## 2020-07-02 VITALS — BP 167/97 | HR 90 | Temp 97.9°F | Resp 14 | Ht 66.0 in | Wt 250.0 lb

## 2020-07-02 DIAGNOSIS — B9681 Helicobacter pylori [H. pylori] as the cause of diseases classified elsewhere: Secondary | ICD-10-CM

## 2020-07-02 DIAGNOSIS — K2970 Gastritis, unspecified, without bleeding: Secondary | ICD-10-CM

## 2020-07-02 DIAGNOSIS — K219 Gastro-esophageal reflux disease without esophagitis: Secondary | ICD-10-CM

## 2020-07-02 DIAGNOSIS — R1319 Other dysphagia: Secondary | ICD-10-CM | POA: Diagnosis not present

## 2020-07-02 DIAGNOSIS — K2950 Unspecified chronic gastritis without bleeding: Secondary | ICD-10-CM

## 2020-07-02 MED ORDER — SODIUM CHLORIDE 0.9 % IV SOLN: 500.0000 mL | Freq: Once | INTRAVENOUS | Status: DC

## 2020-07-02 NOTE — Patient Instructions (Signed)
YOU HAD AN ENDOSCOPIC PROCEDURE TODAY AT THE [facility]:   Refer to the procedure report that was given to you for any specific questions about what was found during the examination.  If the procedure report does not answer your questions, please call your gastroenterologist to clarify.  If you requested that your care partner not be given the details of your procedure findings, then the procedure report has been included in a sealed envelope for you to review at your convenience later.  YOU SHOULD EXPECT: Some feelings of bloating in the abdomen. Passage of more gas than usual.  Walking can help get rid of the air that was put into your GI tract during the procedure and reduce the bloating. If you had a lower endoscopy (such as a colonoscopy or flexible sigmoidoscopy) you may notice spotting of blood in your stool or on the toilet paper. If you underwent a bowel prep for your procedure, you may not have a normal bowel movement for a few days.  Please Note:  You might notice some irritation and congestion in your nose or some drainage.  This is from the oxygen used during your procedure.  There is no need for concern and it should clear up in a day or so.  SYMPTOMS TO REPORT IMMEDIATELY:    Following upper endoscopy (EGD)  Vomiting of blood or coffee ground material  New chest pain or pain under the shoulder blades  Painful or persistently difficult swallowing  New shortness of breath  Fever of 100F or higher  Black, tarry-looking stools  For urgent or emergent issues, a gastroenterologist can be reached at any hour by calling [phone]. Do not use MyChart messaging for urgent concerns.    DIET:  We do recommend a small meal at first, but then you may proceed to your regular diet.  Drink plenty of fluids but you should avoid alcoholic beverages for 24 hours.  ACTIVITY:  You should plan to take it easy for the rest of today and you should NOT DRIVE or use heavy machinery  until tomorrow (because of the sedation medicines used during the test).    FOLLOW UP: Our staff will call the number listed on your records 48-72 hours following your procedure to check on you and address any questions or concerns that you may have regarding the information given to you following your procedure. If we do not reach you, we will leave a message.  We will attempt to reach you two times.  During this call, we will ask if you have developed any symptoms of COVID 19. If you develop any symptoms (ie: fever, flu-like symptoms, shortness of breath, cough etc.) before then, please call [phone].  If you test positive for Covid 19 in the 2 weeks post procedure, please call and report this information to us.    If any biopsies were taken you will be contacted by phone or by letter within the next 1-3 weeks.  Please call us at [phone] if you have not heard about the biopsies in 3 weeks.    SIGNATURES/CONFIDENTIALITY: You and/or your care partner have signed paperwork which will be entered into your electronic medical record.  These signatures attest to the fact that that the information above on your After Visit Summary has been reviewed and is understood.  Full responsibility of the confidentiality of this discharge information lies with you and/or your care-partner. 

## 2020-07-02 NOTE — Progress Notes (Signed)
Esmolol 10 mg IV as per Dr. Hilarie Fredrickson.

## 2020-07-02 NOTE — Op Note (Signed)
[facility] Patient Name: [name] [name] Procedure Date: 07/02/2020 3:29 PM MRN: [id number] Endoscopist: Jerene Bears , MD Age: 54 Referring MD:  Date of Birth: [birth year] Gender: Female Account #: [id number] Procedure:                Upper GI endoscopy Indications:              Dysphagia, Gastro-esophageal reflux disease, mild                            odynophagia, nausea Medicines:                Monitored Anesthesia Care Procedure:                Pre-Anesthesia Assessment:                           - Prior to the procedure, a History and Physical                            was performed, and patient medications and                            allergies were reviewed. The patient's tolerance of                            previous anesthesia was also reviewed. The risks                            and benefits of the procedure and the sedation                            options and risks were discussed with the patient.                            All questions were answered, and informed consent                            was obtained. Prior Anticoagulants: The patient has                            taken no previous anticoagulant or antiplatelet                            agents. ASA Grade Assessment: III - A patient with                            severe systemic disease. After reviewing the risks                            and benefits, the patient was deemed in                            satisfactory condition to undergo the procedure.  After obtaining informed consent, the endoscope was                            passed under direct vision. Throughout the                            procedure, the patient's blood pressure, pulse, and                            oxygen saturations were monitored continuously. The                            Endoscope was introduced through the mouth, and                            advanced to the second part of  duodenum. The upper                            GI endoscopy was accomplished without difficulty.                            The patient tolerated the procedure well. Scope In: Scope Out: Findings:                 The examined esophagus was normal. Biopsies were                            obtained from the proximal and distal esophagus                            with cold forceps for histology to exclude                            eosinophilic esophagitis. Biopsies performed on                            reinspection after dilation.                           No endoscopic abnormality was evident in the                            esophagus to explain the patient's complaint of                            dysphagia. It was decided, however, to proceed with                            dilation of the entire esophagus. The scope was                            withdrawn. Dilation was performed with a Venia Minks  dilator with mild resistance at 52 Fr. The dilation                            site was examined following endoscope reinsertion                            and showed no change.                           The entire examined stomach was normal. Biopsies                            were taken with a cold forceps for histology and                            Helicobacter pylori testing.                           The examined duodenum was normal. Complications:            No immediate complications. Estimated Blood Loss:     Estimated blood loss was minimal. Impression:               - Normal esophagus. Biopsied. Esophageal dilation                            performed with 52 Fr Maloney.                           - Normal stomach. Biopsied.                           - Normal examined duodenum. Recommendation:           - Patient has a contact number available for                            emergencies. The signs and symptoms of potential                             delayed complications were discussed with the                            patient. Return to normal activities tomorrow.                            Written discharge instructions were provided to the                            patient.                           - Resume previous diet.                           - Continue present medications including  pantoprazole 40 mg once daily, famotidine 20 mg                            each evening. Okay to use GI cocktail (previously                            prescribed) as directed if needed.                           - Await pathology results. Jerene Bears, MD 07/02/2020 3:48:16 PM This report has been signed electronically.

## 2020-07-02 NOTE — Progress Notes (Signed)
pt tolerated well. VSS. awake and to recovery. Report given to RN.  

## 2020-07-02 NOTE — Progress Notes (Signed)
Called to room to assist during endoscopic procedure.  Patient ID and intended procedure confirmed with present staff. Received instructions for my participation in the procedure from the performing physician.  

## 2020-07-06 ENCOUNTER — Emergency Department (HOSPITAL_COMMUNITY)
Admission: EM | Admit: 2020-07-06 | Discharge: 2020-07-07 | Disposition: A | Discharge status: Home / Self Care | Payer: BC Managed Care – PPO | Attending: Emergency Medicine | Admitting: Emergency Medicine

## 2020-07-06 ENCOUNTER — Encounter (HOSPITAL_COMMUNITY): Payer: Self-pay

## 2020-07-06 ENCOUNTER — Telehealth: Payer: Self-pay

## 2020-07-06 DIAGNOSIS — I10 Essential (primary) hypertension: Secondary | ICD-10-CM | POA: Diagnosis not present

## 2020-07-06 DIAGNOSIS — Z79899 Other long term (current) drug therapy: Secondary | ICD-10-CM | POA: Insufficient documentation

## 2020-07-06 DIAGNOSIS — Z9221 Personal history of antineoplastic chemotherapy: Secondary | ICD-10-CM | POA: Diagnosis not present

## 2020-07-06 DIAGNOSIS — R0789 Other chest pain: Secondary | ICD-10-CM | POA: Insufficient documentation

## 2020-07-06 DIAGNOSIS — Z923 Personal history of irradiation: Secondary | ICD-10-CM | POA: Diagnosis not present

## 2020-07-06 DIAGNOSIS — R0600 Dyspnea, unspecified: Secondary | ICD-10-CM | POA: Diagnosis not present

## 2020-07-06 DIAGNOSIS — D696 Thrombocytopenia, unspecified: Secondary | ICD-10-CM | POA: Insufficient documentation

## 2020-07-06 DIAGNOSIS — Z853 Personal history of malignant neoplasm of breast: Secondary | ICD-10-CM | POA: Insufficient documentation

## 2020-07-06 NOTE — ED Triage Notes (Signed)
Pt had an endoscopy on Friday with a biopsy

## 2020-07-06 NOTE — ED Triage Notes (Signed)
Pt complains of epigastric pain that radiates to her back, she has been seen and treated for the same, she states that the reflux meds help a little but the pain is still there

## 2020-07-06 NOTE — Telephone Encounter (Signed)
  Follow up Call-  Call back number 07/02/2020  Post procedure Call Back phone  # [phone]  Permission to leave phone message Yes  Some recent data might be hidden     Patient questions:  Do you have a fever, pain , or abdominal swelling? No. Pain Score  0 *  Have you tolerated food without any problems? Yes.  still having difficulty but no different than before procedure  Have you been able to return to your normal activities? No.  Do you have any questions about your discharge instructions: Diet   No. Medications  No. Follow up visit  No.  Do you have questions or concerns about your Care? No.  Actions: * If pain score is 4 or above: No action needed, pain <4.   1. Have you developed a fever since your procedure? no  2.   Have you had an respiratory symptoms (SOB or cough) since your procedure? no  3.   Have you tested positive for COVID 19 since your procedure no  4.   Have you had any family members/close contacts diagnosed with the COVID 19 since your procedure?  no   If yes to any of these questions please route to Joylene John, RN and Joella Prince, RN

## 2020-07-06 NOTE — ED Provider Notes (Signed)
[facility] DEPT Provider Note   CSN: [id number] Arrival date & time: 07/06/20  2311   History Chief Complaint  Patient presents with  . Abdominal Pain    [name] [name] is a 54 y.o. female.  The history is provided by the patient.  Abdominal Pain She has history of hypertension, breast cancer and has been having pain in the right lower chest intermittently over the last month. Pain usually comes on at night and will last about 15-30 minutes before resolving. It is sharp and burning and radiates through to the back. There is associated dyspnea and nausea and diaphoresis. When present, pain is rated at 10/10. She has been evaluated with normal ultrasound and normal endoscopy. She had similar pain about 10 months ago but it resolved only to recur. Nothing seems to make the pain better, nothing makes it worse. She has been on a course of proton pump inhibitors and H2 blockers without any benefit.  Past Medical History:  Diagnosis Date  . Allergy    seasonal  . Anal fissure   . Complication of anesthesia   . Diverticulitis   . GERD (gastroesophageal reflux disease) 07/07/2005  . Hemorrhoid   . Hepatic steatosis   . Hypertension   . Internal hemorrhoids   . Malignant neoplasm of lower-inner quadrant of right breast of female, estrogen receptor positive ([place]) 07/16/2018  . Personal history of chemotherapy   . Personal history of radiation therapy   . PONV (postoperative nausea and vomiting)   . Positive H. pylori test     Patient Active Problem List   Diagnosis Date Noted  . Left arm pain 12/05/2019  . Leg edema 05/31/2019  . Port-A-Cath in place 09/25/2018  . Malignant neoplasm of lower-inner quadrant of right breast of female, estrogen receptor positive ([place]) 07/16/2018  . Special screening for malignant neoplasms, colon 07/10/2017  . Anal fissure 07/07/2014  . Pruritic rash 06/16/2014  . Obesity 06/16/2014  . Primary localized  osteoarthrosis, lower leg 11/18/2013  . Bilateral knee pain 09/08/2013  . VITAMIN B12 DEFICIENCY 02/10/2010  . Essential hypertension 08/16/2007  . ALLERGIC RHINITIS 08/16/2007  . GERD 08/16/2007  . Right upper quadrant pain 05/13/2007    Past Surgical History:  Procedure Laterality Date  . BREAST LUMPECTOMY Right    Invasive Ductal   . BREAST LUMPECTOMY WITH RADIOACTIVE SEED AND SENTINEL LYMPH NODE BIOPSY Right 08/23/2018   Procedure: RIGHT BREAST LUMPECTOMY WITH RADIOACTIVE SEED AND RIGHT SENTINEL LYMPH NODE MAPPING;  Surgeon: Erroll Luna, MD;  Location: [facility];  Service: General;  Laterality: Right;  . COLON RESECTION  2000's Dr. Hassell Done   Diverticulitis.  . COLONOSCOPY    . FOOT SURGERY Left   . g3p2 c-section1    . KNEE SURGERY Right   . PORT-A-CATH REMOVAL Right 04/15/2019   Procedure: REMOVAL PORT-A-CATH;  Surgeon: Erroll Luna, MD;  Location: [facility];  Service: General;  Laterality: Right;  MAC AND LOCAL ANESTHETIC  . PORTACATH PLACEMENT Right 09/24/2018   Procedure: INSERTION PORT-A-CATH WITH ULTRASOUND;  Surgeon: Erroll Luna, MD;  Location: [facility];  Service: General;  Laterality: Right;  . TONSILLECTOMY    . TUBAL LIGATION       OB History   No obstetric history on file.     Family History  Problem Relation Age of Onset  . Hypertension Mother   . Breast cancer Mother 42  . Hypertension Father   . Heart disease Father   .  Heart attack Father   . Hypertension Brother   . Diabetes Neg Hx   . COPD Neg Hx   . Colon cancer Neg Hx   . Stomach cancer Neg Hx   . Pancreatic cancer Neg Hx   . Esophageal cancer Neg Hx   . Rectal cancer Neg Hx     Social History   Tobacco Use  . Smoking status: Never Smoker  . Smokeless tobacco: Never Used  Vaping Use  . Vaping Use: Never used  Substance Use Topics  . Alcohol use: No    Alcohol/week: 0.0 standard drinks  . Drug use: No    Home Medications Prior to Admission  medications   Medication Sig Start Date End Date Taking? Authorizing Provider  Cholecalciferol (VITAMIN D3) 5000 UNITS TABS Take 1 tablet by mouth daily.    [provider]  clobetasol ointment (TEMOVATE) 0.03 % Apply 1 application topically 2 (two) times daily. Then as needed for 30 days 07/03/18   [provider]  clotrimazole-betamethasone (LOTRISONE) cream Apply 1 application topically 2 (two) times daily as needed. 11/25/19   Pyrtle, Lajuan Lines, MD  HYDROcodone-acetaminophen (NORCO/VICODIN) 5-325 MG tablet Take 1 tablet by mouth every 4 (four) hours as needed. 06/21/20   Tacy Learn, PA-C  letrozole [facility]) 2.5 MG tablet Take 1 tablet (2.5 mg total) by mouth daily. 10/17/19   Nicholas Lose, MD  loratadine (CLARITIN) 10 MG tablet Take 10 mg by mouth daily.    [provider]  losartan (COZAAR) 25 MG tablet Take 25 mg by mouth daily.    [provider]  magic mouthwash w/lidocaine SOLN Take 10 mLs by mouth every 8 (eight) hours as needed for mouth pain.    [provider]  ondansetron (ZOFRAN ODT) 4 MG disintegrating tablet Take 1 tablet (4 mg total) by mouth every 8 (eight) hours as needed for nausea or vomiting. 06/21/20   Tacy Learn, PA-C  spironolactone (ALDACTONE) 50 MG tablet TAKE 1 TABLET(50 MG) BY MOUTH DAILY 07/28/19   Patwardhan, Reynold Bowen, MD    Allergies    Ibuprofen, Lisinopril, Dilaudid [hydromorphone hcl], and Tape  Review of Systems   Review of Systems  Gastrointestinal: Positive for abdominal pain.  All other systems reviewed and are negative.   Physical Exam Updated Vital Signs BP (!) 184/90 (BP Location: Left Arm)   Pulse 83   Temp 98.6 F (37 C) (Oral)   Resp 20   LMP 09/13/2015 Comment: celebate, BTL  SpO2 100%   Physical Exam Vitals and nursing note reviewed.   54 year old female, resting comfortably and in no acute distress. Vital signs are significant for elevated blood pressure. Oxygen saturation is 100%, which  is normal. Head is normocephalic and atraumatic. PERRLA, EOMI. Oropharynx is clear. Neck is nontender and supple without adenopathy or JVD. Back is nontender and there is no CVA tenderness. Lungs are clear without rales, wheezes, or rhonchi. Chest is nontender. Heart has regular rate and rhythm without murmur. Abdomen is soft, flat, nontender without masses or hepatosplenomegaly and peristalsis is normoactive. Extremities have no cyanosis or edema, full range of motion is present. Skin is warm and dry without rash. Neurologic: Mental status is normal, cranial nerves are intact, there are no motor or sensory deficits.  ED Results / Procedures / Treatments   Labs (all labs ordered are listed, but only abnormal results are displayed) Labs Reviewed  COMPREHENSIVE METABOLIC PANEL - Abnormal; Notable for the following components:  Result Value   Glucose, Bld 103 (*)    All other components within normal limits  CBC WITH DIFFERENTIAL/PLATELET - Abnormal; Notable for the following components:   Platelets 134 (*)    All other components within normal limits  LIPASE, BLOOD  TROPONIN I (HIGH SENSITIVITY)  TROPONIN I (HIGH SENSITIVITY)    EKG EKG Interpretation  Date/Time:  Wednesday July 07 2020 00:03:56 EST Ventricular Rate:  87 PR Interval:    QRS Duration: 96 QT Interval:  390 QTC Calculation: 470 R Axis:   14 Text Interpretation: Sinus rhythm Low voltage, precordial leads When compared with ECG of 08/16/2018, No significant change was found Confirmed by Delora Fuel ([id number]) on 07/07/2020 12:08:52 AM   Radiology CT Angio Chest PE W and/or Wo Contrast  Result Date: 07/07/2020 CLINICAL DATA:  Epigastric pain radiating to back, history of GERD with recent endoscopy and biopsy 07/02/2020., breast cancer post lumpectomy and lymph node biopsy, radiation and chemotherapy EXAM: CT ANGIOGRAPHY CHEST WITH CONTRAST TECHNIQUE: Multidetector CT imaging of the chest was performed using  the standard protocol during bolus administration of intravenous contrast. Multiplanar CT image reconstructions and MIPs were obtained to evaluate the vascular anatomy. CONTRAST:  166m OMNIPAQUE IOHEXOL 350 MG/ML SOLN COMPARISON:  Chest CT 09/25/2019 FINDINGS: Cardiovascular: Satisfactory opacification the pulmonary arteries to the segmental level. No pulmonary artery filling defects are identified. Central pulmonary arteries are normal caliber. The aortic root is suboptimally assessed given cardiac pulsation artifact. Atherosclerotic plaque within the normal caliber aorta. No acute luminal abnormality of the imaged aorta. No periaortic stranding or hemorrhage. Normal 3 vessel branching of the aortic arch. Proximal great vessels mildly calcified but otherwise unremarkable. No major venous abnormalities. Normal heart size. No pericardial effusion. Mediastinum/Nodes: No mediastinal fluid or gas. Normal thyroid gland and thoracic inlet. No acute abnormality of the trachea or esophagus. No worrisome mediastinal, hilar or axillary adenopathy. Remote postsurgical changes from prior right axillary dissection, better seen on wider field of view comparison imaging. Lungs/Pleura: No consolidation, features of edema, pneumothorax, or effusion. No suspicious pulmonary nodules or masses. Upper Abdomen: No acute abnormalities present in the visualized portions of the upper abdomen. Diffuse hepatic hypoattenuation compatible with hepatic steatosis. Sparing along gallbladder fossa. Musculoskeletal: Postsurgical changes from prior right breast lumpectomy with surgical clips surrounding some focal soft tissue thickening in the lower inner quadrant of the right breast which is unchanged from prior. No new or concerning chest wall abnormalities. No acute osseous abnormality or suspicious osseous lesion. Multilevel degenerative changes are present in the imaged portions of the spine. Review of the MIP images confirms the above  findings. IMPRESSION: 1. No visible pulmonary artery embolism. 2. No acute findings in the chest. 3. Hepatic steatosis. 4. Postsurgical changes from prior right breast lumpectomy and right axillary dissection. 5. Aortic Atherosclerosis (ICD10-I70.0). Electronically Signed   By: PLovena LeM.D.   On: 07/07/2020 02:14    Procedures Procedures   Medications Ordered in ED Medications - No data to display  ED Course  I have reviewed the triage vital signs and the nursing notes.  Pertinent labs & imaging results that were available during my care of the patient were reviewed by me and considered in my medical decision making (see chart for details).  MDM Rules/Calculators/A&P Right-sided chest pain of uncertain cause. Old records are reviewed confirming ED visit on 11/1 at which time right upper quadrant ultrasound was normal. Upper endoscopy on 11/12 was also normal. Biopsies were taken with results pending. She also  had esophageal dilatation done which has not helped. On 2/4, she had CT of chest and abdomen which showed no cause for her pain. Pain at that time was similar to what she is having now but not as severe. In light of all of these negative work-ups, cause of pain is uncertain. Consider eosinophilic esophagitis. Also check CT angiogram to rule out pulmonary embolism. Will check ECG and troponin to rule out cardiac etiology.  ECG shows no acute changes. Labs show mild thrombocytopenia which is unchanged from prior. CT angiogram of the chest is unremarkable. Cause of her symptoms is still unclear. Recommended she follow-up with her gastroenterologist for further outpatient evaluation.  Final Clinical Impression(s) / ED Diagnoses Final diagnoses:  Atypical chest pain  Thrombocytopenia (HCC)  Elevated blood pressure reading with diagnosis of hypertension    Rx / DC Orders ED Discharge Orders    None       Delora Fuel, MD 82/95/62 [phone]

## 2020-07-07 ENCOUNTER — Encounter (HOSPITAL_COMMUNITY): Payer: Self-pay

## 2020-07-07 ENCOUNTER — Emergency Department (HOSPITAL_COMMUNITY): Payer: BC Managed Care – PPO

## 2020-07-07 ENCOUNTER — Other Ambulatory Visit: Payer: Self-pay

## 2020-07-07 ENCOUNTER — Ambulatory Visit: Payer: BC Managed Care – PPO | Admitting: Cardiology

## 2020-07-07 DIAGNOSIS — R1084 Generalized abdominal pain: Secondary | ICD-10-CM

## 2020-07-07 DIAGNOSIS — R5382 Chronic fatigue, unspecified: Secondary | ICD-10-CM | POA: Insufficient documentation

## 2020-07-07 LAB — CBC WITH DIFFERENTIAL/PLATELET
Abs Immature Granulocytes: 0.01 10*3/uL (ref 0.00–0.07)
Basophils Absolute: 0 10*3/uL (ref 0.0–0.1)
Basophils Relative: 0 %
Eosinophils Absolute: 0.1 10*3/uL (ref 0.0–0.5)
Eosinophils Relative: 2 %
HCT: 39.5 % (ref 36.0–46.0)
Hemoglobin: 12.8 g/dL (ref 12.0–15.0)
Immature Granulocytes: 0 %
Lymphocytes Relative: 27 %
Lymphs Abs: 1.5 10*3/uL (ref 0.7–4.0)
MCH: 29.6 pg (ref 26.0–34.0)
MCHC: 32.4 g/dL (ref 30.0–36.0)
MCV: 91.4 fL (ref 80.0–100.0)
Monocytes Absolute: 0.4 10*3/uL (ref 0.1–1.0)
Monocytes Relative: 8 %
Neutro Abs: 3.6 10*3/uL (ref 1.7–7.7)
Neutrophils Relative %: 63 %
Platelets: 134 10*3/uL — ABNORMAL LOW (ref 150–400)
RBC: 4.32 MIL/uL (ref 3.87–5.11)
RDW: 13.2 % (ref 11.5–15.5)
WBC: 5.8 10*3/uL (ref 4.0–10.5)
nRBC: 0 % (ref 0.0–0.2)

## 2020-07-07 LAB — COMPREHENSIVE METABOLIC PANEL
ALT: 18 U/L (ref 0–44)
AST: 24 U/L (ref 15–41)
Albumin: 4.4 g/dL (ref 3.5–5.0)
Alkaline Phosphatase: 82 U/L (ref 38–126)
Anion gap: 7 (ref 5–15)
BUN: 12 mg/dL (ref 6–20)
CO2: 27 mmol/L (ref 22–32)
Calcium: 9.3 mg/dL (ref 8.9–10.3)
Chloride: 104 mmol/L (ref 98–111)
Creatinine, Ser: 0.96 mg/dL (ref 0.44–1.00)
GFR, Estimated: >60 mL/min (ref >60)
Glucose, Bld: 103 mg/dL — ABNORMAL HIGH (ref 70–99)
Potassium: 3.9 mmol/L (ref 3.5–5.1)
Sodium: 138 mmol/L (ref 135–145)
Total Bilirubin: 0.4 mg/dL (ref 0.3–1.2)
Total Protein: 7.6 g/dL (ref 6.5–8.1)

## 2020-07-07 LAB — TROPONIN I (HIGH SENSITIVITY)
Troponin I (High Sensitivity): <2 ng/L (ref <18)
Troponin I (High Sensitivity): <2 ng/L (ref <18)

## 2020-07-07 LAB — LIPASE, BLOOD: Lipase: 27 U/L (ref 11–51)

## 2020-07-07 MED ORDER — IOHEXOL 350 MG/ML SOLN
100.0000 mL | Freq: Once | INTRAVENOUS | Status: AC | PRN
  Administered 2020-07-07: 100 mL via INTRAVENOUS

## 2020-07-07 MED ORDER — SODIUM CHLORIDE (PF) 0.9 % IJ SOLN
INTRAMUSCULAR | Status: AC
  Filled 2020-07-07: qty 50

## 2020-07-07 NOTE — Telephone Encounter (Signed)
I saw her ER visit I forwarded you a note already with recs CCK HIDA ? Cards eval if not done Thanks

## 2020-07-07 NOTE — ED Notes (Signed)
Patient discharged and has no concerns at this time.

## 2020-07-07 NOTE — Discharge Instructions (Addendum)
Your evaluation here did not show the cause for your pain. Please follow-up with your gastroenterologist for further outpatient work-up. Return to the emergency department if you are having any problems.

## 2020-07-07 NOTE — Progress Notes (Signed)
Error

## 2020-07-08 ENCOUNTER — Other Ambulatory Visit: Payer: Self-pay

## 2020-07-08 ENCOUNTER — Encounter: Payer: Self-pay | Admitting: Cardiology

## 2020-07-08 ENCOUNTER — Telehealth: Payer: Self-pay

## 2020-07-08 ENCOUNTER — Ambulatory Visit: Payer: BC Managed Care – PPO | Admitting: Cardiology

## 2020-07-08 VITALS — BP 144/92 | HR 93 | Ht 66.0 in | Wt 245.0 lb

## 2020-07-08 DIAGNOSIS — R0789 Other chest pain: Secondary | ICD-10-CM

## 2020-07-08 DIAGNOSIS — B9681 Helicobacter pylori [H. pylori] as the cause of diseases classified elsewhere: Secondary | ICD-10-CM

## 2020-07-08 DIAGNOSIS — Z13220 Encounter for screening for lipoid disorders: Secondary | ICD-10-CM

## 2020-07-08 DIAGNOSIS — I10 Essential (primary) hypertension: Secondary | ICD-10-CM

## 2020-07-08 MED ORDER — PANTOPRAZOLE SODIUM 40 MG PO TBEC: 40.0000 mg | DELAYED_RELEASE_TABLET | Freq: Two times a day (BID) | ORAL | 0 refills | Status: DC

## 2020-07-08 MED ORDER — PYLERA 140-125-125 MG PO CAPS: 3.0000 | ORAL_CAPSULE | Freq: Three times a day (TID) | ORAL | 0 refills | Status: DC

## 2020-07-08 NOTE — Progress Notes (Signed)
Patient referred by Leonides Sake, MD for leg edema  Subjective:   [name] [name], female    DOB: 16-Jan-1966, 54 y.o.   MRN: [id number]   Chief Complaint  Patient presents with  . Fatigue  . Follow-up  . Chest Pain    54 y.o. African American female with hypertension, migraines, h/o invasive ductal carcinoma s/p Rt lumpectomy, chemotherapy and radiation, chest pain  Patient has been having retrosternal chest pain, primarily at night, after eating meals. She does not have chest pain during physical activity, walking. She has been seeing gastroenterology and being treated for esophagitis. HIDA scan is being contemplated for possible gall bladder etiology.   Current Outpatient Medications on File Prior to Visit  Medication Sig Dispense Refill  . Cholecalciferol (VITAMIN D3) 5000 UNITS TABS Take 1 tablet by mouth daily.    . clobetasol ointment (TEMOVATE) 9.45 % Apply 1 application topically 2 (two) times daily. Then as needed for 30 days  1  . clotrimazole-betamethasone (LOTRISONE) cream Apply 1 application topically 2 (two) times daily as needed. (Patient taking differently: Apply 1 application topically 2 (two) times daily as needed (irritated skin). ) 30 g 0  . famotidine (PEPCID) 40 MG tablet Take 40 mg by mouth daily.    Marland Kitchen letrozole (FEMARA) 2.5 MG tablet Take 1 tablet (2.5 mg total) by mouth daily. 90 tablet 3  . loratadine (CLARITIN) 10 MG tablet Take 10 mg by mouth daily.    Marland Kitchen losartan (COZAAR) 50 MG tablet Take 50 mg by mouth daily.    . magic mouthwash w/lidocaine SOLN Take 10 mLs by mouth every 8 (eight) hours as needed for mouth pain.    Marland Kitchen ondansetron (ZOFRAN ODT) 4 MG disintegrating tablet Take 1 tablet (4 mg total) by mouth every 8 (eight) hours as needed for nausea or vomiting. 12 tablet 0  . pantoprazole (PROTONIX) 40 MG tablet Take 40 mg by mouth daily.    Marland Kitchen spironolactone (ALDACTONE) 50 MG tablet TAKE 1 TABLET(50 MG) BY MOUTH DAILY (Patient taking  differently: Take 50 mg by mouth daily. ) 90 tablet 0  . HYDROcodone-acetaminophen (NORCO/VICODIN) 5-325 MG tablet Take 1 tablet by mouth every 4 (four) hours as needed. (Patient not taking: Reported on 07/08/2020) 10 tablet 0   No current facility-administered medications on file prior to visit.    Cardiovascular studies:  CTA chest 07/06/2020: 1. No visible pulmonary artery embolism. 2. No acute findings in the chest. 3. Hepatic steatosis. 4. Postsurgical changes from prior right breast lumpectomy and right axillary dissection. 5. Aortic Atherosclerosis (ICD10-I70.0).   EKG 12/05/2019: Sinus rhythm 92 bpm. First degree AV block.  Poor R-wave progression.  EKG 05/30/2019: Sinus rhythm 89 bpm.  Poor R-wave progression. Nonspecific ST-T changes.  Echocardiogram 08/2018: - Left ventricle: The cavity size was normal. Wall thickness was   normal. Systolic function was normal. The estimated ejection   fraction was in the range of 60% to 65%. Wall motion was normal;   there were no regional wall motion abnormalities. Left   ventricular diastolic function parameters were normal. - Left atrium: The atrium was mildly dilated. - Atrial septum: No defect or patent foramen ovale was identified. - Impressions: Normal GLS -20.  Impressions:  - Normal GLS -20.  Recent labs: 07/06/2020: Glucose 103, BUN/Cr 12/0.96. EGFR >60. Na/K 138/3.9. Rest of the CMP normal H/H 12.8/39.5. MCV 91. Platelets 134 Trop HS <2  08/29/2019: Glucose 98, BUN/Cr 13/0.98. EGFR 71. Na/K 139/4.2. Rest of the CMP  normal H/H 14/41. MCV 86. Platelets 137  2015: Chol 180, TG 97, HDL 40, LDL 120  Review of Systems  Cardiovascular: Positive for chest pain. Negative for dyspnea on exertion, leg swelling, palpitations and syncope.  Musculoskeletal: Positive for back pain.       Left arm pain        Vitals:   07/08/20 1026  BP: (!) 144/92  Pulse: 93  SpO2: 99%     Body mass index is 39.54 kg/m.  Filed Weights   07/08/20 1026  Weight: 245 lb (111.1 kg)     Objective:   Physical Exam Vitals and nursing note reviewed.  Neck:     Vascular: No JVD.  Cardiovascular:     Rate and Rhythm: Normal rate and regular rhythm.     Pulses: Intact distal pulses.     Heart sounds: Normal heart sounds. No murmur heard.   Pulmonary:     Effort: Pulmonary effort is normal.     Breath sounds: Normal breath sounds. No wheezing or rales.  Musculoskeletal:     Comments: Left arm tenderness           Assessment & Recommendations:   54 y.o. African American female with hypertension, migraines, h/o invasive ductal carcinoma s/p Rt lumpectomy, chemotherapy and radiation, chest pain  Chest pain: Nonanginal.  Will obtain calcium score and lipid panel.  If calcium score elevated, would consider stress test.  Overall suspicion for cardiac etiology remains low.  Hypertension: Recent elevation in blood pressure could be related to her stress, and pain.  No changes made today.  If continues to have systolic blood pressure greater than 140 mmHg, could consider adding amlodipine in the future.  I will see her on as-needed basis.  Nigel Mormon, MD [facility] Pager: [phone] Office: [phone] If no answer Cell [phone]

## 2020-07-08 NOTE — Telephone Encounter (Signed)
Left message to please call back. °

## 2020-07-08 NOTE — Telephone Encounter (Signed)
-----   Message from Jerene Bears, MD sent at 07/08/2020  1:10 PM EST ----- Please let patient know that she has H. pylori gastritis, this likely explains her recent symptoms. Please increase pantoprazole to 40 mg twice daily during antibiotic therapy, she can stop PPI thereafter Treat with Pylera  H. pylori stool antigen all PPIs recommended in 8-12 weeks  Phone notification in lieu of pathology letter

## 2020-07-08 NOTE — Telephone Encounter (Signed)
From pt

## 2020-07-13 ENCOUNTER — Other Ambulatory Visit: Payer: Self-pay

## 2020-07-13 MED ORDER — FLUCONAZOLE 150 MG PO TABS: ORAL_TABLET | ORAL | 1 refills | Status: DC

## 2020-07-13 NOTE — Telephone Encounter (Signed)
Fluconazole 150 mg PO x 1, refill x 1 -- can repeat several days later if needed

## 2020-07-14 ENCOUNTER — Ambulatory Visit (HOSPITAL_COMMUNITY)
Admission: RE | Admit: 2020-07-14 | Discharge: 2020-07-14 | Disposition: A | Discharge status: Home / Self Care | Payer: BC Managed Care – PPO | Source: Ambulatory Visit | Attending: Internal Medicine | Admitting: Internal Medicine

## 2020-07-14 ENCOUNTER — Other Ambulatory Visit: Payer: Self-pay

## 2020-07-14 ENCOUNTER — Telehealth: Payer: Self-pay | Admitting: Cardiology

## 2020-07-14 DIAGNOSIS — R1084 Generalized abdominal pain: Secondary | ICD-10-CM | POA: Insufficient documentation

## 2020-07-14 DIAGNOSIS — R931 Abnormal findings on diagnostic imaging of heart and coronary circulation: Secondary | ICD-10-CM | POA: Insufficient documentation

## 2020-07-14 DIAGNOSIS — E782 Mixed hyperlipidemia: Secondary | ICD-10-CM | POA: Insufficient documentation

## 2020-07-14 LAB — LIPID PANEL
Chol/HDL Ratio: 4.2 ratio (ref 0.0–4.4)
Cholesterol, Total: 190 mg/dL (ref 100–199)
HDL: 45 mg/dL (ref >39)
LDL Chol Calc (NIH): 128 mg/dL — ABNORMAL HIGH (ref 0–99)
Triglycerides: 91 mg/dL (ref 0–149)
VLDL Cholesterol Cal: 17 mg/dL (ref 5–40)

## 2020-07-14 MED ORDER — ROSUVASTATIN CALCIUM 10 MG PO TABS: 10.0000 mg | ORAL_TABLET | Freq: Every day | ORAL | 5 refills | Status: DC

## 2020-07-14 MED ORDER — TECHNETIUM TC 99M MEBROFENIN IV KIT: 5.3000 | PACK | Freq: Once | INTRAVENOUS | Status: DC | PRN

## 2020-07-14 NOTE — Telephone Encounter (Signed)
CT Cardiac scoring 07/08/2020: Total score: 4 LM: 2.  LAD: 2.  Cx: 0.  RCA: 0  PDA: 0    Lipid panel 07/13/2020: Chol 190, TG 91, HDL 45, LDL 128  Discussed with the patient.  Prescribed rosuvastatin 10 mg. Repeat lipid panel in 3 months  Nigel Mormon, MD Pager: [phone] Office: [phone]

## 2020-07-16 ENCOUNTER — Telehealth: Payer: Self-pay

## 2020-07-16 NOTE — Telephone Encounter (Signed)
Pt called, LVM stating she is "extremely depressed taking Letrozole" and also states, "I just can't do it any more. It is effecting me too bad." This LPN called pt and pt verified she is safe and has no suicidal ideations but explains shed has been depressed and now it is "out of control" and "causing back pain as well." Pt was advised to stop Letrozole and schedule an appt with Dr Lindi Adie for 12/1, his earliest available. Pt verbalized understanding and agreement. This LPN sent schedule message to arrange an appt for 12/1.

## 2020-07-20 NOTE — Assessment & Plan Note (Signed)
08/23/2018:Right lumpectomy: IDC, 0.7 cm, grade 3, margins negative, 0/3 lymph nodes negative, ER 40%, PR 0%, HER-2 negative, Ki-67 50%, T1BN0 stage Ia  Oncotype Dx 44: High Risk  Treatment plan: 1.Adj Chemo with Dose dense adriamycin and Cytoxan foll by Taxol weekly X10stopped early for toxicities 2. Adjuvant radiation therapy7/09/2018-03/28/2019 3. Adjuvant antiestrogen therapy ------------------------------------------------------------------------------------------------------------ Port will be removed 04/15/2019 Treatment plan: Adjuvant antiestrogen therapy with anastrozole 1 mg daily  Anastrozole toxicities: 1.  Hot flashes: Patient is able to tolerate them. 2.  Muscle aches and pains much better on anastrozole than letrozole that she was on previously 3. Depression symptoms: Stopped Anastrozole..  Breast cancer surveillance: 1.  Breast exam 10/17/2019: Benign 2.  Mammograms January 2021: Benign 3.  CT chest abdomen pelvis: No evidence of metastatic disease.  Fatty liver on the CT scans: I discussed with her about reducing weight as her weight is to decrease breast cancer risk as well as to improve the fatty liver.  Chemo-induced peripheral neuropathy:Stable Mild thrombocytopenia: I discussed with her that this is an effect of prior chemotherapy.

## 2020-07-21 ENCOUNTER — Inpatient Hospital Stay: Payer: BC Managed Care – PPO | Attending: Hematology and Oncology | Admitting: Hematology and Oncology

## 2020-07-21 ENCOUNTER — Other Ambulatory Visit: Payer: Self-pay

## 2020-07-21 DIAGNOSIS — C50311 Malignant neoplasm of lower-inner quadrant of right female breast: Secondary | ICD-10-CM

## 2020-07-21 DIAGNOSIS — Z170 Estrogen receptor positive status [ER+]: Secondary | ICD-10-CM | POA: Diagnosis not present

## 2020-07-21 DIAGNOSIS — Z923 Personal history of irradiation: Secondary | ICD-10-CM | POA: Diagnosis not present

## 2020-07-21 DIAGNOSIS — Z79811 Long term (current) use of aromatase inhibitors: Secondary | ICD-10-CM | POA: Diagnosis not present

## 2020-07-21 DIAGNOSIS — Z9221 Personal history of antineoplastic chemotherapy: Secondary | ICD-10-CM | POA: Diagnosis not present

## 2020-07-21 MED ORDER — EXEMESTANE 25 MG PO TABS: 25.0000 mg | ORAL_TABLET | Freq: Every day | ORAL | 0 refills | Status: DC

## 2020-07-21 NOTE — Progress Notes (Signed)
Patient Care Team: [name], [name], MD as PCP - General (Family Medicine) Erroll Luna, MD as Consulting Physician (General Surgery) Nicholas Lose, MD as Consulting Physician (Hematology and Oncology) Kyung Rudd, MD as Consulting Physician (Radiation Oncology)  DIAGNOSIS:  Encounter Diagnosis  Name Primary?  . Malignant neoplasm of lower-inner quadrant of right breast of female, estrogen receptor positive ([place])     SUMMARY OF ONCOLOGIC HISTORY: Oncology History  Malignant neoplasm of lower-inner quadrant of right breast of female, estrogen receptor positive ([place])  07/16/2018 Initial Diagnosis   Pain right breast UOQ: Negative on mammogram, incidentally found right breast mass LIQ at 3:30 position measuring 0.6 cm biopsy revealed grade 3 IDC ER 40%, PR 0%, HER-2 negative, Ki-67 50%, T1bN0 stage Ib   07/24/2018 Cancer Staging   Staging form: Breast, AJCC 8th Edition - Clinical: Stage IB (cT1b, cN0, cM0, G3, ER+, PR-, HER2-) - Signed by Nicholas Lose, MD on 07/24/2018   08/23/2018 Surgery   Right lumpectomy: IDC, 0.7 cm, grade 3, margins negative, 0/3 lymph nodes negative, ER 40%, PR 0%, HER-2 negative, Ki-67 50%, T1BN0 stage Ia   08/23/2018 Oncotype testing   45/33%   09/04/2018 Cancer Staging   Staging form: Breast, AJCC 8th Edition - Pathologic: Stage IA (pT1b, pN0, cM0, G3, ER+, PR-, HER2-) - Signed by Gardenia Phlegm, NP on 09/04/2018   09/25/2018 - 01/22/2019 Chemotherapy   Adjuvant chemotherapy: completed 4 cycles dose dense Adriamycin and Cytoxan, completed 10 cycles of Taxol, stopped early due to neuropathy    02/19/2019 - 04/07/2019 Radiation Therapy   Adjuvant XRT The patient initially received a dose of 50.4 Gy in 28 fractions to the breast using whole-breast tangent fields. This was delivered using a 3-D conformal technique. The patient then received a boost to the seroma. This delivered an additional 10 Gy in 5 fractions using a 3-field photon boost technique. The  total dose was 60.4 Gy.   04/2019 -  Anti-estrogen oral therapy   Anastrozole switched to letrozole switched to Exemestane     CHIEF COMPLIANT: Inability to tolerate letrozole  INTERVAL HISTORY: [name] [name] is a 12-year with above-mentioned history of breast cancer diagnosed in 2019.  She has completed adjuvant chemo and is currently on antiestrogen therapy.  Initially she was on anastrozole and was switched to letrozole because of muscle aches and pains as well as hot flashes and depression.  Recently she has been experiencing intense back spasms and had been to emergency room a few times as well as received pain medications and muscle relaxants.  In spite of all that the pain continued to persist.  We instructed her to stop letrozole few weeks ago and she is here to discuss results.  She tells me that the pain is markedly better.   ALLERGIES:  is allergic to ibuprofen, lisinopril, dilaudid [hydromorphone hcl], and tape.  MEDICATIONS:  Current Outpatient Medications  Medication Sig Dispense Refill  . bismuth-metronidazole-tetracycline (PYLERA) 140-125-125 MG capsule Take 3 capsules by mouth 4 (four) times daily -  before meals and at bedtime. 120 capsule 0  . Cholecalciferol (VITAMIN D3) 5000 UNITS TABS Take 1 tablet by mouth daily.    . clobetasol ointment (TEMOVATE) 8.25 % Apply 1 application topically 2 (two) times daily. Then as needed for 30 days  1  . clotrimazole-betamethasone (LOTRISONE) cream Apply 1 application topically 2 (two) times daily as needed. (Patient taking differently: Apply 1 application topically 2 (two) times daily as needed (irritated skin). ) 30 g 0  .  exemestane (AROMASIN) 25 MG tablet Take 1 tablet (25 mg total) by mouth daily after breakfast. 30 tablet 0  . famotidine (PEPCID) 40 MG tablet Take 40 mg by mouth daily.    . fluconazole (DIFLUCAN) 150 MG tablet Take 1 tablet by mouth. May repeat in a few days if still having symptoms. 1 tablet 1  .  HYDROcodone-acetaminophen (NORCO/VICODIN) 5-325 MG tablet Take 1 tablet by mouth every 4 (four) hours as needed. (Patient not taking: Reported on 07/08/2020) 10 tablet 0  . loratadine (CLARITIN) 10 MG tablet Take 10 mg by mouth daily.    Marland Kitchen losartan (COZAAR) 50 MG tablet Take 50 mg by mouth daily.    . magic mouthwash w/lidocaine SOLN Take 10 mLs by mouth every 8 (eight) hours as needed for mouth pain.    . Multiple Vitamins-Minerals (MULTIVITAMIN WITH MINERALS) tablet Take 1 tablet by mouth daily.    . ondansetron (ZOFRAN ODT) 4 MG disintegrating tablet Take 1 tablet (4 mg total) by mouth every 8 (eight) hours as needed for nausea or vomiting. 12 tablet 0  . pantoprazole (PROTONIX) 40 MG tablet Take 1 tablet (40 mg total) by mouth 2 (two) times daily. 20 tablet 0  . Probiotic Product (PRO-BIOTIC BLEND PO) Take 1 capsule by mouth daily.    . rosuvastatin (CRESTOR) 10 MG tablet Take 1 tablet (10 mg total) by mouth daily. 30 tablet 5  . spironolactone (ALDACTONE) 50 MG tablet TAKE 1 TABLET(50 MG) BY MOUTH DAILY (Patient taking differently: Take 50 mg by mouth daily. ) 90 tablet 0   No current facility-administered medications for this visit.    PHYSICAL EXAMINATION: ECOG PERFORMANCE STATUS: 1 - Symptomatic but completely ambulatory  Vitals:   07/21/20 1341  BP: (!) 149/74  Pulse: 97  Resp: 18  Temp: 98 F (36.7 C)  SpO2: 100%   Filed Weights   07/21/20 1341  Weight: 240 lb 3.2 oz (109 kg)      LABORATORY DATA:  I have reviewed the data as listed CMP Latest Ref Rng & Units 07/06/2020 06/21/2020 09/18/2019  Glucose 70 - 99 mg/dL 103(H) 95 98  BUN 6 - 20 mg/dL _0 Creatinine 0.44 - 1.00 mg/dL 0.96 1.09(H) 0.98  Sodium 135 - 145 mmol/L 138 142 139  Potassium 3.5 - 5.1 mmol/L 3.9 4.4 4.2  Chloride 98 - 111 mmol/L 104 104 101  CO2 22 - 32 mmol/L _1 Calcium 8.9 - 10.3 mg/dL 9.3 9.7 10.2  Total Protein 6.5 - 8.1 g/dL 7.6 8.2(H) 7.8  Total Bilirubin 0.3 - 1.2 mg/dL 0.4 0.7  0.5  Alkaline Phos 38 - 126 U/L 82 98 99  AST 15 - 41 U/L _2 ALT 0 - 44 U/L _3 Lab Results  Component Value Date   WBC 5.8 07/06/2020   HGB 12.8 07/06/2020   HCT 39.5 07/06/2020   MCV 91.4 07/06/2020   PLT 134 (L) 07/06/2020   NEUTROABS 3.6 07/06/2020    ASSESSMENT & PLAN:  Malignant neoplasm of lower-inner quadrant of right breast of female, estrogen receptor positive ([place]) 08/23/2018:Right lumpectomy: IDC, 0.7 cm, grade 3, margins negative, 0/3 lymph nodes negative, ER 40%, PR 0%, HER-2 negative, Ki-67 50%, T1BN0 stage Ia  Oncotype Dx 44: High Risk  Treatment plan: 1.Adj Chemo with Dose dense adriamycin and Cytoxan foll by Taxol weekly X10stopped early for toxicities 2. Adjuvant radiation therapy7/09/2018-03/28/2019 3. Adjuvant antiestrogen therapy ------------------------------------------------------------------------------------------------------------ Port will be removed  04/15/2019 Treatment plan: Adjuvant antiestrogen therapy with anastrozole 1 mg daily, switched to letrozole, switching to exemestane to start 08/21/2020  Letrozole toxicities: Severe back pain issues: Improving after she stopped letrozole She had been to emergency room this multiple times for back pain. I would like to switch her from letrozole to exemestane.  I sent a prescription to her pharmacy.  She will start this in August 21, 2020.  Breast cancer surveillance: 1.  Breast exam 10/17/2019: Benign 2.  Mammograms January 2021: Benign 3.  CT chest abdomen pelvis: No evidence of metastatic disease.  I will see her back in February to assess tolerability to exemestane.   No orders of the defined types were placed in this encounter.  The patient has a good understanding of the overall plan. she agrees with it. she will call with any problems that may develop before the next visit here. Total time spent: 30 mins including face to face time and time spent for planning, charting and  co-ordination of care   Harriette Ohara, MD 07/21/20

## 2020-07-22 ENCOUNTER — Other Ambulatory Visit: Payer: Self-pay | Admitting: Obstetrics and Gynecology

## 2020-07-22 ENCOUNTER — Ambulatory Visit
Admission: RE | Admit: 2020-07-22 | Discharge: 2020-07-22 | Disposition: A | Discharge status: Home / Self Care | Payer: BC Managed Care – PPO | Source: Ambulatory Visit | Attending: Obstetrics and Gynecology | Admitting: Obstetrics and Gynecology

## 2020-07-22 DIAGNOSIS — M62830 Muscle spasm of back: Secondary | ICD-10-CM

## 2020-07-23 ENCOUNTER — Telehealth: Payer: Self-pay | Admitting: Hematology and Oncology

## 2020-07-23 NOTE — Telephone Encounter (Signed)
No 12/1 los, no changes made to pt schedule

## 2020-08-12 ENCOUNTER — Other Ambulatory Visit: Payer: Self-pay | Admitting: Adult Health

## 2020-08-12 DIAGNOSIS — Z853 Personal history of malignant neoplasm of breast: Secondary | ICD-10-CM

## 2020-08-14 ENCOUNTER — Other Ambulatory Visit: Payer: Self-pay | Admitting: Hematology and Oncology

## 2020-08-26 ENCOUNTER — Other Ambulatory Visit: Payer: Self-pay

## 2020-08-26 ENCOUNTER — Ambulatory Visit (INDEPENDENT_AMBULATORY_CARE_PROVIDER_SITE_OTHER): Payer: BC Managed Care – PPO | Admitting: Family Medicine

## 2020-08-26 ENCOUNTER — Encounter (INDEPENDENT_AMBULATORY_CARE_PROVIDER_SITE_OTHER): Payer: Self-pay | Admitting: Family Medicine

## 2020-08-26 VITALS — BP 152/100 | HR 83 | Temp 98.2°F | Ht 66.0 in | Wt 237.0 lb

## 2020-08-26 DIAGNOSIS — R5383 Other fatigue: Secondary | ICD-10-CM | POA: Diagnosis not present

## 2020-08-26 DIAGNOSIS — Z1331 Encounter for screening for depression: Secondary | ICD-10-CM

## 2020-08-26 DIAGNOSIS — R0602 Shortness of breath: Secondary | ICD-10-CM | POA: Diagnosis not present

## 2020-08-26 DIAGNOSIS — E7849 Other hyperlipidemia: Secondary | ICD-10-CM

## 2020-08-26 DIAGNOSIS — I10 Essential (primary) hypertension: Secondary | ICD-10-CM

## 2020-08-26 DIAGNOSIS — E559 Vitamin D deficiency, unspecified: Secondary | ICD-10-CM | POA: Insufficient documentation

## 2020-08-26 DIAGNOSIS — Z0289 Encounter for other administrative examinations: Secondary | ICD-10-CM

## 2020-08-26 DIAGNOSIS — C50911 Malignant neoplasm of unspecified site of right female breast: Secondary | ICD-10-CM | POA: Diagnosis not present

## 2020-08-26 DIAGNOSIS — Z9189 Other specified personal risk factors, not elsewhere classified: Secondary | ICD-10-CM | POA: Insufficient documentation

## 2020-08-26 DIAGNOSIS — K219 Gastro-esophageal reflux disease without esophagitis: Secondary | ICD-10-CM

## 2020-08-26 DIAGNOSIS — Z6838 Body mass index (BMI) 38.0-38.9, adult: Secondary | ICD-10-CM

## 2020-08-26 MED ORDER — VITAMIN D3 125 MCG (5000 UT) PO TABS
ORAL_TABLET | ORAL | Status: DC
Start: 2020-08-26 — End: 2021-05-19

## 2020-08-27 LAB — COMPREHENSIVE METABOLIC PANEL WITH GFR
ALT: 22 IU/L (ref 0–32)
AST: 24 IU/L (ref 0–40)
Albumin/Globulin Ratio: 1.6 (ref 1.2–2.2)
Albumin: 4.5 g/dL (ref 3.8–4.9)
Alkaline Phosphatase: 104 IU/L (ref 44–121)
BUN/Creatinine Ratio: 14 (ref 9–23)
BUN: 12 mg/dL (ref 6–24)
Bilirubin Total: 0.5 mg/dL (ref 0.0–1.2)
CO2: 27 mmol/L (ref 20–29)
Calcium: 9.6 mg/dL (ref 8.7–10.2)
Chloride: 102 mmol/L (ref 96–106)
Creatinine, Ser: 0.85 mg/dL (ref 0.57–1.00)
GFR calc Af Amer: 89 mL/min/1.73
GFR calc non Af Amer: 77 mL/min/1.73
Globulin, Total: 2.8 g/dL (ref 1.5–4.5)
Glucose: 76 mg/dL (ref 65–99)
Potassium: 4.2 mmol/L (ref 3.5–5.2)
Sodium: 142 mmol/L (ref 134–144)
Total Protein: 7.3 g/dL (ref 6.0–8.5)

## 2020-08-27 LAB — LIPID PANEL
Chol/HDL Ratio: 2.8 ratio (ref 0.0–4.4)
Cholesterol, Total: 150 mg/dL (ref 100–199)
HDL: 53 mg/dL
LDL Chol Calc (NIH): 84 mg/dL (ref 0–99)
Triglycerides: 64 mg/dL (ref 0–149)
VLDL Cholesterol Cal: 13 mg/dL (ref 5–40)

## 2020-08-27 LAB — CBC WITH DIFFERENTIAL/PLATELET
Basophils Absolute: 0 10*3/uL (ref 0.0–0.2)
Basos: 0 %
EOS (ABSOLUTE): 0.1 10*3/uL (ref 0.0–0.4)
Eos: 1 %
Hematocrit: 40.4 % (ref 34.0–46.6)
Hemoglobin: 13.5 g/dL (ref 11.1–15.9)
Immature Grans (Abs): 0 10*3/uL (ref 0.0–0.1)
Immature Granulocytes: 0 %
Lymphocytes Absolute: 1.4 10*3/uL (ref 0.7–3.1)
Lymphs: 27 %
MCH: 30.6 pg (ref 26.6–33.0)
MCHC: 33.4 g/dL (ref 31.5–35.7)
MCV: 92 fL (ref 79–97)
Monocytes Absolute: 0.3 10*3/uL (ref 0.1–0.9)
Monocytes: 7 %
Neutrophils Absolute: 3.1 10*3/uL (ref 1.4–7.0)
Neutrophils: 65 %
Platelets: 125 10*3/uL — ABNORMAL LOW (ref 150–450)
RBC: 4.41 x10E6/uL (ref 3.77–5.28)
RDW: 13.4 % (ref 11.7–15.4)
WBC: 4.9 10*3/uL (ref 3.4–10.8)

## 2020-08-27 LAB — INSULIN, RANDOM: INSULIN: 20 u[IU]/mL (ref 2.6–24.9)

## 2020-08-27 LAB — T4, FREE: Free T4: 1.36 ng/dL (ref 0.82–1.77)

## 2020-08-27 LAB — T3: T3, Total: 110 ng/dL (ref 71–180)

## 2020-08-27 LAB — FOLATE: Folate: 7.8 ng/mL (ref >3.0)

## 2020-08-27 LAB — VITAMIN B12: Vitamin B-12: 863 pg/mL (ref 232–1245)

## 2020-08-27 LAB — VITAMIN D 25 HYDROXY (VIT D DEFICIENCY, FRACTURES): Vit D, 25-Hydroxy: 46.9 ng/mL (ref 30.0–100.0)

## 2020-08-27 LAB — HEMOGLOBIN A1C
Est. average glucose Bld gHb Est-mCnc: 108 mg/dL
Hgb A1c MFr Bld: 5.4 % (ref 4.8–5.6)

## 2020-08-27 LAB — TSH: TSH: 1.44 u[IU]/mL (ref 0.450–4.500)

## 2020-08-30 NOTE — Progress Notes (Signed)
Chief Complaint:   OBESITY [name] [name] (MR# [id number]) is a 55 y.o. female who presents for evaluation and treatment of obesity and related comorbidities. Current BMI is Body mass index is 38.25 kg/m. [name] [name] has been struggling with her weight for many years and has been unsuccessful in either losing weight, maintaining weight loss, or reaching her healthy weight goal.  [name] [name] is currently in the action stage of change and ready to dedicate time achieving and maintaining a healthier weight. [name] [name] is interested in becoming our patient and working on intensive lifestyle modifications including (but not limited to) diet and exercise for weight loss.  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 She is single and live alone. She has 1 older child who doesn't live with her. She has never been on "a diet." She has popcorn, chips, cookies, and snack before bed, especially when she skips meals- no dinner. She had a recent bout of reflux with H. Pylori infection, which has limited her food intake as of late.  [name] [name]'s habits were reviewed today and are as follows: her desired weight loss is 47 lbs, she has been heavy most of her life, she started gaining weight about 10 years ago, her heaviest weight ever was 257 pounds, she has significant food cravings issues, she snacks frequently in the evenings, she skips meals frequently, she is frequently drinking liquids with calories, she frequently makes poor food choices and she struggles with emotional eating.  This is the patient's first visit at [facility].  The patient's NEW PATIENT PACKET that they filled out prior to today's office visit was reviewed at length and information from that paperwork was included within the following office visit note.    Included in the packet: current and past health history, medications, allergies, ROS, gynecologic history (women only), surgical history, family history,  social history, weight history, weight loss surgery history (for those that have had weight loss surgery), nutritional evaluation, mood and food questionnaire along with a depression screening (PHQ9) on all patients, an Epworth questionnaire, sleep habits questionnaire, patient life and health improvement goals questionnaire. These will all be scanned into the patient's chart under media.   During the visit, I independently reviewed the patient's EKG, bioimpedance scale results, and indirect calorimeter results. I used this information to tailor a meal plan for the patient that will help [name] [name] to lose weight and will improve her obesity-related conditions going forward.  I performed a medically necessary appropriate examination and/or evaluation. I discussed the assessment and treatment plan with the patient. The patient was provided an opportunity to ask questions and all were answered. The patient agreed with the plan and demonstrated an understanding of the instructions. Labs were ordered today (unless patient declined them) and will be reviewed with the patient at our next visit unless more critical results need to be addressed immediately. Clinical information was updated and documented in the EMR.  Time spent on visit including pre-visit chart review and post-visit care was estimated to be 70 minutes.  A separate 15 minutes was spent on risk counseling (see above/below).   Depression Screen [name] [name]'s Food and Mood (modified PHQ-9) score was 9.  Depression screen PHQ 2/9 08/26/2020  Decreased Interest 1  Down, Depressed, Hopeless 1  PHQ - 2 Score 2  Altered sleeping 1  Tired, decreased energy 3  Change in appetite 0  Feeling bad or failure about yourself  1  Trouble concentrating 2  Moving slowly  or fidgety/restless 0  Suicidal thoughts 0  PHQ-9 Score 9  Difficult doing work/chores Not difficult at all  Some recent data might be hidden    Assessment/Plan:   1. SOBOE (shortness of  breath on exertion) [name] [name] notes increasing shortness of breath with exercising and seems to be worsening over time with weight gain. She notes getting out of breath sooner with activity than she used to. This has gotten worse recently. [name] [name] denies shortness of breath at rest or orthopnea.  Plan: Jetty does feel that she gets out of breath more easily that she used to when she exercises. [name] [name]'s shortness of breath appears to be obesity related and exercise induced. She has agreed to work on weight loss and gradually increase exercise to treat her exercise induced shortness of breath. Will continue to monitor closely. Check labs today.  Orders - Vitamin B12 - CBC with Differential/Platelet - Comprehensive metabolic panel - Folate - Hemoglobin A1c - Insulin, random - Lipid panel - T3 - T4, free - TSH - VITAMIN D 25 Hydroxy (Vit-D Deficiency, Fractures)  2. Other fatigue [name] [name] admits to daytime somnolence and admits to waking up still tired. Patent has a history of symptoms of daytime fatigue and morning headache. [name] [name] generally gets 7 or 8 hours of sleep per night, and states that she has good sleep quality. Snoring is present. Apneic episodes are not present. Epworth Sleepiness Score is 9.  [name] [name] does feel that her weight is causing her energy to be lower than it should be. Fatigue may be related to obesity, depression or many other causes. Labs will be ordered, and in the meanwhile, [name] [name] will focus on self care including making healthy food choices, increasing physical activity and focusing on stress reduction. Check labs today.  Oders - Vitamin B12 - CBC with Differential/Platelet - Comprehensive metabolic panel - Folate - Hemoglobin A1c - Insulin, random - Lipid panel - T3 - T4, free - TSH - VITAMIN D 25 Hydroxy (Vit-D Deficiency, Fractures)  3. Essential hypertension [name] [name]'s blood pressure at her PCP's office on Monday was 117/72. Her  blood pressure at home runs 126/72-73. She is taking losartan and spironolactone.    BP Readings from Last 3 Encounters:  08/26/20 (!) 152/100  07/21/20 (!) 149/74  07/08/20 (!) 144/92    Plan: [name] [name]'s blood pressure is not at goal today in the office but she is nervous. Practice home blood pressure monitoring as it is under good control at home. Check labs today. Sarajane is working on healthy weight loss and exercise to improve blood pressure control. We will watch for signs of hypotension as she continues her lifestyle modifications.  Orders - Vitamin B12 - CBC with Differential/Platelet - Comprehensive metabolic panel - Folate - Hemoglobin A1c - Insulin, random - Lipid panel - T3 - T4, free - TSH - VITAMIN D 25 Hydroxy (Vit-D Deficiency, Fractures)  4. Malignant neoplasm of right female breast, unspecified estrogen receptor status, unspecified site of breast ([place]) Leslieann was diagnosed with breast cancer in 2020. She was referred to [name] by oncologist who recommends that she loses weight. She is prescribed exemestane.  Plan: Continue current treatment plan per oncology team. Check labs today.  Orders - Vitamin B12 - CBC with Differential/Platelet - Comprehensive metabolic panel - Folate - Hemoglobin A1c - Insulin, random - Lipid panel - T3 - T4, free - TSH - VITAMIN D 25 Hydroxy (Vit-D Deficiency, Fractures)  5. Other hyperlipidemia Martrice has hyperlipidemia and has been trying to improve her  cholesterol levels with intensive lifestyle modification including a low saturated fat diet, exercise and weight loss. She denies any chest pain, claudication or myalgias. She is prescribed Crestor.  Plan: Cardiovascular risk and specific lipid/LDL goals reviewed.  We discussed several lifestyle modifications today and Sherrell will continue to work on diet, exercise and weight loss efforts. Orders and follow up as documented in patient record. Check labs  today.  Counseling Intensive lifestyle modifications are the first line treatment for this issue. . Dietary changes: Increase soluble fiber. Decrease simple carbohydrates. . Exercise changes: Moderate to vigorous-intensity aerobic activity 150 minutes per week if tolerated. . Lipid-lowering medications: see documented in medical record.  Orders - Vitamin B12 - CBC with Differential/Platelet - Comprehensive metabolic panel - Folate - Hemoglobin A1c - Insulin, random - Lipid panel - T3 - T4, free - TSH - VITAMIN D 25 Hydroxy (Vit-D Deficiency, Fractures)  6. Gastroesophageal reflux disease, unspecified whether esophagitis present She had a recent bout of reflux with H. Pylori infection- treated by another provider.  These recent symptoms have limited her food intake as of late. She has follow up appts for this in the near future. Symptoms overall are improving with her treatment regimen.  Plan: Discussed with patient treatment.  Symptoms are stable and or improving  Reminded patient that lifestyle modifications are the first line treatment for this issue. Some of these include not eating within 2 hours of lying down, avoiding trigger foods, decrease caffeine intake, decrease abdominal girth, no tobacco cessation (if applicable).  Danasia Baker will continue to work on diet, exercise and weight loss efforts.  Orders and follow up as documented in patient record.   Counseling . If a person has gastroesophageal reflux disease (GERD), food and stomach acid move back up into the esophagus and cause symptoms or problems such as damage to the esophagus. . Anti-reflux measures include: raising the head of the bed, avoiding tight clothing or belts, avoiding eating late at night, not lying down shortly after mealtime, and achieving weight loss. . Avoid ASA, NSAID's, caffeine, alcohol, and tobacco.  . OTC Pepcid and/or Tums are often very helpful for as needed use.  Marland Kitchen However, for  persisting chronic or daily symptoms, stronger medications like Omeprazole may be needed. . You may need to avoid foods and drinks such as: ? Coffee and tea (with or without caffeine). ? Drinks that contain alcohol. ? Energy drinks and sports drinks. ? Bubbly (carbonated) drinks or sodas. ? Chocolate and cocoa. ? Peppermint and mint flavorings. ? Garlic and onions. ? Horseradish. ? Spicy and acidic foods. These include peppers, chili powder, curry powder, vinegar, hot sauces, and BBQ sauce. ? Citrus fruit juices and citrus fruits, such as oranges, lemons, and limes. ? Tomato-based foods. These include red sauce, chili, salsa, and pizza with red sauce. ? Fried and fatty foods. These include donuts, french fries, potato chips, and high-fat dressings. ? High-fat meats. These include hot dogs, rib eye steak, sausage, ham, and bacon.  Orders - Vitamin B12 - CBC with Differential/Platelet - Comprehensive metabolic panel - Folate - Hemoglobin A1c - Insulin, random - Lipid panel - T3 - T4, free - TSH - VITAMIN D 25 Hydroxy (Vit-D Deficiency, Fractures)  7. Vitamin D deficiency Zariya is currently taking OTC vitamin D 1000 units each day. She denies nausea, vomiting or muscle weakness.  Plan: Check labs today. Low Vitamin D level contributes to fatigue and are associated with obesity, breast, and colon cancer. She agrees to continue  to take prescription Vitamin D @50 ,000 IU every week and will follow-up for routine testing of Vitamin D, at least 2-3 times per year to avoid over-replacement.  Orders  - Vitamin B12 - CBC with Differential/Platelet - Comprehensive metabolic panel - Folate - Hemoglobin A1c - Insulin, random - Lipid panel - T3 - T4, free - TSH - VITAMIN D 25 Hydroxy (Vit-D Deficiency, Fractures)  8. Depression screening Brodie had a positive depression screening. Depression is commonly associated with obesity and often results in emotional eating behaviors. We  will monitor this closely and work on CBT to help improve the non-hunger eating patterns. Referral to Psychology may be required if no improvement is seen as she continues in our clinic.  9. At risk for impaired metabolic function Lainy was given approximately 30 minutes of impaired  metabolic function prevention counseling today. We discussed intensive lifestyle modifications today with an emphasis on specific nutrition and exercise instructions and strategies.   10. Class 2 severe obesity with serious comorbidity and body mass index (BMI) of 38.0 to 38.9 in adult, unspecified obesity type [facility]) Amilyah is currently in the action stage of change and her goal is to continue with weight loss efforts. I recommend Felicha begin the structured treatment plan as follows:  She has agreed to the Category 2 Plan.  Exercise goals: As is   Behavioral modification strategies: no skipping meals (currently skips dinner nightly), meal planning and cooking strategies and planning for success.  She was informed of the importance of frequent follow-up visits to maximize her success with intensive lifestyle modifications for her multiple health conditions. She was informed we would discuss her lab results at her next visit unless there is a critical issue that needs to be addressed sooner. Dariana agreed to keep her next visit at the agreed upon time to discuss these results.  Objective:   Blood pressure (!) 152/100, pulse 83, temperature 98.2 F (36.8 C), height 5\' 6"  (1.676 m), weight 237 lb (107.5 kg), last menstrual period 09/13/2015, SpO2 99 %. Body mass index is 38.25 kg/m.  EKG: Normal sinus rhythm, rate 87.  Indirect Calorimeter completed today shows a VO2 of 281 and a REE of 1954.  Her calculated basal metabolic rate is [id number] thus her basal metabolic rate is better than expected.  General: Cooperative, alert, well developed, in no acute distress. HEENT: Conjunctivae and lids  unremarkable. Cardiovascular: Regular rhythm.  Lungs: Normal work of breathing. Neurologic: No focal deficits.   Lab Results  Component Value Date   CREATININE 0.85 08/26/2020   BUN 12 08/26/2020   NA 142 08/26/2020   K 4.2 08/26/2020   CL 102 08/26/2020   CO2 27 08/26/2020   Lab Results  Component Value Date   ALT 22 08/26/2020   AST 24 08/26/2020   ALKPHOS 104 08/26/2020   BILITOT 0.5 08/26/2020   Lab Results  Component Value Date   HGBA1C 5.4 08/26/2020   Lab Results  Component Value Date   INSULIN 20.0 08/26/2020   Lab Results  Component Value Date   TSH 1.440 08/26/2020   Lab Results  Component Value Date   CHOL 150 08/26/2020   HDL 53 08/26/2020   LDLCALC 84 08/26/2020   TRIG 64 08/26/2020   CHOLHDL 2.8 08/26/2020   Lab Results  Component Value Date   WBC 4.9 08/26/2020   HGB 13.5 08/26/2020   HCT 40.4 08/26/2020   MCV 92 08/26/2020   PLT 125 (L) 08/26/2020   Lab Results  Component Value Date   IRON 76 04/27/2009   FERRITIN 30.7 04/27/2009    Attestation Statements:   Reviewed by clinician on day of visit: allergies, medications, problem list, medical history, surgical history, family history, social history, and previous encounter notes.  Coral Ceo, am acting as Location manager for [employer], DO.  I have reviewed the above documentation for accuracy and completeness, and I agree with the above. Marjory Sneddon, D.O.  The [place] was signed into law in 2016 which includes the topic of electronic health records.  This provides immediate access to information in MyChart.  This includes consultation notes, operative notes, office notes, lab results and pathology reports.  If you have any questions about what you read please let us know at your next visit so we can discuss your concerns and take corrective action if need be.  We are right here with you.

## 2020-09-09 ENCOUNTER — Ambulatory Visit (INDEPENDENT_AMBULATORY_CARE_PROVIDER_SITE_OTHER): Payer: BC Managed Care – PPO | Admitting: Family Medicine

## 2020-09-09 ENCOUNTER — Other Ambulatory Visit: Payer: Self-pay

## 2020-09-09 VITALS — BP 147/87 | HR 105 | Temp 99.1°F | Ht 66.0 in | Wt 232.0 lb

## 2020-09-09 DIAGNOSIS — E7849 Other hyperlipidemia: Secondary | ICD-10-CM | POA: Diagnosis not present

## 2020-09-09 DIAGNOSIS — E559 Vitamin D deficiency, unspecified: Secondary | ICD-10-CM

## 2020-09-09 DIAGNOSIS — Z9189 Other specified personal risk factors, not elsewhere classified: Secondary | ICD-10-CM | POA: Diagnosis not present

## 2020-09-09 DIAGNOSIS — E8881 Metabolic syndrome: Secondary | ICD-10-CM | POA: Diagnosis not present

## 2020-09-09 DIAGNOSIS — I10 Essential (primary) hypertension: Secondary | ICD-10-CM | POA: Diagnosis not present

## 2020-09-09 DIAGNOSIS — Z6837 Body mass index (BMI) 37.0-37.9, adult: Secondary | ICD-10-CM

## 2020-09-13 NOTE — Progress Notes (Signed)
Chief Complaint:   OBESITY [name] is here to discuss her progress with her obesity treatment plan along with follow-up of her obesity related diagnoses. [name] is on the Category 2 Plan and states she is following her eating plan approximately 99% of the time. [name] states she is exercising 0 minutes 0 times per week.  Today's visit was #: 2 Starting weight: 237 lbs Starting date: 08/26/2020 Today's weight: 232 lbs Today's date: 09/09/2020 Total lbs lost to date: 5 lbs Total lbs lost since last in-office visit: 5 lbs Total weight loss percentage to date: -2.11%  Interim History: [name] [name] is here today to review her NEW Meal Plan and to discuss all recent labs done here and/ or done at outside facilities.  Extended time was spent counseling [name] [name] on all new disease processes that were discovered or that are worsening. Pt denies cravings and no hunger. Pt denies complaints with meal plan. Going well but says it's a lot of food. Pt gets snack calories form [employer] bars, which she really likes.    Assessment/Plan:   1. White coat syndrome with hypertension Discussed labs with patient today. Pt checks BP daily at home. BP averages 127/72. Highest reading 130/79 and lowest 117/72. She denies headache, vision changes, palpitations, chest pains, etc. Pt denies issues with aldactone or Cozaar.  BP Readings from Last 3 Encounters:  09/09/20 (!) 147/87  08/26/20 (!) 152/100  07/21/20 (!) 149/74   Lab Results  Component Value Date   NA 142 08/26/2020   K 4.2 08/26/2020   CO2 27 08/26/2020   GLUCOSE 76 08/26/2020   BUN 12 08/26/2020   CREATININE 0.85 08/26/2020   CALCIUM 9.6 08/26/2020   GFRNONAA 77 08/26/2020   GFRAA 89 08/26/2020    Plan: Continue meds, as BP at home is within normal limits. Discussed with pt white coat syndrome and educated on disease process. Continue weight loss prudent nutritional plan and decrease salt.  2. Other  hyperlipidemia Discussed labs with patient today. Pt started statin therapy at the end of November 2021. Her LDL has significantly decreased and HDL has increased since starting Crestor 10 mg. She is tolerating it well without side effects or concerns.    Lab Results  Component Value Date   ALT 22 08/26/2020   AST 24 08/26/2020   ALKPHOS 104 08/26/2020   BILITOT 0.5 08/26/2020   Lab Results  Component Value Date   CHOL 150 08/26/2020   HDL 53 08/26/2020   LDLCALC 84 08/26/2020   TRIG 64 08/26/2020   CHOLHDL 2.8 08/26/2020   Plan: Pt's cholesterol is at goal. Continue statin therapy as LFT's are within normal limits and pt is tolerating well. Decrease saturated and trans fats. Increase exercise and weight loss.  3. Insulin resistance New. Discussed labs with patient today. Pt has no history of insulin resistance or pre-DM. She is not on meds. She never thought about carbs and simple vs complex etc. She did not know she had an issue.   Lab Results  Component Value Date   INSULIN 20.0 08/26/2020   Lab Results  Component Value Date   HGBA1C 5.4 08/26/2020   Plan: Extensive education done regarding insulin resistance and treatment options. Decrease simple carbs, increase proteins and lose weight. Recheck in 3 months or so. Encouraged ot to eventually exercise to help IR. We will consider meds in the future prn to aid in weight loss, as well.  4. Vitamin D deficiency Worsening. Discussed  labs with patient today. [name] [name]'s Vitamin D level was 46.9 on 08/26/2020. She is currently taking OTC vitamin D 1000 each day but is not consistent with it. She denies concerns with med/supplement and is tolerating it well.   Ref. Range 08/26/2020 11:21  Vitamin D, 25-Hydroxy Latest Ref Range: 30.0 - 100.0 ng/mL 46.9   Plan: Vit D is not quite at goal. Suboptimal. Increase in winter to 2,000 units daily and back down to 1,000 units in spring and summer. Recheck in 2-3 months. Extensive education done  on the importance of Vit D to her health and also in prevention of various diseases.  5. At risk for diabetes mellitus - [name] [name] was given extensive diabetes prevention education and counseling today of more than 24 minutes.  - Counseled patient on pathophysiology of disease and discussed various treatment options which always includes dietary and lifestyle modification as first line.   - Importance of healthy diet with very limited amounts of simple carbohydrates discussed with patient in addition to regular aerobic exercise to an eventual goal of 38min 5d/week or more.  - Handouts provided at patient's desire and or told to go online at the American Diabetes Association website for further information  6. Class 2 severe obesity with serious comorbidity and body mass index (BMI) of 37.0 to 37.9 in adult, unspecified obesity type [facility]) [name] is currently in the action stage of change. As such, her goal is to continue with weight loss efforts. She has agreed to the Category 2 Plan.   Exercise goals: As is  Behavioral modification strategies: increasing lean protein intake, decreasing simple carbohydrates, decreasing sodium intake, meal planning and cooking strategies, keeping healthy foods in the home, better snacking choices, avoiding temptations and planning for success.  [name] has agreed to follow-up with our clinic in 2 weeks. She was informed of the importance of frequent follow-up visits to maximize her success with intensive lifestyle modifications for her multiple health conditions.   Objective:   Blood pressure (!) 147/87, pulse (!) 105, temperature 99.1 F (37.3 C), height 5\' 6"  (1.676 m), weight 232 lb (105.2 kg), last menstrual period 09/13/2015, SpO2 97 %. Body mass index is 37.45 kg/m.  General: Cooperative, alert, well developed, in no acute distress. HEENT: Conjunctivae and lids unremarkable. Cardiovascular: Regular rhythm.  Lungs: Normal work of  breathing. Neurologic: No focal deficits.   Lab Results  Component Value Date   CREATININE 0.85 08/26/2020   BUN 12 08/26/2020   NA 142 08/26/2020   K 4.2 08/26/2020   CL 102 08/26/2020   CO2 27 08/26/2020   Lab Results  Component Value Date   ALT 22 08/26/2020   AST 24 08/26/2020   ALKPHOS 104 08/26/2020   BILITOT 0.5 08/26/2020   Lab Results  Component Value Date   HGBA1C 5.4 08/26/2020   Lab Results  Component Value Date   INSULIN 20.0 08/26/2020   Lab Results  Component Value Date   TSH 1.440 08/26/2020   Lab Results  Component Value Date   CHOL 150 08/26/2020   HDL 53 08/26/2020   LDLCALC 84 08/26/2020   TRIG 64 08/26/2020   CHOLHDL 2.8 08/26/2020   Lab Results  Component Value Date   WBC 4.9 08/26/2020   HGB 13.5 08/26/2020   HCT 40.4 08/26/2020   MCV 92 08/26/2020   PLT 125 (L) 08/26/2020   Lab Results  Component Value Date   IRON 76 04/27/2009   FERRITIN 30.7 04/27/2009    Attestation Statements:  Reviewed by clinician on day of visit: allergies, medications, problem list, medical history, surgical history, family history, social history, and previous encounter notes.  Coral Ceo, am acting as Location manager for [employer], DO.  I have reviewed the above documentation for accuracy and completeness, and I agree with the above. Marjory Sneddon, D.O.  The [place] was signed into law in 2016 which includes the topic of electronic health records.  This provides immediate access to information in MyChart.  This includes consultation notes, operative notes, office notes, lab results and pathology reports.  If you have any questions about what you read please let us know at your next visit so we can discuss your concerns and take corrective action if need be.  We are right here with you.

## 2020-09-17 ENCOUNTER — Other Ambulatory Visit: Payer: Self-pay | Admitting: Hematology and Oncology

## 2020-09-23 ENCOUNTER — Ambulatory Visit
Admission: RE | Admit: 2020-09-23 | Discharge: 2020-09-23 | Disposition: A | Discharge status: Home / Self Care | Payer: BC Managed Care – PPO | Source: Ambulatory Visit | Attending: Adult Health | Admitting: Adult Health

## 2020-09-23 ENCOUNTER — Other Ambulatory Visit: Payer: Self-pay

## 2020-09-23 DIAGNOSIS — Z853 Personal history of malignant neoplasm of breast: Secondary | ICD-10-CM

## 2020-09-29 ENCOUNTER — Other Ambulatory Visit: Payer: BC Managed Care – PPO

## 2020-09-30 ENCOUNTER — Ambulatory Visit (INDEPENDENT_AMBULATORY_CARE_PROVIDER_SITE_OTHER): Payer: BC Managed Care – PPO | Admitting: Adult Health

## 2020-09-30 ENCOUNTER — Encounter (INDEPENDENT_AMBULATORY_CARE_PROVIDER_SITE_OTHER): Payer: Self-pay | Admitting: Adult Health

## 2020-09-30 ENCOUNTER — Other Ambulatory Visit: Payer: Self-pay

## 2020-09-30 ENCOUNTER — Other Ambulatory Visit: Payer: BC Managed Care – PPO

## 2020-09-30 VITALS — BP 153/88 | HR 100 | Temp 98.2°F | Ht 66.0 in | Wt 231.0 lb

## 2020-09-30 DIAGNOSIS — K5900 Constipation, unspecified: Secondary | ICD-10-CM | POA: Diagnosis not present

## 2020-09-30 DIAGNOSIS — E559 Vitamin D deficiency, unspecified: Secondary | ICD-10-CM | POA: Diagnosis not present

## 2020-09-30 DIAGNOSIS — B9681 Helicobacter pylori [H. pylori] as the cause of diseases classified elsewhere: Secondary | ICD-10-CM

## 2020-09-30 DIAGNOSIS — I10 Essential (primary) hypertension: Secondary | ICD-10-CM

## 2020-09-30 DIAGNOSIS — K2970 Gastritis, unspecified, without bleeding: Secondary | ICD-10-CM

## 2020-09-30 DIAGNOSIS — Z6837 Body mass index (BMI) 37.0-37.9, adult: Secondary | ICD-10-CM

## 2020-10-01 LAB — HELICOBACTER PYLORI  SPECIAL ANTIGEN
MICRO NUMBER:: 11519711
SPECIMEN QUALITY: ADEQUATE

## 2020-10-04 NOTE — Progress Notes (Signed)
Chief Complaint:   OBESITY [name] [name] is here to discuss her progress with her obesity treatment plan along with follow-up of her obesity related diagnoses. [name] [name] is on the Category 2 Plan and states she is following her eating plan approximately 95% of the time. [name] [name] states she is doing 0 minutes 0 times per week.  Today's visit was #: 3 Starting weight: 237 lbs Starting date: 08/26/2020 Today's weight: 231 lbs Today's date: 09/30/2020 Total lbs lost to date: 6 lbs Total lbs lost since last in-office visit: 1 lb  Interim History: [name] [name] has changed the timing of lunch and dinner. She is able to consume dinner meal in the afternoon, however, then she is too full in the evening to consume lunch meal. She will often have popcorn as evening meal.  Subjective:   1. Vitamin D deficiency [name] [name]'s Vitamin D level was 46.9 on 08/26/2020. She is currently taking OTC vitamin D 2,000 each day. She denies nausea, vomiting or muscle weakness. She was previously on Vit D3 1,000 units daily and increased to 5,000 units per day, but is taking 2,000 units.   Ref. Range 08/26/2020 11:21  Vitamin D, 25-Hydroxy Latest Ref Range: 30.0 - 100.0 ng/mL 46.9   2. Essential hypertension SBP and heart rate are slightly elevated at OV. Her ambulatory BP today was 147/82. She is on losartan and spironolactone. She denies acute cardiac symptoms.   BP Readings from Last 3 Encounters:  09/30/20 (!) 153/88  09/09/20 (!) 147/87  08/26/20 (!) 152/100    3. Constipation, unspecified constipation type Pt typically has a bowel movement early morning and over in the evening. The last 2 weeks, she has experienced hard stools and less frequent BM's. She is on OTC Miralax.   Assessment/Plan:   1. Vitamin D deficiency Low Vitamin D level contributes to fatigue and are associated with obesity, breast, and colon cancer. She agrees to continue to take OTC Vitamin D 2,000 IU daily and will follow-up for routine  testing of Vitamin D, at least 2-3 times per year to avoid over-replacement.  2. Essential hypertension [name] [name] is working on healthy weight loss and exercise to improve blood pressure control. We will watch for signs of hypotension as she continues her lifestyle modifications. Follow up with PCP/oncology regarding uncontrolled BP.  3. Constipation, unspecified constipation type Remain well hydrated. Continue OTC Miralax and add OTC colace.  4. Class 2 severe obesity with serious comorbidity and body mass index (BMI) of 37.0 to 37.9 in adult, unspecified obesity type [facility]) [name] [name] is currently in the action stage of change. As such, her goal is to continue with weight loss efforts. She has agreed to the Category 2 Plan.   Continue with "dinner meal" in mid-day.  Pt will have a protein shake with at least 30 g of protein in the evening if she is unable to consume "lunch meal".  Exercise goals: No exercise has been prescribed at this time.  Behavioral modification strategies: increasing lean protein intake, decreasing simple carbohydrates, no skipping meals, meal planning and cooking strategies and planning for success.  [name] [name] has agreed to follow-up with our clinic in 2 weeks. She was informed of the importance of frequent follow-up visits to maximize her success with intensive lifestyle modifications for her multiple health conditions.   Objective:   Blood pressure (!) 153/88, pulse 100, temperature 98.2 F (36.8 C), height 5\' 6"  (1.676 m), weight 231 lb (104.8 kg), last menstrual period 09/13/2015, SpO2 95 %. Body mass index  is 37.28 kg/m.  General: Cooperative, alert, well developed, in no acute distress. HEENT: Conjunctivae and lids unremarkable. Cardiovascular: Regular rhythm.  Lungs: Normal work of breathing. Neurologic: No focal deficits.   Lab Results  Component Value Date   CREATININE 0.85 08/26/2020   BUN 12 08/26/2020   NA 142 08/26/2020   K 4.2 08/26/2020    CL 102 08/26/2020   CO2 27 08/26/2020   Lab Results  Component Value Date   ALT 22 08/26/2020   AST 24 08/26/2020   ALKPHOS 104 08/26/2020   BILITOT 0.5 08/26/2020   Lab Results  Component Value Date   HGBA1C 5.4 08/26/2020   Lab Results  Component Value Date   INSULIN 20.0 08/26/2020   Lab Results  Component Value Date   TSH 1.440 08/26/2020   Lab Results  Component Value Date   CHOL 150 08/26/2020   HDL 53 08/26/2020   LDLCALC 84 08/26/2020   TRIG 64 08/26/2020   CHOLHDL 2.8 08/26/2020   Lab Results  Component Value Date   WBC 4.9 08/26/2020   HGB 13.5 08/26/2020   HCT 40.4 08/26/2020   MCV 92 08/26/2020   PLT 125 (L) 08/26/2020   Lab Results  Component Value Date   IRON 76 04/27/2009   FERRITIN 30.7 04/27/2009    Attestation Statements:   Reviewed by clinician on day of visit: allergies, medications, problem list, medical history, surgical history, family history, social history, and previous encounter notes.  Time spent on visit including pre-visit chart review and post-visit care and charting was 32 minutes.   Coral Ceo, am acting as Location manager for Mina Marble, NP.  I have reviewed the above documentation for accuracy and completeness, and I agree with the above. -  Jeroline Wolbert d. Abdalrahman Clementson, NP-C

## 2020-10-07 ENCOUNTER — Other Ambulatory Visit (HOSPITAL_COMMUNITY): Payer: Self-pay | Admitting: Orthopaedic Surgery

## 2020-10-07 DIAGNOSIS — M546 Pain in thoracic spine: Secondary | ICD-10-CM

## 2020-10-17 NOTE — Assessment & Plan Note (Signed)
08/23/2018:Right lumpectomy: IDC, 0.7 cm, grade 3, margins negative, 0/3 lymph nodes negative, ER 40%, PR 0%, HER-2 negative, Ki-67 50%, T1BN0 stage Ia  Oncotype Dx 44: High Risk  Treatment plan: 1.Adj Chemo with Dose dense adriamycin and Cytoxan foll by Taxol weekly X10stopped early for toxicities 2. Adjuvant radiation therapy7/09/2018-03/28/2019 3. Adjuvant antiestrogen therapy ------------------------------------------------------------------------------------------------------------ Port will be removed 04/15/2019 Treatment plan: Adjuvant antiestrogen therapy with anastrozole 1 mg daily, switched to letrozole, switching to exemestane to start 08/21/2020   Severe back pain issues: Improving after she stopped letrozole Exemestane Toxicities:  RTC in 1 year

## 2020-10-17 NOTE — Progress Notes (Signed)
Patient Care Team: [name], [name], MD as PCP - General (Family Medicine) Erroll Luna, MD as Consulting Physician (General Surgery) Nicholas Lose, MD as Consulting Physician (Hematology and Oncology) Kyung Rudd, MD as Consulting Physician (Radiation Oncology)  DIAGNOSIS:    ICD-10-CM   1. Malignant neoplasm of lower-inner quadrant of right breast of female, estrogen receptor positive ([place])  C50.311    Z17.0     SUMMARY OF ONCOLOGIC HISTORY: Oncology History  Malignant neoplasm of lower-inner quadrant of right breast of female, estrogen receptor positive ([place])  07/16/2018 Initial Diagnosis   Pain right breast UOQ: Negative on mammogram, incidentally found right breast mass LIQ at 3:30 position measuring 0.6 cm biopsy revealed grade 3 IDC ER 40%, PR 0%, HER-2 negative, Ki-67 50%, T1bN0 stage Ib   07/24/2018 Cancer Staging   Staging form: Breast, AJCC 8th Edition - Clinical: Stage IB (cT1b, cN0, cM0, G3, ER+, PR-, HER2-) - Signed by Nicholas Lose, MD on 07/24/2018   08/23/2018 Surgery   Right lumpectomy: IDC, 0.7 cm, grade 3, margins negative, 0/3 lymph nodes negative, ER 40%, PR 0%, HER-2 negative, Ki-67 50%, T1BN0 stage Ia   08/23/2018 Oncotype testing   45/33%   09/04/2018 Cancer Staging   Staging form: Breast, AJCC 8th Edition - Pathologic: Stage IA (pT1b, pN0, cM0, G3, ER+, PR-, HER2-) - Signed by Gardenia Phlegm, NP on 09/04/2018   09/25/2018 - 01/22/2019 Chemotherapy   Adjuvant chemotherapy: completed 4 cycles dose dense Adriamycin and Cytoxan, completed 10 cycles of Taxol, stopped early due to neuropathy    02/19/2019 - 04/07/2019 Radiation Therapy   Adjuvant XRT The patient initially received a dose of 50.4 Gy in 28 fractions to the breast using whole-breast tangent fields. This was delivered using a 3-D conformal technique. The patient then received a boost to the seroma. This delivered an additional 10 Gy in 5 fractions using a 3-field photon boost technique. The  total dose was 60.4 Gy.   04/2019 -  Anti-estrogen oral therapy   Anastrozole switched to letrozole switched to Exemestane      CHIEF COMPLIANT: Follow-up of breast cancer on exemestane  INTERVAL HISTORY: [name] [name] is a 55 y.o. with above-mentioned history of breast cancer who underwent a right lumpectomy, adjuvant chemotherapy, radiation, and is currently on antiestrogen therapy with exemestane. Mammogram on 09/23/20 showed of evidence of malignancy bilaterally. She presents to the clinic today for follow-up.   ALLERGIES:  is allergic to ibuprofen, lisinopril, dilaudid [hydromorphone hcl], and tape.  MEDICATIONS:  Current Outpatient Medications  Medication Sig Dispense Refill  . Cholecalciferol (VITAMIN D3) 125 MCG (5000 UT) TABS Pt reports being on 1,000 iu qd 30 tablet   . clobetasol ointment (TEMOVATE) 0.07 % Apply 1 application topically 2 (two) times daily. Then as needed for 30 days  1  . clotrimazole-betamethasone (LOTRISONE) cream Apply 1 application topically 2 (two) times daily as needed. (Patient taking differently: Apply 1 application topically 2 (two) times daily as needed (irritated skin).) 30 g 0  . exemestane (AROMASIN) 25 MG tablet TAKE 1 TABLET(25 MG) BY MOUTH DAILY AFTER BREAKFAST 90 tablet 0  . famotidine (PEPCID) 40 MG tablet Take 40 mg by mouth daily.    Marland Kitchen levocetirizine (XYZAL) 5 MG tablet Take 5 mg by mouth every evening.    Marland Kitchen losartan (COZAAR) 50 MG tablet Take 50 mg by mouth daily.    . magic mouthwash w/lidocaine SOLN Take 10 mLs by mouth every 8 (eight) hours as needed for mouth pain.    Marland Kitchen  Multiple Vitamins-Minerals (MULTIVITAMIN WITH MINERALS) tablet Take 1 tablet by mouth daily.    . pantoprazole (PROTONIX) 40 MG tablet Take 1 tablet (40 mg total) by mouth 2 (two) times daily. 20 tablet 0  . Probiotic Product (PRO-BIOTIC BLEND PO) Take 1 capsule by mouth daily.    . rosuvastatin (CRESTOR) 10 MG tablet Take 1 tablet (10 mg total) by mouth daily. 30  tablet 5  . spironolactone (ALDACTONE) 50 MG tablet TAKE 1 TABLET(50 MG) BY MOUTH DAILY (Patient taking differently: Take 50 mg by mouth daily.) 90 tablet 0   No current facility-administered medications for this visit.    PHYSICAL EXAMINATION: ECOG PERFORMANCE STATUS: 1 - Symptomatic but completely ambulatory  Vitals:   10/18/20 1429  BP: (!) 144/84  Pulse: 99  Resp: 18  Temp: (!) 97.5 F (36.4 C)  SpO2: 100%   Filed Weights   10/18/20 1429  Weight: 237 lb 1.6 oz (107.5 kg)    BREAST: No palpable masses or nodules in either right or left breasts. No palpable axillary supraclavicular or infraclavicular adenopathy no breast tenderness or nipple discharge. (exam performed in the presence of a chaperone)  LABORATORY DATA:  I have reviewed the data as listed CMP Latest Ref Rng & Units 08/26/2020 07/06/2020 06/21/2020  Glucose 65 - 99 mg/dL 76 103(H) 95  BUN 6 - 24 mg/dL _0 Creatinine 0.57 - 1.00 mg/dL 0.85 0.96 1.09(H)  Sodium 134 - 144 mmol/L 142 138 142  Potassium 3.5 - 5.2 mmol/L 4.2 3.9 4.4  Chloride 96 - 106 mmol/L 102 104 104  CO2 20 - 29 mmol/L _1 Calcium 8.7 - 10.2 mg/dL 9.6 9.3 9.7  Total Protein 6.0 - 8.5 g/dL 7.3 7.6 8.2(H)  Total Bilirubin 0.0 - 1.2 mg/dL 0.5 0.4 0.7  Alkaline Phos 44 - 121 IU/L 104 82 98  AST 0 - 40 IU/L _2 ALT 0 - 32 IU/L _3 Lab Results  Component Value Date   WBC 4.9 08/26/2020   HGB 13.5 08/26/2020   HCT 40.4 08/26/2020   MCV 92 08/26/2020   PLT 125 (L) 08/26/2020   NEUTROABS 3.1 08/26/2020    ASSESSMENT & PLAN:  Malignant neoplasm of lower-inner quadrant of right breast of female, estrogen receptor positive ([facility]) 08/23/2018:Right lumpectomy: IDC, 0.7 cm, grade 3, margins negative, 0/3 lymph nodes negative, ER 40%, PR 0%, HER-2 negative, Ki-67 50%, T1BN0 stage Ia  Oncotype Dx 44: High Risk  Treatment plan: 1.Adj Chemo with Dose dense adriamycin and Cytoxan foll by Taxol weekly X10stopped early for  toxicities 2. Adjuvant radiation therapy7/09/2018-03/28/2019 3. Adjuvant antiestrogen therapy ------------------------------------------------------------------------------------------------------------ Port will be removed 04/15/2019 Treatment plan: Adjuvant antiestrogen therapy with anastrozole 1 mg daily, switched to letrozole, switching to exemestane to start 08/21/2020  Severe back pain issues: Improving after she stopped letrozole  Exemestane Toxicities:  Intermittent muscle spasms  RTC in 1 year  No orders of the defined types were placed in this encounter.  The patient has a good understanding of the overall plan. she agrees with it. she will call with any problems that may develop before the next visit here.  Total time spent: 20 mins including face to face time and time spent for planning, charting and coordination of care  Rulon Eisenmenger, MD, MPH 10/18/2020  I, Cloyde Reams Dorshimer, am acting as scribe for Dr. Nicholas Lose.  I have reviewed the above documentation for accuracy and completeness, and I agree with  the above.       

## 2020-10-18 ENCOUNTER — Inpatient Hospital Stay: Payer: BC Managed Care – PPO | Attending: Hematology and Oncology | Admitting: Hematology and Oncology

## 2020-10-18 ENCOUNTER — Other Ambulatory Visit: Payer: Self-pay

## 2020-10-18 DIAGNOSIS — C50311 Malignant neoplasm of lower-inner quadrant of right female breast: Secondary | ICD-10-CM | POA: Diagnosis not present

## 2020-10-18 DIAGNOSIS — Z9221 Personal history of antineoplastic chemotherapy: Secondary | ICD-10-CM | POA: Insufficient documentation

## 2020-10-18 DIAGNOSIS — Z923 Personal history of irradiation: Secondary | ICD-10-CM | POA: Insufficient documentation

## 2020-10-18 DIAGNOSIS — Z170 Estrogen receptor positive status [ER+]: Secondary | ICD-10-CM | POA: Insufficient documentation

## 2020-10-18 DIAGNOSIS — Z79811 Long term (current) use of aromatase inhibitors: Secondary | ICD-10-CM | POA: Insufficient documentation

## 2020-10-18 DIAGNOSIS — Z79899 Other long term (current) drug therapy: Secondary | ICD-10-CM | POA: Insufficient documentation

## 2020-10-18 MED ORDER — EXEMESTANE 25 MG PO TABS
25.0000 mg | ORAL_TABLET | Freq: Every day | ORAL | 3 refills | Status: DC
Start: 2020-10-18 — End: 2021-10-18

## 2020-10-21 ENCOUNTER — Encounter (INDEPENDENT_AMBULATORY_CARE_PROVIDER_SITE_OTHER): Payer: Self-pay | Admitting: Family Medicine

## 2020-10-21 ENCOUNTER — Other Ambulatory Visit: Payer: Self-pay

## 2020-10-21 ENCOUNTER — Ambulatory Visit (INDEPENDENT_AMBULATORY_CARE_PROVIDER_SITE_OTHER): Payer: BC Managed Care – PPO | Admitting: Family Medicine

## 2020-10-21 VITALS — BP 137/84 | HR 83 | Temp 98.2°F | Ht 66.0 in | Wt 228.0 lb

## 2020-10-21 DIAGNOSIS — E8881 Metabolic syndrome: Secondary | ICD-10-CM | POA: Diagnosis not present

## 2020-10-21 DIAGNOSIS — Z9189 Other specified personal risk factors, not elsewhere classified: Secondary | ICD-10-CM

## 2020-10-21 DIAGNOSIS — K219 Gastro-esophageal reflux disease without esophagitis: Secondary | ICD-10-CM | POA: Diagnosis not present

## 2020-10-21 DIAGNOSIS — I10 Essential (primary) hypertension: Secondary | ICD-10-CM

## 2020-10-21 DIAGNOSIS — Z6836 Body mass index (BMI) 36.0-36.9, adult: Secondary | ICD-10-CM

## 2020-10-21 DIAGNOSIS — E88819 Insulin resistance, unspecified: Secondary | ICD-10-CM | POA: Insufficient documentation

## 2020-10-21 MED ORDER — METFORMIN HCL 500 MG PO TABS: ORAL_TABLET | ORAL | 0 refills | Status: DC

## 2020-10-22 ENCOUNTER — Ambulatory Visit (HOSPITAL_COMMUNITY)
Admission: RE | Admit: 2020-10-22 | Discharge: 2020-10-22 | Disposition: A | Discharge status: Home / Self Care | Payer: BC Managed Care – PPO | Source: Ambulatory Visit | Attending: Orthopaedic Surgery | Admitting: Orthopaedic Surgery

## 2020-10-22 DIAGNOSIS — M546 Pain in thoracic spine: Secondary | ICD-10-CM | POA: Diagnosis not present

## 2020-10-25 NOTE — Progress Notes (Signed)
Chief Complaint:   OBESITY [name] [name] is here to discuss her progress with her obesity treatment plan along with follow-up of her obesity related diagnoses.   Today's visit was #: 4 Starting weight: 237 lbs Starting date: 08/26/2020 Today's weight: 228 lbs Today's date: 10/21/2020 Total lbs lost to date: 9 lbs Body mass index is 36.8 kg/m.  Total weight loss percentage to date: -3.80%  Interim History:  [name] [name] is here for a follow up office visit.  she is following the meal plan without concern or issues.  Patient's meal and food recall appears to be accurate and consistent with what is on the plan.  When on plan, her hunger and cravings are well controlled.    Song's last office with me was on 09/09/2020.  She saw Mina Marble, NP, at her last office visit on 09/30/2020.  Plan:  Try to use low carb tortillas and eat lunch proteins since she thinks bread constipates her.  Also, [employer] Lavish Bread is okay.  Current Meal Plan: the Category 2 Plan for 95% of the time.  Current Exercise Plan: Walking for 30 minutes 2 times per week.  Assessment/Plan:   No orders of the defined types were placed in this encounter.   Medications Discontinued During This Encounter  Medication Reason  . famotidine (PEPCID) 40 MG tablet   . levocetirizine (XYZAL) 5 MG tablet   . magic mouthwash w/lidocaine SOLN      Meds ordered this encounter  Medications  . metFORMIN (GLUCOPHAGE) 500 MG tablet    Sig: 1/2 tab po with breakfast daily    Dispense:  30 tablet    Refill:  0      1. Insulin resistance Not at goal. Goal is HgbA1c < 5.7, fasting insulin closer to 5.  Medication: metformin 250 mg daily.  She endorses more cravings for sweets lately.  She says she cannot control it.  She skips the lunchtime meal most of the time.    Plan:  She will start metformin 500 mg daily.  She will start with 1/2 tablet daily with breakfast.  Handout provided today.  She will continue  to focus on protein-rich, low simple carbohydrate foods. We reviewed the importance of hydration, regular exercise for stress reduction, and restorative sleep.   Lab Results  Component Value Date   HGBA1C 5.4 08/26/2020   Lab Results  Component Value Date   INSULIN 20.0 08/26/2020   - Start metFORMIN (GLUCOPHAGE) 500 MG tablet; 1/2 tab po with breakfast daily  Dispense: 30 tablet; Refill: 0  2. Gastroesophageal reflux disease, unspecified whether esophagitis present [name] has history of H. pylori recently.  Retest was negative.  No longer having symptoms.  On Protonix 40 mg daily.  Plan:  Continue to avoid nighttime meals/snacks.  Continue medication.  Continue prudent nutritional plan and weight loss.    We reviewed the diagnosis of GERD and importance of treatment. We discussed "red flag" symptoms and the importance of follow up if symptoms persisted despite treatment. We reviewed non-pharmacologic management of GERD symptoms: including: caffeine reduction, dietary changes, elevate HOB, NPO after supper, reduction of alcohol intake, tobacco cessation, and weight loss.  3. White coat syndrome with hypertension At goal. Medications: Norvasc 5 mg daily (added 2-3 weeks ago by PCP), losartan 50 mg daily, Aldactone 50 mg daily.  Rogelio's PCP started her on Norvasc.  At home blood pressures run 130s/80s with the addition of Norvasc to her regimen.  Plan:  Continue medications  and home blood pressure monitoring.  Continue prudent nutritional plan and weight loss.  Increase exercise as tolerated.  BP Readings from Last 3 Encounters:  10/21/20 137/84  10/18/20 (!) 144/84  09/30/20 (!) 153/88   Lab Results  Component Value Date   CREATININE 0.85 08/26/2020   4. At risk for diabetes mellitus - Martena was given diabetes prevention education and counseling today of more than 9 minutes.  - Counseled patient on pathophysiology of disease and meaning/ implication of lab results.  -  Reviewed how certain foods can either stimulate or inhibit insulin release, and subsequently affect hunger pathways  - Importance of following a healthy meal plan with limiting amounts of simple carbohydrates discussed with patient - Effects of regular aerobic exercise on blood sugar regulation reviewed and encouraged an eventual goal of 30 min 5d/week or more as a minimum.  - Briefly discussed treatment options, which always include dietary and lifestyle modification as first line.   - Handouts provided at patient's desire and/or told to go online to the American Diabetes Association website for further information.  5. Class 2 severe obesity with serious comorbidity and body mass index (BMI) of 36.0 to 36.9 in adult, unspecified obesity type ([place])  Course: Rula is currently in the action stage of change. As such, her goal is to continue with weight loss efforts.   Nutrition goals: She has agreed to the Category 2 Plan.   Exercise goals: Increase to 5 days per week as tolerated.  Behavioral modification strategies: increasing lean protein intake, decreasing simple carbohydrates, increasing water intake, meal planning and cooking strategies, better snacking choices and planning for success.  Ameerah has agreed to follow-up with our clinic in 2 weeks with Abby Potash, PA-C, and in 4 weeks with me. She was informed of the importance of frequent follow-up visits to maximize her success with intensive lifestyle modifications for her multiple health conditions.   Objective:   Blood pressure 137/84, pulse 83, temperature 98.2 F (36.8 C), height 5\' 6"  (1.676 m), weight 228 lb (103.4 kg), last menstrual period 09/13/2015, SpO2 96 %. Body mass index is 36.8 kg/m.  General: Cooperative, alert, well developed, in no acute distress. HEENT: Conjunctivae and lids unremarkable. Cardiovascular: Regular rhythm.  Lungs: Normal work of breathing. Neurologic: No focal deficits.   Lab Results   Component Value Date   CREATININE 0.85 08/26/2020   BUN 12 08/26/2020   NA 142 08/26/2020   K 4.2 08/26/2020   CL 102 08/26/2020   CO2 27 08/26/2020   Lab Results  Component Value Date   ALT 22 08/26/2020   AST 24 08/26/2020   ALKPHOS 104 08/26/2020   BILITOT 0.5 08/26/2020   Lab Results  Component Value Date   HGBA1C 5.4 08/26/2020   Lab Results  Component Value Date   INSULIN 20.0 08/26/2020   Lab Results  Component Value Date   TSH 1.440 08/26/2020   Lab Results  Component Value Date   CHOL 150 08/26/2020   HDL 53 08/26/2020   LDLCALC 84 08/26/2020   TRIG 64 08/26/2020   CHOLHDL 2.8 08/26/2020   Lab Results  Component Value Date   WBC 4.9 08/26/2020   HGB 13.5 08/26/2020   HCT 40.4 08/26/2020   MCV 92 08/26/2020   PLT 125 (L) 08/26/2020   Lab Results  Component Value Date   IRON 76 04/27/2009   FERRITIN 30.7 04/27/2009   Attestation Statements:   Reviewed by clinician on day of visit: allergies, medications, problem  list, medical history, surgical history, family history, social history, and previous encounter notes.  I, Water quality scientist, CMA, am acting as Location manager for [employer], DO.  I have reviewed the above documentation for accuracy and completeness, and I agree with the above. Marjory Sneddon, D.O.  The [place] was signed into law in 2016 which includes the topic of electronic health records.  This provides immediate access to information in MyChart.  This includes consultation notes, operative notes, office notes, lab results and pathology reports.  If you have any questions about what you read please let us know at your next visit so we can discuss your concerns and take corrective action if need be.  We are right here with you.

## 2020-11-04 ENCOUNTER — Other Ambulatory Visit: Payer: Self-pay

## 2020-11-04 ENCOUNTER — Encounter (INDEPENDENT_AMBULATORY_CARE_PROVIDER_SITE_OTHER): Payer: Self-pay | Admitting: Physician Assistant

## 2020-11-04 ENCOUNTER — Ambulatory Visit (INDEPENDENT_AMBULATORY_CARE_PROVIDER_SITE_OTHER): Payer: BC Managed Care – PPO | Admitting: Physician Assistant

## 2020-11-04 VITALS — BP 127/80 | HR 96 | Temp 98.7°F | Ht 66.0 in | Wt 230.0 lb

## 2020-11-04 DIAGNOSIS — Z6837 Body mass index (BMI) 37.0-37.9, adult: Secondary | ICD-10-CM | POA: Diagnosis not present

## 2020-11-04 DIAGNOSIS — E8881 Metabolic syndrome: Secondary | ICD-10-CM

## 2020-11-04 DIAGNOSIS — K5900 Constipation, unspecified: Secondary | ICD-10-CM | POA: Diagnosis not present

## 2020-11-04 DIAGNOSIS — Z9189 Other specified personal risk factors, not elsewhere classified: Secondary | ICD-10-CM | POA: Diagnosis not present

## 2020-11-10 NOTE — Progress Notes (Signed)
Chief Complaint:   OBESITY [name] [name] is here to discuss her progress with her obesity treatment plan along with follow-up of her obesity related diagnoses. [name] [name] is on the Category 2 Plan and states she is following her eating plan approximately 90% of the time. [name] [name] states she is walking for 30 minutes 3 times per week.  Today's visit was #: 5 Starting weight: 237 lbs Starting date: 08/26/2020 Today's weight: 230 lbs Today's date: 11/04/2020 Total lbs lost to date: 7 Total lbs lost since last in-office visit: 0  Interim History: [name] [name] is unsure why she has gained weight today. She has been eating her meals on the plan (after review with her), but she has been drinking 2 protein shakes daily, eating Sun chips with her sandwich, and not measuring her salad dressing. Her caloric intake is much higher than her RMR.  Subjective:   1. Constipation, unspecified constipation type [place] denies blood in her stool or black stools. She has decreased cheese and dairy.  2. Insulin resistance [name] [name] did not take metformin because she thought she could drink protein shakes instead of taking her medications.   3. At risk for diabetes mellitus [name] [name] is at higher than average risk for developing diabetes due to obesity.   Assessment/Plan:   1. Constipation, unspecified constipation type [name] [name] was informed that a decrease in bowel movement frequency is normal while losing weight, but stools should not be hard or painful. [name] [name] is to take OTC metamucil daily. She may take miralax as needed, and increase fiber, water, and walking. Orders and follow up as documented in patient record.   Counseling Getting to Good Bowel Health: Your goal is to have one soft bowel movement each day. Drink at least 8 glasses of water each day. Eat plenty of fiber (goal is over 25 grams each day). It is best to get most of your fiber from dietary sources which includes leafy green  vegetables, fresh fruit, and whole grains. You may need to add fiber with the help of OTC fiber supplements. These include Metamucil, Citrucel, and Flaxseed. If you are still having trouble, try adding Miralax or Magnesium Citrate. If all of these changes do not work, Cabin crew.  2. Insulin resistance [name] [name] will start metformin 1/2 tablet with breakfast. She will continue to work on weight loss, exercise, and decreasing simple carbohydrates to help decrease the risk of diabetes. [name] [name] agreed to follow-up with [name] [name] as directed to closely monitor her progress.  3. At risk for diabetes mellitus [name] [name] was given approximately 15 minutes of diabetes education and counseling today. We discussed intensive lifestyle modifications today with an emphasis on weight loss as well as increasing exercise and decreasing simple carbohydrates in her diet. We also reviewed medication options with an emphasis on risk versus benefit of those discussed.   Repetitive spaced learning was employed today to elicit superior memory formation and behavioral change.  4. Class 2 severe obesity with serious comorbidity and body mass index (BMI) of 37.0 to 37.9 in adult, unspecified obesity type [name] [name]) [name] [name] is currently in the action stage of change. As such, her goal is to continue with weight loss efforts. She has agreed to the Category 2 Plan.   [name] [name] is to keep snack calories at 200.  Exercise goals: As is.  Behavioral modification strategies: meal planning and cooking strategies and better snacking choices.  [name] [name] has agreed to follow-up with our clinic in 2 weeks. She was informed of the importance of frequent  follow-up visits to maximize her success with intensive lifestyle modifications for her multiple health conditions.   Objective:   Blood pressure 127/80, pulse 96, temperature 98.7 F (37.1 C), height 5\' 6"  (1.676 m), weight 230 lb (104.3 kg), last menstrual period 09/13/2015,  SpO2 98 %. Body mass index is 37.12 kg/m.  General: Cooperative, alert, well developed, in no acute distress. HEENT: Conjunctivae and lids unremarkable. Cardiovascular: Regular rhythm.  Lungs: Normal work of breathing. Neurologic: No focal deficits.   Lab Results  Component Value Date   CREATININE 0.85 08/26/2020   BUN 12 08/26/2020   NA 142 08/26/2020   K 4.2 08/26/2020   CL 102 08/26/2020   CO2 27 08/26/2020   Lab Results  Component Value Date   ALT 22 08/26/2020   AST 24 08/26/2020   ALKPHOS 104 08/26/2020   BILITOT 0.5 08/26/2020   Lab Results  Component Value Date   HGBA1C 5.4 08/26/2020   Lab Results  Component Value Date   INSULIN 20.0 08/26/2020   Lab Results  Component Value Date   TSH 1.440 08/26/2020   Lab Results  Component Value Date   CHOL 150 08/26/2020   HDL 53 08/26/2020   LDLCALC 84 08/26/2020   TRIG 64 08/26/2020   CHOLHDL 2.8 08/26/2020   Lab Results  Component Value Date   WBC 4.9 08/26/2020   HGB 13.5 08/26/2020   HCT 40.4 08/26/2020   MCV 92 08/26/2020   PLT 125 (L) 08/26/2020   Lab Results  Component Value Date   IRON 76 04/27/2009   FERRITIN 30.7 04/27/2009   Attestation Statements:   Reviewed by clinician on day of visit: allergies, medications, problem list, medical history, surgical history, family history, social history, and previous encounter notes.   Wilhemena Durie, am acting as transcriptionist for [employer], PA-C.  I have reviewed the above documentation for accuracy and completeness, and I agree with the above. Abby Potash, PA-C

## 2020-11-11 ENCOUNTER — Ambulatory Visit (HOSPITAL_COMMUNITY)
Admission: EM | Admit: 2020-11-11 | Discharge: 2020-11-11 | Disposition: A | Discharge status: Home / Self Care | Payer: BC Managed Care – PPO | Attending: Emergency Medicine | Admitting: Emergency Medicine

## 2020-11-11 ENCOUNTER — Encounter (HOSPITAL_COMMUNITY): Payer: Self-pay | Admitting: Emergency Medicine

## 2020-11-11 ENCOUNTER — Other Ambulatory Visit: Payer: Self-pay

## 2020-11-11 DIAGNOSIS — G44209 Tension-type headache, unspecified, not intractable: Secondary | ICD-10-CM | POA: Diagnosis not present

## 2020-11-11 DIAGNOSIS — M542 Cervicalgia: Secondary | ICD-10-CM

## 2020-11-11 MED ORDER — NAPROXEN 500 MG PO TABS: 500.0000 mg | ORAL_TABLET | Freq: Two times a day (BID) | ORAL | 0 refills | Status: DC | PRN

## 2020-11-11 MED ORDER — TIZANIDINE HCL 4 MG PO TABS: 4.0000 mg | ORAL_TABLET | Freq: Four times a day (QID) | ORAL | 0 refills | Status: DC | PRN

## 2020-11-11 NOTE — ED Provider Notes (Signed)
[facility]    CSN: [id number] Arrival date & time: 11/11/20  1409      History   Chief Complaint Chief Complaint  Patient presents with  . Otalgia    Bilateral  . Headache    HPI [name] [name] is a 55 y.o. female.   [name] [name] is a 55 y.o. here with chief complaint of neck pain, pain behind both ears, and headache.  Reports pain radiates to back of head and behind ears for the past 2 weeks. Reports taking Tylenol and using ice with minimal relief.   Denies any trauma.  Denies any specific alleviating or aggravating factors.   Denies any fevers, chest pain, shortness of breath, N/V/D, abdominal pain, or headaches.    ROS: As per HPI, all other pertinent ROS negative    The history is provided by the patient.  Otalgia Associated symptoms: headaches   Headache Associated symptoms: ear pain     Past Medical History:  Diagnosis Date  . Allergy    seasonal  . Anal fissure   . Back pain   . Breast cancer ([place]) 2019   R BREAST IDC  . Complication of anesthesia   . Constipation   . Diverticulitis   . Fatty liver   . GERD (gastroesophageal reflux disease) 07/07/2005  . Heartburn   . Hemorrhoid   . Hepatic steatosis   . Hypertension   . Internal hemorrhoids   . Joint pain   . Malignant neoplasm of lower-inner quadrant of right breast of female, estrogen receptor positive ([place]) 07/16/2018  . Personal history of chemotherapy   . Personal history of radiation therapy   . PONV (postoperative nausea and vomiting)   . Positive H. pylori test   . SOBOE (shortness of breath on exertion)     Patient Active Problem List   Diagnosis Date Noted  . Insulin resistance 10/21/2020  . Gastroesophageal reflux disease 10/21/2020  . White coat syndrome with hypertension 10/21/2020  . At risk for diabetes mellitus 10/21/2020  . Constipation 09/30/2020  . SOBOE (shortness of breath on exertion) 08/26/2020  . Vitamin D deficiency 08/26/2020  .  Other fatigue 08/26/2020  . Malignant neoplasm of right female breast ([place]) 08/26/2020  . At risk for impaired metabolic function 32/95/1884  . Elevated coronary artery calcium score 07/14/2020  . Mixed hyperlipidemia 07/14/2020  . Atypical chest pain 07/08/2020  . Chronic fatigue 07/07/2020  . Left arm pain 12/05/2019  . Leg edema 05/31/2019  . Port-A-Cath in place 09/25/2018  . Malignant neoplasm of lower-inner quadrant of right breast of female, estrogen receptor positive ([place]) 07/16/2018  . Screening cholesterol level 07/10/2017  . Anal fissure 07/07/2014  . Pruritic rash 06/16/2014  . Class 2 severe obesity with serious comorbidity and body mass index (BMI) of 37.0 to 37.9 in adult ([place]) 06/16/2014  . Primary localized osteoarthrosis, lower leg 11/18/2013  . Bilateral knee pain 09/08/2013  . VITAMIN B12 DEFICIENCY 02/10/2010  . Essential hypertension 08/16/2007  . ALLERGIC RHINITIS 08/16/2007  . GERD 08/16/2007  . Right upper quadrant pain 05/13/2007    Past Surgical History:  Procedure Laterality Date  . BREAST BIOPSY Right 07/12/2018   R BREAST IDC  . BREAST LUMPECTOMY Right 08/22/2018   IDC  . BREAST LUMPECTOMY WITH RADIOACTIVE SEED AND SENTINEL LYMPH NODE BIOPSY Right 08/23/2018   Procedure: RIGHT BREAST LUMPECTOMY WITH RADIOACTIVE SEED AND RIGHT SENTINEL LYMPH NODE MAPPING;  Surgeon: Erroll Luna, MD;  Location: [facility];  Service: General;  Laterality: Right;  . COLON RESECTION  2000's Dr. Hassell Done   Diverticulitis.  . COLONOSCOPY    . FOOT SURGERY Left   . g3p2 c-section1    . KNEE SURGERY Right   . PORT-A-CATH REMOVAL Right 04/15/2019   Procedure: REMOVAL PORT-A-CATH;  Surgeon: Erroll Luna, MD;  Location: [facility];  Service: General;  Laterality: Right;  MAC AND LOCAL ANESTHETIC  . PORTACATH PLACEMENT Right 09/24/2018   Procedure: INSERTION PORT-A-CATH WITH ULTRASOUND;  Surgeon: Erroll Luna, MD;  Location: [facility];  Service:  General;  Laterality: Right;  . TONSILLECTOMY    . TUBAL LIGATION      OB History    Gravida  3   Para  3   Term      Preterm      AB      Living        SAB      IAB      Ectopic      Multiple      Live Births               Home Medications    Prior to Admission medications   Medication Sig Start Date End Date Taking? Authorizing Provider  naproxen (NAPROSYN) 500 MG tablet Take 1 tablet (500 mg total) by mouth 2 (two) times daily as needed for headache. 11/11/20  Yes Pearson Forster, NP  tiZANidine (ZANAFLEX) 4 MG tablet Take 1 tablet (4 mg total) by mouth every 6 (six) hours as needed for muscle spasms. 11/11/20  Yes Pearson Forster, NP  amLODipine (NORVASC) 5 MG tablet Take 5 mg by mouth daily.    [provider]  Cholecalciferol (VITAMIN D3) 125 MCG (5000 UT) TABS Pt reports being on 1,000 iu qd 08/26/20   Opalski, Neoma Laming, DO  clobetasol ointment (TEMOVATE) 6.38 % Apply 1 application topically 2 (two) times daily. Then as needed for 30 days 07/03/18   [provider]  clotrimazole-betamethasone (LOTRISONE) cream Apply 1 application topically 2 (two) times daily as needed. Patient taking differently: Apply 1 application topically 2 (two) times daily as needed (irritated skin). 11/25/19   Pyrtle, Lajuan Lines, MD  exemestane (AROMASIN) 25 MG tablet Take 1 tablet (25 mg total) by mouth daily after breakfast. 10/18/20   Nicholas Lose, MD  losartan (COZAAR) 50 MG tablet Take 50 mg by mouth daily.    [provider]  metFORMIN (GLUCOPHAGE) 500 MG tablet 1/2 tab po with breakfast daily 10/21/20   Opalski, Neoma Laming, DO  Multiple Vitamins-Minerals (MULTIVITAMIN WITH MINERALS) tablet Take 1 tablet by mouth daily.    [provider]  pantoprazole (PROTONIX) 40 MG tablet Take 1 tablet (40 mg total) by mouth 2 (two) times daily. Patient taking differently: Take 40 mg by mouth daily. 07/08/20   Pyrtle, Lajuan Lines, MD  Probiotic Product (PRO-BIOTIC BLEND PO)  Take 1 capsule by mouth daily.    [provider]  rosuvastatin (CRESTOR) 10 MG tablet Take 1 tablet (10 mg total) by mouth daily. 07/14/20 10/12/20  Patwardhan, Reynold Bowen, MD  spironolactone (ALDACTONE) 50 MG tablet TAKE 1 TABLET(50 MG) BY MOUTH DAILY Patient taking differently: Take 50 mg by mouth daily. 07/28/19   Patwardhan, Reynold Bowen, MD    Family History Family History  Problem Relation Age of Onset  . Hypertension Mother   . Breast cancer Mother 20  . Cancer Mother   . Alcoholism Mother   . Drug abuse Mother   .  Hypertension Father   . Heart disease Father   . Heart attack Father   . Hypertension Brother   . Diabetes Neg Hx   . COPD Neg Hx   . Colon cancer Neg Hx   . Stomach cancer Neg Hx   . Pancreatic cancer Neg Hx   . Esophageal cancer Neg Hx   . Rectal cancer Neg Hx     Social History Social History   Tobacco Use  . Smoking status: Never Smoker  . Smokeless tobacco: Never Used  Vaping Use  . Vaping Use: Never used  Substance Use Topics  . Alcohol use: No    Alcohol/week: 0.0 standard drinks  . Drug use: No     Allergies   Ibuprofen, Lisinopril, Dilaudid [hydromorphone hcl], and Tape   Review of Systems Review of Systems  HENT: Positive for ear pain.   Neurological: Positive for headaches.     Physical Exam Triage Vital Signs ED Triage Vitals  Enc Vitals Group     BP 11/11/20 1446 120/81     Pulse Rate 11/11/20 1446 94     Resp 11/11/20 1446 16     Temp 11/11/20 1446 97.8 F (36.6 C)     Temp Source 11/11/20 1446 Oral     SpO2 11/11/20 1446 100 %     Weight --      Height --      Head Circumference --      Peak Flow --      Pain Score 11/11/20 1444 10     Pain Loc --      Pain Edu? --      Excl. in [place]? --    No data found.  Updated Vital Signs BP 120/81 (BP Location: Left Arm)   Pulse 94   Temp 97.8 F (36.6 C) (Oral)   Resp 16   LMP 09/13/2015 Comment: celebate, BTL  SpO2 100%   Visual Acuity Right Eye Distance:    Left Eye Distance:   Bilateral Distance:    Right Eye Near:   Left Eye Near:    Bilateral Near:     Physical Exam Vitals and nursing note reviewed.  Constitutional:      General: She is not in acute distress.    Appearance: Normal appearance. She is not ill-appearing, toxic-appearing or diaphoretic.  HENT:     Head: Normocephalic and atraumatic.     Right Ear: Hearing, tympanic membrane, ear canal and external ear normal.     Left Ear: Hearing, tympanic membrane, ear canal and external ear normal.  Eyes:     General: No visual field deficit.    Extraocular Movements: Extraocular movements intact.     Conjunctiva/sclera: Conjunctivae normal.     Pupils: Pupils are equal, round, and reactive to light.  Cardiovascular:     Rate and Rhythm: Normal rate.     Pulses: Normal pulses.  Pulmonary:     Effort: Pulmonary effort is normal.  Abdominal:     General: Abdomen is flat.  Musculoskeletal:        General: Normal range of motion.     Cervical back: Normal range of motion. No edema, signs of trauma or rigidity. Muscular tenderness (tenderness to neck on palpation) present.  Skin:    General: Skin is warm and dry.  Neurological:     General: No focal deficit present.     Mental Status: She is alert and oriented to person, place, and time.     GCS:  GCS eye subscore is 4. GCS verbal subscore is 5. GCS motor subscore is 6.     Cranial Nerves: No cranial nerve deficit, dysarthria or facial asymmetry.     Sensory: No sensory deficit.     Motor: No weakness.     Coordination: Coordination normal.     Gait: Gait normal.  Psychiatric:        Mood and Affect: Mood normal.        Speech: Speech normal.      UC Treatments / Results  Labs (all labs ordered are listed, but only abnormal results are displayed) Labs Reviewed - No data to display  EKG   Radiology No results found.  Procedures Procedures (including critical care time)  Medications Ordered in UC Medications -  No data to display  Initial Impression / Assessment and Plan / UC Course  I have reviewed the triage vital signs and the nursing notes.  Pertinent labs & imaging results that were available during my care of the patient were reviewed by me and considered in my medical decision making (see chart for details).     Tension Headache Cervicalgia Assessment negative for red flags or concerns, including infection or meningitis.  Take naproxen BID and then PRN Zanaflex PRN at night Apply heat for comfort Rest and drink lots of fluids.    Follow up with PCP as needed.  Final Clinical Impressions(s) / UC Diagnoses   Final diagnoses:  Cervicalgia  Tension headache     Discharge Instructions     Take the Naproxen twice a day for the next few days and then take it as needed.  Take the Zanaflex as needed at night.  It can make you sleepy so don't take it before driving.  Use heat or a heating pad on neck.    Rest and drink lots of fluids.    Follow up with your Primary Care Provider or go to the Emergency Department if symptoms worsen or do not improve in the next few days.      ED Prescriptions    Medication Sig Dispense Auth. Provider   naproxen (NAPROSYN) 500 MG tablet Take 1 tablet (500 mg total) by mouth 2 (two) times daily as needed for headache. 30 tablet Pearson Forster, NP   tiZANidine (ZANAFLEX) 4 MG tablet Take 1 tablet (4 mg total) by mouth every 6 (six) hours as needed for muscle spasms. 30 tablet Pearson Forster, NP     PDMP not reviewed this encounter.   Pearson Forster, NP 11/11/20 1517

## 2020-11-11 NOTE — ED Triage Notes (Signed)
Pt presents with bilateral ear pain and headache.

## 2020-11-11 NOTE — Discharge Instructions (Addendum)
Take the Naproxen twice a day for the next few days and then take it as needed.  Take the Zanaflex as needed at night.  It can make you sleepy so don't take it before driving.  Use heat or a heating pad on neck.    Rest and drink lots of fluids.    Follow up with your Primary Care Provider or go to the Emergency Department if symptoms worsen or do not improve in the next few days.

## 2020-12-02 ENCOUNTER — Ambulatory Visit (INDEPENDENT_AMBULATORY_CARE_PROVIDER_SITE_OTHER): Payer: BC Managed Care – PPO | Admitting: Family Medicine

## 2020-12-20 ENCOUNTER — Encounter (INDEPENDENT_AMBULATORY_CARE_PROVIDER_SITE_OTHER): Payer: Self-pay | Admitting: Family Medicine

## 2020-12-20 ENCOUNTER — Other Ambulatory Visit: Payer: Self-pay

## 2020-12-20 ENCOUNTER — Ambulatory Visit (INDEPENDENT_AMBULATORY_CARE_PROVIDER_SITE_OTHER): Payer: BC Managed Care – PPO | Admitting: Family Medicine

## 2020-12-20 VITALS — BP 132/85 | HR 95 | Temp 98.0°F | Ht 66.0 in | Wt 227.0 lb

## 2020-12-20 DIAGNOSIS — Z9189 Other specified personal risk factors, not elsewhere classified: Secondary | ICD-10-CM | POA: Diagnosis not present

## 2020-12-20 DIAGNOSIS — E8881 Metabolic syndrome: Secondary | ICD-10-CM

## 2020-12-20 DIAGNOSIS — Z6838 Body mass index (BMI) 38.0-38.9, adult: Secondary | ICD-10-CM | POA: Diagnosis not present

## 2020-12-20 MED ORDER — METFORMIN HCL 500 MG PO TABS: ORAL_TABLET | ORAL | 0 refills | Status: DC

## 2020-12-28 NOTE — Progress Notes (Signed)
Chief Complaint:   OBESITY [name] [name] is here to discuss her progress with her obesity treatment plan along with follow-up of her obesity related diagnoses.   Today's visit was #: 6 Starting weight: 237 lbs Starting date: 08/26/2020 Today's weight: 227 lbs Today's date: 12/20/2020 Total lbs lost to date: 10 lbs Body mass index is 36.64 kg/m.  Total weight loss percentage to date: -4.22%  Interim History:  [name] [name] is here for a follow up office visit and she is following the meal plan without concerns or issues.  Patient's meal and food recall appears to be accurate and consistent with what is on the plan.  When on plan, her hunger and cravings are well controlled.    [name] has started to increase her exercise at the gym.  She has been (on her own) keeping a written journal of all foods she eats.  She feels it helps her stay on track.  Current Meal Plan: the Category 2 Plan for 85% of the time.  Current Exercise Plan: Going to the gym for 60 minutes 3-4 times per week.  Assessment/Plan:   1. Insulin resistance Not at goal. Goal is HgbA1c < 5.7, fasting insulin closer to 5.  Medication: metformin 250 mg daily.  Started metformin last office visit.  No sure if it is helping yet.  Tolerating well, without side effects.   Plan:  Increase metformin from 250-500 mg every morning (new script given).  She will continue to focus on protein-rich, low simple carbohydrate foods. We reviewed the importance of hydration, regular exercise for stress reduction, and restorative sleep.  Counseling done.  Lab Results  Component Value Date   HGBA1C 5.4 08/26/2020   Lab Results  Component Value Date   INSULIN 20.0 08/26/2020   - Increase metFORMIN (GLUCOPHAGE) 500 MG tablet; 1 tab po with breakfast daily  Dispense: 30 tablet; Refill: 0  2. At risk for diabetes mellitus - [name] was given diabetes prevention education and counseling today of more than 8 minutes.  -  Counseled patient on pathophysiology of disease and meaning/ implication of lab results.  - Reviewed how certain foods can either stimulate or inhibit insulin release, and subsequently affect hunger pathways  - Importance of following a healthy meal plan with limiting amounts of simple carbohydrates discussed with patient - Effects of regular aerobic exercise on blood sugar regulation reviewed and encouraged an eventual goal of 30 min 5d/week or more as a minimum.  - Briefly discussed treatment options, which always include dietary and lifestyle modification as first line.   - Handouts provided at patient's desire and/or told to go online to the American Diabetes Association website for further information.  3. Obesity, current BMI 36.7  Course: [name] [name] is currently in the action stage of change. As such, her goal is to continue with weight loss efforts.   Nutrition goals: She has agreed to the Category 2 Plan.   Exercise goals: As is.  Behavioral modification strategies: increasing lean protein intake, decreasing simple carbohydrates and increasing water intake.  [name] has agreed to follow-up with our clinic in 3 weeks. She was informed of the importance of frequent follow-up visits to maximize her success with intensive lifestyle modifications for her multiple health conditions.   Objective:   Blood pressure 132/85, pulse 95, temperature 98 F (36.7 C), height 5\' 6"  (1.676 m), weight 227 lb (103 kg), last menstrual period 09/13/2015, SpO2 99 %. Body mass index is 36.64 kg/m.  General: Cooperative, alert,  well developed, in no acute distress. HEENT: Conjunctivae and lids unremarkable. Cardiovascular: Regular rhythm.  Lungs: Normal work of breathing. Neurologic: No focal deficits.   Lab Results  Component Value Date   CREATININE 0.85 08/26/2020   BUN 12 08/26/2020   NA 142 08/26/2020   K 4.2 08/26/2020   CL 102 08/26/2020   CO2 27 08/26/2020   Lab Results  Component  Value Date   ALT 22 08/26/2020   AST 24 08/26/2020   ALKPHOS 104 08/26/2020   BILITOT 0.5 08/26/2020   Lab Results  Component Value Date   HGBA1C 5.4 08/26/2020   Lab Results  Component Value Date   INSULIN 20.0 08/26/2020   Lab Results  Component Value Date   TSH 1.440 08/26/2020   Lab Results  Component Value Date   CHOL 150 08/26/2020   HDL 53 08/26/2020   LDLCALC 84 08/26/2020   TRIG 64 08/26/2020   CHOLHDL 2.8 08/26/2020   Lab Results  Component Value Date   WBC 4.9 08/26/2020   HGB 13.5 08/26/2020   HCT 40.4 08/26/2020   MCV 92 08/26/2020   PLT 125 (L) 08/26/2020   Lab Results  Component Value Date   IRON 76 04/27/2009   FERRITIN 30.7 04/27/2009   Attestation Statements:   Reviewed by clinician on day of visit: allergies, medications, problem list, medical history, surgical history, family history, social history, and previous encounter notes.  I, Water quality scientist, CMA, am acting as Location manager for [employer], DO.  I have reviewed the above documentation for accuracy and completeness, and I agree with the above. Marjory Sneddon, D.O.  The [place] was signed into law in 2016 which includes the topic of electronic health records.  This provides immediate access to information in MyChart.  This includes consultation notes, operative notes, office notes, lab results and pathology reports.  If you have any questions about what you read please let us know at your next visit so we can discuss your concerns and take corrective action if need be.  We are right here with you.

## 2021-01-10 ENCOUNTER — Other Ambulatory Visit: Payer: Self-pay

## 2021-01-10 ENCOUNTER — Ambulatory Visit (INDEPENDENT_AMBULATORY_CARE_PROVIDER_SITE_OTHER): Payer: BC Managed Care – PPO | Admitting: Physician Assistant

## 2021-01-10 ENCOUNTER — Encounter (INDEPENDENT_AMBULATORY_CARE_PROVIDER_SITE_OTHER): Payer: Self-pay | Admitting: Physician Assistant

## 2021-01-10 VITALS — BP 120/72 | HR 90 | Temp 98.6°F | Ht 66.0 in | Wt 227.0 lb

## 2021-01-10 DIAGNOSIS — Z6837 Body mass index (BMI) 37.0-37.9, adult: Secondary | ICD-10-CM | POA: Diagnosis not present

## 2021-01-10 DIAGNOSIS — E8881 Metabolic syndrome: Secondary | ICD-10-CM | POA: Diagnosis not present

## 2021-01-10 NOTE — Progress Notes (Signed)
Chief Complaint:   OBESITY [name] [name] is here to discuss her progress with her obesity treatment plan along with follow-up of her obesity related diagnoses. [name] [name] is on the Category 2 Plan and states she is following her eating plan approximately 80% of the time. [name] [name] states she is at the gym on the elliptical and walking for 60 minutes 3-4 times per week.  Today's visit was #: 7 Starting weight: 237 lbs Starting date: 08/26/2020 Today's weight: 227 lbs Today's date: 01/10/2021 Total lbs lost to date: 10 Total lbs lost since last in-office visit: 0  Interim History: [name] [name] reports that she has been eating cookout food. She is also journaling her food. The possibility of adding Saxenda was discussed today. She cannot tolerate metformin.  Subjective:   1. Insulin resistance [name] [name] reports metformin gives her yeast infections and she does not want to take it.  Assessment/Plan:   1. Insulin resistance We discussed GLP-1 today, and [name] [name] will think about it and let [name] [name] know. She will continue to work on weight loss, exercise, and decreasing simple carbohydrates to help decrease the risk of diabetes. [name] [name] agreed to follow-up with [name] [name] as directed to closely monitor her progress.  2. Class 2 severe obesity with serious comorbidity and body mass index (BMI) of 37.0 to 37.9 in adult, unspecified obesity type [facility]) [name] [name] is currently in the action stage of change. As such, her goal is to continue with weight loss efforts. She has agreed to keeping a food journal and adhering to recommended goals of 1200-1300 calories and 90 grams of protein daily.   Exercise goals: As is.  Behavioral modification strategies: increasing lean protein intake and meal planning and cooking strategies.  [name] [name] has agreed to follow-up with our clinic in 3 weeks. She was informed of the importance of frequent follow-up visits to maximize her success with intensive lifestyle modifications  for her multiple health conditions.   Objective:   Blood pressure 120/72, pulse 90, temperature 98.6 F (37 C), height 5\' 6"  (1.676 m), weight 227 lb (103 kg), last menstrual period 09/13/2015, SpO2 98 %. Body mass index is 36.64 kg/m.  General: Cooperative, alert, well developed, in no acute distress. HEENT: Conjunctivae and lids unremarkable. Cardiovascular: Regular rhythm.  Lungs: Normal work of breathing. Neurologic: No focal deficits.   Lab Results  Component Value Date   CREATININE 0.85 08/26/2020   BUN 12 08/26/2020   NA 142 08/26/2020   K 4.2 08/26/2020   CL 102 08/26/2020   CO2 27 08/26/2020   Lab Results  Component Value Date   ALT 22 08/26/2020   AST 24 08/26/2020   ALKPHOS 104 08/26/2020   BILITOT 0.5 08/26/2020   Lab Results  Component Value Date   HGBA1C 5.4 08/26/2020   Lab Results  Component Value Date   INSULIN 20.0 08/26/2020   Lab Results  Component Value Date   TSH 1.440 08/26/2020   Lab Results  Component Value Date   CHOL 150 08/26/2020   HDL 53 08/26/2020   LDLCALC 84 08/26/2020   TRIG 64 08/26/2020   CHOLHDL 2.8 08/26/2020   Lab Results  Component Value Date   WBC 4.9 08/26/2020   HGB 13.5 08/26/2020   HCT 40.4 08/26/2020   MCV 92 08/26/2020   PLT 125 (L) 08/26/2020   Lab Results  Component Value Date   IRON 76 04/27/2009   FERRITIN 30.7 04/27/2009   Attestation Statements:   Reviewed by clinician on day of visit: allergies, medications,  problem list, medical history, surgical history, family history, social history, and previous encounter notes.  Time spent on visit including pre-visit chart review and post-visit care and charting was 30 minutes.    Wilhemena Durie, am acting as Location manager for [employer].  I have reviewed the above documentation for accuracy and completeness, and I agree with the above. Abby Potash, PA-C

## 2021-01-12 ENCOUNTER — Encounter: Payer: Self-pay | Admitting: Physician Assistant

## 2021-01-12 ENCOUNTER — Ambulatory Visit: Payer: BC Managed Care – PPO | Admitting: Physician Assistant

## 2021-01-12 VITALS — BP 100/72 | HR 92 | Ht 66.0 in | Wt 230.2 lb

## 2021-01-12 DIAGNOSIS — K648 Other hemorrhoids: Secondary | ICD-10-CM | POA: Diagnosis not present

## 2021-01-12 DIAGNOSIS — K219 Gastro-esophageal reflux disease without esophagitis: Secondary | ICD-10-CM | POA: Diagnosis not present

## 2021-01-12 MED ORDER — PANTOPRAZOLE SODIUM 40 MG PO TBEC
40.0000 mg | DELAYED_RELEASE_TABLET | Freq: Every day | ORAL | 1 refills | Status: DC
Start: 2021-01-12 — End: 2022-08-29

## 2021-01-12 NOTE — Patient Instructions (Signed)
If you are age 58 or older, your body mass index should be between 23-30. Your Body mass index is 37.16 kg/m. If this is out of the aforementioned range listed, please consider follow up with your Primary Care Provider.  If you are age 55 or younger, your body mass index should be between 19-25. Your Body mass index is 37.16 kg/m. If this is out of the aformentioned range listed, please consider follow up with your Primary Care Provider.   Continue your Pantoprazole 40 mg 1 tablet every morning.  Use your Anusol suppositories at night as needed for hemorrhoid symptoms.  You have been scheduled for a Hemorrhoid banding with Dr. Hilarie Fredrickson on 02/28/2021 at 11:00 am  Thank you for entrusting me with your care and choosing [facility].  Amy Esterwood, PA-C

## 2021-01-12 NOTE — Progress Notes (Signed)
Subjective:    Patient ID: [name] [name], female    DOB: 1966/06/12, 55 y.o.   MRN: [id number]  HPI [name] [name] is a pleasant 55 year old African-American female known to Dr. Hilarie Fredrickson who comes in today for follow-up.  She was last seen in November 2021 at which time she underwent upper endoscopy for complaints of dysphagia.  EGD was negative without findings of esophageal stricture or ring.  She was empirically dilated/entire esophagus to 52 mm. Patient says that her dysphagia resolved, and she has not had recurrence.  She does have chronic GERD symptoms and is currently on Protonix 40 mg once daily which seems to be working well.  She was also found to have H. pylori at the time of the endoscopy that has been treated and she has follow-up H. pylori stool antigen testing which was negative. Last colonoscopy was done in 2019 and normal with 10-year interval follow-up recommended. She has had prior hemorrhoidal banding x2 with the last session spring 2021.  She was seen in June 2021, and was not having any significant symptoms at that time, further banding was discussed of the left lateral internal hemorrhoid. Today she says that she does have intermittent straining and still suffers from some issues with constipation.  She does better if she stays on MiraLAX on a daily basis.  She has noted occasional small-volume bright red blood per rectum and feels that she does have some prolapse of the internal hemorrhoid at times.  This will self reduce.  She is not having any significant pain or discomfort.  She would like to proceed with further banding to have the hemorrhoids eradicated.  Review of Systems Pertinent positive and negative review of systems were noted in the above HPI section.  All other review of systems was otherwise negative.  Outpatient Encounter Medications as of 01/12/2021  Medication Sig  . amLODipine (NORVASC) 5 MG tablet Take 5 mg by mouth daily.  . Cholecalciferol (VITAMIN D3)  125 MCG (5000 UT) TABS Pt reports being on 1,000 iu qd  . clobetasol ointment (TEMOVATE) 8.50 % Apply 1 application topically 2 (two) times daily. Then as needed for 30 days  . clotrimazole-betamethasone (LOTRISONE) cream Apply 1 application topically 2 (two) times daily as needed. (Patient taking differently: Apply 1 application topically 2 (two) times daily as needed (irritated skin).)  . exemestane (AROMASIN) 25 MG tablet Take 1 tablet (25 mg total) by mouth daily after breakfast.  . losartan (COZAAR) 50 MG tablet Take 50 mg by mouth daily.  . Multiple Vitamins-Minerals (MULTIVITAMIN WITH MINERALS) tablet Take 1 tablet by mouth daily.  . naproxen (NAPROSYN) 500 MG tablet Take 1 tablet (500 mg total) by mouth 2 (two) times daily as needed for headache.  . Probiotic Product (PRO-BIOTIC BLEND PO) Take 1 capsule by mouth daily.  Marland Kitchen spironolactone (ALDACTONE) 50 MG tablet TAKE 1 TABLET(50 MG) BY MOUTH DAILY (Patient taking differently: Take 50 mg by mouth daily.)  . tiZANidine (ZANAFLEX) 4 MG tablet Take 1 tablet (4 mg total) by mouth every 6 (six) hours as needed for muscle spasms.  . [DISCONTINUED] pantoprazole (PROTONIX) 40 MG tablet Take 1 tablet (40 mg total) by mouth 2 (two) times daily. (Patient taking differently: Take 40 mg by mouth daily.)  . pantoprazole (PROTONIX) 40 MG tablet Take 1 tablet (40 mg total) by mouth daily.  . rosuvastatin (CRESTOR) 10 MG tablet Take 1 tablet (10 mg total) by mouth daily.   No facility-administered encounter medications on file as of  01/12/2021.   Allergies  Allergen Reactions  . Ibuprofen Swelling    SWELLING REACTION UNSPECIFIED   . Lisinopril Swelling    angioedema  . Dilaudid [Hydromorphone Hcl] Nausea Only  . Tape Rash   Patient Active Problem List   Diagnosis Date Noted  . Insulin resistance 10/21/2020  . Gastroesophageal reflux disease 10/21/2020  . White coat syndrome with hypertension 10/21/2020  . At risk for diabetes mellitus 10/21/2020   . Constipation 09/30/2020  . SOBOE (shortness of breath on exertion) 08/26/2020  . Vitamin D deficiency 08/26/2020  . Other fatigue 08/26/2020  . Malignant neoplasm of right female breast ([place]) 08/26/2020  . At risk for impaired metabolic function 46/96/2952  . Elevated coronary artery calcium score 07/14/2020  . Mixed hyperlipidemia 07/14/2020  . Atypical chest pain 07/08/2020  . Chronic fatigue 07/07/2020  . Left arm pain 12/05/2019  . Leg edema 05/31/2019  . Port-A-Cath in place 09/25/2018  . Malignant neoplasm of lower-inner quadrant of right breast of female, estrogen receptor positive ([place]) 07/16/2018  . Screening cholesterol level 07/10/2017  . Anal fissure 07/07/2014  . Pruritic rash 06/16/2014  . Class 2 severe obesity with serious comorbidity and body mass index (BMI) of 37.0 to 37.9 in adult ([place]) 06/16/2014  . Primary localized osteoarthrosis, lower leg 11/18/2013  . Bilateral knee pain 09/08/2013  . VITAMIN B12 DEFICIENCY 02/10/2010  . Essential hypertension 08/16/2007  . ALLERGIC RHINITIS 08/16/2007  . GERD 08/16/2007  . Right upper quadrant pain 05/13/2007   Social History   Socioeconomic History  . Marital status: Divorced    Spouse name: Not on file  . Number of children: 3  . Years of education: 75  . Highest education level: Not on file  Occupational History  . 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 Smoking status: Never Smoker  . Smokeless tobacco: Never Used  Vaping Use  . Vaping Use: Never used  Substance and Sexual Activity  . Alcohol use: No    Alcohol/week: 0.0 standard drinks  . Drug use: No  . Sexual activity: Not Currently    Birth control/protection: Post-menopausal  Other Topics Concern  . Not on file  Social History Narrative   [employer], [employer] - Human service/sociology. Married '2000 - 61yrs/seperated. 2 boys - '82, '87, 1 dtr - '81.   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    Social Determinants of Health   Financial  Resource Strain: Not on file  Food Insecurity: Not on file  Transportation Needs: Not on file  Physical Activity: Not on file  Stress: Not on file  Social Connections: Not on file  Intimate Partner Violence: Not on file    Ms. Bares's family history includes Alcoholism in her mother; Breast cancer (age of onset: 83) in her mother; Cancer in her mother; Drug abuse in her mother; Heart attack in her father; Heart disease in her father; Hypertension in her brother, father, and mother.      Objective:    Vitals:   01/12/21 0937  BP: 100/72  Pulse: 92  SpO2: 99%    Physical Exam Well-developed well-nourished older African-American female in no acute distress.  Height, Weight, 230 BMI 37.1  HEENT; nontraumatic normocephalic, EOMI, PE R LA, sclera anicteric. Oropharynx; not examined today Neck; supple, no JVD Cardiovascular; regular rate and rhythm with S1-S2, no murmur rub or gallop Pulmonary; Clear bilaterally Abdomen; soft, nontender, nondistended, no palpable mass or hepatosplenomegaly, bowel sounds are active Rectal; no external hemorrhoids  noted, on anoscopy she has 1 column of internal hemorrhoid on the left Skin; benign exam, no jaundice rash or appreciable lesions Extremities; no clubbing cyanosis or edema skin warm and dry Neuro/Psych; alert and oriented x4, grossly nonfocal mood and affect appropriate       Assessment & Plan:   #26 55 year old African-American female with history of chronic GERD, currently stable on once daily Protonix 40 mg #2 history of dysphagia-resolved after esophageal dilation empirically November 2021 #3 colon cancer surveillance-up-to-date with negative colonoscopy 2019 indicated for 10-year interval follow-up #4 symptomatic internal hemorrhoids, patient has had prior hemorrhoidal banding, she does have 1 column of internal hemorrhoid on the left which again was noted on anoscopy today.  She has been having intermittent symptoms with intermittent  small-volume rectal bleeding and intermittent prolapse with bowel movements. #5 history of H. pylori, treated and eradicated  Plan; continue Protonix 40 mg p.o. every morning Continue antireflux diet. Continue MiraLAX 17 g in 8 ounces of water daily, liberal water intake. Patient will be scheduled for hemorrhoidal banding session with Dr. Hilarie Fredrickson which she has had previously. She has a prescription for Anusol [employer] suppositories which she can use on an as-needed basis.  Larren Copes S Chanson Teems PA-C 01/12/2021   Cc: [name], [name], MD

## 2021-01-20 NOTE — Progress Notes (Signed)
Addendum: Reviewed and agree with assessment and management plan. Allexis Bordenave M, MD  

## 2021-01-26 ENCOUNTER — Ambulatory Visit (INDEPENDENT_AMBULATORY_CARE_PROVIDER_SITE_OTHER): Payer: BC Managed Care – PPO

## 2021-01-26 ENCOUNTER — Other Ambulatory Visit: Payer: Self-pay | Admitting: Podiatry

## 2021-01-26 ENCOUNTER — Other Ambulatory Visit: Payer: Self-pay

## 2021-01-26 ENCOUNTER — Ambulatory Visit: Payer: BC Managed Care – PPO | Admitting: Podiatry

## 2021-01-26 ENCOUNTER — Encounter: Payer: Self-pay | Admitting: Podiatry

## 2021-01-26 DIAGNOSIS — M7751 Other enthesopathy of right foot: Secondary | ICD-10-CM

## 2021-01-26 DIAGNOSIS — M25571 Pain in right ankle and joints of right foot: Secondary | ICD-10-CM

## 2021-01-26 MED ORDER — TRIAMCINOLONE ACETONIDE 10 MG/ML IJ SUSP
10.0000 mg | Freq: Once | INTRAMUSCULAR | Status: AC
  Administered 2021-01-26: 10 mg

## 2021-01-26 NOTE — Progress Notes (Signed)
Subjective:   Patient ID: [name] [name], female   DOB: 55 y.o.   MRN: [id number]   HPI Patient states her ankle has started to hurt again and it did very well for around 6 months   ROS      Objective:  Physical Exam  Neurovascular status intact with inflammation pain of the sinus tarsi right fluid buildup within the sinus tarsi     Assessment:  Inflammatory sinus tarsitis right     Plan:  Sterile prep injected the sinus tarsi right 3 mg Kenalog 5 mg Xylocaine advised on reduced activity reappoint to recheck

## 2021-01-31 ENCOUNTER — Ambulatory Visit (INDEPENDENT_AMBULATORY_CARE_PROVIDER_SITE_OTHER): Payer: BC Managed Care – PPO | Admitting: Family Medicine

## 2021-02-02 ENCOUNTER — Ambulatory Visit (INDEPENDENT_AMBULATORY_CARE_PROVIDER_SITE_OTHER): Payer: BC Managed Care – PPO | Admitting: Family Medicine

## 2021-02-02 ENCOUNTER — Other Ambulatory Visit: Payer: Self-pay

## 2021-02-02 VITALS — BP 124/81 | HR 92 | Temp 98.4°F | Ht 66.0 in | Wt 222.0 lb

## 2021-02-02 DIAGNOSIS — Z9189 Other specified personal risk factors, not elsewhere classified: Secondary | ICD-10-CM | POA: Diagnosis not present

## 2021-02-02 DIAGNOSIS — Z6838 Body mass index (BMI) 38.0-38.9, adult: Secondary | ICD-10-CM

## 2021-02-02 DIAGNOSIS — E8881 Metabolic syndrome: Secondary | ICD-10-CM

## 2021-02-08 ENCOUNTER — Other Ambulatory Visit: Payer: Self-pay | Admitting: Cardiology

## 2021-02-08 DIAGNOSIS — E782 Mixed hyperlipidemia: Secondary | ICD-10-CM

## 2021-02-14 NOTE — Progress Notes (Signed)
Chief Complaint:   OBESITY Tuere is here to discuss her progress with her obesity treatment plan along with follow-up of her obesity related diagnoses.   Today's visit was #: 8 Starting weight: 237 lbs Starting date: 08/26/2020 Today's weight: 222 lbs Today's date: 02/02/2021 Weight change since last visit: 5 lbs Total lbs lost to date: 15 lbs Body mass index is 35.83 kg/m.  Total weight loss percentage to date: -6.33%  Interim History:  Llesenia Fogal is here for a follow up office visit and she is following the meal plan without concerns or issues.  Patient's meal and food recall appears to be accurate and consistent with what is on the plan.  When on plan, her hunger and cravings are well controlled.    Elias says she has been eating out less, and for snacks she has been having more fruit.  Current Meal Plan: keeping a food journal and adhering to recommended goals of 1200-1300 calories and 80 grams of protein for 80% of the time.  Current Exercise Plan: None.  Assessment/Plan:   1. Insulin resistance Not at goal. Goal is HgbA1c < 5.7, fasting insulin closer to 5.  Medication: None.  Unable to tolerate metformin due to yeast infection.  Plan:  Declines GLP-1 today.  She is not sure what her coverage is and will check with her insurance.  Handout given to her on what to ask them.  She will continue to focus on protein-rich, low simple carbohydrate foods. We reviewed the importance of hydration, regular exercise for stress reduction, and restorative sleep.   Lab Results  Component Value Date   HGBA1C 5.4 08/26/2020   Lab Results  Component Value Date   INSULIN 20.0 08/26/2020   2. At risk for side effect of medication Due to Swathi's current conditions and medications, she is at a higher risk for drug side effect.  At least 9 minutes was spent on counseling her about these concerns today.  We discussed the benefits and potential risks of these  medications, and all of patient's concerns were addressed and questions were answered.  she will call us, or their PCP or other specialists who treat their conditions with medications, with any questions or concerns that may develop.     3. Class 2 severe obesity with serious comorbidity and body mass index (BMI) of 38.0 to 38.9 in adult, unspecified obesity type ([place])  Course: Alayiah is currently in the action stage of change. As such, her goal is to continue with weight loss efforts.   Nutrition goals: She has agreed to keeping a food journal and adhering to recommended goals of 1200-1300 calories and 80 grams of protein.   Exercise goals:  As is.  Behavioral modification strategies: increasing lean protein intake, decreasing simple carbohydrates, and planning for success.  Riyan has agreed to follow-up with our clinic in 3 weeks. She was informed of the importance of frequent follow-up visits to maximize her success with intensive lifestyle modifications for her multiple health conditions.   Objective:   Blood pressure 124/81, pulse 92, temperature 98.4 F (36.9 C), height 5\' 6"  (1.676 m), weight 222 lb (100.7 kg), last menstrual period 09/13/2015, SpO2 99 %. Body mass index is 35.83 kg/m.  General: Cooperative, alert, well developed, in no acute distress. HEENT: Conjunctivae and lids unremarkable. Cardiovascular: Regular rhythm.  Lungs: Normal work of breathing. Neurologic: No focal deficits.   Lab Results  Component Value Date   CREATININE 0.85 08/26/2020   BUN  12 08/26/2020   NA 142 08/26/2020   K 4.2 08/26/2020   CL 102 08/26/2020   CO2 27 08/26/2020   Lab Results  Component Value Date   ALT 22 08/26/2020   AST 24 08/26/2020   ALKPHOS 104 08/26/2020   BILITOT 0.5 08/26/2020   Lab Results  Component Value Date   HGBA1C 5.4 08/26/2020   Lab Results  Component Value Date   INSULIN 20.0 08/26/2020   Lab Results  Component Value Date   TSH 1.440  08/26/2020   Lab Results  Component Value Date   CHOL 150 08/26/2020   HDL 53 08/26/2020   LDLCALC 84 08/26/2020   TRIG 64 08/26/2020   CHOLHDL 2.8 08/26/2020   Lab Results  Component Value Date   WBC 4.9 08/26/2020   HGB 13.5 08/26/2020   HCT 40.4 08/26/2020   MCV 92 08/26/2020   PLT 125 (L) 08/26/2020   Lab Results  Component Value Date   IRON 76 04/27/2009   FERRITIN 30.7 04/27/2009   Attestation Statements:   Reviewed by clinician on day of visit: allergies, medications, problem list, medical history, surgical history, family history, social history, and previous encounter notes.  I, Water quality scientist, CMA, am acting as Location manager for [employer], DO.  I have reviewed the above documentation for accuracy and completeness, and I agree with the above. Marjory Sneddon, D.O.  The [place] was signed into law in 2016 which includes the topic of electronic health records.  This provides immediate access to information in MyChart.  This includes consultation notes, operative notes, office notes, lab results and pathology reports.  If you have any questions about what you read please let us know at your next visit so we can discuss your concerns and take corrective action if need be.  We are right here with you.

## 2021-02-24 ENCOUNTER — Other Ambulatory Visit: Payer: Self-pay

## 2021-02-24 ENCOUNTER — Encounter (INDEPENDENT_AMBULATORY_CARE_PROVIDER_SITE_OTHER): Payer: Self-pay | Admitting: Physician Assistant

## 2021-02-24 ENCOUNTER — Ambulatory Visit (INDEPENDENT_AMBULATORY_CARE_PROVIDER_SITE_OTHER): Payer: BC Managed Care – PPO | Admitting: Physician Assistant

## 2021-02-24 VITALS — BP 124/81 | HR 81 | Temp 98.1°F | Ht 66.0 in | Wt 225.0 lb

## 2021-02-24 DIAGNOSIS — Z9189 Other specified personal risk factors, not elsewhere classified: Secondary | ICD-10-CM

## 2021-02-24 DIAGNOSIS — Z6838 Body mass index (BMI) 38.0-38.9, adult: Secondary | ICD-10-CM | POA: Diagnosis not present

## 2021-02-24 DIAGNOSIS — R7301 Impaired fasting glucose: Secondary | ICD-10-CM | POA: Diagnosis not present

## 2021-02-24 MED ORDER — OZEMPIC (0.25 OR 0.5 MG/DOSE) 2 MG/1.5ML ~~LOC~~ SOPN: 0.2500 mg | PEN_INJECTOR | SUBCUTANEOUS | 0 refills | Status: DC

## 2021-02-28 ENCOUNTER — Ambulatory Visit: Payer: BC Managed Care – PPO | Admitting: Internal Medicine

## 2021-02-28 ENCOUNTER — Encounter (INDEPENDENT_AMBULATORY_CARE_PROVIDER_SITE_OTHER): Payer: Self-pay | Admitting: Physician Assistant

## 2021-02-28 ENCOUNTER — Encounter: Payer: Self-pay | Admitting: Internal Medicine

## 2021-02-28 VITALS — BP 112/66 | HR 99 | Ht 66.0 in | Wt 226.0 lb

## 2021-02-28 DIAGNOSIS — R195 Other fecal abnormalities: Secondary | ICD-10-CM

## 2021-02-28 DIAGNOSIS — K5909 Other constipation: Secondary | ICD-10-CM | POA: Diagnosis not present

## 2021-02-28 DIAGNOSIS — K648 Other hemorrhoids: Secondary | ICD-10-CM

## 2021-02-28 NOTE — Telephone Encounter (Signed)
PA

## 2021-02-28 NOTE — Patient Instructions (Signed)
Please purchase the following medications over the counter and take as directed: Colace 100 mg daily   If you are age 57 or younger, your body mass index should be between 19-25. Your Body mass index is 36.48 kg/m. If this is out of the aformentioned range listed, please consider follow up with your Primary Care Provider.   __________________________________________________________  The [facility] providers would like to encourage you to use [facility] to communicate with providers for non-urgent requests or questions.  Due to long hold times on the telephone, sending your provider a message by [facility] may be a faster and more efficient way to get a response.  Please allow 48 business hours for a response.  Please remember that this is for non-urgent requests.   Due to recent changes in healthcare laws, you may see the results of your imaging and laboratory studies on MyChart before your provider has had a chance to review them.  We understand that in some cases there may be results that are confusing or concerning to you. Not all laboratory results come back in the same time frame and the provider may be waiting for multiple results in order to interpret others.  Please give [name] 48 hours in order for your provider to thoroughly review all the results before contacting the office for clarification of your results.  ___________________________________  [facility] PROCEDURE    FOLLOW-UP CARE   The procedure you have had should have been relatively painless since the banding of the area involved does not have nerve endings and there is no pain sensation.  The rubber band cuts off the blood supply to the hemorrhoid and the band may fall off as soon as 48 hours after the banding (the band may occasionally be seen in the toilet bowl following a bowel movement). You may notice a temporary feeling of fullness in the rectum which should respond adequately to plain Tylenol or Motrin.  Following the  banding, avoid strenuous exercise that evening and resume full activity the next day.  A sitz bath (soaking in a warm tub) or bidet is soothing, and can be useful for cleansing the area after bowel movements.     To avoid constipation, take two tablespoons of natural wheat bran, natural oat bran, flax, Benefiber or any over the counter fiber supplement and increase your water intake to 7-8 glasses daily.    Unless you have been prescribed anorectal medication, do not put anything inside your rectum for two weeks: No suppositories, enemas, fingers, etc.  Occasionally, you may have more bleeding than usual after the banding procedure.  This is often from the untreated hemorrhoids rather than the treated one.  Don't be concerned if there is a tablespoon or so of blood.  If there is more blood than this, lie flat with your bottom higher than your head and apply an ice pack to the area. If the bleeding does not stop within a half an hour or if you feel faint, call our office at [phone] or go to the emergency room.  Problems are not common; however, if there is a substantial amount of bleeding, severe pain, chills, fever or difficulty passing urine (very rare) or other problems, you should call us at [phone] or report to the nearest emergency room.  Do not stay seated continuously for more than 2-3 hours for a day or two after the procedure.  Tighten your buttock muscles 10-15 times every two hours and take 10-15 deep breaths  every 1-2 hours.  Do not spend more than a few minutes on the toilet if you cannot empty your bowel; instead re-visit the toilet at a later time.

## 2021-03-02 NOTE — Progress Notes (Signed)
   Subjective:    Patient ID: [name] [name], female    DOB: 10/24/1965, 55 y.o.   MRN: [id number]  HPI [name] [name] is a 55 year old female known to me with a history of symptomatic hemorrhoids, intermittent constipation, GERD with dysphagia who is seen for symptomatic hemorrhoids.  She was last here on 01/12/2021 and seen by Nicoletta Ba, PA-C.  She reports that she has continued to have issues with rectal bleeding and perianal discomfort which she attributes to her hemorrhoids.  She is also having issues with hard stool and the need to strain at bowel movement.  She has been using Colace on occasion and when doing so it has been helpful.  No abdominal pain.  Heartburn is mostly controlled with pantoprazole 40 mg daily but she is also using Mylanta some at bedtime.  Review of Systems As per HPI, otherwise negative  Current Medications, Allergies, Past Medical History, Past Surgical History, Family History and Social History were reviewed in [employer] record.    Objective:   Physical Exam BP 112/66   Pulse 99   Ht 5\' 6"  (1.676 m)   Wt 226 lb (102.5 kg)   LMP 09/13/2015 Comment: celebate, BTL  SpO2 98%   BMI 36.48 kg/m  Gen: awake, alert, NAD HEENT: anicteric Abd: soft, NT/ND, +BS throughout Ext: no c/c/e Neuro: nonfocal      Assessment & Plan:  55 year old female known to me with a history of symptomatic hemorrhoids, intermittent constipation, GERD with dysphagia who is seen for symptomatic hemorrhoids.    Symptomatic internal hemorrhoids --bleeding and prolapse.  Treated previously but recurring in the left lateral position seen recently and anoscopy and before that a colonoscopy.   PROCEDURE NOTE:  The patient presents with symptomatic grade 2 internal hemorrhoids, requesting rubber band ligation of her hemorrhoidal disease.  All risks, benefits and alternative forms of therapy were described and informed consent was obtained.   The  anorectum was pre-medicated with 0.125% nitroglycerin ointment The decision was made to band the LL internal hemorrhoid, and the [place] was used to perform band ligation without complication.   Digital anorectal examination was then performed to assure proper positioning of the band, and to adjust the banded tissue as required.  The patient was discharged home without pain or other issues.  Dietary and behavioral recommendations were given and along with follow-up instructions.    The patient will return as scheduled for follow-up and possible additional banding as required. No complications were encountered and the patient tolerated the procedure well.  2.  Constipation and hard stool --exacerbating #1.  I encouraged her to use Colace 100 mg every day.  High-fiber diet with liberal fluid intake.

## 2021-03-03 ENCOUNTER — Encounter (INDEPENDENT_AMBULATORY_CARE_PROVIDER_SITE_OTHER): Payer: Self-pay

## 2021-03-03 NOTE — Progress Notes (Signed)
Chief Complaint:   OBESITY [name] [name] is here to discuss her progress with her obesity treatment plan along with follow-up of her obesity related diagnoses. [name] [name] is on keeping a food journal and adhering to recommended goals of 1200-1300 calories and 80 grams of protein daily and states she is following her eating plan approximately 90% of the time. [name] [name] states she is doing 0 minutes 0 times per week.  Today's visit was #: 9 Starting weight: 237 lbs Starting date: 08/26/2020 Today's weight: 225 lbs Today's date: 02/24/2021 Total lbs lost to date: 12 Total lbs lost since last in-office visit: 0  Interim History: [name] [name] has been very stressed and she has missed some dinner meals. She states that her insurance will not cover Saxenda. She feels that she needs help with controlling her appetite.  Subjective:   1. Impaired fasting glucose [name] [name] reports polyphagia, and her appetite is uncontrolled at times.  2. At risk for diabetes mellitus [name] [name] is at higher than average risk for developing diabetes due to obesity.   Assessment/Plan:   1. Impaired fasting glucose [name] [name] agreed to start Ozempic 0.25 mg with no refills. [name] [name] will continue to follow up as directed.  - Semaglutide,0.25 or 0.5MG /DOS, (OZEMPIC, 0.25 OR 0.5 MG/DOSE,) 2 MG/1.5ML SOPN; Inject 0.25 mg into the skin once a week.  Dispense: 1.5 mL; Refill: 0  2. At risk for diabetes mellitus [name] [name] was given approximately 15 minutes of diabetes education and counseling today. We discussed intensive lifestyle modifications today with an emphasis on weight loss as well as increasing exercise and decreasing simple carbohydrates in her diet. We also reviewed medication options with an emphasis on risk versus benefit of those discussed.   Repetitive spaced learning was employed today to elicit superior memory formation and behavioral change.  3. Class 2 severe obesity with serious comorbidity and body  mass index (BMI) of 38.0 to 38.09 in adult, unspecified obesity type ([name] [name]); current bmi 36.33 [name] [name] is currently in the action stage of change. As such, her goal is to continue with weight loss efforts. She has agreed to keeping a food journal and adhering to recommended goals of 1200-1300 calories and 80 grams of protein daily.   Exercise goals: No exercise has been prescribed at this time.  Behavioral modification strategies: meal planning and cooking strategies and keeping healthy foods in the home.  [name] [name] has agreed to follow-up with our clinic in 3 weeks. She was informed of the importance of frequent follow-up visits to maximize her success with intensive lifestyle modifications for her multiple health conditions.   Objective:   Blood pressure 124/81, pulse 81, temperature 98.1 F (36.7 C), height 5\' 6"  (1.676 m), weight 225 lb (102.1 kg), last menstrual period 09/13/2015, SpO2 100 %. Body mass index is 36.32 kg/m.  General: Cooperative, alert, well developed, in no acute distress. HEENT: Conjunctivae and lids unremarkable. Cardiovascular: Regular rhythm.  Lungs: Normal work of breathing. Neurologic: No focal deficits.   Lab Results  Component Value Date   CREATININE 0.85 08/26/2020   BUN 12 08/26/2020   NA 142 08/26/2020   K 4.2 08/26/2020   CL 102 08/26/2020   CO2 27 08/26/2020   Lab Results  Component Value Date   ALT 22 08/26/2020   AST 24 08/26/2020   ALKPHOS 104 08/26/2020   BILITOT 0.5 08/26/2020   Lab Results  Component Value Date   HGBA1C 5.4 08/26/2020   Lab Results  Component Value Date   INSULIN 20.0 08/26/2020  Lab Results  Component Value Date   TSH 1.440 08/26/2020   Lab Results  Component Value Date   CHOL 150 08/26/2020   HDL 53 08/26/2020   LDLCALC 84 08/26/2020   TRIG 64 08/26/2020   CHOLHDL 2.8 08/26/2020   Lab Results  Component Value Date   VD25OH 46.9 08/26/2020   Lab Results  Component Value Date   WBC 4.9  08/26/2020   HGB 13.5 08/26/2020   HCT 40.4 08/26/2020   MCV 92 08/26/2020   PLT 125 (L) 08/26/2020   Lab Results  Component Value Date   IRON 76 04/27/2009   FERRITIN 30.7 04/27/2009   Attestation Statements:   Reviewed by clinician on day of visit: allergies, medications, problem list, medical history, surgical history, family history, social history, and previous encounter notes.   Wilhemena Durie, am acting as transcriptionist for [employer], PA-C.  I have reviewed the above documentation for accuracy and completeness, and I agree with the above. Abby Potash, PA-C

## 2021-03-17 ENCOUNTER — Ambulatory Visit (INDEPENDENT_AMBULATORY_CARE_PROVIDER_SITE_OTHER): Payer: BC Managed Care – PPO | Admitting: Family Medicine

## 2021-03-17 ENCOUNTER — Encounter (INDEPENDENT_AMBULATORY_CARE_PROVIDER_SITE_OTHER): Payer: Self-pay | Admitting: Family Medicine

## 2021-03-17 ENCOUNTER — Other Ambulatory Visit: Payer: Self-pay

## 2021-03-17 VITALS — BP 138/84 | HR 77 | Temp 98.3°F | Ht 66.0 in | Wt 228.0 lb

## 2021-03-17 DIAGNOSIS — Z9189 Other specified personal risk factors, not elsewhere classified: Secondary | ICD-10-CM

## 2021-03-17 DIAGNOSIS — E8881 Metabolic syndrome: Secondary | ICD-10-CM

## 2021-03-17 DIAGNOSIS — Z6838 Body mass index (BMI) 38.0-38.9, adult: Secondary | ICD-10-CM | POA: Diagnosis not present

## 2021-03-17 MED ORDER — METFORMIN HCL 500 MG PO TABS: ORAL_TABLET | ORAL | 0 refills | Status: DC

## 2021-03-22 MED ORDER — CLOTRIMAZOLE-BETAMETHASONE 1-0.05 % EX CREA: 1.0000 "application " | TOPICAL_CREAM | Freq: Two times a day (BID) | CUTANEOUS | 0 refills | Status: AC

## 2021-03-22 NOTE — Telephone Encounter (Signed)
She can try Lotrisone cream BID (external perianal skin) for 5-7 days Not to be used daily for long-term Can you periodically for few days in the future PRN If not helpful she should let me know and I can recommend dietary modification for perianal itching Thanks JMP

## 2021-03-23 NOTE — Progress Notes (Signed)
Chief Complaint:   OBESITY [name] [name] is here to discuss her progress with her obesity treatment plan along with follow-up of her obesity related diagnoses. [name] [name] is on keeping a food journal and adhering to recommended goals of 1200-1300 calories and 80 grams of protein daily and states she is following her eating plan approximately 50% of the time. [name] [name] states she is walking for 30-40 minutes 3 times per week.  Today's visit was #: 10 Starting weight: 237 lbs Starting date: 08/26/2020 Today's weight: 228 lbs Today's date: 03/17/2021 Total lbs lost to date: 9 Total lbs lost since last in-office visit: 0  Interim History: [name] [name] went on vacations for 1 week and she ate off the plan foods. She is struggling to eat the right foods. She feels journaling her Category 2 foods helps her stick to it better.   Subjective:   1. Insulin resistance [name] [name]'s insurance would not cover Ozempic with impaired fasting glucose diagnosis or insulin resistance. She is asking for metformin again, and she feels it helped.  2. At risk for side effect of medication [place] Assessment/Plan:  No orders of the defined types were placed in this encounter.   Medications Discontinued During This Encounter  Medication Reason   Semaglutide,0.25 or 0.'5MG'$ /DOS, (OZEMPIC, 0.25 OR 0.5 MG/DOSE,) 2 MG/1.5ML SOPN Error     Meds ordered this encounter  Medications   metFORMIN (GLUCOPHAGE) 500 MG tablet    Sig: 1 po w/ lunch QD    Dispense:  30 tablet    Refill:  0    Ov for RF; 30 d supply     1. Insulin resistance [name] [name] agreed to restart metformin 250 mg for 4-6 days then 1 tablet PO q daily with lunch, with no refills. She will continue to work on weight loss, exercise, and decreasing simple carbohydrates to help decrease the risk of diabetes. [name] [name] agreed to follow-up with [name] as directed to closely monitor her progress.  - metFORMIN (GLUCOPHAGE) 500 MG tablet; 1 po w/ lunch QD   Dispense: 30 tablet; Refill: 0  2. At risk for side effect of medication [name] [name] was given approximately 9 minutes of drug side effect counseling today.  We discussed side effect possibility and risk versus benefits. [name] [name] agreed to the medication and will contact this office if these side effects are intolerable.  Repetitive spaced learning was employed today to elicit superior memory formation and behavioral change.  3. Obesity with current BMI of 36.8 [name] [name] is currently in the action stage of change. As such, her goal is to continue with weight loss efforts. She has agreed to the Category 2 Plan or keeping a food journal and adhering to recommended goals of 1200-1300 calories and 80+ grams of protein daily.   [name] [name] is to bring in her food log of calories and protein eaten.  Exercise goals: As is.  Behavioral modification strategies: increasing lean protein intake, decreasing simple carbohydrates, better snacking choices, and avoiding temptations.  [name] [name] has agreed to follow-up with our clinic in 3 weeks with Abby Potash, PA-C, and in 6 weeks with myself. She was informed of the importance of frequent follow-up visits to maximize her success with intensive lifestyle modifications for her multiple health conditions.   Objective:   Blood pressure 138/84, pulse 77, temperature 98.3 F (36.8 C), height '5\' 6"'$  (1.676 m), weight 228 lb (103.4 kg), last menstrual period 09/13/2015, SpO2 100 %. Body mass index is 36.8 kg/m.  General: Cooperative, alert, well developed, in no acute  distress. HEENT: Conjunctivae and lids unremarkable. Cardiovascular: Regular rhythm.  Lungs: Normal work of breathing. Neurologic: No focal deficits.   Lab Results  Component Value Date   CREATININE 0.85 08/26/2020   BUN 12 08/26/2020   NA 142 08/26/2020   K 4.2 08/26/2020   CL 102 08/26/2020   CO2 27 08/26/2020   Lab Results  Component Value Date   ALT 22 08/26/2020   AST 24  08/26/2020   ALKPHOS 104 08/26/2020   BILITOT 0.5 08/26/2020   Lab Results  Component Value Date   HGBA1C 5.4 08/26/2020   Lab Results  Component Value Date   INSULIN 20.0 08/26/2020   Lab Results  Component Value Date   TSH 1.440 08/26/2020   Lab Results  Component Value Date   CHOL 150 08/26/2020   HDL 53 08/26/2020   LDLCALC 84 08/26/2020   TRIG 64 08/26/2020   CHOLHDL 2.8 08/26/2020   Lab Results  Component Value Date   VD25OH 46.9 08/26/2020   Lab Results  Component Value Date   WBC 4.9 08/26/2020   HGB 13.5 08/26/2020   HCT 40.4 08/26/2020   MCV 92 08/26/2020   PLT 125 (L) 08/26/2020   Lab Results  Component Value Date   IRON 76 04/27/2009   FERRITIN 30.7 04/27/2009   Attestation Statements:   Reviewed by clinician on day of visit: allergies, medications, problem list, medical history, surgical history, family history, social history, and previous encounter notes.   Wilhemena Durie, am acting as transcriptionist for [employer], DO.  I have reviewed the above documentation for accuracy and completeness, and I agree with the above. Marjory Sneddon, D.O.  The [place] was signed into law in 2016 which includes the topic of electronic health records.  This provides immediate access to information in MyChart.  This includes consultation notes, operative notes, office notes, lab results and pathology reports.  If you have any questions about what you read please let us know at your next visit so we can discuss your concerns and take corrective action if need be.  We are right here with you.

## 2021-04-07 ENCOUNTER — Ambulatory Visit (INDEPENDENT_AMBULATORY_CARE_PROVIDER_SITE_OTHER): Payer: BC Managed Care – PPO | Admitting: Physician Assistant

## 2021-04-14 ENCOUNTER — Encounter (INDEPENDENT_AMBULATORY_CARE_PROVIDER_SITE_OTHER): Payer: Self-pay | Admitting: Family Medicine

## 2021-04-14 ENCOUNTER — Other Ambulatory Visit: Payer: Self-pay

## 2021-04-14 ENCOUNTER — Ambulatory Visit (INDEPENDENT_AMBULATORY_CARE_PROVIDER_SITE_OTHER): Payer: BC Managed Care – PPO | Admitting: Family Medicine

## 2021-04-14 VITALS — BP 152/83 | HR 88 | Ht 66.0 in | Wt 222.0 lb

## 2021-04-14 DIAGNOSIS — Z6838 Body mass index (BMI) 38.0-38.9, adult: Secondary | ICD-10-CM

## 2021-04-14 DIAGNOSIS — I10 Essential (primary) hypertension: Secondary | ICD-10-CM

## 2021-04-14 NOTE — Progress Notes (Signed)
Chief Complaint:   OBESITY [name] [name] is here to discuss her progress with her obesity treatment plan along with follow-up of her obesity related diagnoses. [name] [name] is on keeping a food journal and adhering to recommended goals of 1200-1300 calories and 80 grams of protein and states she is following her eating plan approximately 95% of the time. [name] [name] states she is walking for 30 minutes 5 times per week.  Today's visit was #: 11 Starting weight: 237 lbs Starting date: 08/26/2020 Today's weight: 222 lbs Today's date: 04/14/2021 Total lbs lost to date: 15 lbs Total lbs lost since last in-office visit: 6 lbs  Interim History: [name] [name] is journaling daily and meeting protein goals. Her appetite is well controlled with Metformin. She is very good with water intake. She denies intake of caloric beverages.  Subjective:   1. Essential hypertension Sharia's blood pressure is elevated today. She was upset at check in process today. She is on Norvasc 5 mg, Aldactone 50 mg and Losartan 50 mg. Her blood pressure usually well controlled.  BP Readings from Last 3 Encounters:  04/14/21 (!) 152/83  03/17/21 138/84  02/28/21 112/66     Assessment/Plan:   1. Essential hypertension [name] [name] will continue all medicines. Her blood pressure is elevated likely because she was upset.   2. Obesity with current BMI of 35.85 [name] [name] is currently in the action stage of change. As such, her goal is to continue with weight loss efforts. She has agreed to keeping a food journal and adhering to recommended goals of 1200-1300 calories and 80 grams of  protein daily. Exercise goals:  As is.  Behavioral modification strategies: planning for success and keeping a strict food journal.  [name] [name] has agreed to follow-up with our clinic in 3 weeks. alth conditions.   Objective:   Blood pressure (!) 152/83, pulse 88, height '5\' 6"'$  (1.676 m), weight 222 lb (100.7 kg), last menstrual period  09/13/2015, SpO2 99 %. Body mass index is 35.83 kg/m.  General: Cooperative, alert, well developed, in no acute distress. HEENT: Conjunctivae and lids unremarkable. Cardiovascular: Regular rhythm.  Lungs: Normal work of breathing. Neurologic: No focal deficits.   Lab Results  Component Value Date   CREATININE 0.85 08/26/2020   BUN 12 08/26/2020   NA 142 08/26/2020   K 4.2 08/26/2020   CL 102 08/26/2020   CO2 27 08/26/2020   Lab Results  Component Value Date   ALT 22 08/26/2020   AST 24 08/26/2020   ALKPHOS 104 08/26/2020   BILITOT 0.5 08/26/2020   Lab Results  Component Value Date   HGBA1C 5.4 08/26/2020   Lab Results  Component Value Date   INSULIN 20.0 08/26/2020   Lab Results  Component Value Date   TSH 1.440 08/26/2020   Lab Results  Component Value Date   CHOL 150 08/26/2020   HDL 53 08/26/2020   LDLCALC 84 08/26/2020   TRIG 64 08/26/2020   CHOLHDL 2.8 08/26/2020   Lab Results  Component Value Date   VD25OH 46.9 08/26/2020   Lab Results  Component Value Date   WBC 4.9 08/26/2020   HGB 13.5 08/26/2020   HCT 40.4 08/26/2020   MCV 92 08/26/2020   PLT 125 (L) 08/26/2020   Lab Results  Component Value Date   IRON 76 04/27/2009   FERRITIN 30.7 04/27/2009   Attestation Statements:   Reviewed by clinician on day of visit: allergies, medications, problem list, medical history, surgical history, family history, social history, and previous encounter  notes.   I, Lizbeth Bark, RMA, am acting as Location manager for [employer], [place].   I have reviewed the above documentation for accuracy and completeness, and I agree with the above. -  Georgianne Fick, FNP

## 2021-04-28 ENCOUNTER — Encounter (INDEPENDENT_AMBULATORY_CARE_PROVIDER_SITE_OTHER): Payer: Self-pay | Admitting: Family Medicine

## 2021-04-28 ENCOUNTER — Ambulatory Visit (INDEPENDENT_AMBULATORY_CARE_PROVIDER_SITE_OTHER): Payer: BC Managed Care – PPO | Admitting: Family Medicine

## 2021-04-28 ENCOUNTER — Other Ambulatory Visit: Payer: Self-pay

## 2021-04-28 VITALS — BP 139/84 | HR 79 | Temp 98.1°F | Ht 66.0 in | Wt 220.0 lb

## 2021-04-28 DIAGNOSIS — D6959 Other secondary thrombocytopenia: Secondary | ICD-10-CM

## 2021-04-28 DIAGNOSIS — Z6838 Body mass index (BMI) 38.0-38.9, adult: Secondary | ICD-10-CM

## 2021-04-28 DIAGNOSIS — E559 Vitamin D deficiency, unspecified: Secondary | ICD-10-CM | POA: Diagnosis not present

## 2021-04-28 DIAGNOSIS — E538 Deficiency of other specified B group vitamins: Secondary | ICD-10-CM

## 2021-04-28 DIAGNOSIS — Z9189 Other specified personal risk factors, not elsewhere classified: Secondary | ICD-10-CM | POA: Diagnosis not present

## 2021-04-28 DIAGNOSIS — E8881 Metabolic syndrome: Secondary | ICD-10-CM | POA: Diagnosis not present

## 2021-04-28 DIAGNOSIS — I10 Essential (primary) hypertension: Secondary | ICD-10-CM

## 2021-04-28 DIAGNOSIS — E7849 Other hyperlipidemia: Secondary | ICD-10-CM

## 2021-04-28 MED ORDER — METFORMIN HCL 500 MG PO TABS: ORAL_TABLET | ORAL | 0 refills | Status: DC

## 2021-04-28 NOTE — Progress Notes (Signed)
Chief Complaint:   OBESITY [name] [name] is here to discuss her progress with her obesity treatment plan along with follow-up of her obesity related diagnoses. [name] [name] is on keeping a food journal and adhering to recommended goals of 1200-1300 calories and 80 grams of protein daily and states she is following her eating plan approximately 80% of the time. [name] [name] states she is walking for 30 minutes 3-4 times per week.  Today's visit was #: 12 Starting weight: 237 lbs Starting date: 08/26/2020 Today's weight: 220 lbs Today's date: 04/28/2021 Total lbs lost to date: 17 Total lbs lost since last in-office visit: 2  Interim History: [name] [name] notes less cravings and less hunger on journaling plan and on metformin. She is using Fair life skim milk to enhance protein intake along with more chicken and fish. About 50% of the time she hits her nutritional goals, and she is eating more fruit as well.  Subjective:   1. Insulin resistance [name] [name] is on metformin, and she is tolerating medication(s) well without side effects. Medication compliance is good and patient appears to be taking it as prescribed.  Denies additional concerns regarding this condition.   2. Other hyperlipidemia [name] [name] is on Crestor, and she tolerating medication(s) well without side effects. Medication compliance is good and patient appears to be taking it as prescribed.  Denies additional concerns regarding this condition.   3. White coat syndrome with hypertension [name] [name] is on Aldactone, Norvasc, and Cozaar. She is tolerating medication(s) well without side effects. Medication compliance is good and patient appears to be taking it as prescribed.  Denies additional concerns regarding this condition.   4. Vitamin D deficiency [name] [name] is currently taking OTC vitamin D 2,000 IU each day and multivitamins. She denies nausea, vomiting or muscle weakness.  5. B12  deficiency [name] [name] gets B12 injections once monthly. She has no concerns.   6. Drug-induced low platelet count [name] [name] is status post chemo/radiation for breast cancer.  7. At risk for diabetes mellitus [name] [name] is at higher than average risk for developing diabetes due to insulin resistance.   Assessment/Plan:   Orders Placed This Encounter  Procedures   Comprehensive metabolic panel   CBC with Differential/Platelet   Vitamin B12   VITAMIN D 25 Hydroxy (Vit-D Deficiency, Fractures)   Lipid panel   Hemoglobin A1c   Insulin, random    Medications Discontinued During This Encounter  Medication Reason   metFORMIN (GLUCOPHAGE) 500 MG tablet Reorder     Meds ordered this encounter  Medications   metFORMIN (GLUCOPHAGE) 500 MG tablet    Sig: 1 po w/ lunch QD    Dispense:  30 tablet    Refill:  0    Ov for RF; 30 d supply     1. Insulin resistance We will check labs today. [name] [name] will continue metformin at 500 mg q daily, and we will refill for 1 month.  - I reiterated and again counseled patient on pathophysiology of the disease process of I.R.  - Stressed importance of dietary and lifestyle modifications resulting in weight loss as first line txmnt - in addition we discussed the risks and benefits of various medication options which can help [name] in the management of this disease process as well as with weight loss.  Will consider starting one of these meds in future as will focus on prudent nutritional plan at this time.  - continue to decrease simple carbs; increase fiber and proteins -> follow meal  plan  - handouts provided at pt's request after education provided.  All concerns/questions addressed.   - anticipatory guidance given.   - Recheck A1c and fasting insulin level in approximately 3 months from last check or as deemed fit.   - metFORMIN (GLUCOPHAGE) 500 MG tablet; 1 po w/ lunch QD  Dispense: 30 tablet; Refill: 0 - Hemoglobin A1c - Insulin, random  2.  Other hyperlipidemia We will check labs today, and will follow up at [name] [name]'s next office visit.  [name] [name] reports compliance with meds and/or treatment plan such as low saturated and trans fat low cholesterol meal plan - Rec: aerobic activity with eventual goal of a minimum of 150+ min wk plus 2 days/ week of resistance strength training - Cardiovascular risk and specific lipid/LDL goals reviewed.  We discussed several lifestyle modifications today and [name] [name] will continue to work on diet, exercise and weight loss efforts.  - Will continue routine screening as patient continues with health goals and weight loss journey  - Lipid panel  3. White coat syndrome with hypertension [name] [name]'s blood pressure is at goal. We will check labs today.  - Counseled [name] [name] on pathophysiology of disease and discussed treatment plan, which always includes dietary and lifestyle modification as first line.  - Lifestyle changes such as following our low salt, heart healthy meal plan and engaging in a regular exercise program discussed  - Avoid buying foods that are: processed, frozen, or prepackaged to avoid excess salt. - Ambulatory blood pressure monitoring encouraged.  Reminded patient that if they ever feel poorly in any way, to check their blood pressure and pulse as well. - We will continue to monitor closely alongside PCP/ specialists.  Pt reminded to also f/up with those individuals as instructed by them.  - We will continue to monitor symptoms as they relate to the her weight loss journey.  - Comprehensive metabolic panel  4. Vitamin D deficiency We will check labs today, and will follow up at [name] [name]'s next office visit.  - Discussed importance of vitamin D to their health and well-being.  - possible symptoms of low Vitamin D can be low energy, depressed mood, muscle aches, joint aches, osteoporosis etc. - low Vitamin D levels may be linked to an increased  risk of cardiovascular events and even increased risk of cancers- such as colon and breast.  - I recommend pt take a 2,000 IU OTC Vit D daily. - Informed patient this may be a lifelong thing, and she was encouraged to continue to take the medicine until told otherwise.   - we will need to monitor levels regularly (every 3-4 mo on average) to keep levels within normal limits.  - weight loss will likely improve availability of vitamin D, thus encouraged [name] [name] to continue with meal plan and their weight loss efforts to further improve this condition - pt's questions and concerns regarding this condition addressed.  - VITAMIN D 25 Hydroxy (Vit-D Deficiency, Fractures)  5. B12 deficiency We will check labs today, and will follow up at [name] [name]'s next office visit.  - Discussed importance of vitamin D to their health and well-being.  - possible symptoms of low B12 can be low energy, depressed mood, muscle aches, joint aches, osteoporosis etc. - low B12 levels may be linked to an increased risk of cardiovascular events and even increased risk of cancers- such as colon and breast.  - Informed patient this may be a lifelong thing, and she was encouraged to continue to  take the medicine until told otherwise.   - we will need to monitor levels regularly (every 3-4 mo on average) to keep levels within normal limits.  - weight loss will likely improve availability of vitamin D, thus encouraged [name] [name] to continue with meal plan and their weight loss efforts to further improve this condition - pt's questions and concerns regarding this condition addressed.  - Vitamin B12  6. Drug-induced low platelet count We will check CBC today for stability, and will follow up at [facility] next office visit.  - CBC with Differential/Platelet  7. At risk for diabetes mellitus - [name] [name] was given diabetes prevention education and counseling today of more than 9 minutes.  - Counseled patient on  pathophysiology of disease and meaning/ implication of lab results.  - Reviewed how certain foods can either stimulate or inhibit insulin release, and subsequently affect hunger pathways  - Importance of following a healthy meal plan with limiting amounts of simple carbohydrates discussed with patient - Effects of regular aerobic exercise on blood sugar regulation reviewed and encouraged an eventual goal of 30 min 5d/week or more as a minimum.  - Briefly discussed treatment options, which always include dietary and lifestyle modification as first line.   - Handouts provided at patient's desire and/or told to go online to the American Diabetes Association website for further information.  8. Obesity with current BMI of 35.6 [name] [name] is currently in the action stage of change. As such, her goal is to continue with weight loss efforts. She has agreed to keeping a food journal and adhering to recommended goals of 1200-1300 calories and 80 grams of protein daily.   Exercise goals: For substantial health benefits, adults should do at least 150 minutes (2 hours and 30 minutes) a week of moderate-intensity, or 75 minutes (1 hour and 15 minutes) a week of vigorous-intensity aerobic physical activity, or an equivalent combination of moderate- and vigorous-intensity aerobic activity. Aerobic activity should be performed in episodes of at least 10 minutes, and preferably, it should be spread throughout the week.  [name] [name] will use nutritional journaling log and bring in to her next office visit.  Behavioral modification strategies: keeping a strict food journal.  [name] [name] has agreed to follow-up with our clinic in 3 weeks. She was informed of the importance of frequent follow-up visits to maximize her success with intensive lifestyle modifications for her multiple health conditions.   [name] [name] was informed we would discuss her lab results at her next visit unless there is a critical issue that needs to be  addressed sooner. [name] [name] agreed to keep her next visit at the agreed upon time to discuss these results.  Objective:   Blood pressure 139/84, pulse 79, temperature 98.1 F (36.7 C), height '5\' 6"'$  (1.676 m), weight 220 lb (99.8 kg), last menstrual period 09/13/2015, SpO2 100 %. Body mass index is 35.51 kg/m.  General: Cooperative, alert, well developed, in no acute distress. HEENT: Conjunctivae and lids unremarkable. Cardiovascular: Regular rhythm.  Lungs: Normal work of breathing. Neurologic: No focal deficits.   Lab Results  Component Value Date   CREATININE 0.85 08/26/2020   BUN 12 08/26/2020   NA 142 08/26/2020   K 4.2 08/26/2020   CL 102 08/26/2020   CO2 27 08/26/2020   Lab Results  Component Value Date   ALT 22 08/26/2020   AST 24 08/26/2020   ALKPHOS 104 08/26/2020   BILITOT 0.5 08/26/2020   Lab Results  Component Value Date   HGBA1C 5.4 08/26/2020  Lab Results  Component Value Date   INSULIN 20.0 08/26/2020   Lab Results  Component Value Date   TSH 1.440 08/26/2020   Lab Results  Component Value Date   CHOL 150 08/26/2020   HDL 53 08/26/2020   LDLCALC 84 08/26/2020   TRIG 64 08/26/2020   CHOLHDL 2.8 08/26/2020   Lab Results  Component Value Date   VD25OH 46.9 08/26/2020   Lab Results  Component Value Date   WBC 4.9 08/26/2020   HGB 13.5 08/26/2020   HCT 40.4 08/26/2020   MCV 92 08/26/2020   PLT 125 (L) 08/26/2020   Lab Results  Component Value Date   IRON 76 04/27/2009   FERRITIN 30.7 04/27/2009   Attestation Statements:   Reviewed by clinician on day of visit: allergies, medications, problem list, medical history, surgical history, family history, social history, and previous encounter notes.   Wilhemena Durie, am acting as transcriptionist for [employer], DO.  I have reviewed the above documentation for accuracy and completeness, and I agree with the above. Marjory Sneddon, D.O.  The [place] was signed  into law in 2016 which includes the topic of electronic health records.  This provides immediate access to information in MyChart.  This includes consultation notes, operative notes, office notes, lab results and pathology reports.  If you have any questions about what you read please let us know at your next visit so we can discuss your concerns and take corrective action if need be.  We are right here with you.

## 2021-04-29 ENCOUNTER — Other Ambulatory Visit: Payer: Self-pay | Admitting: Obstetrics and Gynecology

## 2021-04-29 LAB — COMPREHENSIVE METABOLIC PANEL
ALT: 12 IU/L (ref 0–32)
AST: 17 IU/L (ref 0–40)
Albumin/Globulin Ratio: 2 (ref 1.2–2.2)
Albumin: 4.7 g/dL (ref 3.8–4.9)
Alkaline Phosphatase: 83 IU/L (ref 44–121)
BUN/Creatinine Ratio: 11 (ref 9–23)
BUN: 11 mg/dL (ref 6–24)
Bilirubin Total: 0.6 mg/dL (ref 0.0–1.2)
CO2: 25 mmol/L (ref 20–29)
Calcium: 9.8 mg/dL (ref 8.7–10.2)
Chloride: 103 mmol/L (ref 96–106)
Creatinine, Ser: 0.97 mg/dL (ref 0.57–1.00)
Globulin, Total: 2.3 g/dL (ref 1.5–4.5)
Glucose: 84 mg/dL (ref 65–99)
Potassium: 4.2 mmol/L (ref 3.5–5.2)
Sodium: 140 mmol/L (ref 134–144)
Total Protein: 7 g/dL (ref 6.0–8.5)
eGFR: 68 mL/min/{1.73_m2} (ref >59)

## 2021-04-29 LAB — CBC WITH DIFFERENTIAL/PLATELET
Basophils Absolute: 0 10*3/uL (ref 0.0–0.2)
Basos: 0 %
EOS (ABSOLUTE): 0.1 10*3/uL (ref 0.0–0.4)
Eos: 1 %
Hematocrit: 40.1 % (ref 34.0–46.6)
Hemoglobin: 12.9 g/dL (ref 11.1–15.9)
Immature Grans (Abs): 0 10*3/uL (ref 0.0–0.1)
Immature Granulocytes: 0 %
Lymphocytes Absolute: 1.2 10*3/uL (ref 0.7–3.1)
Lymphs: 26 %
MCH: 30.1 pg (ref 26.6–33.0)
MCHC: 32.2 g/dL (ref 31.5–35.7)
MCV: 94 fL (ref 79–97)
Monocytes Absolute: 0.3 10*3/uL (ref 0.1–0.9)
Monocytes: 7 %
Neutrophils Absolute: 3 10*3/uL (ref 1.4–7.0)
Neutrophils: 66 %
Platelets: 129 10*3/uL — ABNORMAL LOW (ref 150–450)
RBC: 4.29 x10E6/uL (ref 3.77–5.28)
RDW: 12.8 % (ref 11.7–15.4)
WBC: 4.6 10*3/uL (ref 3.4–10.8)

## 2021-04-29 LAB — LIPID PANEL
Chol/HDL Ratio: 3.2 ratio (ref 0.0–4.4)
Cholesterol, Total: 137 mg/dL (ref 100–199)
HDL: 43 mg/dL (ref >39)
LDL Chol Calc (NIH): 79 mg/dL (ref 0–99)
Triglycerides: 77 mg/dL (ref 0–149)
VLDL Cholesterol Cal: 15 mg/dL (ref 5–40)

## 2021-04-29 LAB — INSULIN, RANDOM: INSULIN: 21 u[IU]/mL (ref 2.6–24.9)

## 2021-04-29 LAB — VITAMIN D 25 HYDROXY (VIT D DEFICIENCY, FRACTURES): Vit D, 25-Hydroxy: 70.4 ng/mL (ref 30.0–100.0)

## 2021-04-29 LAB — HEMOGLOBIN A1C
Est. average glucose Bld gHb Est-mCnc: 108 mg/dL
Hgb A1c MFr Bld: 5.4 % (ref 4.8–5.6)

## 2021-04-29 LAB — VITAMIN B12: Vitamin B-12: 638 pg/mL (ref 232–1245)

## 2021-05-19 ENCOUNTER — Encounter (INDEPENDENT_AMBULATORY_CARE_PROVIDER_SITE_OTHER): Payer: Self-pay | Admitting: Family Medicine

## 2021-05-19 ENCOUNTER — Other Ambulatory Visit: Payer: Self-pay

## 2021-05-19 ENCOUNTER — Ambulatory Visit (INDEPENDENT_AMBULATORY_CARE_PROVIDER_SITE_OTHER): Payer: BC Managed Care – PPO | Admitting: Family Medicine

## 2021-05-19 VITALS — BP 130/79 | HR 94 | Temp 98.4°F | Ht 66.0 in | Wt 220.0 lb

## 2021-05-19 DIAGNOSIS — D696 Thrombocytopenia, unspecified: Secondary | ICD-10-CM | POA: Diagnosis not present

## 2021-05-19 DIAGNOSIS — E559 Vitamin D deficiency, unspecified: Secondary | ICD-10-CM

## 2021-05-19 DIAGNOSIS — E88819 Insulin resistance, unspecified: Secondary | ICD-10-CM

## 2021-05-19 DIAGNOSIS — E8881 Metabolic syndrome: Secondary | ICD-10-CM

## 2021-05-19 DIAGNOSIS — Z6838 Body mass index (BMI) 38.0-38.9, adult: Secondary | ICD-10-CM

## 2021-05-19 MED ORDER — VITAMIN D 50 MCG (2000 UT) PO CAPS: ORAL_CAPSULE | ORAL | 0 refills | Status: DC

## 2021-05-19 NOTE — Progress Notes (Signed)
Chief Complaint:   OBESITY [name] [name] is here to discuss her progress with her obesity treatment plan along with follow-up of her obesity related diagnoses. [name] [name] is on keeping a food journal and adhering to recommended goals of 1200-1300 calories and 80 grams of protein and states she is following her eating plan approximately 80% of the time. [name] [name] states she is walking for 60 minutes 4 times per week.  Today's visit was #: 87 Starting weight: 237 lbs Starting date: 08/26/2020 Today's weight: 220 lbs Today's date: 05/19/2021 Total lbs lost to date: 17 lbs Total lbs lost since last in-office visit: 0  Interim History: Alton's appetite is well controlled. She is getting in her protein. She has an Atkins shake daily at breakfast. She feels she is eating adequate protein.  Subjective:   1. Vitamin D deficiency [name] [name]'s Vitamin D is at goal. Her level has increased on 2000 IU daily.   Lab Results  Component Value Date   VD25OH 70.4 04/28/2021   VD25OH 46.9 08/26/2020    2. Insulin resistance [name] [name] is on Metformin and tolerating it well. Her appetite is well controlled. She denies nausea and diarrhea. She does note flatulence.   Lab Results  Component Value Date   INSULIN 21.0 04/28/2021   INSULIN 20.0 08/26/2020   Lab Results  Component Value Date   HGBA1C 5.4 04/28/2021   3. Thrombocytopenia [name] [name] has had a low platelet cont consistently since she had chemo in 2020. Dr. Lindi Adie is aware. Platelets 150 - 450 x10E3/uL 129 Low   125 Low   134 Low  R  145 Low  R  137.0 Low  R  133 Low  R  173 R     Assessment/Plan:   1. Vitamin D deficiency [name] [name] agrees to alternate Vitamin D 2,000 IU and 1,000 daily and she will follow-up for routine testing of Vitamin D, at least 2-3 times per year to avoid over-replacement.  - Cholecalciferol (VITAMIN D) 50 MCG (2000 UT) CAPS; Alternate 2000 IU and 1000 IU every other day.  Dispense: 30 capsule; Refill:  0  2. Insulin resistance [name] [name] will continue Metformin. She will continue to work on weight loss, exercise, and decreasing simple carbohydrates to help decrease the risk of diabetes. [name] [name] agreed to follow-up with [name] as directed to closely monitor her progress.  3. Thrombocytopenia She will follow up with Dr. Lindi Adie in February 2023.  4. Obesity with current BMI of 35.53 [name] [name] is currently in the action stage of change. As such, her goal is to continue with weight loss efforts. She has agreed to keeping a food journal and adhering to recommended goals of 1200-1300 calories and 80 grams of protein.   [name] [name] may have protein oatmeal at breakfast with 1 cup of Fair Life Milk.  Exercise goals:  [name] [name] will add resistance training 2 times per week.  Behavioral modification strategies: meal planning and cooking strategies.  [name] [name] has agreed to follow-up with our clinic in 3 weeks.  Objective:   Blood pressure 130/79, pulse 94, temperature 98.4 F (36.9 C), height 5\' 6"  (1.676 m), weight 220 lb (99.8 kg), last menstrual period 09/13/2015, SpO2 99 %. Body mass index is 35.51 kg/m.  General: Cooperative, alert, well developed, in no acute distress. HEENT: Conjunctivae and lids unremarkable. Cardiovascular: Regular rhythm.  Lungs: Normal work of breathing. Neurologic: No focal deficits.   Lab Results  Component Value Date   CREATININE 0.97 04/28/2021   BUN 11 04/28/2021   NA 140  04/28/2021   K 4.2 04/28/2021   CL 103 04/28/2021   CO2 25 04/28/2021   Lab Results  Component Value Date   ALT 12 04/28/2021   AST 17 04/28/2021   ALKPHOS 83 04/28/2021   BILITOT 0.6 04/28/2021   Lab Results  Component Value Date   HGBA1C 5.4 04/28/2021   HGBA1C 5.4 08/26/2020   Lab Results  Component Value Date   INSULIN 21.0 04/28/2021   INSULIN 20.0 08/26/2020   Lab Results  Component Value Date   TSH 1.440 08/26/2020   Lab Results  Component Value Date   CHOL 137  04/28/2021   HDL 43 04/28/2021   LDLCALC 79 04/28/2021   TRIG 77 04/28/2021   CHOLHDL 3.2 04/28/2021   Lab Results  Component Value Date   VD25OH 70.4 04/28/2021   VD25OH 46.9 08/26/2020   Lab Results  Component Value Date   WBC 4.6 04/28/2021   HGB 12.9 04/28/2021   HCT 40.1 04/28/2021   MCV 94 04/28/2021   PLT 129 (L) 04/28/2021   Lab Results  Component Value Date   IRON 76 04/27/2009   FERRITIN 30.7 04/27/2009   Attestation Statements:   Reviewed by clinician on day of visit: allergies, medications, problem list, medical history, surgical history, family history, social history, and previous encounter notes.  Time spent on visit including pre-visit chart review and post-visit care and charting was 31 minutes.   I, Lizbeth Bark, RMA, am acting as Location manager for [employer], [place].   I have reviewed the above documentation for accuracy and completeness, and I agree with the above. -  Georgianne Fick, FNP

## 2021-05-29 ENCOUNTER — Ambulatory Visit
Admission: EM | Admit: 2021-05-29 | Discharge: 2021-05-29 | Disposition: A | Discharge status: Home / Self Care | Payer: BC Managed Care – PPO | Attending: Internal Medicine | Admitting: Internal Medicine

## 2021-05-29 ENCOUNTER — Other Ambulatory Visit: Payer: Self-pay

## 2021-05-29 DIAGNOSIS — L290 Pruritus ani: Secondary | ICD-10-CM

## 2021-05-29 DIAGNOSIS — R109 Unspecified abdominal pain: Secondary | ICD-10-CM | POA: Diagnosis not present

## 2021-05-29 LAB — POCT URINALYSIS DIP (MANUAL ENTRY)
Bilirubin, UA: NEGATIVE
Blood, UA: NEGATIVE
Glucose, UA: NEGATIVE mg/dL
Ketones, POC UA: NEGATIVE mg/dL
Leukocytes, UA: NEGATIVE
Nitrite, UA: NEGATIVE
Protein Ur, POC: NEGATIVE mg/dL
Spec Grav, UA: >=1.03 — AB (ref 1.010–1.025)
Urobilinogen, UA: 0.2 E.U./dL
pH, UA: 5 (ref 5.0–8.0)

## 2021-05-29 MED ORDER — HYDROCORTISONE (PERIANAL) 2.5 % EX CREA: 1.0000 "application " | TOPICAL_CREAM | Freq: Every day | CUTANEOUS | 0 refills | Status: DC

## 2021-05-29 NOTE — ED Provider Notes (Signed)
[facility]    CSN: [id number] Arrival date & time: 05/29/21  0843      History   Chief Complaint Chief Complaint  Patient presents with   right flank pain    HPI [name] [name] is a 55 y.o. female.   Patient presents with right flank pain that radiates around to abdomen that started yesterday.  Pain is rated 9/10 on pain scale and is constant pain.  Described as a sharp pain.  Denies any apparent injury to the back.  Pain is exacerbated by movement.  Patient does have history of kidney stones approximately a few years ago.  Denies chest pain or shortness of breath.  Denies any fevers.  Patient also reports anal itching.  Patient had internal hemorrhoid removed by band ligation on 02/28/2021.  Itching has been present since this procedure was completed.  Patient advised GI doctor that itching was occurring on 03/22/2021 and received Lotrisone cream to help alleviate itching.  Patient reports that cream has not provided any relief.  Patient denies any urinary burning, urinary frequency, vaginal discharge, hematuria, irregular vaginal bleeding, fever.     Past Medical History:  Diagnosis Date   Allergy    seasonal   Anal fissure    Back pain    Breast cancer ([place]) 2019   R BREAST IDC   Complication of anesthesia    Constipation    Diverticulitis    Fatty liver    GERD (gastroesophageal reflux disease) 07/07/2005   Heartburn    Hemorrhoid    Hepatic steatosis    Hypertension    Internal hemorrhoids    Joint pain    Malignant neoplasm of lower-inner quadrant of right breast of female, estrogen receptor positive ([place]) 07/16/2018   Personal history of chemotherapy    Personal history of radiation therapy    PONV (postoperative nausea and vomiting)    Positive H. pylori test    SOBOE (shortness of breath on exertion)     Patient Active Problem List   Diagnosis Date Noted   Insulin resistance 10/21/2020   Gastroesophageal reflux disease 10/21/2020    White coat syndrome with hypertension 10/21/2020   At risk for diabetes mellitus 10/21/2020   Constipation 09/30/2020   SOBOE (shortness of breath on exertion) 08/26/2020   Vitamin D deficiency 08/26/2020   Other fatigue 08/26/2020   Malignant neoplasm of right female breast ([place]) 08/26/2020   At risk for impaired metabolic function 10/62/6948   Elevated coronary artery calcium score 07/14/2020   Mixed hyperlipidemia 07/14/2020   Atypical chest pain 07/08/2020   Chronic fatigue 07/07/2020   Left arm pain 12/05/2019   Leg edema 05/31/2019   Port-A-Cath in place 09/25/2018   Malignant neoplasm of lower-inner quadrant of right breast of female, estrogen receptor positive ([place]) 07/16/2018   Screening cholesterol level 07/10/2017   Anal fissure 07/07/2014   Pruritic rash 06/16/2014   Class 2 severe obesity with serious comorbidity and body mass index (BMI) of 38.0 to 38.9 in adult ([place]) 06/16/2014   Primary localized osteoarthrosis, lower leg 11/18/2013   Bilateral knee pain 09/08/2013   VITAMIN B12 DEFICIENCY 02/10/2010   Essential hypertension 08/16/2007   ALLERGIC RHINITIS 08/16/2007   GERD 08/16/2007   Right upper quadrant pain 05/13/2007    Past Surgical History:  Procedure Laterality Date   BREAST BIOPSY Right 07/12/2018   R BREAST IDC   BREAST LUMPECTOMY Right 08/22/2018   IDC   BREAST LUMPECTOMY WITH RADIOACTIVE SEED AND SENTINEL LYMPH NODE  BIOPSY Right 08/23/2018   Procedure: RIGHT BREAST LUMPECTOMY WITH RADIOACTIVE SEED AND RIGHT SENTINEL LYMPH NODE MAPPING;  Surgeon: Erroll Luna, MD;  Location: [place];  Service: General;  Laterality: Right;   COLON RESECTION  2000's Dr. Hassell Done   Diverticulitis.   COLONOSCOPY     FOOT SURGERY Left    g3p2 c-section1     KNEE SURGERY Right    PORT-A-CATH REMOVAL Right 04/15/2019   Procedure: REMOVAL PORT-A-CATH;  Surgeon: Erroll Luna, MD;  Location: [place];  Service: General;  Laterality: Right;  MAC AND LOCAL  ANESTHETIC   PORTACATH PLACEMENT Right 09/24/2018   Procedure: INSERTION PORT-A-CATH WITH ULTRASOUND;  Surgeon: Erroll Luna, MD;  Location: [place];  Service: General;  Laterality: Right;   TONSILLECTOMY     TUBAL LIGATION      OB History     Gravida  3   Para  3   Term      Preterm      AB      Living         SAB      IAB      Ectopic      Multiple      Live Births               Home Medications    Prior to Admission medications   Medication Sig Start Date End Date Taking? Authorizing Provider  hydrocortisone (PROCTOSOL HC) 2.5 % rectal cream Place 1 application rectally daily. 05/29/21  Yes Odis Luster, FNP  amLODipine (NORVASC) 5 MG tablet Take 5 mg by mouth daily.    [provider]  Cholecalciferol (VITAMIN D) 50 MCG (2000 UT) CAPS Alternate 2000 IU and 1000 IU every other day. 05/19/21   Whitmire, Joneen Boers, FNP  clobetasol ointment (TEMOVATE) 8.89 % Apply 1 application topically 2 (two) times daily. Then as needed for 30 days 07/03/18   [provider]  clotrimazole-betamethasone (LOTRISONE) cream Apply 1 application topically 2 (two) times daily. For 5-7 days 03/22/21   Jerene Bears, MD  exemestane (AROMASIN) 25 MG tablet Take 1 tablet (25 mg total) by mouth daily after breakfast. 10/18/20   Nicholas Lose, MD  losartan (COZAAR) 50 MG tablet Take 50 mg by mouth daily.    [provider]  metFORMIN (GLUCOPHAGE) 500 MG tablet 1 po w/ lunch QD 04/28/21   Opalski, Deborah, DO  Multiple Vitamins-Minerals (MULTIVITAMIN WITH MINERALS) tablet Take 1 tablet by mouth daily.    [provider]  naproxen (NAPROSYN) 500 MG tablet Take 1 tablet (500 mg total) by mouth 2 (two) times daily as needed for headache. 11/11/20   Pearson Forster, NP  pantoprazole (PROTONIX) 40 MG tablet Take 1 tablet (40 mg total) by mouth daily. 01/12/21   Esterwood, Amy S, PA-C  Probiotic Product (PRO-BIOTIC BLEND PO) Take 1 capsule by mouth  daily.    [provider]  rosuvastatin (CRESTOR) 10 MG tablet TAKE 1 TABLET(10 MG) BY MOUTH DAILY 02/08/21   Patwardhan, Manish J, MD  spironolactone (ALDACTONE) 50 MG tablet TAKE 1 TABLET(50 MG) BY MOUTH DAILY Patient taking differently: Take 50 mg by mouth daily. 07/28/19   Patwardhan, Reynold Bowen, MD  tiZANidine (ZANAFLEX) 4 MG tablet Take 1 tablet (4 mg total) by mouth every 6 (six) hours as needed for muscle spasms. 11/11/20   Pearson Forster, NP    Family History Family History  Problem Relation Age of Onset  Hypertension Mother    Breast cancer Mother 76   Cancer Mother    Alcoholism Mother    Drug abuse Mother    Hypertension Father    Heart disease Father    Heart attack Father    Hypertension Brother    Diabetes Neg Hx    COPD Neg Hx    Colon cancer Neg Hx    Stomach cancer Neg Hx    Pancreatic cancer Neg Hx    Esophageal cancer Neg Hx    Rectal cancer Neg Hx     Social History Social History   Tobacco Use   Smoking status: Never   Smokeless tobacco: Never  Vaping Use   Vaping Use: Never used  Substance Use Topics   Alcohol use: No    Alcohol/week: 0.0 standard drinks   Drug use: No     Allergies   Ibuprofen, Lisinopril, Dilaudid [hydromorphone hcl], and Tape   Review of Systems Review of Systems Per HPI  Physical Exam Triage Vital Signs ED Triage Vitals [05/29/21 0941]  Enc Vitals Group     BP (!) 156/82     Pulse Rate 84     Resp 18     Temp 98.2 F (36.8 C)     Temp Source Oral     SpO2 98 %     Weight      Height      Head Circumference      Peak Flow      Pain Score 9     Pain Loc      Pain Edu?      Excl. in [place]?    No data found.  Updated Vital Signs BP (!) 156/82 (BP Location: Left Arm)   Pulse 84   Temp 98.2 F (36.8 C) (Oral)   Resp 18   LMP 09/13/2015 Comment: celebate, BTL  SpO2 98%   Visual Acuity Right Eye Distance:   Left Eye Distance:   Bilateral Distance:    Right Eye Near:   Left Eye Near:     Bilateral Near:     Physical Exam Exam conducted with a chaperone present.  Constitutional:      General: She is not in acute distress.    Appearance: Normal appearance. She is not toxic-appearing or diaphoretic.  HENT:     Head: Normocephalic and atraumatic.  Eyes:     Extraocular Movements: Extraocular movements intact.     Conjunctiva/sclera: Conjunctivae normal.  Cardiovascular:     Rate and Rhythm: Normal rate and regular rhythm.     Pulses: Normal pulses.     Heart sounds: Normal heart sounds.  Pulmonary:     Effort: Pulmonary effort is normal. No respiratory distress.     Breath sounds: Normal breath sounds.  Abdominal:     General: Bowel sounds are normal. There is no distension.     Palpations: Abdomen is soft.     Tenderness: There is no abdominal tenderness.  Genitourinary:    Pubic Area: No rash.      Labia:        Right: No rash, tenderness, lesion or injury.        Left: No rash, tenderness, lesion or injury.      Vagina: Normal.     Rectum: Normal. Guaiac result negative. No mass, tenderness, anal fissure, external hemorrhoid or internal hemorrhoid. Normal anal tone.  Musculoskeletal:     Cervical back: Normal.     Thoracic back: Normal.  Lumbar back: Tenderness present. No swelling, edema or bony tenderness. Negative right straight leg raise test and negative left straight leg raise test.       Back:     Comments: Tenderness to palpation to right lower lumbar region that radiates around to flank and abdomen.  Skin:    General: Skin is warm and dry.  Neurological:     General: No focal deficit present.     Mental Status: She is alert and oriented to person, place, and time. Mental status is at baseline.     Deep Tendon Reflexes: Reflexes are normal and symmetric.  Psychiatric:        Mood and Affect: Mood normal.        Behavior: Behavior normal.        Thought Content: Thought content normal.        Judgment: Judgment normal.     UC Treatments /  Results  Labs (all labs ordered are listed, but only abnormal results are displayed) Labs Reviewed  POCT URINALYSIS DIP (MANUAL ENTRY) - Abnormal; Notable for the following components:      Result Value   Spec Grav, UA >=1.030 (*)    All other components within normal limits    EKG   Radiology No results found.  Procedures Procedures (including critical care time)  Medications Ordered in UC Medications - No data to display  Initial Impression / Assessment and Plan / UC Course  I have reviewed the triage vital signs and the nursing notes.  Pertinent labs & imaging results that were available during my care of the patient were reviewed by me and considered in my medical decision making (see chart for details).     Urinalysis did not show signs of urinary tract infection or hematuria related to kidney stone.  Differential diagnoses include kidney stone or musculoskeletal pain.  Although do think that this pain is kidney stone related due to intensity of pain, mechanism of pain, and patient's history of kidney stones.  Pain is reproducible with palpation, although low suspicion for musculoskeletal pain.  Advised patient to go to the hospital for further evaluation and management due to severity of pain.  Patient was agreeable with plan.  Although, patient stated at end of the visit that "she would either go to the ER or follow-up with PCP as soon as possible".  Patient was advised that going to the ER was the best option at this time.  Patient voiced understanding.  Will prescribe Proctosol cream to help alleviate anal itching.  No abnormality seen on physical exam that could indicate other worrisome etiologies.  Patient advised to follow-up with GI specialist soon as possible for further evaluation management.  Final Clinical Impressions(s) / UC Diagnoses   Final diagnoses:  Anal itching  Right flank pain     Discharge Instructions      It is important that you go to the  hospital for further evaluation and management of right flank pain.  It could possibly be a kidney stone.  You have been prescribed itching medication for your rectum.  Please follow-up with your gastrointestinal doctor for further evaluation and management.     ED Prescriptions     Medication Sig Dispense Auth. Provider   hydrocortisone (PROCTOSOL HC) 2.5 % rectal cream Place 1 application rectally daily. 30 g Odis Luster, FNP      PDMP not reviewed this encounter.   Odis Luster, FNP 05/29/21 1100

## 2021-05-29 NOTE — Discharge Instructions (Addendum)
It is important that you go to the hospital for further evaluation and management of right flank pain.  It could possibly be a kidney stone.  You have been prescribed itching medication for your rectum.  Please follow-up with your gastrointestinal doctor for further evaluation and management.

## 2021-05-29 NOTE — ED Triage Notes (Signed)
Pt c/o pain to right flank onset yesterday described 9/10 throbbing achy pain. Also rectal itching "from the back to the front," onset over a week ago. States she has had hemorrhoids removed before.

## 2021-06-09 ENCOUNTER — Other Ambulatory Visit: Payer: Self-pay

## 2021-06-09 ENCOUNTER — Ambulatory Visit (INDEPENDENT_AMBULATORY_CARE_PROVIDER_SITE_OTHER): Payer: BC Managed Care – PPO | Admitting: Family Medicine

## 2021-06-09 ENCOUNTER — Encounter (INDEPENDENT_AMBULATORY_CARE_PROVIDER_SITE_OTHER): Payer: Self-pay | Admitting: Family Medicine

## 2021-06-09 VITALS — BP 150/72 | HR 86 | Temp 98.0°F | Ht 66.0 in | Wt 221.0 lb

## 2021-06-09 DIAGNOSIS — Z9189 Other specified personal risk factors, not elsewhere classified: Secondary | ICD-10-CM | POA: Diagnosis not present

## 2021-06-09 DIAGNOSIS — F4321 Adjustment disorder with depressed mood: Secondary | ICD-10-CM

## 2021-06-09 DIAGNOSIS — F5102 Adjustment insomnia: Secondary | ICD-10-CM

## 2021-06-09 DIAGNOSIS — Z6838 Body mass index (BMI) 38.0-38.9, adult: Secondary | ICD-10-CM

## 2021-06-09 MED ORDER — TRAZODONE HCL 50 MG PO TABS: ORAL_TABLET | ORAL | 0 refills | Status: DC

## 2021-06-09 MED ORDER — BUPROPION HCL ER (XL) 150 MG PO TB24: 150.0000 mg | ORAL_TABLET | Freq: Every morning | ORAL | 0 refills | Status: DC

## 2021-06-10 ENCOUNTER — Ambulatory Visit: Payer: BC Managed Care – PPO | Admitting: Physician Assistant

## 2021-06-10 ENCOUNTER — Encounter: Payer: Self-pay | Admitting: Physician Assistant

## 2021-06-10 VITALS — BP 128/72 | HR 87 | Ht 66.0 in | Wt 224.8 lb

## 2021-06-10 DIAGNOSIS — L290 Pruritus ani: Secondary | ICD-10-CM

## 2021-06-10 DIAGNOSIS — K602 Anal fissure, unspecified: Secondary | ICD-10-CM | POA: Diagnosis not present

## 2021-06-10 DIAGNOSIS — K5900 Constipation, unspecified: Secondary | ICD-10-CM

## 2021-06-10 MED ORDER — AMBULATORY NON FORMULARY MEDICATION
1 refills | Status: AC
Start: 2021-06-10 — End: ?

## 2021-06-10 NOTE — Patient Instructions (Signed)
If you are age 57 or younger, your body mass index should be between 19-25. Your Body mass index is 36.28 kg/m. If this is out of the aformentioned range listed, please consider follow up with your Primary Care Provider.   The [place] [facility] [facility] providers would like to encourage you to use [facility] to communicate with providers for non-urgent requests or questions.  Due to long hold times on the telephone, sending your provider a message by [facility] may be faster and more efficient way to get a response. Please allow 48 business hours for a response.  Please remember that this is for non-urgent requests/questions.    We have sent a prescription for Diltiazem 2%/Lidocaine 2% gel to [facility] for you. Using your index finger, you should apply a small amount of medication inside the anal opening and to the external anal area twice daily x 4 weeks.  [facility]'s information is below: Address: [address] [place], [place] [address]  [phone]  *Please DO NOT go directly from our office to pick up this medication! Give the pharmacy 1 day to process the prescription as this is compounded and takes time to make.  Increase Miralax to twice a day. Purchase over the counter Recticare with Lidocaine.  Follow up as needed.  It was great seeing you today! Thank you for entrusting me with your care and choosing [facility].  Ellouise Newer, [place]

## 2021-06-10 NOTE — Progress Notes (Signed)
Chief Complaint: "Leakage", rectal itching  HPI:    [name] [name] is a 55 year old African-American female with a past medical history of anal fissure, breast cancer, constipation, fatty liver, GERD, breast cancer and multiple others listed below, known to Dr. Hilarie Fredrickson, who was referred to me by [name], [name], MD for a complaint of "leakage" and rectal itching.    02/28/2021 patient seen by Dr. Dr. Hilarie Fredrickson.  Discussed issue with internal hemorrhoids with bleeding and perianal discomfort.  Also issues with hard stool and straining.  She had been using Colace which helped somewhat.  Heartburn controlled on Pantoprazole 40 mg daily.  Patient had banding at that time.  She was encouraged to use Colace 100 mg every day.    03/22/2021 patient described that since beginning the hemorrhoid on the left side she was having a lot of itching and pain at that time prescribed Lotrisone cream twice daily for 5 to 7 days.  It was noted this does not help but then could recommend some dietary recommendations for itching.    05/29/2021 patient seen in the urgent care for right flank pain.  At that time it was guessed that this was a kidney stone and she was told to go to the hospital but it does not appear she ever went.  She was given Proctosol cream to help alleviate anal itching.    Today, the patient tells me that ever since she had her last banding she has had some itching of her rectum.  Apparently was seen at the urgent care as above a couple of weeks ago and has been using Hydrocortisone once daily over the past 2 weeks and feels 80 to 90% better.  Does tell me though that she continues to have a little bit of leakage of stool.  Also continues with constipation.  Has recently started a magnesium supplement at night and still using a dose of MiraLAX typically a day.  Explains that she was given the list of diet that she should avoid for perianal itching and tells me that she rarely has any of the things listed.    Denies  fever, chills, weight loss or blood in her stool.      Past Medical History:  Diagnosis Date  . Allergy    seasonal  . Anal fissure   . Back pain   . Breast cancer ([place]) 2019   R BREAST IDC  . Complication of anesthesia   . Constipation   . Diverticulitis   . Fatty liver   . GERD (gastroesophageal reflux disease) 07/07/2005  . Heartburn   . Hemorrhoid   . Hepatic steatosis   . Hypertension   . Internal hemorrhoids   . Joint pain   . Malignant neoplasm of lower-inner quadrant of right breast of female, estrogen receptor positive ([place]) 07/16/2018  . Personal history of chemotherapy   . Personal history of radiation therapy   . PONV (postoperative nausea and vomiting)   . Positive H. pylori test   . SOBOE (shortness of breath on exertion)     Past Surgical History:  Procedure Laterality Date  . BREAST BIOPSY Right 07/12/2018   R BREAST IDC  . BREAST LUMPECTOMY Right 08/22/2018   IDC  . BREAST LUMPECTOMY WITH RADIOACTIVE SEED AND SENTINEL LYMPH NODE BIOPSY Right 08/23/2018   Procedure: RIGHT BREAST LUMPECTOMY WITH RADIOACTIVE SEED AND RIGHT SENTINEL LYMPH NODE MAPPING;  Surgeon: Erroll Luna, MD;  Location: [facility];  Service: General;  Laterality: Right;  . COLON  RESECTION  2000's Dr. Hassell Done   Diverticulitis.  . COLONOSCOPY    . FOOT SURGERY Left   . g3p2 c-section1    . KNEE SURGERY Right   . PORT-A-CATH REMOVAL Right 04/15/2019   Procedure: REMOVAL PORT-A-CATH;  Surgeon: Erroll Luna, MD;  Location: [facility];  Service: General;  Laterality: Right;  MAC AND LOCAL ANESTHETIC  . PORTACATH PLACEMENT Right 09/24/2018   Procedure: INSERTION PORT-A-CATH WITH ULTRASOUND;  Surgeon: Erroll Luna, MD;  Location: [facility];  Service: General;  Laterality: Right;  . TONSILLECTOMY    . TUBAL LIGATION      Current Outpatient Medications  Medication Sig Dispense Refill  . amLODipine (NORVASC) 5 MG tablet Take 5 mg by mouth daily.    Marland Kitchen buPROPion  (WELLBUTRIN XL) 150 MG 24 hr tablet Take 1 tablet (150 mg total) by mouth in the morning. 30 tablet 0  . Cholecalciferol (VITAMIN D) 50 MCG (2000 UT) CAPS Alternate 2000 IU and 1000 IU every other day. 30 capsule 0  . clobetasol ointment (TEMOVATE) 6.72 % Apply 1 application topically 2 (two) times daily. Then as needed for 30 days  1  . clotrimazole-betamethasone (LOTRISONE) cream Apply 1 application topically 2 (two) times daily. For 5-7 days 30 g 0  . exemestane (AROMASIN) 25 MG tablet Take 1 tablet (25 mg total) by mouth daily after breakfast. 90 tablet 3  . hydrocortisone (PROCTOSOL HC) 2.5 % rectal cream Place 1 application rectally daily. 30 g 0  . losartan (COZAAR) 50 MG tablet Take 50 mg by mouth daily.    . metFORMIN (GLUCOPHAGE) 500 MG tablet 1 po w/ lunch QD 30 tablet 0  . Multiple Vitamins-Minerals (MULTIVITAMIN WITH MINERALS) tablet Take 1 tablet by mouth daily.    . naproxen (NAPROSYN) 500 MG tablet Take 1 tablet (500 mg total) by mouth 2 (two) times daily as needed for headache. 30 tablet 0  . pantoprazole (PROTONIX) 40 MG tablet Take 1 tablet (40 mg total) by mouth daily. 30 tablet 1  . Probiotic Product (PRO-BIOTIC BLEND PO) Take 1 capsule by mouth daily.    . rosuvastatin (CRESTOR) 10 MG tablet TAKE 1 TABLET(10 MG) BY MOUTH DAILY 30 tablet 5  . spironolactone (ALDACTONE) 50 MG tablet TAKE 1 TABLET(50 MG) BY MOUTH DAILY (Patient taking differently: Take 50 mg by mouth daily.) 90 tablet 0  . tiZANidine (ZANAFLEX) 4 MG tablet Take 1 tablet (4 mg total) by mouth every 6 (six) hours as needed for muscle spasms. 30 tablet 0  . traZODone (DESYREL) 50 MG tablet 1-2 tabs po prn sleep 60 tablet 0   No current facility-administered medications for this visit.    Allergies as of 06/10/2021 - Review Complete 06/09/2021  Allergen Reaction Noted  . Ibuprofen Swelling 01/16/2012  . Lisinopril Swelling 03/11/2008  . Dilaudid [hydromorphone hcl] Nausea Only 06/26/2012  . Tape Rash  04/08/2019    Family History  Problem Relation Age of Onset  . Hypertension Mother   . Breast cancer Mother 32  . Cancer Mother   . Alcoholism Mother   . Drug abuse Mother   . Hypertension Father   . Heart disease Father   . Heart attack Father   . Hypertension Brother   . Diabetes Neg Hx   . COPD Neg Hx   . Colon cancer Neg Hx   . Stomach cancer Neg Hx   . Pancreatic cancer Neg Hx   . Esophageal cancer Neg Hx   . Rectal  cancer Neg Hx     Social History   Socioeconomic History  . Marital status: Divorced    Spouse name: Not on file  . Number of children: 3  . Years of education: 35  . Highest education level: Not on file  Occupational History  . 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 Smoking status: Never  . Smokeless tobacco: Never  Vaping Use  . Vaping Use: Never used  Substance and Sexual Activity  . Alcohol use: No    Alcohol/week: 0.0 standard drinks  . Drug use: No  . Sexual activity: Not Currently    Birth control/protection: Post-menopausal  Other Topics Concern  . Not on file  Social History Narrative   [employer], [employer] - Human service/sociology. Married '2000 - 48yr/seperated. 2 boys - '82, '87, 1 dtr - '81.   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    Social Determinants of Health   Financial Resource Strain: Not on file  Food Insecurity: Not on file  Transportation Needs: Not on file  Physical Activity: Not on file  Stress: Not on file  Social Connections: Not on file  Intimate Partner Violence: Not on file    Review of Systems:    Constitutional: No weight loss, fever or chills Cardiovascular: No chest pain Respiratory: No SOB  Gastrointestinal: See HPI and otherwise negative   Physical Exam:  Vital signs: BP 128/72   Pulse 87   Ht _0  (1.676 m)   Wt 224 lb 12.8 oz (102 kg)   LMP 09/13/2015 Comment: celebate, BTL  SpO2 98%   BMI 36.28 kg/m    Constitutional:   Pleasant Caucasian female appears to be in NAD, Well  developed, Well nourished, alert and cooperative Respiratory: Respirations even and unlabored. Lungs clear to auscultation bilaterally.   No wheezes, crackles, or rhonchi.  Cardiovascular: Normal S1, S2. No MRG. Regular rate and rhythm. No peripheral edema, cyanosis or pallor.  Gastrointestinal:  Soft, nondistended, nontender. No rebound or guarding. Normal bowel sounds. No appreciable masses or hepatomegaly. Rectal: External: Small posterior fissure; anterior: No mass, no residue; anoscopy: Normal Psychiatric:  Demonstrates good judgement and reason without abnormal affect or behaviors.  RELEVANT LABS AND IMAGING: CBC    Component Value Date/Time   WBC 4.6 04/28/2021 1109   WBC 5.8 07/06/2020 2348   RBC 4.29 04/28/2021 1109   RBC 4.32 07/06/2020 2348   HGB 12.9 04/28/2021 1109   HCT 40.1 04/28/2021 1109   PLT 129 (L) 04/28/2021 1109   MCV 94 04/28/2021 1109   MCH 30.1 04/28/2021 1109   MCH 29.6 07/06/2020 2348   MCHC 32.2 04/28/2021 1109   MCHC 32.4 07/06/2020 2348   RDW 12.8 04/28/2021 1109   LYMPHSABS 1.2 04/28/2021 1109   MONOABS 0.4 07/06/2020 2348   EOSABS 0.1 04/28/2021 1109   BASOSABS 0.0 04/28/2021 1109    CMP     Component Value Date/Time   NA 140 04/28/2021 1109   K 4.2 04/28/2021 1109   CL 103 04/28/2021 1109   CO2 25 04/28/2021 1109   GLUCOSE 84 04/28/2021 1109   GLUCOSE 103 (H) 07/06/2020 2348   BUN 11 04/28/2021 1109   CREATININE 0.97 04/28/2021 1109   CREATININE 0.89 01/29/2019 1010   CALCIUM 9.8 04/28/2021 1109   PROT 7.0 04/28/2021 1109   ALBUMIN 4.7 04/28/2021 1109   AST 17 04/28/2021 1109   AST 24 01/29/2019 1010   ALT 12 04/28/2021 1109   ALT 26 01/29/2019  1010   ALKPHOS 83 04/28/2021 1109   BILITOT 0.6 04/28/2021 1109   BILITOT 0.3 01/29/2019 1010   GFRNONAA 77 08/26/2020 1121   GFRNONAA >60 07/06/2020 2348   GFRNONAA >60 01/29/2019 1010   GFRAA 89 08/26/2020 1121   GFRAA >60 01/29/2019 1010    Assessment: 1.  Internal hemorrhoids:  These look good today, no need for repeat banding 2.  Posterior fissure: Small and shallow, seen at time of exam today likely contributing to itching 3.  Rectal itching: Likely related to above 4.  Chronic constipation: Leading to all of the above  Plan: 1.  Recommend the patient use MiraLAX up to 4 times a day to maintain soft solid stools.  Ultimately her constipation is what is leading to all of her rectal issues.  If we can get this better then she may not have as many problems. 2.  Prescribe Diltiazem gel to be applied to the rectum 3 times a day for the next 6 to 8 weeks for fissure 3.  Discussed sitz bath's 2-3 times a day for 15 to 20 minutes 4.  Discussed over-the-counter RectiCare lidocaine as needed 5.  Patient to follow in clinic as needed for ongoing symptoms.  [name] Newer, PA-C [facility] 06/10/2021, 8:43 AM  Cc: Leonides Sake, MD

## 2021-06-13 NOTE — Progress Notes (Signed)
Chief Complaint:   OBESITY [name] [name] is here to discuss her progress with her obesity treatment plan along with follow-up of her obesity related diagnoses. [name] [name] is on keeping a food journal and adhering to recommended goals of 1200-1300 calories and 80 grams protein and states she is following her eating plan approximately 0% of the time. [name] [name] states she is not currently exercising.  Today's visit was #: 14 Starting weight: 237 lbs Starting date: 08/26/2020 Today's weight: 221 lbs Today's date: 06/09/2021 Total lbs lost to date: 16 Total lbs lost since last in-office visit: +1  Interim History: [name] [name]'s grandson died on 05/30/2021. She is not eating on plan and only getting 4-5 hours of sleep per night. Depressed.  Subjective:   1. Adjustment disorder with depressed mood [name] [name] has no history of anti-depressant or anti-anxiety meds in the past. It has been 3 weeks since her grandson was murdered. Pt is sad and tearful a lot, not sleeping well, and staying away from others. Pt has not history of seizure disorder.  2. Insomnia due to psychological stress [name] [name] is tossing and turning and worrying and can't sleep.  3. At risk for depression [name] [name] is at elevated risk of depression due to stress reaction.  Assessment/Plan:  No orders of the defined types were placed in this encounter.   There are no discontinued medications.   Meds ordered this encounter  Medications   buPROPion (WELLBUTRIN XL) 150 MG 24 hr tablet    Sig: Take 1 tablet (150 mg total) by mouth in the morning.    Dispense:  30 tablet    Refill:  0   traZODone (DESYREL) 50 MG tablet    Sig: 1-2 tabs po prn sleep    Dispense:  60 tablet    Refill:  0     1. Adjustment disorder with depressed mood Start Wellbutrin XL 150 mg every morning. Start exercising daily. Start counseling weekly with pastor or other therapist. Continue with support of family and friends.  Start- buPROPion  (WELLBUTRIN XL) 150 MG 24 hr tablet; Take 1 tablet (150 mg total) by mouth in the morning.  Dispense: 30 tablet; Refill: 0  2. Insomnia due to psychological stress Start trazodone 50 mg. Sleep hygiene counseling done.  Start- traZODone (DESYREL) 50 MG tablet; 1-2 tabs po prn sleep  Dispense: 60 tablet; Refill: 0  3. At risk for depression [name] [name] was given approximately 22 minutes of depression risk counseling today. She has risk factors for depression including stress reaction. We discussed the importance of a healthy work life balance, a healthy relationship with food and a good support system.  Repetitive spaced learning was employed today to elicit superior memory formation and behavioral change.   4. Obesity with current BMI of 35.7  [name] [name] is currently in the action stage of change. As such, her goal is to continue with weight loss efforts. She has agreed to keeping a food journal and adhering to recommended goals of 1200-1300 calories and 80 grams protein.   Pt will also start counseling with her pastor.  Exercise goals:  Start 15-20 minutes QD for stress management.  Behavioral modification strategies: increasing lean protein intake, decreasing simple carbohydrates, and keeping healthy foods in the home.  [name] [name] has agreed to follow-up with our clinic in 2-3 weeks. She was informed of the importance of frequent follow-up visits to maximize her success with intensive lifestyle modifications for her multiple health conditions.   Objective:   Blood pressure (!) 150/72, pulse  86, temperature 98 F (36.7 C), height 5\' 6"  (1.676 m), weight 221 lb (100.2 kg), last menstrual period 09/13/2015, SpO2 99 %. Body mass index is 35.67 kg/m.  General: Cooperative, alert, well developed, in no acute distress. HEENT: Conjunctivae and lids unremarkable. Cardiovascular: Regular rhythm.  Lungs: Normal work of breathing. Neurologic: No focal deficits.   Lab Results  Component Value  Date   CREATININE 0.97 04/28/2021   BUN 11 04/28/2021   NA 140 04/28/2021   K 4.2 04/28/2021   CL 103 04/28/2021   CO2 25 04/28/2021   Lab Results  Component Value Date   ALT 12 04/28/2021   AST 17 04/28/2021   ALKPHOS 83 04/28/2021   BILITOT 0.6 04/28/2021   Lab Results  Component Value Date   HGBA1C 5.4 04/28/2021   HGBA1C 5.4 08/26/2020   Lab Results  Component Value Date   INSULIN 21.0 04/28/2021   INSULIN 20.0 08/26/2020   Lab Results  Component Value Date   TSH 1.440 08/26/2020   Lab Results  Component Value Date   CHOL 137 04/28/2021   HDL 43 04/28/2021   LDLCALC 79 04/28/2021   TRIG 77 04/28/2021   CHOLHDL 3.2 04/28/2021   Lab Results  Component Value Date   VD25OH 70.4 04/28/2021   VD25OH 46.9 08/26/2020   Lab Results  Component Value Date   WBC 4.6 04/28/2021   HGB 12.9 04/28/2021   HCT 40.1 04/28/2021   MCV 94 04/28/2021   PLT 129 (L) 04/28/2021   Lab Results  Component Value Date   IRON 76 04/27/2009   FERRITIN 30.7 04/27/2009   Attestation Statements:   Reviewed by clinician on day of visit: allergies, medications, problem list, medical history, surgical history, family history, social history, and previous encounter notes.  Coral Ceo, CMA, am acting as transcriptionist for [employer], DO.  I have reviewed the above documentation for accuracy and completeness, and I agree with the above. Marjory Sneddon, D.O.  The [place] was signed into law in 2016 which includes the topic of electronic health records.  This provides immediate access to information in MyChart.  This includes consultation notes, operative notes, office notes, lab results and pathology reports.  If you have any questions about what you read please let us know at your next visit so we can discuss your concerns and take corrective action if need be.  We are right here with you.

## 2021-06-15 NOTE — Progress Notes (Signed)
Addendum: Reviewed and agree with assessment and management plan. Lyfe Reihl M, MD  

## 2021-06-30 ENCOUNTER — Ambulatory Visit (INDEPENDENT_AMBULATORY_CARE_PROVIDER_SITE_OTHER): Payer: BC Managed Care – PPO

## 2021-06-30 ENCOUNTER — Ambulatory Visit (INDEPENDENT_AMBULATORY_CARE_PROVIDER_SITE_OTHER): Payer: BC Managed Care – PPO | Admitting: Family Medicine

## 2021-06-30 ENCOUNTER — Ambulatory Visit: Payer: BC Managed Care – PPO | Admitting: Podiatry

## 2021-06-30 ENCOUNTER — Encounter (INDEPENDENT_AMBULATORY_CARE_PROVIDER_SITE_OTHER): Payer: Self-pay | Admitting: Family Medicine

## 2021-06-30 ENCOUNTER — Encounter: Payer: Self-pay | Admitting: Podiatry

## 2021-06-30 ENCOUNTER — Other Ambulatory Visit: Payer: Self-pay

## 2021-06-30 VITALS — BP 105/73 | HR 106 | Temp 97.9°F | Ht 66.0 in | Wt 226.0 lb

## 2021-06-30 DIAGNOSIS — F4321 Adjustment disorder with depressed mood: Secondary | ICD-10-CM | POA: Diagnosis not present

## 2021-06-30 DIAGNOSIS — F5102 Adjustment insomnia: Secondary | ICD-10-CM

## 2021-06-30 DIAGNOSIS — M779 Enthesopathy, unspecified: Secondary | ICD-10-CM

## 2021-06-30 DIAGNOSIS — Z9189 Other specified personal risk factors, not elsewhere classified: Secondary | ICD-10-CM | POA: Diagnosis not present

## 2021-06-30 DIAGNOSIS — Z6838 Body mass index (BMI) 38.0-38.9, adult: Secondary | ICD-10-CM

## 2021-06-30 DIAGNOSIS — M6740 Ganglion, unspecified site: Secondary | ICD-10-CM

## 2021-06-30 NOTE — Progress Notes (Signed)
Subjective:   Patient ID: [name] [name], female   DOB: 55 y.o.   MRN: [id number]   HPI Patient presents stating she has developed a knot on top of her right foot and it is painful especially when she brings her foot up.  States is developed over the last several months   ROS      Objective:  Physical Exam  Neurovascular status intact with an approximate 1 cm x 1 cm mass in the right dorsum of the foot towards the ankle joint appears to be freely movable within subcutaneous tissue     Assessment:  Probability for ganglionic cyst right dorsal foot     Plan:  H&P reviewed condition at this point I did a forefoot block of the right foot sterile prep done utilizing 10 cc syringe I aspirated the area and I was able to get out around a cc of gelatinous material and I applied sterile dressing with compression.  Reappoint if symptoms indicate may require excision if it reoccurs  X-rays were negative for signs of spur formation or bony condition with this

## 2021-07-04 NOTE — Progress Notes (Signed)
Chief Complaint:   OBESITY [name] [name] is here to discuss her progress with her obesity treatment plan along with follow-up of her obesity related diagnoses. [name] is on keeping a food journal and adhering to recommended goals of 1200-1300 calories and 80 grams of protein daily and states she is following her eating plan approximately 90% of the time. Krisinda states she is at the gym on the treadmill for 40 minutes and weights for 30 minutes 6 times per week.  Today's visit was #: 15 Starting weight: 237 lbs Starting date: 08/26/2020 Today's weight: 226 lbs Today's date: 06/30/2021 Total lbs lost to date: 11 Total lbs lost since last in-office visit: 0  Interim History: [name] [name] is upset with her weight gain today. She endorses following the program 90% of the time. She is frustrated and she is looking for answers as to why. Upon review of food recall; breakfast is a protein shake, lunch is 4 oz of chicken and collard greens, and dinner is 1 bowl of regular cereal with [employer] milk. She endorse that she has not been weighing or measuring her food and she forgot to bring her food log in today.  She has been doing great with exercise.  Subjective:   1. Adjustment disorder with depressed mood [name] was placed on Wellbutrin on 06/09/2021, but she was seen by her primary care physician (unable to see medication records) and taken off Wellbutrin; then put on something else (unknown drug). [name] notes she is unsure why her primary care physician discontinued Wellbutrin, as she was tolerating it well.   2. Insomnia due to psychological stress [name]'s primary care physician discontinued trazodone and put her on "another medication", she is not sure which one. Per the patient, her primary care physician preferred other medications instead.  3. At risk for adverse drug reaction [name] is at risk for drug side effects due to change in drug for management of mood, that's causes  weight gain.  Assessment/Plan:  No orders of the defined types were placed in this encounter.   Medications Discontinued During This Encounter  Medication Reason   buPROPion (WELLBUTRIN XL) 150 MG 24 hr tablet Error   Multiple Vitamins-Minerals (MULTIVITAMIN WITH MINERALS) tablet Error   Probiotic Product (PRO-BIOTIC BLEND PO) Error   traZODone (DESYREL) 50 MG tablet Error     No orders of the defined types were placed in this encounter.    1. Adjustment disorder with depressed mood [name] [name] was educated on weight gain with most of all mood medications after Wellbutrin. She will call or e-mail later with the changes and medication doses as well as names of new drugs she is on now. We discussed stress management techniques in helping her deal with her grandson's recent death as well.  2. Insomnia due to psychological stress I recommended that Candid continues with her treatment plan per primary care physician. She will let [name] [name] know the names and doses on new sleep medications.   3. At risk for adverse drug reaction [name] [name] was given approximately 15 minutes of drug side effect counseling today.  We discussed side effect possibility and risk versus benefits. [name] [name] agreed to the medication and will contact this office if these side effects are intolerable.  Repetitive spaced learning was employed today to elicit superior memory formation and behavioral change.  4. Obesity with current BMI of 36.5 [name] [name] is currently in the action stage of change. As such, her goal is to continue with weight loss efforts. She has agreed  to keeping a food journal and adhering to recommended goals of 1200-1300 calories and 80+ grams of protein daily.   Exercise goals: As is.  Behavioral modification strategies: increasing lean protein intake, decreasing simple carbohydrates, planning for success, and keeping a strict food journal.  [name] [name] has agreed to follow-up with our clinic in 2 to 3  weeks. She was informed of the importance of frequent follow-up visits to maximize her success with intensive lifestyle modifications for her multiple health conditions.   Objective:   Blood pressure 105/73, pulse (!) 106, temperature 97.9 F (36.6 C), height 5\' 6"  (1.676 m), weight 226 lb (102.5 kg), last menstrual period 09/13/2015, SpO2 98 %. Body mass index is 36.48 kg/m.  General: Cooperative, alert, well developed, in no acute distress. HEENT: Conjunctivae and lids unremarkable. Cardiovascular: Regular rhythm.  Lungs: Normal work of breathing. Neurologic: No focal deficits.   Lab Results  Component Value Date   CREATININE 0.97 04/28/2021   BUN 11 04/28/2021   NA 140 04/28/2021   K 4.2 04/28/2021   CL 103 04/28/2021   CO2 25 04/28/2021   Lab Results  Component Value Date   ALT 12 04/28/2021   AST 17 04/28/2021   ALKPHOS 83 04/28/2021   BILITOT 0.6 04/28/2021   Lab Results  Component Value Date   HGBA1C 5.4 04/28/2021   HGBA1C 5.4 08/26/2020   Lab Results  Component Value Date   INSULIN 21.0 04/28/2021   INSULIN 20.0 08/26/2020   Lab Results  Component Value Date   TSH 1.440 08/26/2020   Lab Results  Component Value Date   CHOL 137 04/28/2021   HDL 43 04/28/2021   LDLCALC 79 04/28/2021   TRIG 77 04/28/2021   CHOLHDL 3.2 04/28/2021   Lab Results  Component Value Date   VD25OH 70.4 04/28/2021   VD25OH 46.9 08/26/2020   Lab Results  Component Value Date   WBC 4.6 04/28/2021   HGB 12.9 04/28/2021   HCT 40.1 04/28/2021   MCV 94 04/28/2021   PLT 129 (L) 04/28/2021   Lab Results  Component Value Date   IRON 76 04/27/2009   FERRITIN 30.7 04/27/2009   Attestation Statements:   Reviewed by clinician on day of visit: allergies, medications, problem list, medical history, surgical history, family history, social history, and previous encounter notes.   Wilhemena Durie, am acting as transcriptionist for [employer], DO.  I have reviewed the  above documentation for accuracy and completeness, and I agree with the above. Marjory Sneddon, D.O.  The [place] was signed into law in 2016 which includes the topic of electronic health records.  This provides immediate access to information in MyChart.  This includes consultation notes, operative notes, office notes, lab results and pathology reports.  If you have any questions about what you read please let [name] [name] know at your next visit so we can discuss your concerns and take corrective action if need be.  We are right here with you.

## 2021-07-07 ENCOUNTER — Encounter (HOSPITAL_BASED_OUTPATIENT_CLINIC_OR_DEPARTMENT_OTHER): Payer: Self-pay | Admitting: Emergency Medicine

## 2021-07-07 ENCOUNTER — Other Ambulatory Visit: Payer: Self-pay

## 2021-07-07 ENCOUNTER — Emergency Department (HOSPITAL_BASED_OUTPATIENT_CLINIC_OR_DEPARTMENT_OTHER)
Admission: EM | Admit: 2021-07-07 | Discharge: 2021-07-07 | Disposition: A | Discharge status: Home / Self Care | Payer: BC Managed Care – PPO | Attending: Emergency Medicine | Admitting: Emergency Medicine

## 2021-07-07 DIAGNOSIS — Z79899 Other long term (current) drug therapy: Secondary | ICD-10-CM | POA: Insufficient documentation

## 2021-07-07 DIAGNOSIS — M79671 Pain in right foot: Secondary | ICD-10-CM | POA: Insufficient documentation

## 2021-07-07 DIAGNOSIS — I10 Essential (primary) hypertension: Secondary | ICD-10-CM | POA: Diagnosis not present

## 2021-07-07 DIAGNOSIS — Z853 Personal history of malignant neoplasm of breast: Secondary | ICD-10-CM | POA: Diagnosis not present

## 2021-07-07 NOTE — ED Triage Notes (Signed)
Pt reports having procedure done on right foot x 1 week ago and preceded to have pain going up back of right leg.

## 2021-07-07 NOTE — Discharge Instructions (Addendum)
For pain control you may take at 1000 mg of Tylenol every 8 hours as needed. 

## 2021-07-07 NOTE — ED Provider Notes (Signed)
[facility] Provider Note  CSN: [id number] Arrival date & time: 07/07/21 3007  Chief Complaint(s) Leg Pain  HPI [name] [name] is a 55 y.o. female with a past medical history listed below who recently had a right dorsal foot capsulitis aspiration performed by podiatry last week who presents to the emergency department with persistent right foot pain now radiating up her right leg.  Described as deep aching in the foot with sharp shooting pains up the right leg.  Worse with palpation and ambulation.  Alleviated by mobility.  Denies falls or trauma.  Reports that she has been walking on the side of her right foot due to the pain.  No swelling or redness.  No fevers or chills.  No other physical complaints.   Leg Pain  Past Medical History Past Medical History:  Diagnosis Date   Allergy    seasonal   Anal fissure    Back pain    Breast cancer ([place]) 2019   R BREAST IDC   Complication of anesthesia    Constipation    Diverticulitis    Fatty liver    GERD (gastroesophageal reflux disease) 07/07/2005   Heartburn    Hemorrhoid    Hepatic steatosis    Hypertension    Internal hemorrhoids    Joint pain    Malignant neoplasm of lower-inner quadrant of right breast of female, estrogen receptor positive ([place]) 07/16/2018   Personal history of chemotherapy    Personal history of radiation therapy    PONV (postoperative nausea and vomiting)    Positive H. pylori test    SOBOE (shortness of breath on exertion)    Patient Active Problem List   Diagnosis Date Noted   Insulin resistance 10/21/2020   Gastroesophageal reflux disease 10/21/2020   White coat syndrome with hypertension 10/21/2020   At risk for diabetes mellitus 10/21/2020   Constipation 09/30/2020   SOBOE (shortness of breath on exertion) 08/26/2020   Vitamin D deficiency 08/26/2020   Other fatigue 08/26/2020   Malignant neoplasm of right female breast ([place]) 08/26/2020   At risk for  impaired metabolic function 62/26/3335   Elevated coronary artery calcium score 07/14/2020   Mixed hyperlipidemia 07/14/2020   Atypical chest pain 07/08/2020   Chronic fatigue 07/07/2020   Left arm pain 12/05/2019   Leg edema 05/31/2019   Port-A-Cath in place 09/25/2018   Malignant neoplasm of lower-inner quadrant of right breast of female, estrogen receptor positive ([place]) 07/16/2018   Screening cholesterol level 07/10/2017   Anal fissure 07/07/2014   Pruritic rash 06/16/2014   Class 2 severe obesity with serious comorbidity and body mass index (BMI) of 38.0 to 38.9 in adult ([place]) 06/16/2014   Primary localized osteoarthrosis, lower leg 11/18/2013   Bilateral knee pain 09/08/2013   VITAMIN B12 DEFICIENCY 02/10/2010   Essential hypertension 08/16/2007   ALLERGIC RHINITIS 08/16/2007   GERD 08/16/2007   Right upper quadrant pain 05/13/2007   Home Medication(s) Prior to Admission medications   Medication Sig Start Date End Date Taking? Authorizing Provider  AMBULATORY NON FORMULARY MEDICATION Medication Name: Diltiazem 2%/Lidocaine 2%   Using your index finger apply a small amount of medication inside the anal opening and to the external anal area three times daily x 6-8 weeks. 06/10/21   Levin Erp, PA  amLODipine (NORVASC) 5 MG tablet Take 5 mg by mouth daily.    [provider]  Cholecalciferol (VITAMIN D) 50 MCG (2000 UT) CAPS Alternate 2000 IU and 1000 IU  every other day. 05/19/21   Whitmire, Joneen Boers, FNP  clobetasol ointment (TEMOVATE) 3.49 % Apply 1 application topically 2 (two) times daily. Then as needed for 30 days 07/03/18   [provider]  clotrimazole-betamethasone (LOTRISONE) cream Apply 1 application topically 2 (two) times daily. For 5-7 days 03/22/21   Jerene Bears, MD  exemestane (AROMASIN) 25 MG tablet Take 1 tablet (25 mg total) by mouth daily after breakfast. 10/18/20   Nicholas Lose, MD  hydrocortisone (PROCTOSOL HC) 2.5 % rectal cream  Place 1 application rectally daily. 05/29/21   Teodora Medici, FNP  losartan (COZAAR) 50 MG tablet Take 50 mg by mouth daily.    [provider]  pantoprazole (PROTONIX) 40 MG tablet Take 1 tablet (40 mg total) by mouth daily. 01/12/21   Esterwood, Amy S, PA-C  rosuvastatin (CRESTOR) 10 MG tablet TAKE 1 TABLET(10 MG) BY MOUTH DAILY 02/08/21   Patwardhan, Manish J, MD  spironolactone (ALDACTONE) 50 MG tablet TAKE 1 TABLET(50 MG) BY MOUTH DAILY Patient taking differently: Take 50 mg by mouth daily. 07/28/19   Nigel Mormon, MD                                                                                                                                    Past Surgical History Past Surgical History:  Procedure Laterality Date   BREAST BIOPSY Right 07/12/2018   R BREAST IDC   BREAST LUMPECTOMY Right 08/22/2018   IDC   BREAST LUMPECTOMY WITH RADIOACTIVE SEED AND SENTINEL LYMPH NODE BIOPSY Right 08/23/2018   Procedure: RIGHT BREAST LUMPECTOMY WITH RADIOACTIVE SEED AND RIGHT SENTINEL LYMPH NODE MAPPING;  Surgeon: Erroll Luna, MD;  Location: [facility];  Service: General;  Laterality: Right;   COLON RESECTION  2000's Dr. Hassell Done   Diverticulitis.   COLONOSCOPY     FOOT SURGERY Left    g3p2 c-section1     KNEE SURGERY Right    PORT-A-CATH REMOVAL Right 04/15/2019   Procedure: REMOVAL PORT-A-CATH;  Surgeon: Erroll Luna, MD;  Location: [facility];  Service: General;  Laterality: Right;  MAC AND LOCAL ANESTHETIC   PORTACATH PLACEMENT Right 09/24/2018   Procedure: INSERTION PORT-A-CATH WITH ULTRASOUND;  Surgeon: Erroll Luna, MD;  Location: [facility];  Service: General;  Laterality: Right;   TONSILLECTOMY     TUBAL LIGATION     Family History Family History  Problem Relation Age of Onset   Hypertension Mother    Breast cancer Mother 62   Cancer Mother    Alcoholism Mother    Drug abuse Mother    Hypertension Father    Heart disease Father    Heart  attack Father    Hypertension Brother    Diabetes Neg Hx    COPD Neg Hx    Colon cancer Neg Hx    Stomach cancer Neg Hx    Pancreatic cancer Neg Hx  Esophageal cancer Neg Hx    Rectal cancer Neg Hx     Social History Social History   Tobacco Use   Smoking status: Never   Smokeless tobacco: Never  Vaping Use   Vaping Use: Never used  Substance Use Topics   Alcohol use: No    Alcohol/week: 0.0 standard drinks   Drug use: No   Allergies Ibuprofen, Lisinopril, Dilaudid [hydromorphone hcl], and Tape  Review of Systems Review of Systems All other systems are reviewed and are negative for acute change except as noted in the HPI  Physical Exam Vital Signs  I have reviewed the triage vital signs BP 137/82   Pulse 88   Temp 98.1 F (36.7 C) (Oral)   Resp 16   Ht _0  (1.676 m)   Wt 100.2 kg   LMP 09/13/2015 Comment: celebate, BTL  SpO2 99%   BMI 35.67 kg/m   Physical Exam Vitals reviewed.  Constitutional:      General: She is not in acute distress.    Appearance: She is well-developed. She is obese. She is not diaphoretic.  HENT:     Head: Normocephalic and atraumatic.     Right Ear: External ear normal.     Left Ear: External ear normal.     Nose: Nose normal.  Eyes:     General: No scleral icterus.    Conjunctiva/sclera: Conjunctivae normal.  Neck:     Trachea: Phonation normal.  Cardiovascular:     Rate and Rhythm: Normal rate and regular rhythm.  Pulmonary:     Effort: Pulmonary effort is normal. No respiratory distress.     Breath sounds: No stridor.  Abdominal:     General: There is no distension.  Musculoskeletal:        General: Normal range of motion.     Cervical back: Normal range of motion.     Right foot: Normal range of motion. Tenderness present. No swelling, deformity or laceration. Normal pulse.     Left foot: Normal pulse.       Feet:  Neurological:     Mental Status: She is alert and oriented to person, place, and time.   Psychiatric:        Behavior: Behavior normal.    ED Results and Treatments Labs (all labs ordered are listed, but only abnormal results are displayed) Labs Reviewed - No data to display                                                                                                                       EKG  EKG Interpretation  Date/Time:    Ventricular Rate:    PR Interval:    QRS Duration:   QT Interval:    QTC Calculation:   R Axis:     Text Interpretation:         Radiology No results found.  Pertinent labs & imaging results that were available during my care of the patient were reviewed by me  and considered in my medical decision making (see MDM for details).  Medications Ordered in ED Medications - No data to display                                                                                                                                   Procedures Procedures  (including critical care time)  Medical Decision Making / ED Course I have reviewed the nursing notes for this encounter and the patient's prior records (if available in EHR or on provided paperwork).  [name] [name] was evaluated in Emergency Department on 07/08/2021 for the symptoms described in the history of present illness. She was evaluated in the context of the global COVID-19 pandemic, which necessitated consideration that the patient might be at risk for infection with the SARS-CoV-2 virus that causes COVID-19. Institutional protocols and algorithms that pertain to the evaluation of patients at risk for COVID-19 are in a state of rapid change based on information released by regulatory bodies including the CDC and federal and state organizations. These policies and algorithms were followed during the patient's care in the ED.     Right dorsal foot pain. No evidence of cellulitis or abscess. Doubt DVT. Doubt arterial occlusion.  Possible persistent capsulitis versus nerve injury  from nerve block. Adjusted ambulation may also be contributing to right leg pain.  Continued supportive management recommended with podiatry follow up as scheduled for next week.   Final Clinical Impression(s) / ED Diagnoses Final diagnoses:  Right foot pain   The patient appears reasonably screened and/or stabilized for discharge and I doubt any other medical condition or other [facility] requiring further screening, evaluation, or treatment in the ED at this time prior to discharge. Safe for discharge with strict return precautions.  Disposition: Discharge  Condition: Good  I have discussed the results, Dx and Tx plan with the patient/family who expressed understanding and agree(s) with the plan. Discharge instructions discussed at length. The patient/family was given strict return precautions who verbalized understanding of the instructions. No further questions at time of discharge.    ED Discharge Orders     None        Follow Up: Wallene Huh, DPM [address] [place] [address] [phone]  Call  to schedule an appointment for close follow up     This chart was dictated using voice recognition software.  Despite best efforts to proofread,  errors can occur which can change the documentation meaning.    Fatima Blank, MD 07/08/21 [id number]

## 2021-08-08 IMAGING — CR DG THORACIC SPINE 3V
3 series · 3 of 3 positions shown · non-contrast
Comparison: None.

CLINICAL DATA: Back spasms

EXAM:
THORACIC SPINE - 3 VIEWS

[t t-spine a.p.]
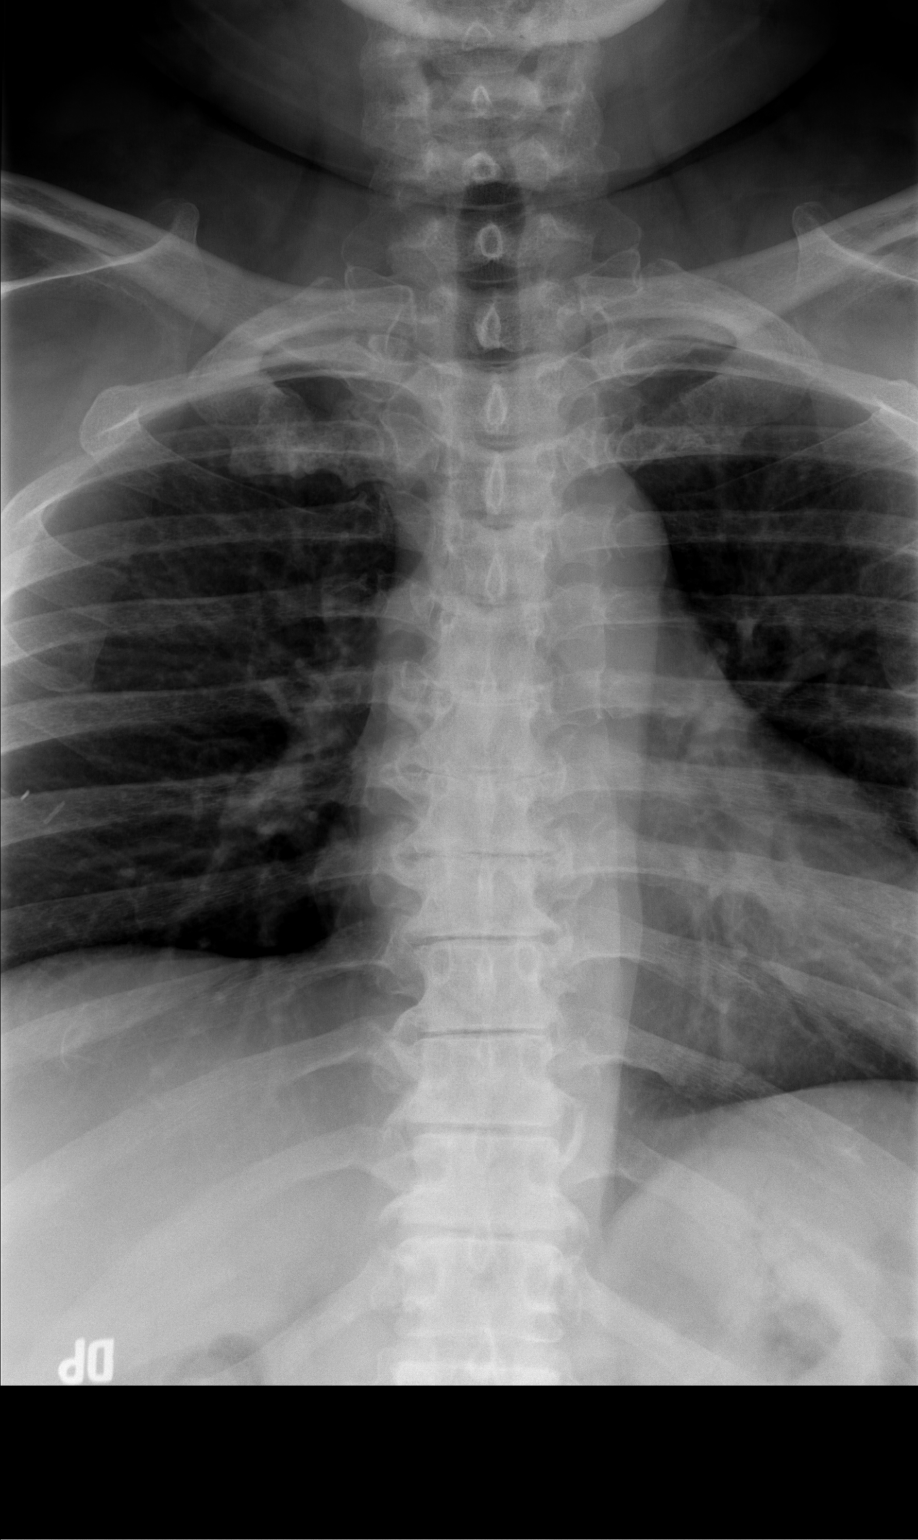

[t t-spine lat *]
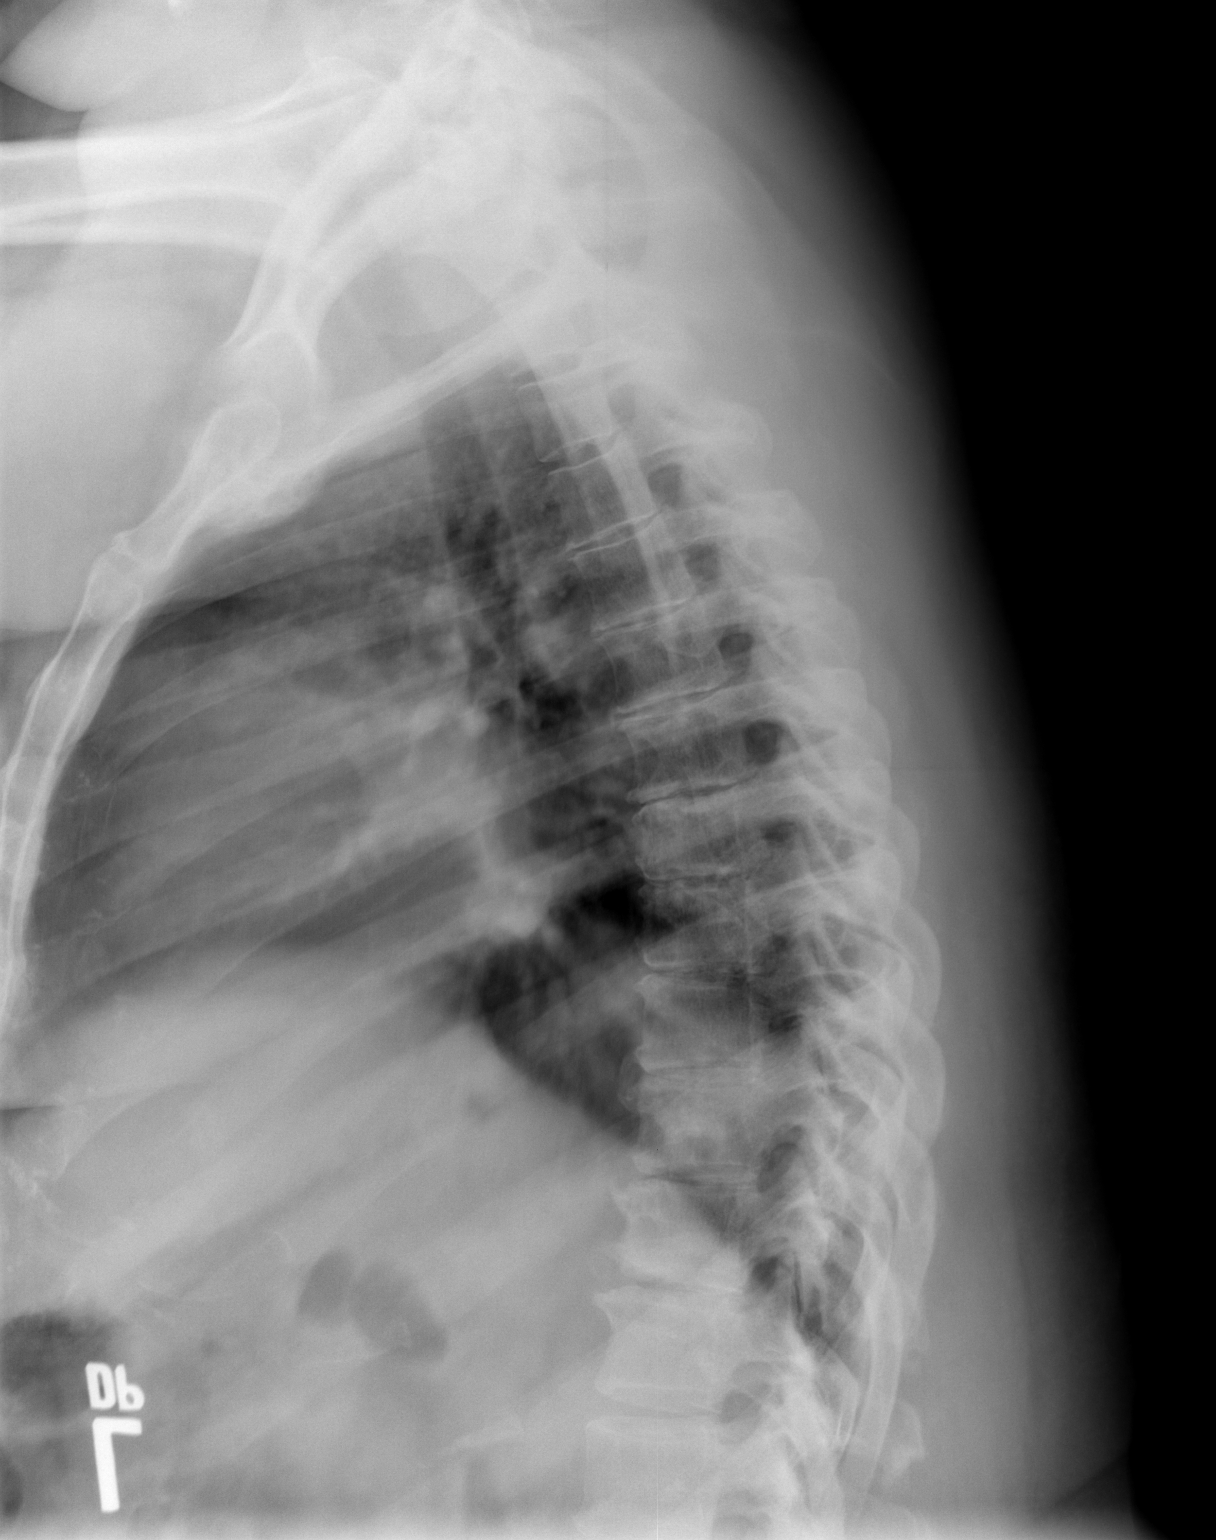

[t swimmers]
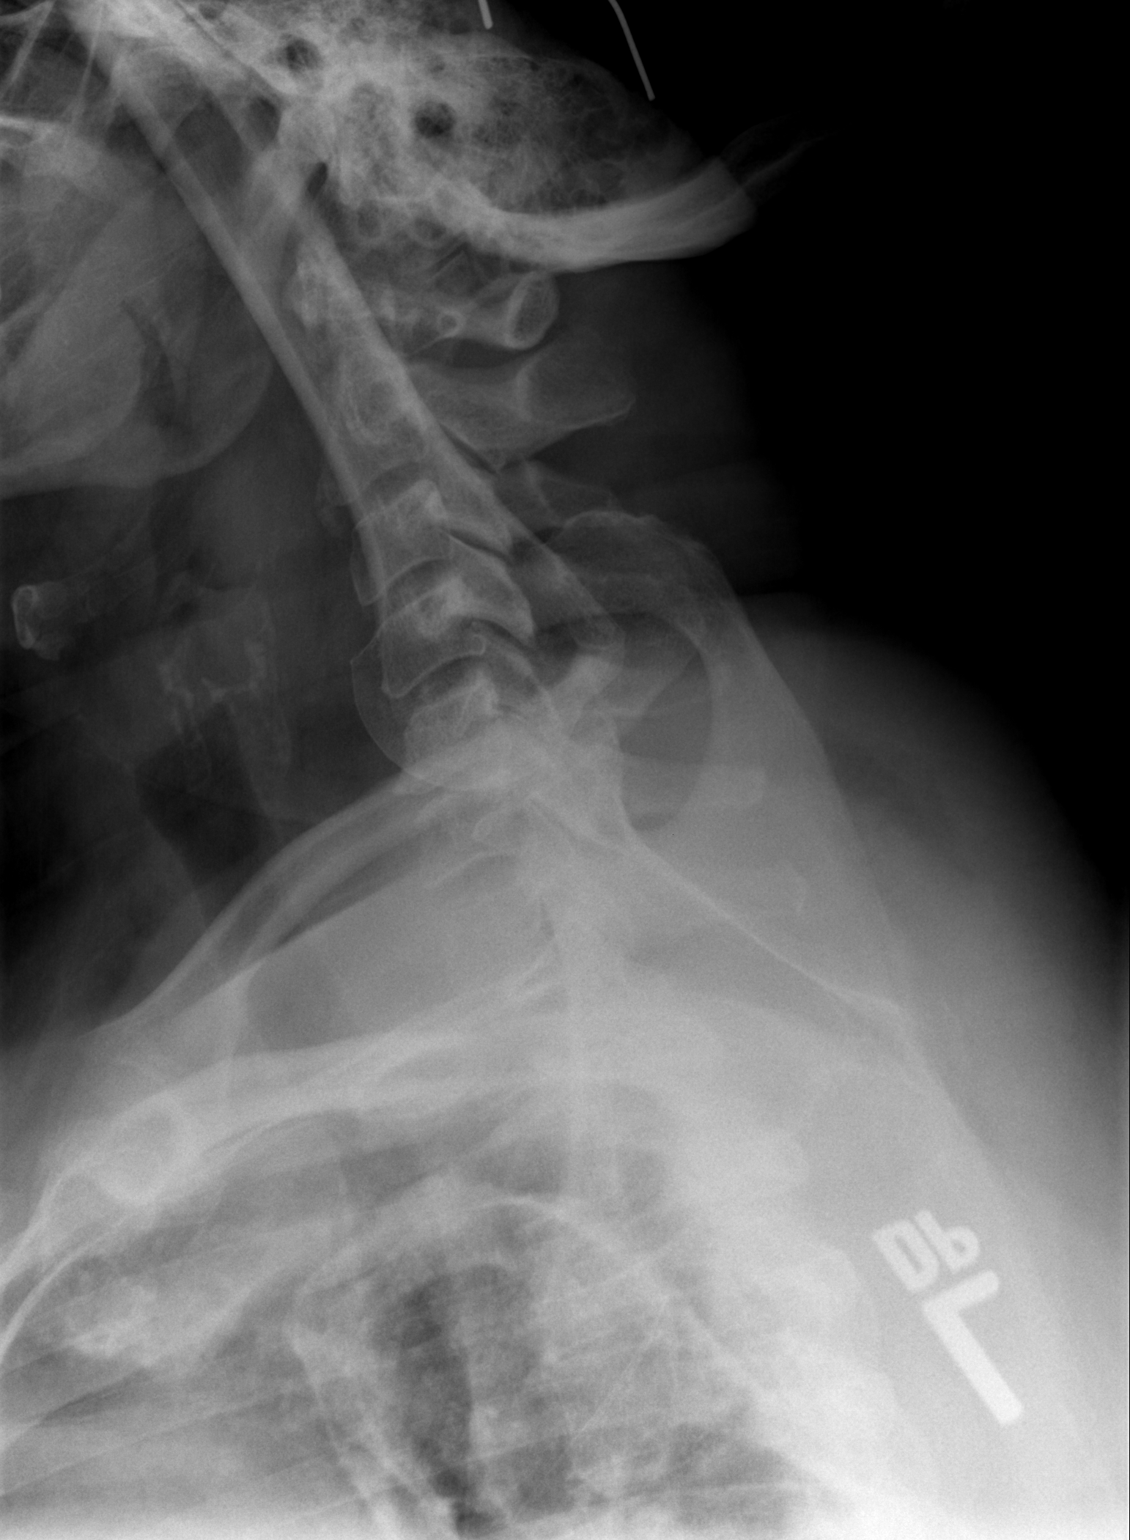

[3 of 3 positions shown; findings below may reference images not displayed]

FINDINGS: Two view radiograph of the thoracic spine demonstrates normal
thoracic kyphosis. No acute fracture or listhesis of the thoracic
spine. There is intervertebral disc space narrowing and endplate
remodeling throughout the mid and lower thoracic spine in keeping
with changes of moderate degenerative disc disease. The paraspinal
soft tissues are unremarkable.
IMPRESSION: Moderate degenerative disc disease of the mid and lower thoracic
spine.

## 2021-08-08 IMAGING — CR DG RIBS W/ CHEST 3+V*R*
3 series · 3 of 3 positions shown · non-contrast
Comparison: None.

CLINICAL DATA: 54-year-old female with back muscle spasms and
posterior back pain

EXAM:
RIGHT RIBS AND CHEST - 3+ VIEW

[w chest pa]
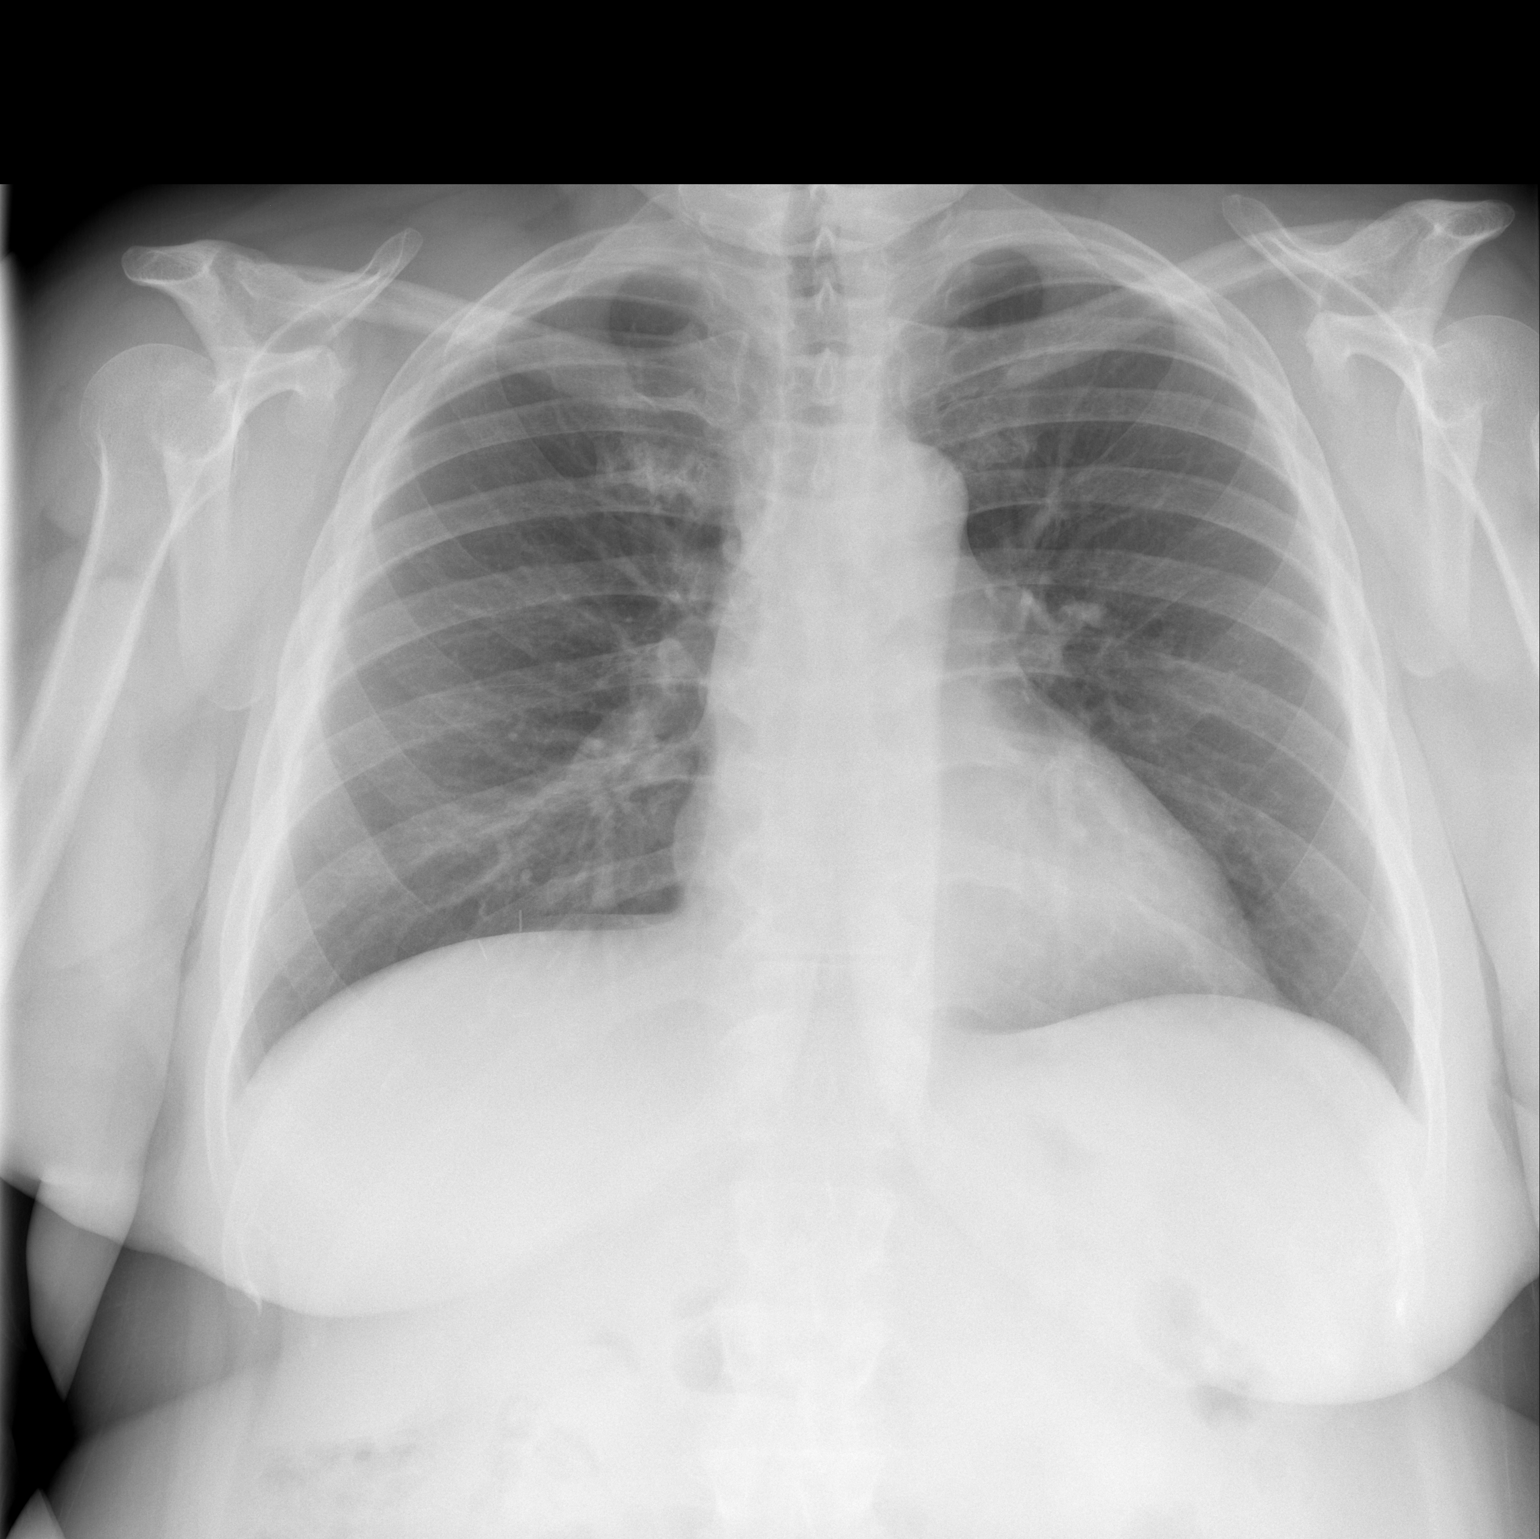

[w ribs ap/pa lower right *]
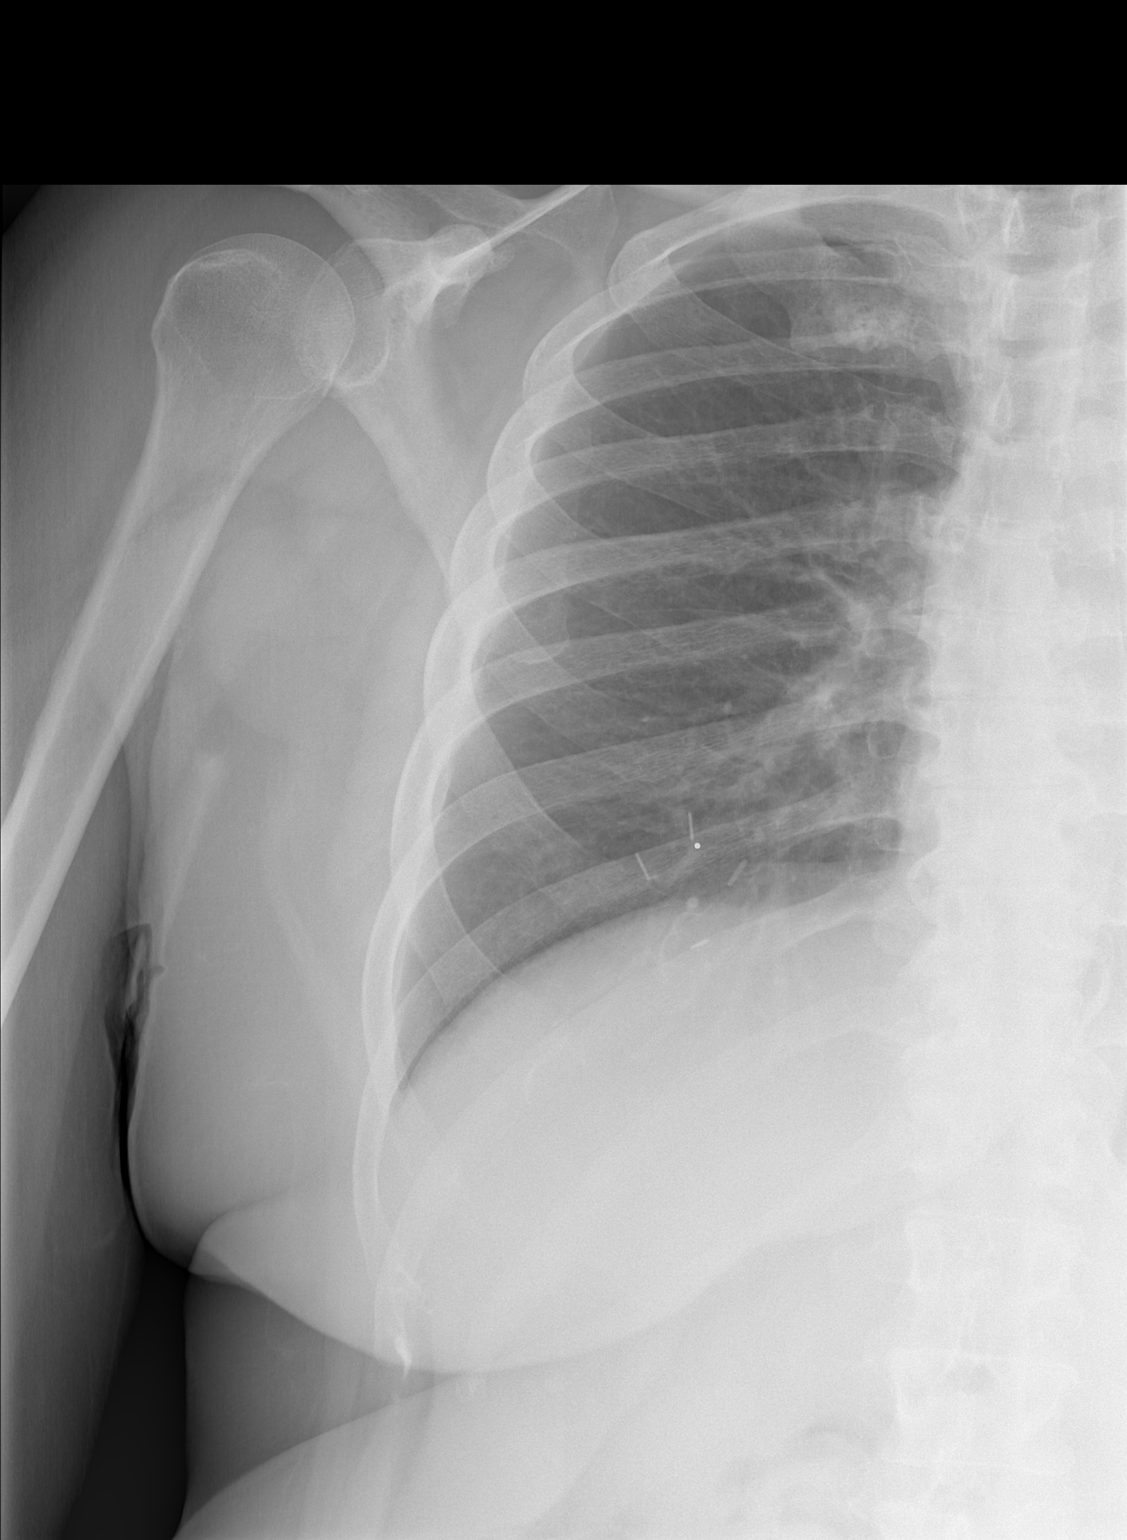

[w ribs oblique right *]
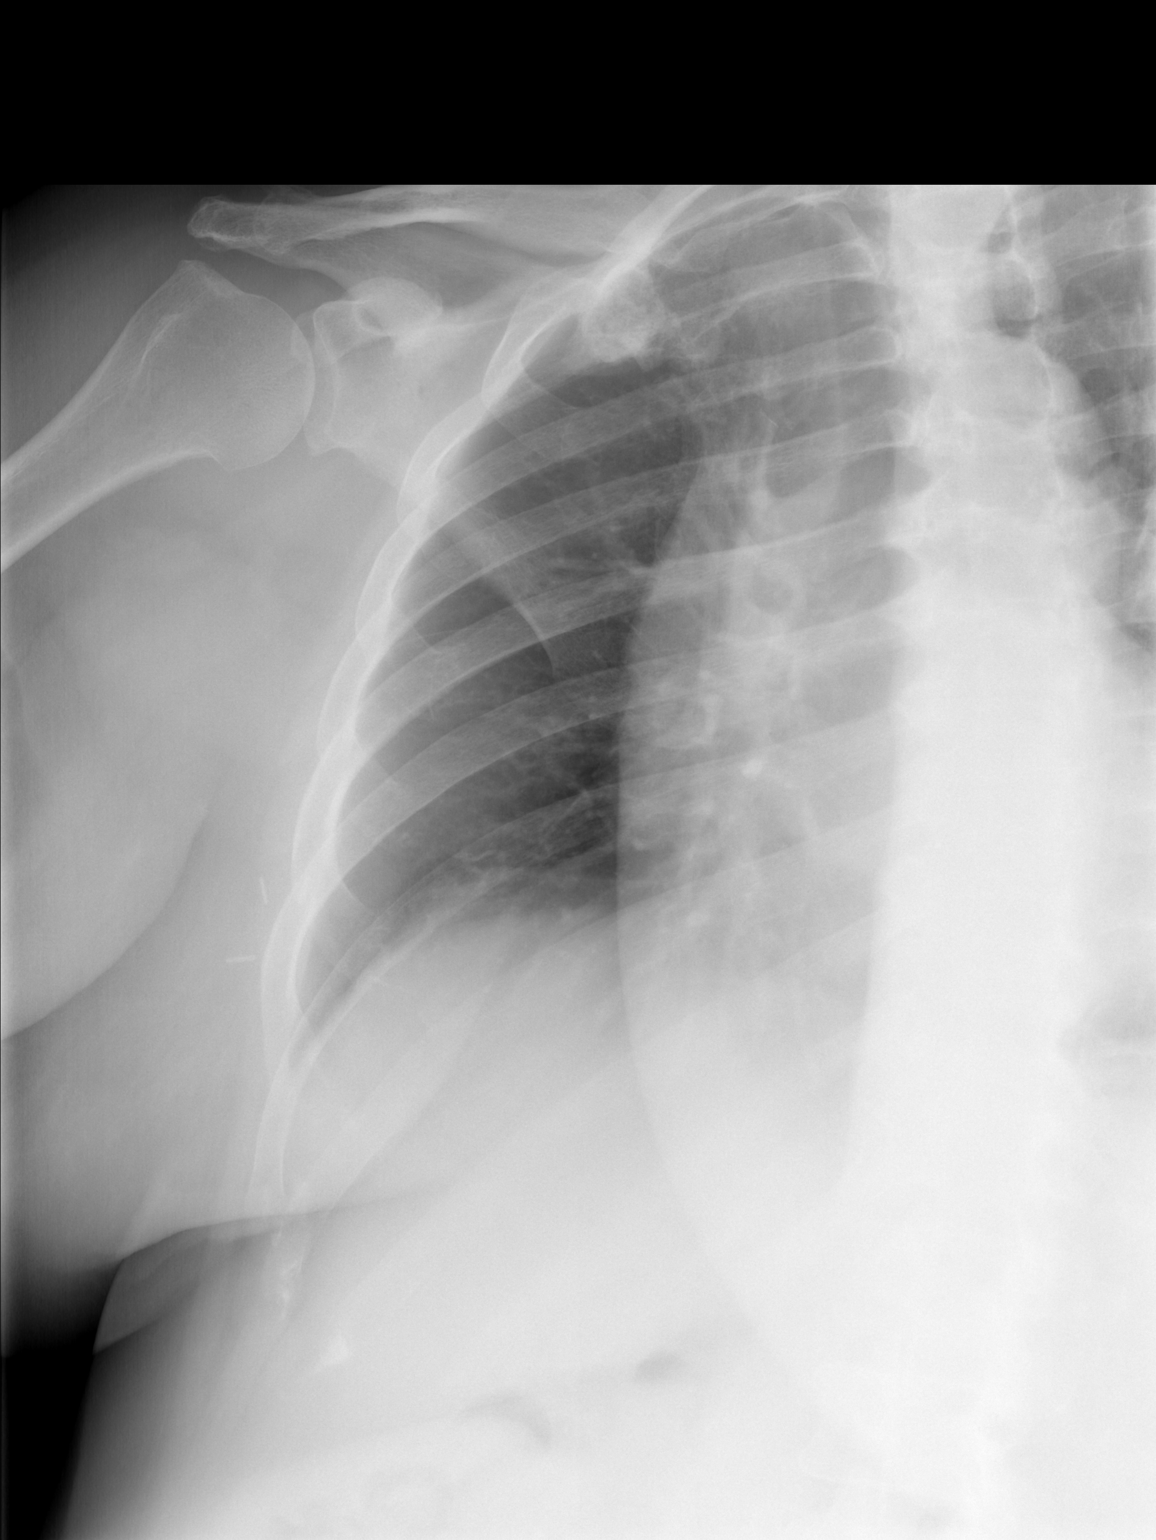

[3 of 3 positions shown; findings below may reference images not displayed]

FINDINGS: No fracture or other bone lesions are seen involving the ribs. There
is no evidence of pneumothorax or pleural effusion. Both lungs are
clear. Heart size and mediastinal contours are within normal limits.

Surgical changes of the anterior right chest/breast.
IMPRESSION: Negative.

## 2021-08-30 ENCOUNTER — Other Ambulatory Visit: Payer: Self-pay | Admitting: Adult Health

## 2021-08-30 DIAGNOSIS — Z9889 Other specified postprocedural states: Secondary | ICD-10-CM

## 2021-09-08 ENCOUNTER — Other Ambulatory Visit: Payer: Self-pay

## 2021-09-08 ENCOUNTER — Encounter (INDEPENDENT_AMBULATORY_CARE_PROVIDER_SITE_OTHER): Payer: Self-pay | Admitting: Family Medicine

## 2021-09-08 ENCOUNTER — Ambulatory Visit (INDEPENDENT_AMBULATORY_CARE_PROVIDER_SITE_OTHER): Payer: BC Managed Care – PPO | Admitting: Family Medicine

## 2021-09-08 VITALS — BP 128/78 | HR 78 | Temp 98.6°F | Ht 66.0 in | Wt 226.0 lb

## 2021-09-08 DIAGNOSIS — I10 Essential (primary) hypertension: Secondary | ICD-10-CM

## 2021-09-08 DIAGNOSIS — Z6836 Body mass index (BMI) 36.0-36.9, adult: Secondary | ICD-10-CM | POA: Diagnosis not present

## 2021-09-08 DIAGNOSIS — E669 Obesity, unspecified: Secondary | ICD-10-CM

## 2021-09-08 DIAGNOSIS — E7849 Other hyperlipidemia: Secondary | ICD-10-CM

## 2021-09-08 NOTE — Progress Notes (Signed)
Chief Complaint:   OBESITY [name] [name] is here to discuss her progress with her obesity treatment plan along with follow-up of her obesity related diagnoses. [name] [name] is on keeping a food journal and adhering to recommended goals of 1200-1300 calories and 80+ grams protein and states she is following her eating plan approximately 0% of the time. [name] [name] states she is going to the gym 60 minutes 5 times per week.  Today's visit was #: 18 Starting weight: 237 lbs Starting date: 08/26/2020 Today's weight: 225 lbs Today's date: 09/08/2021 Total lbs lost to date: 12 Total lbs lost since last in-office visit: 1  Interim History: Pt is returning to clinic since 06/30/2021. She has a significant family trauma last year so the holidays were difficult. Her birthday is coming up. Pt feels weight is plateauing. She reports no consistent hunger.  Subjective:   1. Essential hypertension BP well controlled. Pt denies chest pain/chest pressure/headache.  2. Other hyperlipidemia Pt has an LDL of 128, HDL 43, and triglycerides 77. She is on Crestor.  Assessment/Plan:   1. Essential hypertension [name] [name] is working on healthy weight loss and exercise to improve blood pressure control. We will watch for signs of hypotension as she continues her lifestyle modifications. Continue current treatment plan.  2. Other hyperlipidemia Cardiovascular risk and specific lipid/LDL goals reviewed.  We discussed several lifestyle modifications today and [name] [name] will continue to work on diet, exercise and weight loss efforts. Orders and follow up as documented in patient record. Repeat labs in March.  Counseling Intensive lifestyle modifications are the first line treatment for this issue. Dietary changes: Increase soluble fiber. Decrease simple carbohydrates. Exercise changes: Moderate to vigorous-intensity aerobic activity 150 minutes per week if tolerated. Lipid-lowering medications: see documented in  medical record.  3. Obesity with current BMI of 36.4  [name] [name] is currently in the action stage of change. As such, her goal is to continue with weight loss efforts. She has agreed to the Category 3 Plan.   Exercise goals: All adults should avoid inactivity. Some physical activity is better than none, and adults who participate in any amount of physical activity gain some health benefits. Transition to increasing resistance training.  Behavioral modification strategies: increasing lean protein intake, no skipping meals, and meal planning and cooking strategies.  [name] [name] has agreed to follow-up with our clinic in 3 weeks. She was informed of the importance of frequent follow-up visits to maximize her success with intensive lifestyle modifications for her multiple health conditions.   Objective:   Blood pressure 128/78, pulse 78, temperature 98.6 F (37 C), height 5\' 6"  (1.676 m), weight 226 lb (102.5 kg), last menstrual period 09/13/2015, SpO2 98 %. Body mass index is 36.48 kg/m.  General: Cooperative, alert, well developed, in no acute distress. HEENT: Conjunctivae and lids unremarkable. Cardiovascular: Regular rhythm.  Lungs: Normal work of breathing. Neurologic: No focal deficits.   Lab Results  Component Value Date   CREATININE 0.97 04/28/2021   BUN 11 04/28/2021   NA 140 04/28/2021   K 4.2 04/28/2021   CL 103 04/28/2021   CO2 25 04/28/2021   Lab Results  Component Value Date   ALT 12 04/28/2021   AST 17 04/28/2021   ALKPHOS 83 04/28/2021   BILITOT 0.6 04/28/2021   Lab Results  Component Value Date   HGBA1C 5.4 04/28/2021   HGBA1C 5.4 08/26/2020   Lab Results  Component Value Date   INSULIN 21.0 04/28/2021   INSULIN 20.0 08/26/2020   Lab Results  Component Value Date   TSH 1.440 08/26/2020   Lab Results  Component Value Date   CHOL 137 04/28/2021   HDL 43 04/28/2021   LDLCALC 79 04/28/2021   TRIG 77 04/28/2021   CHOLHDL 3.2 04/28/2021   Lab Results   Component Value Date   VD25OH 70.4 04/28/2021   VD25OH 46.9 08/26/2020   Lab Results  Component Value Date   WBC 4.6 04/28/2021   HGB 12.9 04/28/2021   HCT 40.1 04/28/2021   MCV 94 04/28/2021   PLT 129 (L) 04/28/2021   Lab Results  Component Value Date   IRON 76 04/27/2009   FERRITIN 30.7 04/27/2009   Attestation Statements:   Reviewed by clinician on day of visit: allergies, medications, problem list, medical history, surgical history, family history, social history, and previous encounter notes.  Coral Ceo, CMA, am acting as transcriptionist for Coralie Common, MD.   I have reviewed the above documentation for accuracy and completeness, and I agree with the above. - Coralie Common, MD

## 2021-09-09 ENCOUNTER — Ambulatory Visit: Payer: BC Managed Care – PPO | Admitting: Nurse Practitioner

## 2021-09-09 ENCOUNTER — Encounter: Payer: Self-pay | Admitting: Nurse Practitioner

## 2021-09-09 VITALS — BP 120/78 | HR 103 | Ht 65.0 in | Wt 231.0 lb

## 2021-09-09 DIAGNOSIS — K648 Other hemorrhoids: Secondary | ICD-10-CM

## 2021-09-09 MED ORDER — HYDROCORTISONE (PERIANAL) 2.5 % EX CREA: 1.0000 "application " | TOPICAL_CREAM | Freq: Every day | CUTANEOUS | 1 refills | Status: DC

## 2021-09-09 NOTE — Patient Instructions (Addendum)
We have sent the following medications to your pharmacy for you to pick up at your convenience: Hydrocortisone cream apply nightly for 10 days.   Will speak to Dr. Hilarie Fredrickson about banding.   If you are age 58 or older, your body mass index should be between 23-30. Your Body mass index is 38.44 kg/m. If this is out of the aforementioned range listed, please consider follow up with your Primary Care Provider.  If you are age 2 or younger, your body mass index should be between 19-25. Your Body mass index is 38.44 kg/m. If this is out of the aformentioned range listed, please consider follow up with your Primary Care Provider.   ________________________________________________________  The [facility] providers would like to encourage you to use [facility] to communicate with providers for non-urgent requests or questions.  Due to long hold times on the telephone, sending your provider a message by [employer] may be a faster and more efficient way to get a response.  Please allow 48 business hours for a response.  Please remember that this is for non-urgent requests.  _______________________________________________________

## 2021-09-09 NOTE — Progress Notes (Signed)
ASSESSMENT AND PLAN    # 56 yo female with history of internal hemorrhoid banding in 2016, 2021 and again in July 2022. Now with recurrent rectal discomfort, hemorrhoid prolapse, perianal itching. Large swollen internal hemorrhoids on anoscopy( possibly all 3 columns) but discomfort seems to be originating from anterior wall.  --I will discuss with Dr. Hilarie Fredrickson to see if he feels repeat banding would be helpful or refer for surgical evaluation.  --Anusol cream PR x 10 nights in the interim  --Trial of Balneol to to clean and soothe perianal area.   # CRC screening --normal colonoscopy in 2019, repeat would be in 10 years  HISTORY OF PRESENT ILLNESS    Chief Complaint : rectal discomfort  [name] [name] is a 56 y.o. female known to Dr. Hilarie Fredrickson with a past medical history of HTN, obesity , GERD , H. pylori infection (treated)) breast cancer status post right lumpectomy, chemotherapy/radiation , internal hemorrhoids status post banding in 2016,  2021 and July 2022,  Diverticulitis status post resection (remote) additional medical history as listed in [place] .   Patient is having recurrent rectal bulging/discomfort/itching.  She had internal hemorrhoid banding in 2016 and symptoms improved.   February 2021- having recurrent anal pruritis.  Internal hemorrhoids found on anoscopy .  Treated again with Anusol suppositories, dose increased to twice daily.  She was scheduled for another hemorrhoid banding   10/30/19 internal hemorrhoid banding. There were internal hemorrhoids, RA=LL>RP. There was no finding of an anorectal fissure. The rectal mucosa was not inflamed. No neoplasia or other pathology was identified. The inspection was well tolerated. The decision was made to band the RA internal hemorrhoid. Post-procedure developed pain in evening Spoke with on-call provider ( Dr. Carlean Purl). Pain relieved with sitz baths and OTC pain relievers.   11/24/19 -office follow-up after banding.   Her  hemorrhoid symptoms were better so additional banding was not done.    02/12/20 -office visit for persistent perianal itching.  Continued on fiber supplements, MiraLAX as needed.  [place] perianally as needed   07/01/20 - office visit for GERD symptoms.  Hemorrhoids are asymptomatic.   01/12/21 -follow-up on GERD symptoms.  Also occasional small amount of rectal bleeding, prolapsing internal hemorrhoids.  She wanted additional banding.   02/28/21 - banding visit. The decision was made to band the LL internal hemorrhoid, and the [place] was used to perform band ligation without complication  [birth year] office visit for leakage and rectal itching.  Continued on MiraLAX.  Small shallow posterior anal fissure was found and diltiazem gel prescribed.    INTERVAL HISTORY [name] [name] is here with recurrent perianal itching, hemorrhoid prolapse and some minor leakage of mucus. She feels a bulge between rectum / and vagina. She is frustrated about the recurrent / persistent anorectal discomfort.  She is able to manually reduce the bulge into the rectum at " the top of rectum" near the vagina.   The pressure sensation is worse in the sitting position, she feels okay when standing. Sometimes has to strain to have a BM. She hasn't had any rectal bleeding.   Taking miralax everyday. She has tried Tucks, Prep H supp ( didn't help). Currently using generic suppositories at night.    She has seen her GYN, does not sound like there was any concern for vaginal prolapse.     Hepatic Function Latest Ref Rng & Units 04/28/2021 08/26/2020 07/06/2020  Total Protein 6.0 - 8.5 g/dL 7.0 7.3 7.6  Albumin 3.8 -  4.9 g/dL 4.7 4.5 4.4  AST 0 - 40 IU/L '17 24 24  ' ALT 0 - 32 IU/L '12 22 18  ' Alk Phosphatase 44 - 121 IU/L 83 104 82  Total Bilirubin 0.0 - 1.2 mg/dL 0.6 0.5 0.4  Bilirubin, Direct 0.0 - 0.3 mg/dL - - -    CBC Latest Ref Rng & Units 04/28/2021 08/26/2020 07/06/2020  WBC 3.4 - 10.8 x10E3/uL 4.6 4.9 5.8  Hemoglobin  11.1 - 15.9 g/dL 12.9 13.5 12.8  Hematocrit 34.0 - 46.6 % 40.1 40.4 39.5  Platelets 150 - 450 x10E3/uL 129(L) 125(L) 134(L)    Lab Results  Component Value Date   LIPASE 27 07/06/2020     Current Medications, Allergies, Past Medical History, Past Surgical History, Family History and Social History were reviewed in [employer] record.     Current Outpatient Medications  Medication Sig Dispense Refill   AMBULATORY NON FORMULARY MEDICATION Medication Name: Diltiazem 2%/Lidocaine 2%   Using your index finger apply a small amount of medication inside the anal opening and to the external anal area three times daily x 6-8 weeks. 30 g 1   amLODipine (NORVASC) 5 MG tablet Take 5 mg by mouth daily.     Cholecalciferol (VITAMIN D) 50 MCG (2000 UT) CAPS Alternate 2000 IU and 1000 IU every other day. 30 capsule 0   clobetasol ointment (TEMOVATE) 3.49 % Apply 1 application topically 2 (two) times daily. Then as needed for 30 days  1   clotrimazole-betamethasone (LOTRISONE) cream Apply 1 application topically 2 (two) times daily. For 5-7 days 30 g 0   exemestane (AROMASIN) 25 MG tablet Take 1 tablet (25 mg total) by mouth daily after breakfast. 90 tablet 3   hydrocortisone (PROCTOSOL HC) 2.5 % rectal cream Place 1 application rectally daily. 30 g 0   losartan (COZAAR) 50 MG tablet Take 50 mg by mouth daily.     pantoprazole (PROTONIX) 40 MG tablet Take 1 tablet (40 mg total) by mouth daily. 30 tablet 1   rosuvastatin (CRESTOR) 10 MG tablet TAKE 1 TABLET(10 MG) BY MOUTH DAILY 30 tablet 5   No current facility-administered medications for this visit.    Review of Systems: No chest pain. No shortness of breath. No urinary complaints.   PHYSICAL EXAM :    Wt Readings from Last 3 Encounters:  09/09/21 231 lb (104.8 kg)  09/08/21 226 lb (102.5 kg)  07/07/21 221 lb (100.2 kg)    BP 120/78    Pulse (!) 103    Ht '5\' 5"'  (1.651 m)    Wt 231 lb (104.8 kg)    LMP 09/13/2015  Comment: celebate, BTL   BMI 38.44 kg/m  Constitutional:  Generally well appearing female in no acute distress. Psychiatric: Normal mood and affect. Behavior is normal. EENT: Pupils normal.  Conjunctivae are normal. No scleral icterus. Neck supple.  Cardiovascular: Normal rate, regular rhythm. No edema Pulmonary/chest: Effort normal and breath sounds normal. No wheezing, rales or rhonchi. Abdominal: Soft, nondistended, nontender. Bowel sounds active throughout. There are no masses palpable. No hepatomegaly. Rectal: No external hemorrhoids or fissure appreciated.  On DRE there is thickening and tenderness of anterior wall.   On anoscopy she has swollen internal hemorrhoids, hard to discern but possibly all 3 columns  Neurological: Alert and oriented to person place and time. Skin: Skin is warm and dry. No rashes noted.  Tye Savoy, NP  09/09/2021, 10:38 AM

## 2021-09-11 ENCOUNTER — Encounter: Payer: Self-pay | Admitting: Nurse Practitioner

## 2021-09-12 NOTE — Progress Notes (Signed)
Addendum: Reviewed and agree with assessment and management plan. Given previous attempts at hemorrhoidal banding which were only marginally effective for a relative short period of time I recommend surgical referral for evaluation and consideration of hemorrhoidectomy.  This would be more definitive for her recurrent symptoms. Mayra Brahm, Lajuan Lines, MD

## 2021-09-14 ENCOUNTER — Telehealth: Payer: Self-pay

## 2021-09-14 NOTE — Telephone Encounter (Signed)
Records, demographics and insurance information faxed to [facility].

## 2021-09-14 NOTE — Telephone Encounter (Signed)
-----   Message from Willia Craze, NP sent at 09/14/2021 12:43 PM EST ----- [name] [name], I called patient today and spoke with her about the next step for her persistent internal hemorrhoids.  She has been banded a few times.  She keeps getting recurrent symptoms.  Dr. Hilarie Fredrickson would like to send her for a surgical evaluation.  The patient is agreeable.  Would you please refer her to [facility]/colorectal surgery.  Thank you

## 2021-09-29 ENCOUNTER — Ambulatory Visit
Admission: RE | Admit: 2021-09-29 | Discharge: 2021-09-29 | Disposition: A | Discharge status: Home / Self Care | Payer: BC Managed Care – PPO | Source: Ambulatory Visit | Attending: Adult Health | Admitting: Adult Health

## 2021-09-29 DIAGNOSIS — Z9889 Other specified postprocedural states: Secondary | ICD-10-CM

## 2021-10-06 ENCOUNTER — Other Ambulatory Visit: Payer: Self-pay

## 2021-10-06 ENCOUNTER — Ambulatory Visit (INDEPENDENT_AMBULATORY_CARE_PROVIDER_SITE_OTHER): Payer: BC Managed Care – PPO | Admitting: Family Medicine

## 2021-10-06 ENCOUNTER — Encounter (INDEPENDENT_AMBULATORY_CARE_PROVIDER_SITE_OTHER): Payer: Self-pay | Admitting: Family Medicine

## 2021-10-06 ENCOUNTER — Other Ambulatory Visit: Payer: Self-pay | Admitting: Cardiology

## 2021-10-06 VITALS — BP 144/82 | HR 87 | Temp 97.8°F | Ht 66.0 in | Wt 228.0 lb

## 2021-10-06 DIAGNOSIS — Z6836 Body mass index (BMI) 36.0-36.9, adult: Secondary | ICD-10-CM | POA: Diagnosis not present

## 2021-10-06 DIAGNOSIS — I10 Essential (primary) hypertension: Secondary | ICD-10-CM | POA: Diagnosis not present

## 2021-10-06 DIAGNOSIS — E669 Obesity, unspecified: Secondary | ICD-10-CM

## 2021-10-06 DIAGNOSIS — E8881 Metabolic syndrome: Secondary | ICD-10-CM

## 2021-10-06 DIAGNOSIS — Z6838 Body mass index (BMI) 38.0-38.9, adult: Secondary | ICD-10-CM

## 2021-10-06 DIAGNOSIS — E782 Mixed hyperlipidemia: Secondary | ICD-10-CM

## 2021-10-06 NOTE — Progress Notes (Signed)
Chief Complaint:   OBESITY [name] [name] is here to discuss her progress with her obesity treatment plan along with follow-up of her obesity related diagnoses. [name] [name] is on the Category 3 Plan and states she is following her eating plan approximately 60% of the time. [name] [name] states she is going to the gym 60 minutes 3 times per week.  Today's visit was #: 75 Starting weight: 237 lbs Starting date: 08/26/2020 Today's weight: 228 lbs Today's date: 10/06/2021 Total lbs lost to date: 9 Total lbs lost since last in-office visit: 0  Interim History: Pt has been experiencing significant sxs of allergies in the last few weeks. She had itching, swelling, and inflammation. She received a steroid shot and 8 days of PO prednisone. Pt is feeling better after treatment. Breakfast is 3 eggs with [place] sausage with 2 pieces toast; Lunch is tuna pack with fruit and crackers; Getting 1/2 cup milk daily; Eating a sandwich at supper with 4 oz and mustard; Snack is 1 cup popcorn. Pt is going to see a Social worker at [employer].  Subjective:   1. Essential hypertension BP slightly elevated. Pt is s/p steroid shot and PO prednisone. She is on amlodipine and Cozaar.  2. Insulin resistance [name] [name]'s last A1c was 5.4 with an insulin level of 21.0. She reports significant cravings, especially after steroids.  Assessment/Plan:   1. Essential hypertension [name] [name] is working on healthy weight loss and exercise to improve blood pressure control. We will watch for signs of hypotension as she continues her lifestyle modifications. Continue current treatment plan with no change in dose.  2. Insulin resistance [name] [name] will continue to work on weight loss, exercise, and decreasing simple carbohydrates to help decrease the risk of diabetes. [name] [name] agreed to follow-up with [name] as directed to closely monitor her progress. Labs in March.  3. Obesity with current BMI of 36.8 [name] [name] is currently in the  action stage of change. As such, her goal is to continue with weight loss efforts. She has agreed to the Category 3 Plan.   Exercise goals: All adults should avoid inactivity. Some physical activity is better than none, and adults who participate in any amount of physical activity gain some health benefits.  Behavioral modification strategies: increasing lean protein intake, meal planning and cooking strategies, and planning for success.  [name] [name] has agreed to follow-up with our clinic in 3-4 weeks. She was informed of the importance of frequent follow-up visits to maximize her success with intensive lifestyle modifications for her multiple health conditions.   Objective:   Blood pressure (!) 144/82, pulse 87, temperature 97.8 F (36.6 C), height 5\' 6"  (1.676 m), weight 228 lb (103.4 kg), last menstrual period 09/13/2015, SpO2 98 %. Body mass index is 36.8 kg/m.  General: Cooperative, alert, well developed, in no acute distress. HEENT: Conjunctivae and lids unremarkable. Cardiovascular: Regular rhythm.  Lungs: Normal work of breathing. Neurologic: No focal deficits.   Lab Results  Component Value Date   CREATININE 0.97 04/28/2021   BUN 11 04/28/2021   NA 140 04/28/2021   K 4.2 04/28/2021   CL 103 04/28/2021   CO2 25 04/28/2021   Lab Results  Component Value Date   ALT 12 04/28/2021   AST 17 04/28/2021   ALKPHOS 83 04/28/2021   BILITOT 0.6 04/28/2021   Lab Results  Component Value Date   HGBA1C 5.4 04/28/2021   HGBA1C 5.4 08/26/2020   Lab Results  Component Value Date   INSULIN 21.0 04/28/2021   INSULIN 20.0  08/26/2020   Lab Results  Component Value Date   TSH 1.440 08/26/2020   Lab Results  Component Value Date   CHOL 137 04/28/2021   HDL 43 04/28/2021   LDLCALC 79 04/28/2021   TRIG 77 04/28/2021   CHOLHDL 3.2 04/28/2021   Lab Results  Component Value Date   VD25OH 70.4 04/28/2021   VD25OH 46.9 08/26/2020   Lab Results  Component Value Date   WBC  4.6 04/28/2021   HGB 12.9 04/28/2021   HCT 40.1 04/28/2021   MCV 94 04/28/2021   PLT 129 (L) 04/28/2021   Lab Results  Component Value Date   IRON 76 04/27/2009   FERRITIN 30.7 04/27/2009   Attestation Statements:   Reviewed by clinician on day of visit: allergies, medications, problem list, medical history, surgical history, family history, social history, and previous encounter notes.  Coral Ceo, CMA, am acting as transcriptionist for Coralie Common, MD. I have reviewed the above documentation for accuracy and completeness, and I agree with the above. - Coralie Common, MD

## 2021-10-10 NOTE — Progress Notes (Unsigned)
56 y.o. G3P3 Divorced Black or [place] American Not Hispanic or Latino female here for annual exam.      Patient's last menstrual period was 09/13/2015.          Sexually active: {yes no:314532}  The current method of family planning is {contraception:315051}.    Exercising: {yes no:314532}  {types:19826} Smoker:  {YES P5382123  Health Maintenance: Pap:  04-16-18 normal neg HPV History of abnormal Pap:  {YES NO:22349} MMG:  09-29-21 normal BMD:   10-15-19 normal Colonoscopy: 2019 normal TDaP:  *** Gardasil: ***   reports that she has never smoked. She has never used smokeless tobacco. She reports that she does not drink alcohol and does not use drugs.  Past Medical History:  Diagnosis Date   Allergy    seasonal   Anal fissure    Back pain    Breast cancer ([place]) 2019   R BREAST IDC   Complication of anesthesia    Constipation    Diverticulitis    Fatty liver    GERD (gastroesophageal reflux disease) 07/07/2005   Heartburn    Hemorrhoid    Hepatic steatosis    Hypertension    Internal hemorrhoids    Joint pain    Malignant neoplasm of lower-inner quadrant of right breast of female, estrogen receptor positive ([place]) 07/16/2018   Personal history of chemotherapy    Personal history of radiation therapy    PONV (postoperative nausea and vomiting)    Positive H. pylori test    SOBOE (shortness of breath on exertion)     Past Surgical History:  Procedure Laterality Date   BREAST BIOPSY Right 07/12/2018   R BREAST IDC   BREAST LUMPECTOMY Right 08/22/2018   IDC   BREAST LUMPECTOMY WITH RADIOACTIVE SEED AND SENTINEL LYMPH NODE BIOPSY Right 08/23/2018   Procedure: RIGHT BREAST LUMPECTOMY WITH RADIOACTIVE SEED AND RIGHT SENTINEL LYMPH NODE MAPPING;  Surgeon: Erroll Luna, MD;  Location: [place];  Service: General;  Laterality: Right;   COLON RESECTION  2000's Dr. Hassell Done   Diverticulitis.   COLONOSCOPY     FOOT SURGERY Left    g3p2 c-section1     KNEE SURGERY Right     PORT-A-CATH REMOVAL Right 04/15/2019   Procedure: REMOVAL PORT-A-CATH;  Surgeon: Erroll Luna, MD;  Location: [place];  Service: General;  Laterality: Right;  MAC AND LOCAL ANESTHETIC   PORTACATH PLACEMENT Right 09/24/2018   Procedure: INSERTION PORT-A-CATH WITH ULTRASOUND;  Surgeon: Erroll Luna, MD;  Location: [place];  Service: General;  Laterality: Right;   TONSILLECTOMY     TUBAL LIGATION      Current Outpatient Medications  Medication Sig Dispense Refill   AMBULATORY NON FORMULARY MEDICATION Medication Name: Diltiazem 2%/Lidocaine 2%   Using your index finger apply a small amount of medication inside the anal opening and to the external anal area three times daily x 6-8 weeks. 30 g 1   amLODipine (NORVASC) 5 MG tablet Take 5 mg by mouth daily.     azelastine (OPTIVAR) 0.05 % ophthalmic solution Place 1 drop into both eyes 2 (two) times daily.     cetirizine (ZYRTEC) 10 MG tablet Take 10 mg by mouth daily.     Cholecalciferol (VITAMIN D) 50 MCG (2000 UT) CAPS Alternate 2000 IU and 1000 IU every other day. 30 capsule 0   clobetasol ointment (TEMOVATE) 3.64 % Apply 1 application topically 2 (two) times daily. Then as needed for 30 days  1   clotrimazole-betamethasone (  LOTRISONE) cream Apply 1 application topically 2 (two) times daily. For 5-7 days 30 g 0   exemestane (AROMASIN) 25 MG tablet Take 1 tablet (25 mg total) by mouth daily after breakfast. 90 tablet 3   fluticasone (FLONASE) 50 MCG/ACT nasal spray Place 2 sprays into both nostrils daily.     hydrocortisone (PROCTOSOL HC) 2.5 % rectal cream Place 1 application rectally daily. 30 g 1   hydrOXYzine (ATARAX) 25 MG tablet Take 25 mg by mouth 3 (three) times daily as needed.     losartan (COZAAR) 50 MG tablet Take 50 mg by mouth daily.     montelukast (SINGULAIR) 10 MG tablet Take 10 mg by mouth at bedtime.     pantoprazole (PROTONIX) 40 MG tablet Take 1 tablet (40 mg total) by mouth daily. 30  tablet 1   rosuvastatin (CRESTOR) 10 MG tablet TAKE 1 TABLET(10 MG) BY MOUTH DAILY 30 tablet 5   No current facility-administered medications for this visit.    Family History  Problem Relation Age of Onset   Hypertension Mother    Breast cancer Mother 29   Cancer Mother    Alcoholism Mother    Drug abuse Mother    Hypertension Father    Heart disease Father    Heart attack Father    Hypertension Brother    Diabetes Neg Hx    COPD Neg Hx    Colon cancer Neg Hx    Stomach cancer Neg Hx    Pancreatic cancer Neg Hx    Esophageal cancer Neg Hx    Rectal cancer Neg Hx     Review of Systems  Exam:   LMP 09/13/2015 Comment: celebate, BTL  Weight change: _0 @ Height:      Ht Readings from Last 3 Encounters:  10/06/21 _1  (1.676 m)  09/09/21 _2  (1.651 m)  09/08/21 _3  (1.676 m)    General appearance: alert, cooperative and appears stated age Head: Normocephalic, without obvious abnormality, atraumatic Neck: no adenopathy, supple, symmetrical, trachea midline and thyroid {CHL AMB PHY EX THYROID NORM DEFAULT:[phone]::"normal to inspection and palpation"} Lungs: clear to auscultation bilaterally Cardiovascular: regular rate and rhythm Breasts: {Exam; breast:13139::"normal appearance, no masses or tenderness"} Abdomen: soft, non-tender; non distended,  no masses,  no organomegaly Extremities: extremities normal, atraumatic, no cyanosis or edema Skin: Skin color, texture, turgor normal. No rashes or lesions Lymph nodes: Cervical, supraclavicular, and axillary nodes normal. No abnormal inguinal nodes palpated Neurologic: Grossly normal   Pelvic: External genitalia:  no lesions              Urethra:  normal appearing urethra with no masses, tenderness or lesions              Bartholins and Skenes: normal                 Vagina: normal appearing vagina with normal color and discharge, no lesions              Cervix: {CHL AMB PHY EX CERVIX NORM  DEFAULT:[phone]::"no lesions"}               Bimanual Exam:  Uterus:  {CHL AMB PHY EX UTERUS NORM DEFAULT:[phone]::"normal size, contour, position, consistency, mobility, non-tender"}              Adnexa: {CHL AMB PHY EX ADNEXA NO MASS [phone]::"no mass, fullness, tenderness"}               Rectovaginal: Confirms  Anus:  normal sphincter tone, no lesions  *** chaperoned for the exam.  A:  Well Woman with normal exam  P:

## 2021-10-14 ENCOUNTER — Encounter: Payer: BC Managed Care – PPO | Admitting: Obstetrics and Gynecology

## 2021-10-17 NOTE — Progress Notes (Signed)
Patient Care Team: [name], [name], MD as PCP - General (Family Medicine) Erroll Luna, MD as Consulting Physician (General Surgery) Nicholas Lose, MD as Consulting Physician (Hematology and Oncology) Kyung Rudd, MD as Consulting Physician (Radiation Oncology)  DIAGNOSIS:    ICD-10-CM   1. Malignant neoplasm of lower-inner quadrant of right breast of female, estrogen receptor positive ([place])  C50.311    Z17.0       SUMMARY OF ONCOLOGIC HISTORY: Oncology History  Malignant neoplasm of lower-inner quadrant of right breast of female, estrogen receptor positive ([place])  07/16/2018 Initial Diagnosis   Pain right breast UOQ: Negative on mammogram, incidentally found right breast mass LIQ at 3:30 position measuring 0.6 cm biopsy revealed grade 3 IDC ER 40%, PR 0%, HER-2 negative, Ki-67 50%, T1bN0 stage Ib   07/24/2018 Cancer Staging   Staging form: Breast, AJCC 8th Edition - Clinical: Stage IB (cT1b, cN0, cM0, G3, ER+, PR-, HER2-) - Signed by Nicholas Lose, MD on 07/24/2018    08/23/2018 Surgery   Right lumpectomy: IDC, 0.7 cm, grade 3, margins negative, 0/3 lymph nodes negative, ER 40%, PR 0%, HER-2 negative, Ki-67 50%, T1BN0 stage Ia   08/23/2018 Oncotype testing   45/33%   09/04/2018 Cancer Staging   Staging form: Breast, AJCC 8th Edition - Pathologic: Stage IA (pT1b, pN0, cM0, G3, ER+, PR-, HER2-) - Signed by Gardenia Phlegm, NP on 09/04/2018    09/25/2018 - 01/22/2019 Chemotherapy   Adjuvant chemotherapy: completed 4 cycles dose dense Adriamycin and Cytoxan, completed 10 cycles of Taxol, stopped early due to neuropathy    02/19/2019 - 04/07/2019 Radiation Therapy   Adjuvant XRT The patient initially received a dose of 50.4 Gy in 28 fractions to the breast using whole-breast tangent fields. This was delivered using a 3-D conformal technique. The patient then received a boost to the seroma. This delivered an additional 10 Gy in 5 fractions using a 3-field photon boost technique.  The total dose was 60.4 Gy.   04/2019 -  Anti-estrogen oral therapy   Anastrozole switched to letrozole switched to Exemestane      CHIEF COMPLIANT: Follow-up of breast cancer on exemestane  INTERVAL HISTORY: [name] [name] is a 56 y.o. with above-mentioned history of breast cancer who underwent a right lumpectomy, adjuvant chemotherapy, radiation, and is currently on antiestrogen therapy with exemestane. Mammogram on 09/29/2021 showed of evidence of malignancy bilaterally. She presents to the clinic today for follow-up.  She is tolerating exemestane moderately well.  She does still have back pains intermittently.  There is spasm underneath the right breast especially when she leans forward or moves in a different direction.  Other than that she denies any lumps or nodules.  ALLERGIES:  is allergic to ibuprofen, lisinopril, dilaudid [hydromorphone hcl], and tape.  MEDICATIONS:  Current Outpatient Medications  Medication Sig Dispense Refill   AMBULATORY NON FORMULARY MEDICATION Medication Name: Diltiazem 2%/Lidocaine 2%   Using your index finger apply a small amount of medication inside the anal opening and to the external anal area three times daily x 6-8 weeks. 30 g 1   amLODipine (NORVASC) 5 MG tablet Take 5 mg by mouth daily.     azelastine (OPTIVAR) 0.05 % ophthalmic solution Place 1 drop into both eyes 2 (two) times daily.     cetirizine (ZYRTEC) 10 MG tablet Take 10 mg by mouth daily.     Cholecalciferol (VITAMIN D) 50 MCG (2000 UT) CAPS Alternate 2000 IU and 1000 IU every other day. 30 capsule 0  clobetasol ointment (TEMOVATE) 4.73 % Apply 1 application topically 2 (two) times daily. Then as needed for 30 days  1   clotrimazole-betamethasone (LOTRISONE) cream Apply 1 application topically 2 (two) times daily. For 5-7 days 30 g 0   exemestane (AROMASIN) 25 MG tablet Take 1 tablet (25 mg total) by mouth daily after breakfast. 90 tablet 3   fluticasone (FLONASE) 50 MCG/ACT  nasal spray Place 2 sprays into both nostrils daily.     hydrocortisone (PROCTOSOL HC) 2.5 % rectal cream Place 1 application rectally daily. 30 g 1   hydrOXYzine (ATARAX) 25 MG tablet Take 25 mg by mouth 3 (three) times daily as needed.     losartan (COZAAR) 50 MG tablet Take 50 mg by mouth daily.     montelukast (SINGULAIR) 10 MG tablet Take 10 mg by mouth at bedtime.     pantoprazole (PROTONIX) 40 MG tablet Take 1 tablet (40 mg total) by mouth daily. 30 tablet 1   rosuvastatin (CRESTOR) 10 MG tablet TAKE 1 TABLET(10 MG) BY MOUTH DAILY 30 tablet 5   No current facility-administered medications for this visit.    PHYSICAL EXAMINATION: ECOG PERFORMANCE STATUS: 1 - Symptomatic but completely ambulatory  Vitals:   10/18/21 1020  BP: (!) 163/95  Pulse: (!) 107  Resp: 18  Temp: 97.6 F (36.4 C)  SpO2: 100%   Filed Weights   10/18/21 1020  Weight: 234 lb 3.2 oz (106.2 kg)    BREAST: No palpable masses or nodules in either right or left breasts. No palpable axillary supraclavicular or infraclavicular adenopathy no breast tenderness or nipple discharge. (exam performed in the presence of a chaperone)  LABORATORY DATA:  I have reviewed the data as listed CMP Latest Ref Rng & Units 04/28/2021 08/26/2020 07/06/2020  Glucose 65 - 99 mg/dL 84 76 103(H)  BUN 6 - 24 mg/dL '11 12 12  ' Creatinine 0.57 - 1.00 mg/dL 0.97 0.85 0.96  Sodium 134 - 144 mmol/L 140 142 138  Potassium 3.5 - 5.2 mmol/L 4.2 4.2 3.9  Chloride 96 - 106 mmol/L 103 102 104  CO2 20 - 29 mmol/L '25 27 27  ' Calcium 8.7 - 10.2 mg/dL 9.8 9.6 9.3  Total Protein 6.0 - 8.5 g/dL 7.0 7.3 7.6  Total Bilirubin 0.0 - 1.2 mg/dL 0.6 0.5 0.4  Alkaline Phos 44 - 121 IU/L 83 104 82  AST 0 - 40 IU/L '17 24 24  ' ALT 0 - 32 IU/L '12 22 18    ' Lab Results  Component Value Date   WBC 4.6 04/28/2021   HGB 12.9 04/28/2021   HCT 40.1 04/28/2021   MCV 94 04/28/2021   PLT 129 (L) 04/28/2021   NEUTROABS 3.0 04/28/2021    ASSESSMENT & PLAN:   Malignant neoplasm of lower-inner quadrant of right breast of female, estrogen receptor positive ([place]) 08/23/2018:Right lumpectomy: IDC, 0.7 cm, grade 3, margins negative, 0/3 lymph nodes negative, ER 40%, PR 0%, HER-2 negative, Ki-67 50%, T1BN0 stage Ia   Oncotype Dx 44: High Risk   Treatment plan: 1. Adj Chemo with Dose dense adriamycin and Cytoxan foll by Taxol weekly X 10 stopped early for toxicities 2. Adjuvant radiation therapy 02/20/2019 -03/28/2019  3. Adjuvant antiestrogen therapy ------------------------------------------------------------------------------------------------------------ Port will be removed 04/15/2019 Treatment plan: Adjuvant antiestrogen therapy with anastrozole 1 mg daily started September 2020, switched to letrozole, switching to exemestane started 08/21/2020   Severe back pain issues: Improving after she stopped letrozole but still she experiences some of this even on exemestane.  Exemestane Toxicities:  Intermittent muscle spasms especially underneath her breasts.   Breast cancer surveillance: 1.  Breast exam 10/10/2021: Benign 2. Mammogram 09/29/2021: Benign breast density category B  RTC in 1 year   No orders of the defined types were placed in this encounter.  The patient has a good understanding of the overall plan. she agrees with it. she will call with any problems that may develop before the next visit here.  Total time spent: 20 mins including face to face time and time spent for planning, charting and coordination of care  Rulon Eisenmenger, MD, MPH 10/18/2021  I, Thana Ates, am acting as scribe for Dr. Nicholas Lose.  I have reviewed the above documentation for accuracy and completeness, and I agree with the above.

## 2021-10-18 ENCOUNTER — Other Ambulatory Visit: Payer: Self-pay

## 2021-10-18 ENCOUNTER — Inpatient Hospital Stay: Payer: BC Managed Care – PPO | Attending: Hematology and Oncology | Admitting: Hematology and Oncology

## 2021-10-18 DIAGNOSIS — Z170 Estrogen receptor positive status [ER+]: Secondary | ICD-10-CM | POA: Diagnosis not present

## 2021-10-18 DIAGNOSIS — Z79811 Long term (current) use of aromatase inhibitors: Secondary | ICD-10-CM | POA: Insufficient documentation

## 2021-10-18 DIAGNOSIS — Z9221 Personal history of antineoplastic chemotherapy: Secondary | ICD-10-CM | POA: Insufficient documentation

## 2021-10-18 DIAGNOSIS — Z923 Personal history of irradiation: Secondary | ICD-10-CM | POA: Diagnosis not present

## 2021-10-18 DIAGNOSIS — C50311 Malignant neoplasm of lower-inner quadrant of right female breast: Secondary | ICD-10-CM | POA: Diagnosis not present

## 2021-10-18 MED ORDER — EXEMESTANE 25 MG PO TABS: 25.0000 mg | ORAL_TABLET | Freq: Every day | ORAL | 3 refills | Status: DC

## 2021-10-18 NOTE — Assessment & Plan Note (Signed)
08/23/2018:Right lumpectomy: IDC, 0.7 cm, grade 3, margins negative, 0/3 lymph nodes negative, ER 40%, PR 0%, HER-2 negative, Ki-67 50%, T1BN0 stage Ia  Oncotype Dx 44: High Risk  Treatment plan: 1.Adj Chemo with Dose dense adriamycin and Cytoxan foll by Taxol weekly X10stopped early for toxicities 2. Adjuvant radiation therapy7/09/2018-03/28/2019 3. Adjuvant antiestrogen therapy ------------------------------------------------------------------------------------------------------------ Port will be removed 04/15/2019 Treatment plan: Adjuvant antiestrogen therapy with anastrozole 1 mg daily, switched to letrozole, switching to exemestane to start 08/21/2020  Severe back pain issues: Improving after she stopped letrozole  Exemestane Toxicities:  Intermittent muscle spasms  Breast cancer surveillance: 1.  Breast exam 10/10/2021: Benign 2. Mammogram 09/29/2021: Benign breast density category B  RTC in 1 year

## 2021-10-21 ENCOUNTER — Other Ambulatory Visit: Payer: Self-pay | Admitting: Internal Medicine

## 2021-10-21 DIAGNOSIS — M62830 Muscle spasm of back: Secondary | ICD-10-CM

## 2021-10-21 DIAGNOSIS — L309 Dermatitis, unspecified: Secondary | ICD-10-CM

## 2021-10-21 DIAGNOSIS — L430 Hypertrophic lichen planus: Secondary | ICD-10-CM

## 2021-10-21 DIAGNOSIS — E669 Obesity, unspecified: Secondary | ICD-10-CM

## 2021-10-21 DIAGNOSIS — K219 Gastro-esophageal reflux disease without esophagitis: Secondary | ICD-10-CM

## 2021-10-21 DIAGNOSIS — I10 Essential (primary) hypertension: Secondary | ICD-10-CM

## 2021-10-21 DIAGNOSIS — M545 Low back pain, unspecified: Secondary | ICD-10-CM

## 2021-10-21 DIAGNOSIS — E538 Deficiency of other specified B group vitamins: Secondary | ICD-10-CM

## 2021-10-25 ENCOUNTER — Encounter: Payer: BC Managed Care – PPO | Admitting: Obstetrics and Gynecology

## 2021-10-25 NOTE — Progress Notes (Deleted)
56 y.o. G3P3 Divorced Black or [place] American Not Hispanic or Latino female here for .   ?  ? ?Patient's last menstrual period was 09/13/2015.          ?Sexually active: {yes no:314532}  ?The current method of family planning is {contraception:315051}.    ?Exercising: {yes no:314532}  {types:19826} ?Smoker:  {YES NO:22349} ? ?Health Maintenance: ?Pap:  8.27/19  WNL Hr HPV Neg 03/10/16 WNL  ?History of abnormal Pap:  {YES NO:22349} ?MMG:  09/29/21 density B Bi-rads 1 neg  ?BMD:   10/15/19 normal  ?Colonoscopy: 08/28/17 f/u 10 years  ?TDaP:  *** ?Gardasil: none  ? ? reports that she has never smoked. She has never used smokeless tobacco. She reports that she does not drink alcohol and does not use drugs. ? ?Past Medical History:  ?Diagnosis Date  ? Allergy   ? seasonal  ? Anal fissure   ? Back pain   ? Breast cancer ([place]) 2019  ? R BREAST IDC  ? Complication of anesthesia   ? Constipation   ? Diverticulitis   ? Fatty liver   ? GERD (gastroesophageal reflux disease) 07/07/2005  ? Heartburn   ? Hemorrhoid   ? Hepatic steatosis   ? Hypertension   ? Internal hemorrhoids   ? Joint pain   ? Malignant neoplasm of lower-inner quadrant of right breast of female, estrogen receptor positive ([place]) 07/16/2018  ? Personal history of chemotherapy   ? Personal history of radiation therapy   ? PONV (postoperative nausea and vomiting)   ? Positive H. pylori test   ? SOBOE (shortness of breath on exertion)   ? ? ?Past Surgical History:  ?Procedure Laterality Date  ? BREAST BIOPSY Right 07/12/2018  ? R BREAST IDC  ? BREAST LUMPECTOMY Right 08/22/2018  ? IDC  ? BREAST LUMPECTOMY WITH RADIOACTIVE SEED AND SENTINEL LYMPH NODE BIOPSY Right 08/23/2018  ? Procedure: RIGHT BREAST LUMPECTOMY WITH RADIOACTIVE SEED AND RIGHT SENTINEL LYMPH NODE MAPPING;  Surgeon: Erroll Luna, MD;  Location: [facility];  Service: General;  Laterality: Right;  ? COLON RESECTION  2000's Dr. Hassell Done  ? Diverticulitis.  ? COLONOSCOPY    ? FOOT SURGERY Left   ? g3p2 c-section1     ? KNEE SURGERY Right   ? PORT-A-CATH REMOVAL Right 04/15/2019  ? Procedure: REMOVAL PORT-A-CATH;  Surgeon: Erroll Luna, MD;  Location: [facility];  Service: General;  Laterality: Right;  MAC AND LOCAL ANESTHETIC  ? PORTACATH PLACEMENT Right 09/24/2018  ? Procedure: INSERTION PORT-A-CATH WITH ULTRASOUND;  Surgeon: Erroll Luna, MD;  Location: [facility];  Service: General;  Laterality: Right;  ? TONSILLECTOMY    ? TUBAL LIGATION    ? ? ?Current Outpatient Medications  ?Medication Sig Dispense Refill  ? AMBULATORY NON FORMULARY MEDICATION Medication Name: Diltiazem 2%/Lidocaine 2% ? ? Using your index finger apply a small amount of medication inside the anal opening and to the external anal area three times daily x 6-8 weeks. 30 g 1  ? amLODipine (NORVASC) 5 MG tablet Take 5 mg by mouth daily.    ? azelastine (OPTIVAR) 0.05 % ophthalmic solution Place 1 drop into both eyes 2 (two) times daily.    ? cetirizine (ZYRTEC) 10 MG tablet Take 10 mg by mouth daily.    ? Cholecalciferol (VITAMIN D) 50 MCG (2000 UT) CAPS Alternate 2000 IU and 1000 IU every other day. 30 capsule 0  ? clobetasol ointment (TEMOVATE) 9.52 % Apply 1 application topically 2 (  two) times daily. Then as needed for 30 days  1  ? clotrimazole-betamethasone (LOTRISONE) cream Apply 1 application topically 2 (two) times daily. For 5-7 days 30 g 0  ? exemestane (AROMASIN) 25 MG tablet Take 1 tablet (25 mg total) by mouth daily after breakfast. 90 tablet 3  ? fluticasone (FLONASE) 50 MCG/ACT nasal spray Place 2 sprays into both nostrils daily.    ? hydrocortisone (PROCTOSOL HC) 2.5 % rectal cream Place 1 application rectally daily. 30 g 1  ? hydrOXYzine (ATARAX) 25 MG tablet Take 25 mg by mouth 3 (three) times daily as needed.    ? losartan (COZAAR) 50 MG tablet Take 50 mg by mouth daily.    ? montelukast (SINGULAIR) 10 MG tablet Take 10 mg by mouth at bedtime.    ? pantoprazole (PROTONIX) 40 MG tablet Take 1 tablet (40 mg  total) by mouth daily. 30 tablet 1  ? rosuvastatin (CRESTOR) 10 MG tablet TAKE 1 TABLET(10 MG) BY MOUTH DAILY 30 tablet 5  ? ?No current facility-administered medications for this visit.  ? ? ?Family History  ?Problem Relation Age of Onset  ? Hypertension Mother   ? Breast cancer Mother 16  ? Cancer Mother   ? Alcoholism Mother   ? Drug abuse Mother   ? Hypertension Father   ? Heart disease Father   ? Heart attack Father   ? Hypertension Brother   ? Diabetes Neg Hx   ? COPD Neg Hx   ? Colon cancer Neg Hx   ? Stomach cancer Neg Hx   ? Pancreatic cancer Neg Hx   ? Esophageal cancer Neg Hx   ? Rectal cancer Neg Hx   ? ? ?Review of Systems ? ?Exam:   ?LMP 09/13/2015 Comment: celebate, BTL  Weight change: _0 @ Height:      ?Ht Readings from Last 3 Encounters:  ?10/18/21 _1  (1.676 m)  ?10/06/21 _2  (1.676 m)  ?09/09/21 _3  (1.651 m)  ? ? ?General appearance: alert, cooperative and appears stated age ?Head: Normocephalic, without obvious abnormality, atraumatic ?Neck: no adenopathy, supple, symmetrical, trachea midline and thyroid {CHL AMB PHY EX THYROID NORM [phone]::"normal to inspection and palpation"} ?Lungs: clear to auscultation bilaterally ?Cardiovascular: regular rate and rhythm ?Breasts: {Exam; breast:13139::"normal appearance, no masses or tenderness"} ?Abdomen: soft, non-tender; non distended,  no masses,  no organomegaly ?Extremities: extremities normal, atraumatic, no cyanosis or edema ?Skin: Skin color, texture, turgor normal. No rashes or lesions ?Lymph nodes: Cervical, supraclavicular, and axillary nodes normal. ?No abnormal inguinal nodes palpated ?Neurologic: Grossly normal ? ? ?Pelvic: External genitalia:  no lesions ?             Urethra:  normal appearing urethra with no masses, tenderness or lesions ?             Bartholins and Skenes: normal    ?             Vagina: normal appearing vagina with normal color and discharge, no lesions ?             Cervix: {CHL AMB PHY EX  CERVIX NORM DEFAULT:[id number]::"no lesions"} ?              ?Bimanual Exam:  Uterus:  {CHL AMB PHY EX UTERUS NORM [phone]::"normal size, contour, position, consistency, mobility, non-tender"} ?             Adnexa: {CHL AMB PHY EX ADNEXA NO MASS DEFAULT:[phone]::"no mass, fullness, tenderness"} ?  Rectovaginal: Confirms ?              Anus:  normal sphincter tone, no lesions ? ?*** chaperoned for the exam. ? ?A:  Well Woman with normal exam ? ?P:    ? ? ? ?

## 2021-10-26 ENCOUNTER — Other Ambulatory Visit: Payer: Self-pay | Admitting: Internal Medicine

## 2021-10-26 DIAGNOSIS — E538 Deficiency of other specified B group vitamins: Secondary | ICD-10-CM

## 2021-10-26 DIAGNOSIS — M5416 Radiculopathy, lumbar region: Secondary | ICD-10-CM

## 2021-10-26 DIAGNOSIS — K219 Gastro-esophageal reflux disease without esophagitis: Secondary | ICD-10-CM

## 2021-10-26 DIAGNOSIS — I10 Essential (primary) hypertension: Secondary | ICD-10-CM

## 2021-10-26 DIAGNOSIS — L309 Dermatitis, unspecified: Secondary | ICD-10-CM

## 2021-10-26 DIAGNOSIS — M62830 Muscle spasm of back: Secondary | ICD-10-CM

## 2021-10-27 ENCOUNTER — Other Ambulatory Visit: Payer: Self-pay

## 2021-10-27 ENCOUNTER — Ambulatory Visit
Admission: RE | Admit: 2021-10-27 | Discharge: 2021-10-27 | Disposition: A | Discharge status: Home / Self Care | Payer: BC Managed Care – PPO | Source: Ambulatory Visit | Attending: Internal Medicine | Admitting: Internal Medicine

## 2021-10-27 DIAGNOSIS — E538 Deficiency of other specified B group vitamins: Secondary | ICD-10-CM

## 2021-10-27 DIAGNOSIS — E669 Obesity, unspecified: Secondary | ICD-10-CM

## 2021-10-27 DIAGNOSIS — I10 Essential (primary) hypertension: Secondary | ICD-10-CM

## 2021-10-27 DIAGNOSIS — L309 Dermatitis, unspecified: Secondary | ICD-10-CM

## 2021-10-27 DIAGNOSIS — M62830 Muscle spasm of back: Secondary | ICD-10-CM

## 2021-10-27 DIAGNOSIS — L430 Hypertrophic lichen planus: Secondary | ICD-10-CM

## 2021-10-27 DIAGNOSIS — K219 Gastro-esophageal reflux disease without esophagitis: Secondary | ICD-10-CM

## 2021-10-27 DIAGNOSIS — M545 Low back pain, unspecified: Secondary | ICD-10-CM

## 2021-10-28 ENCOUNTER — Other Ambulatory Visit: Payer: Self-pay | Admitting: Cardiology

## 2021-10-28 DIAGNOSIS — E782 Mixed hyperlipidemia: Secondary | ICD-10-CM

## 2021-11-03 ENCOUNTER — Other Ambulatory Visit: Payer: Self-pay

## 2021-11-03 ENCOUNTER — Ambulatory Visit (INDEPENDENT_AMBULATORY_CARE_PROVIDER_SITE_OTHER): Payer: BC Managed Care – PPO | Admitting: Family Medicine

## 2021-11-03 ENCOUNTER — Encounter (INDEPENDENT_AMBULATORY_CARE_PROVIDER_SITE_OTHER): Payer: Self-pay | Admitting: Family Medicine

## 2021-11-03 ENCOUNTER — Telehealth: Payer: Self-pay

## 2021-11-03 VITALS — BP 132/85 | HR 90 | Temp 98.2°F | Ht 66.0 in | Wt 229.0 lb

## 2021-11-03 DIAGNOSIS — Z6837 Body mass index (BMI) 37.0-37.9, adult: Secondary | ICD-10-CM | POA: Diagnosis not present

## 2021-11-03 DIAGNOSIS — E669 Obesity, unspecified: Secondary | ICD-10-CM | POA: Diagnosis not present

## 2021-11-03 DIAGNOSIS — M62838 Other muscle spasm: Secondary | ICD-10-CM

## 2021-11-03 DIAGNOSIS — I10 Essential (primary) hypertension: Secondary | ICD-10-CM

## 2021-11-03 NOTE — Telephone Encounter (Signed)
Returned call to patient about her Exemestane. I left a vm informing the patient that Dr. Lindi Adie wants her to stop taking the medicine for 2 weeks to see if her side effects improve. I also informed the patient of her follow-up visit with Wilber Bihari on 11/14/21 to discuss side effects and see if there is improvement. Patient advised to contact us with any other problems or concerns.  ?

## 2021-11-08 NOTE — Progress Notes (Signed)
? ? ? ?Chief Complaint:  ? ?OBESITY ?[name] [name] is here to discuss her progress with her obesity treatment plan along with follow-up of her obesity related diagnoses. [name] [name] is on the Category 3 Plan and states she is following her eating plan approximately 50% of the time. [name] [name] states she is doing 0 minutes 0 times per week. ? ?Today's visit was #: 18 ?Starting weight: 237 lbs ?Starting date: 08/26/2020 ?Today's weight: 229 lbs ?Today's date: 11/03/2021 ?Total lbs lost to date: 8 ?Total lbs lost since last in-office visit: 0 ? ?Interim History: [name] [name] has had 2 MRIs since her last appointment. [name] [name] boiled eggs and 1 piece of toast. Lunch-protein shake and an apple. Snack is [name] [name] with lettuce, Miracle whip lite, and [name] [name] seasoning. Some nights she is skipping supper, and other times she is eating meat and veggies. Some days she will eat a bowl of cereal. ? ?Subjective:  ? ?1. Essential hypertension ?[name] [name]'s blood pressure is better controlled today. She denies chest pain, chest pressure, or headache. ? ?2. Muscle spasm ?[name] [name] notes Letrozole exacerbated this; she is still experiencing symptoms on exemestane. Her MRI done on 3/9 was reviewed today by me. ? ?Assessment/Plan:  ? ?1. Essential hypertension ?[name] [name] will continue Cozaar and Norvasc. If her blood pressure stays controlled, then we can consider decreasing her blood pressure medications. ? ?2. Muscle spasm ?[name] [name] will continue to follow up with Dr. Lindi Adie. ? ?3. Obesity with current BMI of 37.1 ?[name] [name] is currently in the action stage of change. As such, her goal is to continue with weight loss efforts. She has agreed to the Category 3 Plan.  ? ?We will repeat fasting labs at her next appointment. ? ?Exercise goals: All adults should avoid inactivity. Some physical activity is better than none, and adults who participate in any amount of physical activity gain some health benefits. ? ?Behavioral modification strategies:  increasing lean protein intake, meal planning and cooking strategies, and keeping healthy foods in the home. ? ?[name] [name] has agreed to follow-up with our clinic in 3 to 4 weeks. She was informed of the importance of frequent follow-up visits to maximize her success with intensive lifestyle modifications for her multiple health conditions.  ? ?Objective:  ? ?Blood pressure 132/85, pulse 90, temperature 98.2 ?F (36.8 ?C), height '5\' 6"'$  (1.676 m), weight 229 lb (103.9 kg), last menstrual period 09/13/2015, SpO2 99 %. ?Body mass index is 36.96 kg/m?. ? ?General: Cooperative, alert, well developed, in no acute distress. ?HEENT: Conjunctivae and lids unremarkable. ?Cardiovascular: Regular rhythm.  ?Lungs: Normal work of breathing. ?Neurologic: No focal deficits.  ? ?Lab Results  ?Component Value Date  ? CREATININE 0.97 04/28/2021  ? BUN 11 04/28/2021  ? NA 140 04/28/2021  ? K 4.2 04/28/2021  ? CL 103 04/28/2021  ? CO2 25 04/28/2021  ? ?Lab Results  ?Component Value Date  ? ALT 12 04/28/2021  ? AST 17 04/28/2021  ? ALKPHOS 83 04/28/2021  ? BILITOT 0.6 04/28/2021  ? ?Lab Results  ?Component Value Date  ? HGBA1C 5.4 04/28/2021  ? HGBA1C 5.4 08/26/2020  ? ?Lab Results  ?Component Value Date  ? INSULIN 21.0 04/28/2021  ? INSULIN 20.0 08/26/2020  ? ?Lab Results  ?Component Value Date  ? TSH 1.440 08/26/2020  ? ?Lab Results  ?Component Value Date  ? CHOL 137 04/28/2021  ? HDL 43 04/28/2021  ? Ward 79 04/28/2021  ? TRIG 77 04/28/2021  ? CHOLHDL 3.2 04/28/2021  ? ?Lab Results  ?Component Value Date  ?  VD25OH 70.4 04/28/2021  ? VD25OH 46.9 08/26/2020  ? ?Lab Results  ?Component Value Date  ? WBC 4.6 04/28/2021  ? HGB 12.9 04/28/2021  ? HCT 40.1 04/28/2021  ? MCV 94 04/28/2021  ? PLT 129 (L) 04/28/2021  ? ?Lab Results  ?Component Value Date  ? IRON 76 04/27/2009  ? FERRITIN 30.7 04/27/2009  ? ?Attestation Statements:  ? ?Reviewed by clinician on day of visit: allergies, medications, problem list, medical history, surgical  history, family history, social history, and previous encounter notes. ? ? ?I, Trixie Dredge, am acting as transcriptionist for Coralie Common, MD. ? ?I have reviewed the above documentation for accuracy and completeness, and I agree with the above. Coralie Common, MD ? ? ?

## 2021-11-14 ENCOUNTER — Telehealth: Payer: Self-pay | Admitting: Adult Health

## 2021-11-14 ENCOUNTER — Other Ambulatory Visit: Payer: Self-pay

## 2021-11-14 ENCOUNTER — Inpatient Hospital Stay: Payer: BC Managed Care – PPO | Attending: Hematology and Oncology | Admitting: Adult Health

## 2021-11-14 DIAGNOSIS — C50311 Malignant neoplasm of lower-inner quadrant of right female breast: Secondary | ICD-10-CM

## 2021-11-14 DIAGNOSIS — Z170 Estrogen receptor positive status [ER+]: Secondary | ICD-10-CM

## 2021-11-14 NOTE — Progress Notes (Signed)
[facility] up: ?  ? ?[name], [name] Mercy, MD ?[address] ?[place] [address] ? ?I connected with [name] [name] on 11/14/21 at  8:15 AM EDT by telephone and verified that I am speaking with the correct person using two identifiers.  ?I discussed the limitations, risks, security and privacy concerns of performing an evaluation and management service by telephone and the availability of in person appointments.  ?I also discussed with the patient that there may be a patient responsible charge related to this service. The patient expressed understanding and agreed to proceed.  ? ?Patient location: [place], [place] in car ?Provider location: [facility] provider office ? ?DIAGNOSIS:  Cancer Staging  ?Malignant neoplasm of lower-inner quadrant of right breast of female, estrogen receptor positive ([place]) ?Staging form: Breast, AJCC 8th Edition ?- Clinical: Stage IB (cT1b, cN0, cM0, G3, ER+, PR-, HER2-) - Signed by [name] Lose, MD on 07/24/2018 ?Neoadjuvant therapy: No ?Histologic grading system: 3 grade system ?Laterality: Right ?Tumor size (mm): 6 ?- Pathologic: Stage IA (pT1b, pN0, cM0, G3, ER+, PR-, HER2-) - Signed by [name] Phlegm, NP on 09/04/2018 ?Histologic grading system: 3 grade system ? ? ?SUMMARY OF ONCOLOGIC HISTORY: ?Oncology History  ?Malignant neoplasm of lower-inner quadrant of right breast of female, estrogen receptor positive ([place])  ?07/16/2018 Initial Diagnosis  ? Pain right breast UOQ: Negative on mammogram, incidentally found right breast mass LIQ at 3:30 position measuring 0.6 cm biopsy revealed grade 3 IDC ER 40%, PR 0%, HER-2 negative, Ki-67 50%, T1bN0 stage Ib ?  ?07/24/2018 Cancer Staging  ? Staging form: Breast, AJCC 8th Edition ?- Clinical: Stage IB (cT1b, cN0, cM0, G3, ER+, PR-, HER2-) - Signed by [name] Lose, MD on 07/24/2018 ? ?  ?08/23/2018 Surgery  ? Right lumpectomy: IDC, 0.7 cm, grade 3, margins negative, 0/3 lymph nodes negative, ER 40%, PR 0%, HER-2  negative, Ki-67 50%, T1BN0 stage Ia ?  ?08/23/2018 Oncotype testing  ? 45/33% ?  ?09/04/2018 Cancer Staging  ? Staging form: Breast, AJCC 8th Edition ?- Pathologic: Stage IA (pT1b, pN0, cM0, G3, ER+, PR-, HER2-) - Signed by [name] Phlegm, NP on 09/04/2018 ? ?  ?09/25/2018 - 01/22/2019 Chemotherapy  ? Adjuvant chemotherapy: completed 4 cycles dose dense Adriamycin and Cytoxan, completed 10 cycles of Taxol, stopped early due to neuropathy ? ?  ?02/19/2019 - 04/07/2019 Radiation Therapy  ? Adjuvant XRT The patient initially received a dose of 50.4 Gy in 28 fractions to the breast using whole-breast tangent fields. This was delivered using a 3-D conformal technique. The patient then received a boost to the seroma. This delivered an additional 10 Gy in 5 fractions using a 3-field photon boost technique. The total dose was 60.4 Gy. ?  ?04/2019 -  Anti-estrogen oral therapy  ? Anastrozole switched to letrozole switched to Exemestane  ?  ? ? ?CURRENT THERAPY: Exemestane ? ?INTERVAL HISTORY: ?[name] [name] 56 y.o. female returns for follow-up of side effects that she has been having from exemestane.  She has noted increased muscle spasms, and pain across her back and across her surgical site.  She has undergone MRI lumbar spine and thoracic spine that shows some degeneration and lumbar disc protrusion.  The pain she is experiencing has been going on for 5-6 months.  She stopped exemestane about 11/04/21.  She has noted an improvement since stopping the exemestane.   ? ?Her most recent mammogram was completed on September 29, 2021.  It showed no evidence of malignancy and  breast density category B. ? ? ?Patient Active Problem List  ? Diagnosis Date Noted  ? Insulin resistance 10/21/2020  ? Gastroesophageal reflux disease 10/21/2020  ? White coat syndrome with hypertension 10/21/2020  ? At risk for diabetes mellitus 10/21/2020  ? Constipation 09/30/2020  ? SOBOE (shortness of breath on exertion) 08/26/2020  ? Vitamin D  deficiency 08/26/2020  ? Other fatigue 08/26/2020  ? Malignant neoplasm of right female breast ([place]) 08/26/2020  ? At risk for impaired metabolic function 70/62/3762  ? Elevated coronary artery calcium score 07/14/2020  ? Mixed hyperlipidemia 07/14/2020  ? Atypical chest pain 07/08/2020  ? Chronic fatigue 07/07/2020  ? Left arm pain 12/05/2019  ? Leg edema 05/31/2019  ? Port-A-Cath in place 09/25/2018  ? Malignant neoplasm of lower-inner quadrant of right breast of female, estrogen receptor positive ([place]) 07/16/2018  ? Screening cholesterol level 07/10/2017  ? Anal fissure 07/07/2014  ? Pruritic rash 06/16/2014  ? Class 2 severe obesity with serious comorbidity and body mass index (BMI) of 38.0 to 38.9 in adult [facility]) 06/16/2014  ? Primary localized osteoarthrosis, lower leg 11/18/2013  ? Bilateral knee pain 09/08/2013  ? VITAMIN B12 DEFICIENCY 02/10/2010  ? Essential hypertension 08/16/2007  ? ALLERGIC RHINITIS 08/16/2007  ? GERD 08/16/2007  ? Right upper quadrant pain 05/13/2007  ? ? ?is allergic to ibuprofen, lisinopril, dilaudid [hydromorphone hcl], and tape. ? ?MEDICAL HISTORY: ?Past Medical History:  ?Diagnosis Date  ? Allergy   ? seasonal  ? Anal fissure   ? Back pain   ? Breast cancer ([facility] [place]) 2019  ? R BREAST IDC  ? Complication of anesthesia   ? Constipation   ? Diverticulitis   ? Fatty liver   ? GERD (gastroesophageal reflux disease) 07/07/2005  ? Heartburn   ? Hemorrhoid   ? Hepatic steatosis   ? Hypertension   ? Internal hemorrhoids   ? Joint pain   ? Malignant neoplasm of lower-inner quadrant of right breast of female, estrogen receptor positive ([place]) 07/16/2018  ? Personal history of chemotherapy   ? Personal history of radiation therapy   ? PONV (postoperative nausea and vomiting)   ? Positive H. pylori test   ? SOBOE (shortness of breath on exertion)   ? ? ?SURGICAL HISTORY: ?Past Surgical History:  ?Procedure Laterality Date  ? BREAST BIOPSY Right 07/12/2018  ? R BREAST IDC  ? BREAST LUMPECTOMY Right  08/22/2018  ? IDC  ? BREAST LUMPECTOMY WITH RADIOACTIVE SEED AND SENTINEL LYMPH NODE BIOPSY Right 08/23/2018  ? Procedure: RIGHT BREAST LUMPECTOMY WITH RADIOACTIVE SEED AND RIGHT SENTINEL LYMPH NODE MAPPING;  Surgeon: Erroll Luna, MD;  Location: [facility];  Service: General;  Laterality: Right;  ? COLON RESECTION  2000's Dr. Hassell Done  ? Diverticulitis.  ? COLONOSCOPY    ? FOOT SURGERY Left   ? g3p2 c-section1    ? KNEE SURGERY Right   ? PORT-A-CATH REMOVAL Right 04/15/2019  ? Procedure: REMOVAL PORT-A-CATH;  Surgeon: Erroll Luna, MD;  Location: [facility];  Service: General;  Laterality: Right;  MAC AND LOCAL ANESTHETIC  ? PORTACATH PLACEMENT Right 09/24/2018  ? Procedure: INSERTION PORT-A-CATH WITH ULTRASOUND;  Surgeon: Erroll Luna, MD;  Location: [facility];  Service: General;  Laterality: Right;  ? TONSILLECTOMY    ? TUBAL LIGATION    ? ? ?SOCIAL HISTORY: ?Social History  ? ?Socioeconomic History  ? Marital status: Divorced  ?  Spouse name: Not on file  ? Number of children: 3  ? Years  of education: 14  ? Highest education level: Not on file  ?Occupational History  ? Occupation: Press photographer  ?  Employer: NOT EMPLOYED  ?Tobacco Use  ? Smoking status: Never  ? Smokeless tobacco: Never  ?Vaping Use  ? Vaping Use: Never used  ?Substance and Sexual Activity  ? Alcohol use: No  ?  Alcohol/week: 0.0 standard drinks  ? Drug use: No  ? Sexual activity: Not Currently  ?  Birth control/protection: Post-menopausal  ?Other Topics Concern  ? Not on file  ?Social History Narrative  ? [employer], [employer] - Human service/sociology. Married '2000 - 4yr/seperated. 2 boys - '82, '87, 1 dtr - '81.  ? 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   ? ?Social Determinants of Health  ? ?Financial Resource Strain: Not on file  ?Food Insecurity: Not on file  ?Transportation Needs: Not on file  ?Physical Activity: Not on file  ?Stress: Not on file  ?Social Connections: Not on file  ?Intimate Partner Violence: Not on file  ? ? ?FAMILY  HISTORY: ?Family History  ?Problem Relation Age of Onset  ? Hypertension Mother   ? Breast cancer Mother [phone] ? Cancer Mother   ? Alcoholism Mother   ? Drug abuse Mother   ? Hypertension Father   ? Heart

## 2021-11-14 NOTE — Assessment & Plan Note (Addendum)
08/23/2018:Right lumpectomy: IDC, 0.7 cm, grade 3, margins negative, 0/3 lymph nodes negative, ER 40%, PR 0%, HER-2 negative, Ki-67 50%, T1BN0 stage Ia ?? ?Oncotype Dx 44: High Risk ?? ?Treatment plan: ?1.?Adj Chemo with Dose dense adriamycin and Cytoxan foll by Taxol weekly X?10?stopped early for toxicities ?2. Adjuvant radiation therapy?02/20/2019?-03/28/2019? ?3. Adjuvant antiestrogen therapy ?------------------------------------------------------------------------------------------------------------ ?Port will be removed 04/15/2019 ?Treatment plan: Adjuvant antiestrogen therapy with anastrozole 1 mg daily, switched to letrozole, switching to exemestane started August 21, 2020, stopped November 03, 2021 due to increased back pain. ? ?[name] [name] has undergone imaging of her thoracic and lumbar spine.  She also has stopped taking the exemestane.  She has noticed an improvement in her significant back pain.  After discussion we decided that she would stay off the exemestane for 3 more weeks as the pain is not completely resolved.  At that time if the pain is still present we will refer her [place] [facility] for pain management.  I also discussed potentially starting tamoxifen with her.  We will discuss this further in a few weeks as she continues to get the medication out of her system. ? ?[name] [name] and I will touch base on April 21 for another telephone visit to see how she is doing and decide on next steps. ?? ? ? ?

## 2021-11-14 NOTE — Telephone Encounter (Signed)
Scheduled appointment per inbasket message. Patient aware.   ?

## 2021-11-17 ENCOUNTER — Encounter: Payer: BC Managed Care – PPO | Admitting: Obstetrics and Gynecology

## 2021-11-24 ENCOUNTER — Ambulatory Visit (INDEPENDENT_AMBULATORY_CARE_PROVIDER_SITE_OTHER): Payer: BC Managed Care – PPO

## 2021-11-24 ENCOUNTER — Encounter (HOSPITAL_COMMUNITY): Payer: Self-pay

## 2021-11-24 ENCOUNTER — Ambulatory Visit (HOSPITAL_COMMUNITY)
Admission: EM | Admit: 2021-11-24 | Discharge: 2021-11-24 | Disposition: A | Discharge status: Home / Self Care | Payer: BC Managed Care – PPO | Attending: Physician Assistant | Admitting: Physician Assistant

## 2021-11-24 DIAGNOSIS — M25531 Pain in right wrist: Secondary | ICD-10-CM | POA: Diagnosis not present

## 2021-11-24 DIAGNOSIS — M79674 Pain in right toe(s): Secondary | ICD-10-CM

## 2021-11-24 DIAGNOSIS — M79671 Pain in right foot: Secondary | ICD-10-CM

## 2021-11-24 DIAGNOSIS — W19XXXA Unspecified fall, initial encounter: Secondary | ICD-10-CM

## 2021-11-24 DIAGNOSIS — M25512 Pain in left shoulder: Secondary | ICD-10-CM | POA: Diagnosis not present

## 2021-11-24 NOTE — ED Provider Notes (Signed)
[facility]    CSN: [id number] Arrival date & time: 11/24/21  1642      History   Chief Complaint Chief Complaint  Patient presents with   Fall    HPI [name] [name] is a 56 y.o. female.   Pt complains of left shoulder pain, right wrist pain, and right great toe pain after she suffered a fall 5 days ago.  Pt reports she was shopping and she thinks she may have tripped on something and fell forward, catching herself with right outstretched hand.  Denies dizziness, syncope prior to fall.  Denies hitting her head or LOC. She has tried ibuprofen with minimal relief.  Reports swelling to right great toe has improved somewhat.  Right foot pain worse with ambulation.  Reports decreased ROM of left shoulder due to pain.    Past Medical History:  Diagnosis Date   Allergy    seasonal   Anal fissure    Back pain    Breast cancer ([facility]) 2019   R BREAST IDC   Complication of anesthesia    Constipation    Diverticulitis    Fatty liver    GERD (gastroesophageal reflux disease) 07/07/2005   Heartburn    Hemorrhoid    Hepatic steatosis    Hypertension    Internal hemorrhoids    Joint pain    Malignant neoplasm of lower-inner quadrant of right breast of female, estrogen receptor positive ([facility]) 07/16/2018   Personal history of chemotherapy    Personal history of radiation therapy    PONV (postoperative nausea and vomiting)    Positive H. pylori test    SOBOE (shortness of breath on exertion)     Patient Active Problem List   Diagnosis Date Noted   Insulin resistance 10/21/2020   Gastroesophageal reflux disease 10/21/2020   White coat syndrome with hypertension 10/21/2020   At risk for diabetes mellitus 10/21/2020   Constipation 09/30/2020   SOBOE (shortness of breath on exertion) 08/26/2020   Vitamin D deficiency 08/26/2020   Other fatigue 08/26/2020   Malignant neoplasm of right female breast ([facility]) 08/26/2020   At risk for impaired metabolic function  08/26/2020   Elevated coronary artery calcium score 07/14/2020   Mixed hyperlipidemia 07/14/2020   Atypical chest pain 07/08/2020   Chronic fatigue 07/07/2020   Left arm pain 12/05/2019   Leg edema 05/31/2019   Port-A-Cath in place 09/25/2018   Malignant neoplasm of lower-inner quadrant of right breast of female, estrogen receptor positive ([facility]) 07/16/2018   Screening cholesterol level 07/10/2017   Anal fissure 07/07/2014   Pruritic rash 06/16/2014   Class 2 severe obesity with serious comorbidity and body mass index (BMI) of 38.0 to 38.9 in adult ([facility]) 06/16/2014   Primary localized osteoarthrosis, lower leg 11/18/2013   Bilateral knee pain 09/08/2013   VITAMIN B12 DEFICIENCY 02/10/2010   Essential hypertension 08/16/2007   ALLERGIC RHINITIS 08/16/2007   GERD 08/16/2007   Right upper quadrant pain 05/13/2007    Past Surgical History:  Procedure Laterality Date   BREAST BIOPSY Right 07/12/2018   R BREAST IDC   BREAST LUMPECTOMY Right 08/22/2018   IDC   BREAST LUMPECTOMY WITH RADIOACTIVE SEED AND SENTINEL LYMPH NODE BIOPSY Right 08/23/2018   Procedure: RIGHT BREAST LUMPECTOMY WITH RADIOACTIVE SEED AND RIGHT SENTINEL LYMPH NODE MAPPING;  Surgeon: Harriette Bouillon, MD;  [facility];  Service: General;  Laterality: Right;   COLON RESECTION  2000's Dr. Daphine Deutscher   Diverticulitis.   COLONOSCOPY  FOOT SURGERY Left    g3p2 c-section1     KNEE SURGERY Right    PORT-A-CATH REMOVAL Right 04/15/2019   Procedure: REMOVAL PORT-A-CATH;  Surgeon: Harriette Bouillon, MD;  Location: [facility];  Service: General;  Laterality: Right;  MAC AND LOCAL ANESTHETIC   PORTACATH PLACEMENT Right 09/24/2018   Procedure: INSERTION PORT-A-CATH WITH ULTRASOUND;  Surgeon: Harriette Bouillon, MD;  Location: [facility];  Service: General;  Laterality: Right;   TONSILLECTOMY     TUBAL LIGATION      OB History     Gravida  3   Para  3   Term      Preterm      AB      Living          SAB      IAB      Ectopic      Multiple      Live Births               Home Medications    Prior to Admission medications   Medication Sig Start Date End Date Taking? Authorizing Provider  AMBULATORY NON FORMULARY MEDICATION Medication Name: Diltiazem 2%/Lidocaine 2%   Using your index finger apply a small amount of medication inside the anal opening and to the external anal area three times daily x 6-8 weeks. 06/10/21   Unk Lightning, PA  amLODipine (NORVASC) 5 MG tablet Take 5 mg by mouth daily.    [provider]  azelastine (OPTIVAR) 0.05 % ophthalmic solution Place 1 drop into both eyes 2 (two) times daily.    [provider]  cetirizine (ZYRTEC) 10 MG tablet Take 10 mg by mouth daily.    [provider]  Cholecalciferol (VITAMIN D) 50 MCG (2000 UT) CAPS Alternate 2000 IU and 1000 IU every other day. 05/19/21   Whitmire, Thermon Leyland, FNP  clobetasol ointment (TEMOVATE) 0.05 % Apply 1 application topically 2 (two) times daily. Then as needed for 30 days 07/03/18   [provider]  clotrimazole-betamethasone (LOTRISONE) cream Apply 1 application topically 2 (two) times daily. For 5-7 days 03/22/21   Beverley Fiedler, MD  exemestane (AROMASIN) 25 MG tablet Take 1 tablet (25 mg total) by mouth daily after breakfast. 10/18/21   Serena Croissant, MD  fluticasone (FLONASE) 50 MCG/ACT nasal spray Place 2 sprays into both nostrils daily.    [provider]  hydrocortisone (PROCTOSOL HC) 2.5 % rectal cream Place 1 application rectally daily. 09/09/21   Meredith Pel, NP  hydrOXYzine (ATARAX) 25 MG tablet Take 25 mg by mouth 3 (three) times daily as needed.    [provider]  losartan (COZAAR) 50 MG tablet Take 50 mg by mouth daily.    [provider]  montelukast (SINGULAIR) 10 MG tablet Take 10 mg by mouth at bedtime.    [provider]  pantoprazole (PROTONIX) 40 MG tablet Take 1 tablet (40 mg total) by  mouth daily. 01/12/21   Esterwood, Amy S, PA-C  rosuvastatin (CRESTOR) 10 MG tablet TAKE 1 TABLET(10 MG) BY MOUTH DAILY 10/28/21   Patwardhan, Anabel Bene, MD    Family History Family History  Problem Relation Age of Onset   Hypertension Mother    Breast cancer Mother 38   Cancer Mother    Alcoholism Mother    Drug abuse Mother    Hypertension Father    Heart disease Father    Heart attack Father    Hypertension  Brother    Diabetes Neg Hx    COPD Neg Hx    Colon cancer Neg Hx    Stomach cancer Neg Hx    Pancreatic cancer Neg Hx    Esophageal cancer Neg Hx    Rectal cancer Neg Hx     Social History Social History   Tobacco Use   Smoking status: Never   Smokeless tobacco: Never  Vaping Use   Vaping Use: Never used  Substance Use Topics   Alcohol use: No    Alcohol/week: 0.0 standard drinks   Drug use: No     Allergies   Ibuprofen, Lisinopril, Dilaudid [hydromorphone hcl], and Tape   Review of Systems Review of Systems  Constitutional:  Negative for chills and fever.  HENT:  Negative for ear pain and sore throat.   Eyes:  Negative for pain and visual disturbance.  Respiratory:  Negative for cough and shortness of breath.   Cardiovascular:  Negative for chest pain and palpitations.  Gastrointestinal:  Negative for abdominal pain and vomiting.  Genitourinary:  Negative for dysuria and hematuria.  Musculoskeletal:  Positive for arthralgias. Negative for back pain.  Skin:  Negative for color change and rash.  Neurological:  Negative for seizures and syncope.  All other systems reviewed and are negative.   Physical Exam Triage Vital Signs ED Triage Vitals  Enc Vitals Group     BP 11/24/21 1801 (!) 145/82     Pulse Rate 11/24/21 1801 89     Resp 11/24/21 1801 17     Temp 11/24/21 1801 98.3 F (36.8 C)     Temp Source 11/24/21 1801 Oral     SpO2 11/24/21 1801 97 %     Weight --      Height --      Head Circumference --      Peak Flow --      Pain Score  11/24/21 1800 6     Pain Loc --      Pain Edu? --      Excl. in GC? --    No data found.  Updated Vital Signs BP (!) 145/82 (BP Location: Left Arm)   Pulse 89   Temp 98.3 F (36.8 C) (Oral)   Resp 17   LMP 09/13/2015 Comment: celebate, BTL  SpO2 97%   Visual Acuity Right Eye Distance:   Left Eye Distance:   Bilateral Distance:    Right Eye Near:   Left Eye Near:    Bilateral Near:     Physical Exam Vitals and nursing note reviewed.  Constitutional:      General: She is not in acute distress.    Appearance: She is well-developed.  HENT:     Head: Normocephalic and atraumatic.  Eyes:     Conjunctiva/sclera: Conjunctivae normal.  Cardiovascular:     Rate and Rhythm: Normal rate and regular rhythm.     Heart sounds: No murmur heard. Pulmonary:     Effort: Pulmonary effort is normal. No respiratory distress.     Breath sounds: Normal breath sounds.  Abdominal:     Palpations: Abdomen is soft.     Tenderness: There is no abdominal tenderness.  Musculoskeletal:        General: No swelling.     Right shoulder: Normal.     Left shoulder: No swelling, deformity or laceration. Decreased range of motion.     Right wrist: Bony tenderness present. No swelling. Decreased range of motion.     Cervical  back: Neck supple.     Right foot: Bony tenderness (right great toe) present.     Comments: Decreased ROM with left shoulder abduction.   Skin:    General: Skin is warm and dry.     Capillary Refill: Capillary refill takes less than 2 seconds.  Neurological:     Mental Status: She is alert.  Psychiatric:        Mood and Affect: Mood normal.     UC Treatments / Results  Labs (all labs ordered are listed, but only abnormal results are displayed) Labs Reviewed - No data to display  EKG   Radiology DG Wrist Complete Right  Result Date: 11/24/2021 CLINICAL DATA:  Fall, pain EXAM: RIGHT WRIST - COMPLETE 3+ VIEW COMPARISON:  None. FINDINGS: There is no evidence of  fracture or dislocation. There is no evidence of arthropathy or other focal bone abnormality. Soft tissues are unremarkable. IMPRESSION: Negative. Electronically Signed   By: Larose Hires D.O.   On: 11/24/2021 18:25   DG Shoulder Left  Result Date: 11/24/2021 CLINICAL DATA:  Fall. EXAM: LEFT SHOULDER - 2+ VIEW COMPARISON:  None. FINDINGS: There is no evidence of fracture or dislocation. There are mild degenerative changes of the acromioclavicular joint. Joint spaces are well maintained. Soft tissues are unremarkable. IMPRESSION: No acute bony abnormality. Electronically Signed   By: Darliss Cheney M.D.   On: 11/24/2021 18:26   DG Foot 2 Views Right  Result Date: 11/24/2021 CLINICAL DATA:  Larey Seat 5 days ago, pain EXAM: RIGHT FOOT - 2 VIEW COMPARISON:  06/30/2021 FINDINGS: Osseous mineralization low normal. Joint spaces preserved. Moderate-sized plantar calcaneal spur. No acute fracture, dislocation, or bone destruction. IMPRESSION: No acute osseous abnormalities. Electronically Signed   By: Ulyses Southward M.D.   On: 11/24/2021 18:34    Procedures Procedures (including critical care time)  Medications Ordered in UC Medications - No data to display  Initial Impression / Assessment and Plan / UC Course  I have reviewed the triage vital signs and the nursing notes.  Pertinent labs & imaging results that were available during my care of the patient were reviewed by me and considered in my medical decision making (see chart for details).     Images negative for fracture.  Advised rest, ice, elevation.  Ibuprofen as needed for pain.  Return if symptoms become worse.  Final Clinical Impressions(s) / UC Diagnoses   Final diagnoses:  Fall, initial encounter  Acute pain of left shoulder  Right wrist pain  Great toe pain, right     Discharge Instructions      Recommend Ibuprofen as needed for pain and swelling Can apply ice to affected areas    ED Prescriptions   None    PDMP not reviewed  this encounter.   Ward, Tylene Fantasia, PA-C 11/24/21 [phone]

## 2021-11-24 NOTE — ED Triage Notes (Signed)
Pt presents with left shoulder pain, right wrist pain, and right foot pain after a fall X 5 days ago. ?

## 2021-11-24 NOTE — Discharge Instructions (Signed)
Recommend Ibuprofen as needed for pain and swelling ?Can apply ice to affected areas ?

## 2021-12-01 ENCOUNTER — Encounter (INDEPENDENT_AMBULATORY_CARE_PROVIDER_SITE_OTHER): Payer: Self-pay | Admitting: Family Medicine

## 2021-12-01 ENCOUNTER — Ambulatory Visit (INDEPENDENT_AMBULATORY_CARE_PROVIDER_SITE_OTHER): Payer: BC Managed Care – PPO | Admitting: Family Medicine

## 2021-12-01 VITALS — BP 135/78 | HR 86 | Temp 98.2°F | Ht 66.0 in | Wt 236.0 lb

## 2021-12-01 DIAGNOSIS — E559 Vitamin D deficiency, unspecified: Secondary | ICD-10-CM

## 2021-12-01 DIAGNOSIS — E66812 Body mass index (BMI) 38.0-38.9, adult: Secondary | ICD-10-CM

## 2021-12-01 DIAGNOSIS — E88819 Insulin resistance, unspecified: Secondary | ICD-10-CM

## 2021-12-01 DIAGNOSIS — F419 Anxiety disorder, unspecified: Secondary | ICD-10-CM

## 2021-12-01 DIAGNOSIS — E8881 Metabolic syndrome: Secondary | ICD-10-CM | POA: Diagnosis not present

## 2021-12-01 DIAGNOSIS — F32A Depression, unspecified: Secondary | ICD-10-CM

## 2021-12-01 DIAGNOSIS — I10 Essential (primary) hypertension: Secondary | ICD-10-CM

## 2021-12-01 DIAGNOSIS — Z6838 Body mass index (BMI) 38.0-38.9, adult: Secondary | ICD-10-CM

## 2021-12-01 DIAGNOSIS — E7849 Other hyperlipidemia: Secondary | ICD-10-CM | POA: Diagnosis not present

## 2021-12-01 DIAGNOSIS — E669 Obesity, unspecified: Secondary | ICD-10-CM

## 2021-12-01 MED ORDER — SERTRALINE HCL 50 MG PO TABS: 50.0000 mg | ORAL_TABLET | Freq: Every day | ORAL | 0 refills | Status: DC

## 2021-12-02 LAB — LIPID PANEL WITH LDL/HDL RATIO
Cholesterol, Total: 195 mg/dL (ref 100–199)
HDL: 52 mg/dL (ref >39)
LDL Chol Calc (NIH): 124 mg/dL — ABNORMAL HIGH (ref 0–99)
LDL/HDL Ratio: 2.4 ratio (ref 0.0–3.2)
Triglycerides: 107 mg/dL (ref 0–149)
VLDL Cholesterol Cal: 19 mg/dL (ref 5–40)

## 2021-12-02 LAB — COMPREHENSIVE METABOLIC PANEL
ALT: 88 IU/L — ABNORMAL HIGH (ref 0–32)
AST: 63 IU/L — ABNORMAL HIGH (ref 0–40)
Albumin/Globulin Ratio: 1.7 (ref 1.2–2.2)
Albumin: 4.3 g/dL (ref 3.8–4.9)
Alkaline Phosphatase: 106 IU/L (ref 44–121)
BUN/Creatinine Ratio: 19 (ref 9–23)
BUN: 15 mg/dL (ref 6–24)
Bilirubin Total: 0.4 mg/dL (ref 0.0–1.2)
CO2: 23 mmol/L (ref 20–29)
Calcium: 9.3 mg/dL (ref 8.7–10.2)
Chloride: 104 mmol/L (ref 96–106)
Creatinine, Ser: 0.81 mg/dL (ref 0.57–1.00)
Globulin, Total: 2.6 g/dL (ref 1.5–4.5)
Glucose: 95 mg/dL (ref 70–99)
Potassium: 4 mmol/L (ref 3.5–5.2)
Sodium: 138 mmol/L (ref 134–144)
Total Protein: 6.9 g/dL (ref 6.0–8.5)
eGFR: 84 mL/min/{1.73_m2} (ref >59)

## 2021-12-02 LAB — VITAMIN D 25 HYDROXY (VIT D DEFICIENCY, FRACTURES): Vit D, 25-Hydroxy: 58.3 ng/mL (ref 30.0–100.0)

## 2021-12-02 LAB — HEMOGLOBIN A1C
Est. average glucose Bld gHb Est-mCnc: 108 mg/dL
Hgb A1c MFr Bld: 5.4 % (ref 4.8–5.6)

## 2021-12-02 LAB — INSULIN, RANDOM: INSULIN: 18.1 u[IU]/mL (ref 2.6–24.9)

## 2021-12-08 ENCOUNTER — Encounter: Payer: Self-pay | Admitting: Obstetrics and Gynecology

## 2021-12-08 ENCOUNTER — Ambulatory Visit: Payer: BC Managed Care – PPO | Admitting: Obstetrics and Gynecology

## 2021-12-08 VITALS — BP 120/70 | HR 62 | Ht 66.0 in | Wt 237.0 lb

## 2021-12-08 DIAGNOSIS — K5900 Constipation, unspecified: Secondary | ICD-10-CM

## 2021-12-08 DIAGNOSIS — R1031 Right lower quadrant pain: Secondary | ICD-10-CM | POA: Diagnosis not present

## 2021-12-08 DIAGNOSIS — H1045 Other chronic allergic conjunctivitis: Secondary | ICD-10-CM | POA: Insufficient documentation

## 2021-12-08 DIAGNOSIS — J300 Vasomotor rhinitis: Secondary | ICD-10-CM | POA: Insufficient documentation

## 2021-12-08 NOTE — Progress Notes (Signed)
? ? ? ?Chief Complaint:  ? ?OBESITY ?[name] [name] is here to discuss her progress with her obesity treatment plan along with follow-up of her obesity related diagnoses. Nellene is on the Category 3 Plan and states she is following her eating plan approximately 50% of the time. [name] states she is walking for 30 minutes 5 times per week. ? ?Today's visit was #: 19 ?Starting weight: 237 lbs ?Starting date: 08/26/2020 ?Today's weight: 236 lbs ?Today's date: 12/01/2021 ?Total lbs lost to date: 1 ?Total lbs lost since last in-office visit: 0 ? ?Interim History: Anieya was taken off cancer medicine and is being discouraged with weight gain. Not sure what will make it easier to follow the mea pan. She is feeling somewhat depressed and she has lack of energy. She has gone to [facility] since her last appointment, and felt ok while away. ? ?Subjective:  ? ?1. Insulin resistance ?Zaya's last A1c was 5.4 and insulin 21.0. She is not on medications. ? ?2. Essential hypertension ?Nohemy's blood pressure is controlled today. She denies chest pain, chest pressure, or headache. ? ?3. Vitamin D deficiency ?Tierria is on Vitamin D OTC. She is on 2,000 IU alternating with 1,000 IU.  ? ?4. Other hyperlipidemia ?Taniesha's last LDL was within normal limits at 79. She is on Crestor, and she denies transaminitis.  ? ?5. Anxiety and depression ?Penne hasn't taken anything for depression. She denies suicidal or homicidal ideations.  ? ?Assessment/Plan:  ? ?1. Insulin resistance ?We will check labs today, and we will follow up at Letta's next appointment.  ? ?- Hemoglobin A1c ?- Insulin, random ? ?2. Essential hypertension ?We will check labs today, and we will follow up at Arnesha's next appointment.  ? ?- Comprehensive metabolic panel ? ?3. Vitamin D deficiency ?We will check labs today, and we will follow up at Jessye's next appointment.  ? ?- VITAMIN D 25 Hydroxy (Vit-D Deficiency, Fractures) ? ?4. Other  hyperlipidemia ?We will check labs today, and we will follow up at Circe's next appointment.  ? ?- Lipid Panel With LDL/HDL Ratio ? ?5. Anxiety and depression ?Darcee agreed to start sertraline 50 mg PO daily with a 90 days supply, no additional refill.  ? ?- sertraline (ZOLOFT) 50 MG tablet; Take 1 tablet (50 mg total) by mouth daily.  Dispense: 90 tablet; Refill: 0 ? ?6. Obesity with current BMI of 38.1 ?[name] is currently in the action stage of change. As such, her goal is to continue with weight loss efforts. She has agreed to the Category 3 Plan.  ? ?Exercise goals: All adults should avoid inactivity. Some physical activity is better than none, and adults who participate in any amount of physical activity gain some health benefits. ? ?Behavioral modification strategies: increasing lean protein intake, meal planning and cooking strategies, and keeping healthy foods in the home. ? ?[name] has agreed to follow-up with our clinic in 8 weeks. She was informed of the importance of frequent follow-up visits to maximize her success with intensive lifestyle modifications for her multiple health conditions.  ? ?Objective:  ? ?Blood pressure 135/78, pulse 86, temperature 98.2 ?F (36.8 ?C), height '5\' 6"'$  (1.676 m), weight 236 lb (107 kg), last menstrual period 09/13/2015, SpO2 100 %. ?Body mass index is 38.09 kg/m?. ? ?General: Cooperative, alert, well developed, in no acute distress. ?HEENT: Conjunctivae and lids unremarkable. ?Cardiovascular: Regular rhythm.  ?Lungs: Normal work of breathing. ?Neurologic: No focal deficits.  ? ?Lab Results  ?Component Value Date  ? CREATININE 0.81 12/01/2021  ?  BUN 15 12/01/2021  ? NA 138 12/01/2021  ? K 4.0 12/01/2021  ? CL 104 12/01/2021  ? CO2 23 12/01/2021  ? ?Lab Results  ?Component Value Date  ? ALT 88 (H) 12/01/2021  ? AST 63 (H) 12/01/2021  ? ALKPHOS 106 12/01/2021  ? BILITOT 0.4 12/01/2021  ? ?Lab Results  ?Component Value Date  ? HGBA1C 5.4 12/01/2021  ? HGBA1C 5.4  04/28/2021  ? HGBA1C 5.4 08/26/2020  ? ?Lab Results  ?Component Value Date  ? INSULIN 18.1 12/01/2021  ? INSULIN 21.0 04/28/2021  ? INSULIN 20.0 08/26/2020  ? ?Lab Results  ?Component Value Date  ? TSH 1.440 08/26/2020  ? ?Lab Results  ?Component Value Date  ? CHOL 195 12/01/2021  ? HDL 52 12/01/2021  ? LDLCALC 124 (H) 12/01/2021  ? TRIG 107 12/01/2021  ? CHOLHDL 3.2 04/28/2021  ? ?Lab Results  ?Component Value Date  ? VD25OH 58.3 12/01/2021  ? VD25OH 70.4 04/28/2021  ? VD25OH 46.9 08/26/2020  ? ?Lab Results  ?Component Value Date  ? WBC 4.6 04/28/2021  ? HGB 12.9 04/28/2021  ? HCT 40.1 04/28/2021  ? MCV 94 04/28/2021  ? PLT 129 (L) 04/28/2021  ? ?Lab Results  ?Component Value Date  ? IRON 76 04/27/2009  ? FERRITIN 30.7 04/27/2009  ? ?Attestation Statements:  ? ?Reviewed by clinician on day of visit: allergies, medications, problem list, medical history, surgical history, family history, social history, and previous encounter notes. ? ? ?I, Trixie Dredge, am acting as transcriptionist for Coralie Common, MD. ? ?I have reviewed the above documentation for accuracy and completeness, and I agree with the above. Coralie Common, MD ? ? ?

## 2021-12-08 NOTE — Progress Notes (Signed)
56 y.o. G3P3 Divorced Black or [place] American Not Hispanic or Latino female here for evaluation of  lower right abdominal pain that has since subsided.   ?She reports a 2-3 month h/o intermittent pain in the RLQ. The pain would come every 2-3 weeks and last for a couple of days. The pain was a dull pressure up to a 7-8/10 in severity. She hasn't felt it in a while.  ?She has a long h/o of constipation. She takes a laxative every 3 days to have a BM, needs to stain to have a BM, feels like it just won't come out. No vaginal bulge.  ?H/O hemorrhoid banding in ~11/22. ? ?No urinary c/o ? ?No vaginal bleeding. Not sexually active.  ? ?H/O right breast cancer, diagnosed in 11/19. Treated with lumpectomy, chemo and radiation.  ?  ? ?Patient's last menstrual period was 09/13/2015.          ?Sexually active: No.  ?The current method of family planning is tubal ligation.    ?Exercising: Yes.     Walking  ?Smoker:  no ? ?Health Maintenance: ?Pap:  04/16/18 Wnl HR hpv neg  ?History of abnormal Pap:  no ?MMG:  09/29/21 density B Bi-rads 1 neg  ?BMD:   none  ?Colonoscopy: 08/28/17 f/u 10 years  ?TDaP:  2022 per patient  ?Gardasil: n/a ? ? reports that she has never smoked. She has never used smokeless tobacco. She reports that she does not drink alcohol and does not use drugs. 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 3 grown kids, 2 living grandchildren. Oldest (7 year old) grandson was killed in the fall of 2022. ? ?Past Medical History:  ?Diagnosis Date  ? Allergy   ? seasonal  ? Anal fissure   ? Back pain   ? Breast cancer ([place]) 2019  ? R BREAST IDC  ? Complication of anesthesia   ? Constipation   ? Diverticulitis   ? Fatty liver   ? GERD (gastroesophageal reflux disease) 07/07/2005  ? Heartburn   ? Hemorrhoid   ? Hepatic steatosis   ? Hypertension   ? Internal hemorrhoids   ? Joint pain   ? Malignant neoplasm of lower-inner quadrant of right breast of female, estrogen receptor positive ([place]) 07/16/2018  ? Personal history of chemotherapy   ?  Personal history of radiation therapy   ? PONV (postoperative nausea and vomiting)   ? Positive H. pylori test   ? SOBOE (shortness of breath on exertion)   ? ? ?Past Surgical History:  ?Procedure Laterality Date  ? BREAST BIOPSY Right 07/12/2018  ? R BREAST IDC  ? BREAST LUMPECTOMY Right 08/22/2018  ? IDC  ? BREAST LUMPECTOMY WITH RADIOACTIVE SEED AND SENTINEL LYMPH NODE BIOPSY Right 08/23/2018  ? Procedure: RIGHT BREAST LUMPECTOMY WITH RADIOACTIVE SEED AND RIGHT SENTINEL LYMPH NODE MAPPING;  Surgeon: Erroll Luna, MD;  Location: [facility];  Service: General;  Laterality: Right;  ? COLON RESECTION  2000's Dr. Hassell Done  ? Diverticulitis.  ? COLONOSCOPY    ? FOOT SURGERY Left   ? g3p2 c-section1    ? KNEE SURGERY Right   ? PORT-A-CATH REMOVAL Right 04/15/2019  ? Procedure: REMOVAL PORT-A-CATH;  Surgeon: Erroll Luna, MD;  Location: [facility];  Service: General;  Laterality: Right;  MAC AND LOCAL ANESTHETIC  ? PORTACATH PLACEMENT Right 09/24/2018  ? Procedure: INSERTION PORT-A-CATH WITH ULTRASOUND;  Surgeon: Erroll Luna, MD;  [facility];  Service: General;  Laterality: Right;  ? TONSILLECTOMY    ?  TUBAL LIGATION    ? ? ?Current Outpatient Medications  ?Medication Sig Dispense Refill  ? AMBULATORY NON FORMULARY MEDICATION Medication Name: Diltiazem 2%/Lidocaine 2% ? ? Using your index finger apply a small amount of medication inside the anal opening and to the external anal area three times daily x 6-8 weeks. 30 g 1  ? amLODipine (NORVASC) 5 MG tablet Take 5 mg by mouth daily.    ? azelastine (OPTIVAR) 0.05 % ophthalmic solution Place 1 drop into both eyes 2 (two) times daily.    ? cetirizine (ZYRTEC) 10 MG tablet Take 10 mg by mouth daily.    ? Cholecalciferol (VITAMIN D) 50 MCG (2000 UT) CAPS Alternate 2000 IU and 1000 IU every other day. 30 capsule 0  ? clobetasol ointment (TEMOVATE) 8.93 % Apply 1 application topically 2 (two) times daily. Then as needed for 30 days  1  ?  clotrimazole-betamethasone (LOTRISONE) cream Apply 1 application topically 2 (two) times daily. For 5-7 days 30 g 0  ? fluticasone (FLONASE) 50 MCG/ACT nasal spray Place 2 sprays into both nostrils daily.    ? hydrocortisone (PROCTOSOL HC) 2.5 % rectal cream Place 1 application rectally daily. 30 g 1  ? hydrOXYzine (ATARAX) 25 MG tablet Take 25 mg by mouth 3 (three) times daily as needed.    ? losartan (COZAAR) 50 MG tablet Take 50 mg by mouth daily.    ? montelukast (SINGULAIR) 10 MG tablet Take 10 mg by mouth at bedtime.    ? pantoprazole (PROTONIX) 40 MG tablet Take 1 tablet (40 mg total) by mouth daily. 30 tablet 1  ? rosuvastatin (CRESTOR) 10 MG tablet TAKE 1 TABLET(10 MG) BY MOUTH DAILY 30 tablet 5  ? sertraline (ZOLOFT) 50 MG tablet Take 1 tablet (50 mg total) by mouth daily. 90 tablet 0  ? ?No current facility-administered medications for this visit.  ? ? ?Family History  ?Problem Relation Age of Onset  ? Hypertension Mother   ? Breast cancer Mother 60  ? Cancer Mother   ? Alcoholism Mother   ? Drug abuse Mother   ? Hypertension Father   ? Heart disease Father   ? Heart attack Father   ? Hypertension Brother   ? Diabetes Neg Hx   ? COPD Neg Hx   ? Colon cancer Neg Hx   ? Stomach cancer Neg Hx   ? Pancreatic cancer Neg Hx   ? Esophageal cancer Neg Hx   ? Rectal cancer Neg Hx   ? ? ?Review of Systems  ?All other systems reviewed and are negative. ? ?Exam:   ?BP 120/70   Pulse 62   Ht _0  (1.676 m)   Wt 237 lb (107.5 kg)   LMP 09/13/2015 Comment: celebate, BTL  SpO2 100%   BMI 38.25 kg/m?   Weight change: _1 @ Height:   Height: _2  (167.6 cm)  ?Ht Readings from Last 3 Encounters:  ?12/08/21 _3  (1.676 m)  ?12/01/21 _4  (1.676 m)  ?11/03/21 _5  (1.676 m)  ? ? ?General appearance: alert, cooperative and appears stated age ?Abdomen: soft, non-tender; non distended,  no masses,  no organomegaly ? ? ? ?Pelvic: External genitalia:  no lesions ?             Urethra:  normal appearing urethra  with no masses, tenderness or lesions ?             Bartholins and Skenes: normal    ?  Vagina: normal appearing vagina with normal color and discharge, no prolapse. Her vagina is very well supported ?             Cervix: no cervical motion tenderness and no lesions ?              ?Bimanual Exam:  Uterus:   no masses or tenderness ?             Adnexa: no mass, fullness, tenderness ?              Rectovaginal: Confirms ?              Anus:  normal sphincter tone, no lesions ? ?Gae Dry chaperoned for the exam. ? ?1. RLQ abdominal pain ?Intermittent for several months, she hasn't noticed it recently.  ?Suspect the pain is from her constipation, but she hasn't noticed a correlation. No concerning findings on pelvic exam ?-If her pain persists, I can order an ultrasound or she can f/u with her GI ? ?2. Constipation, unspecified constipation type ?Try metamucil to try help with emptying.  ?Information on constipation given in mychart ? ? ?

## 2021-12-08 NOTE — Patient Instructions (Addendum)
Try metamucil for constipation. Start with 1 x a day and increase to 3 x a day as needed.  ? ?About Constipation ? ?Constipation Overview ?Constipation is the most common gastrointestinal complaint -- about 4 million Americans experience constipation and make 2.5 million physician visits a year to get help for the problem.  Constipation can occur when the colon absorbs too much water, the colon?s muscle contraction is slow or sluggish, and/or there is delayed transit time through the colon.  The result is stool that is hard and dry.  Indicators of constipation include straining during bowel movements greater than 25% of the time, having fewer than three bowel movements per week, and/or the feeling of incomplete evacuation. ? ?There are established guidelines ([place] II ) for defining constipation. A person needs to have two or more of the following symptoms for at least 12 weeks (not necessarily consecutive) in the preceding 12 months: ?Straining in  greater than 25% of bowel movements ?Lumpy or hard stools in greater than 25% of bowel movements ?Sensation of incomplete emptying in greater than 25% of bowel movements ?Sensation of anorectal obstruction/blockade in greater than 25% of bowel movements ?Manual maneuvers to help empty greater than 25% of bowel movements (e.g., digital evacuation, support of the pelvic floor)  ?Less than  3 bowel movements/week ?Loose stools are not present, and criteria for irritable bowel syndrome are insufficient ? ?Common Causes of Constipation ?Lack of fiber in your diet ?Lack of physical activity ?Medications, including iron and calcium supplements  ?Dairy intake ?Dehydration ?Abuse of laxatives ?Travel ?Irritable Bowel Syndrome ?Pregnancy ?Luteal phase of menstruation (after ovulation and before menses) ?Colorectal problems ?Intestinal Dysfunction ? ?Treating Constipation ? ?There are several ways of treating constipation, including changes to diet and exercise, use of laxatives,  adjustments to the pelvic floor, and scheduled toileting.  These treatments include: ?increasing fiber and fluids in the diet  ?increasing physical activity ?learning muscle coordination  ?learning proper toileting techniques and toileting modifications  ?designing and sticking  to a toileting schedule  ? ? ?? 2007, [employer] Doc.22 ? ?

## 2021-12-09 ENCOUNTER — Inpatient Hospital Stay: Payer: BC Managed Care – PPO | Attending: Hematology and Oncology | Admitting: Adult Health

## 2021-12-09 ENCOUNTER — Encounter: Payer: Self-pay | Admitting: Adult Health

## 2021-12-09 DIAGNOSIS — C50911 Malignant neoplasm of unspecified site of right female breast: Secondary | ICD-10-CM | POA: Diagnosis not present

## 2021-12-09 DIAGNOSIS — Z170 Estrogen receptor positive status [ER+]: Secondary | ICD-10-CM | POA: Diagnosis not present

## 2021-12-09 DIAGNOSIS — C50311 Malignant neoplasm of lower-inner quadrant of right female breast: Secondary | ICD-10-CM | POA: Diagnosis not present

## 2021-12-09 MED ORDER — TAMOXIFEN CITRATE 10 MG PO TABS: 10.0000 mg | ORAL_TABLET | Freq: Two times a day (BID) | ORAL | 0 refills | Status: DC

## 2021-12-09 NOTE — Progress Notes (Signed)
[facility] Follow up: ?  ? ?[name], [name], MD ?[address] ?[place] [address] ? ? ?DIAGNOSIS:  Cancer Staging  ?Malignant neoplasm of lower-inner quadrant of right breast of female, estrogen receptor positive ([place]) ?Staging form: Breast, AJCC 8th Edition ?- Clinical: Stage IB (cT1b, cN0, cM0, G3, ER+, PR-, HER2-) - Signed by Nicholas Lose, MD on 07/24/2018 ?Neoadjuvant therapy: No ?Histologic grading system: 3 grade system ?Laterality: Right ?Tumor size (mm): 6 ?- Pathologic: Stage IA (pT1b, pN0, cM0, G3, ER+, PR-, HER2-) - Signed by Gardenia Phlegm, NP on 09/04/2018 ?Histologic grading system: 3 grade system ? ?I connected with [name] [name] on 12/09/21 at  8:45 AM EDT by telephone and verified that I am speaking with the correct person using two identifiers.  ?I discussed the limitations, risks, security and privacy concerns of performing an evaluation and management service by telephone and the availability of in person appointments.  ?I also discussed with the patient that there may be a patient responsible charge related to this service. The patient expressed understanding and agreed to proceed.  ? ?Provider location: [place] ?Patient location: At her home. ? ? ?SUMMARY OF ONCOLOGIC HISTORY: ?Oncology History  ?Malignant neoplasm of lower-inner quadrant of right breast of female, estrogen receptor positive ([place])  ?07/16/2018 Initial Diagnosis  ? Pain right breast UOQ: Negative on mammogram, incidentally found right breast mass LIQ at 3:30 position measuring 0.6 cm biopsy revealed grade 3 IDC ER 40%, PR 0%, HER-2 negative, Ki-67 50%, T1bN0 stage Ib ? ?  ?07/24/2018 Cancer Staging  ? Staging form: Breast, AJCC 8th Edition ?- Clinical: Stage IB (cT1b, cN0, cM0, G3, ER+, PR-, HER2-) - Signed by Nicholas Lose, MD on 07/24/2018 ? ?  ?08/23/2018 Surgery  ? Right lumpectomy: IDC, 0.7 cm, grade 3, margins negative, 0/3 lymph nodes negative, ER 40%, PR 0%, HER-2  negative, Ki-67 50%, T1BN0 stage Ia ? ?  ?08/23/2018 Oncotype testing  ? 45/33% ?  ?09/04/2018 Cancer Staging  ? Staging form: Breast, AJCC 8th Edition ?- Pathologic: Stage IA (pT1b, pN0, cM0, G3, ER+, PR-, HER2-) - Signed by Gardenia Phlegm, NP on 09/04/2018 ? ?  ?09/25/2018 - 01/22/2019 Chemotherapy  ? Adjuvant chemotherapy: completed 4 cycles dose dense Adriamycin and Cytoxan, completed 10 cycles of Taxol, stopped early due to neuropathy ? ?  ?02/19/2019 - 04/07/2019 Radiation Therapy  ? Adjuvant XRT The patient initially received a dose of 50.4 Gy in 28 fractions to the breast using whole-breast tangent fields. This was delivered using a 3-D conformal technique. The patient then received a boost to the seroma. This delivered an additional 10 Gy in 5 fractions using a 3-field photon boost technique. The total dose was 60.4 Gy. ?  ?04/2019 -  Anti-estrogen oral therapy  ? Anastrozole switched to letrozole switched to Exemestane  ?  ? ? ?CURRENT THERAPY: observation ? ?INTERVAL HISTORY: ?[name] [name] 56 y.o. female returns for follow-up after our visit last week.  She stopped exemestane about 4-5 weeks ago under our direction due to significant back pain.  Since being off the exemestane her back pain has improved and she has been more mobile.  We are talking today to discuss antiestrogen therapy with tamoxifen.  She denies any history of blood clots. ? ? ?Patient Active Problem List  ? Diagnosis Date Noted  ? Chronic allergic conjunctivitis 12/08/2021  ? Vasomotor rhinitis 12/08/2021  ? Insulin resistance 10/21/2020  ? Gastroesophageal reflux disease 10/21/2020  ?  White coat syndrome with hypertension 10/21/2020  ? At risk for diabetes mellitus 10/21/2020  ? Constipation 09/30/2020  ? SOBOE (shortness of breath on exertion) 08/26/2020  ? Vitamin D deficiency 08/26/2020  ? Other fatigue 08/26/2020  ? Malignant neoplasm of right female breast ([place]) 08/26/2020  ? At risk for impaired metabolic function  78/24/2353  ? Elevated coronary artery calcium score 07/14/2020  ? Mixed hyperlipidemia 07/14/2020  ? Atypical chest pain 07/08/2020  ? Chronic fatigue 07/07/2020  ? Left arm pain 12/05/2019  ? Leg edema 05/31/2019  ? Port-A-Cath in place 09/25/2018  ? Malignant neoplasm of lower-inner quadrant of right breast of female, estrogen receptor positive ([place]) 07/16/2018  ? Screening cholesterol level 07/10/2017  ? Anal fissure 07/07/2014  ? Pruritic rash 06/16/2014  ? Class 2 severe obesity with serious comorbidity and body mass index (BMI) of 38.0 to 38.9 in adult [facility]) 06/16/2014  ? Primary localized osteoarthrosis, lower leg 11/18/2013  ? Bilateral knee pain 09/08/2013  ? VITAMIN B12 DEFICIENCY 02/10/2010  ? Essential hypertension 08/16/2007  ? ALLERGIC RHINITIS 08/16/2007  ? GERD 08/16/2007  ? Right upper quadrant pain 05/13/2007  ? ? ?is allergic to ibuprofen, lisinopril, dilaudid [hydromorphone], dilaudid [hydromorphone hcl], and tape. ? ?MEDICAL HISTORY: ?Past Medical History:  ?Diagnosis Date  ? Allergy   ? seasonal  ? Anal fissure   ? Back pain   ? Breast cancer ([place]) 2019  ? R BREAST IDC  ? Complication of anesthesia   ? Constipation   ? Diverticulitis   ? Fatty liver   ? GERD (gastroesophageal reflux disease) 07/07/2005  ? Heartburn   ? Hemorrhoid   ? Hepatic steatosis   ? Hypertension   ? Internal hemorrhoids   ? Joint pain   ? Malignant neoplasm of lower-inner quadrant of right breast of female, estrogen receptor positive ([place]) 07/16/2018  ? Personal history of chemotherapy   ? Personal history of radiation therapy   ? PONV (postoperative nausea and vomiting)   ? Positive H. pylori test   ? SOBOE (shortness of breath on exertion)   ? ? ?SURGICAL HISTORY: ?Past Surgical History:  ?Procedure Laterality Date  ? BREAST BIOPSY Right 07/12/2018  ? R BREAST IDC  ? BREAST LUMPECTOMY Right 08/22/2018  ? IDC  ? BREAST LUMPECTOMY WITH RADIOACTIVE SEED AND SENTINEL LYMPH NODE BIOPSY Right 08/23/2018  ? Procedure: RIGHT  BREAST LUMPECTOMY WITH RADIOACTIVE SEED AND RIGHT SENTINEL LYMPH NODE MAPPING;  Surgeon: Erroll Luna, MD;  Location: [facility];  Service: General;  Laterality: Right;  ? COLON RESECTION  2000's Dr. Hassell Done  ? Diverticulitis.  ? COLONOSCOPY    ? FOOT SURGERY Left   ? g3p2 c-section1    ? KNEE SURGERY Right   ? PORT-A-CATH REMOVAL Right 04/15/2019  ? Procedure: REMOVAL PORT-A-CATH;  Surgeon: Erroll Luna, MD;  Location: [facility];  Service: General;  Laterality: Right;  MAC AND LOCAL ANESTHETIC  ? PORTACATH PLACEMENT Right 09/24/2018  ? Procedure: INSERTION PORT-A-CATH WITH ULTRASOUND;  Surgeon: Erroll Luna, MD;  Location: [facility];  Service: General;  Laterality: Right;  ? TONSILLECTOMY    ? TUBAL LIGATION    ? ? ?SOCIAL HISTORY: ?Social History  ? ?Socioeconomic History  ? Marital status: Divorced  ?  Spouse name: Not on file  ? Number of children: 3  ? Years of education: 16  ? Highest education level: Not on file  ?Occupational History  ? Occupation: Press photographer  ?  Employer: NOT EMPLOYED  ?Tobacco  Use  ? Smoking status: Never  ? Smokeless tobacco: Never  ?Vaping Use  ? Vaping Use: Never used  ?Substance and Sexual Activity  ? Alcohol use: No  ?  Alcohol/week: 0.0 standard drinks  ? Drug use: No  ? Sexual activity: Not Currently  ?  Birth control/protection: Post-menopausal  ?Other Topics Concern  ? Not on file  ?Social History Narrative  ? [employer], [employer] - Human service/sociology. Married '2000 - 70yr/seperated. 2 boys - '82, '87, 1 dtr - '81.  ? 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   ? ?Social Determinants of Health  ? ?Financial Resource Strain: Not on file  ?Food Insecurity: Not on file  ?Transportation Needs: Not on file  ?Physical Activity: Not on file  ?Stress: Not on file  ?Social Connections: Not on file  ?Intimate Partner Violence: Not on file  ? ? ?FAMILY HISTORY: ?Family History  ?Problem Relation Age of Onset  ? Hypertension Mother   ? Breast cancer Mother [phone] ? Cancer Mother   ?  Alcoholism Mother   ? Drug abuse Mother   ? Hypertension Father   ? Heart disease Father   ? Heart attack Father   ? Hypertension Brother   ? Diabetes Neg Hx   ? COPD Neg Hx   ? Colon cancer Neg Hx   ? Stomach

## 2021-12-09 NOTE — Assessment & Plan Note (Signed)
08/23/2018:Right lumpectomy: IDC, 0.7 cm, grade 3, margins negative, 0/3 lymph nodes negative, ER 40%, PR 0%, HER-2 negative, Ki-67 50%, T1BN0 stage Ia ?? ?Oncotype Dx 44: High Risk ?? ?Treatment plan: ?1.?Adj Chemo with Dose dense adriamycin and Cytoxan foll by Taxol weekly X?10?stopped early for toxicities ?2. Adjuvant radiation therapy?02/20/2019?-03/28/2019? ?3. Adjuvant antiestrogen therapy ?------------------------------------------------------------------------------------------------------------ ?Port will be removed 04/15/2019 ?Treatment plan: Adjuvant antiestrogen therapy with anastrozole 1 mg daily, switched to letrozole, switching to exemestane started August 21, 2020, stopped November 03, 2021 due to increased back pain. ?Tamoxifen beginning December 09, 2021. ? ?[name] [name] and I had a long discussion about tamoxifen adjuvant antiestrogen therapy.  We discussed that this will help reduce her risk of recurrence especially considering her elevated Oncotype breast cancer.  She and I reviewed how tamoxifen works and that it blocks estrogen from breast cells. ? ?[name] [name] I discussed the side effects of tamoxifen in detail.  We specifically touched based on the small risk of blood clots and endometrial cancer.  She knows to let us know if she develops any swelling in her arms or legs and also if she develops any vaginal bleeding.  I gave her the recommendation to see gynecology once per year for an annual pelvic exam. ? ?After discussing the risks and benefits [name] [name] has opted to proceed with taking tamoxifen daily.  She will start at 10 mg a day and will increase after 2 weeks to 20 mg a day.  She knows to call me if she has any questions or concerns. ? ?I will see [name] [name] in follow-up in 3 months for an in person visit. ? ? ? ? ?

## 2021-12-12 ENCOUNTER — Telehealth: Payer: Self-pay | Admitting: Adult Health

## 2021-12-12 NOTE — Telephone Encounter (Signed)
Scheduled appointment per 4/21 los. Patient is aware of the upcoming appointment. ?

## 2021-12-15 ENCOUNTER — Ambulatory Visit: Payer: BC Managed Care – PPO | Admitting: Podiatry

## 2021-12-15 ENCOUNTER — Ambulatory Visit (INDEPENDENT_AMBULATORY_CARE_PROVIDER_SITE_OTHER): Payer: BC Managed Care – PPO

## 2021-12-15 ENCOUNTER — Encounter: Payer: Self-pay | Admitting: Podiatry

## 2021-12-15 DIAGNOSIS — M21619 Bunion of unspecified foot: Secondary | ICD-10-CM

## 2021-12-15 DIAGNOSIS — M779 Enthesopathy, unspecified: Secondary | ICD-10-CM | POA: Diagnosis not present

## 2021-12-15 DIAGNOSIS — M7751 Other enthesopathy of right foot: Secondary | ICD-10-CM | POA: Diagnosis not present

## 2021-12-15 MED ORDER — TRIAMCINOLONE ACETONIDE 10 MG/ML IJ SUSP
10.0000 mg | Freq: Once | INTRAMUSCULAR | Status: AC
  Administered 2021-12-15: 10 mg

## 2021-12-15 NOTE — Progress Notes (Signed)
Subjective:  ? ?Patient ID: [name] [name], female   DOB: 56 y.o.   MRN: [id number]  ? ?HPI ?Patient presents stating she has had developed a flareup around her big toe joint of her right foot and states its been sore and making it hard to wear shoe gear comfortably.  Its been going on now for about 3 weeks does not remember injury ? ? ?ROS ? ? ?   ?Objective:  ?Physical Exam  ?Neurovascular status intact no change health history inflammation pain around the first MPJ right with moderate enlargement around the surface in this area ? ?   ?Assessment:  ?Appears to be moderate structural bunion deformity with inflammatory capsulitis surrounding it ? ?   ?Plan:  ?H&P x-ray reviewed today I went ahead did sterile prep and injected the capsule of the first MPJ 3 mg dexamethasone Kenalog 5 mg Xylocaine discussed wider shoes soaks and patient may require bunion correction and I made her aware of today.  Reappoint as needed ? ?X-rays indicate there is mild elevation of the intermetatarsal angle with small amount of protuberance around the bone surface ?   ? ? ?

## 2021-12-15 NOTE — Progress Notes (Signed)
G

## 2022-01-22 ENCOUNTER — Other Ambulatory Visit: Payer: Self-pay | Admitting: Adult Health

## 2022-01-22 DIAGNOSIS — C50911 Malignant neoplasm of unspecified site of right female breast: Secondary | ICD-10-CM

## 2022-01-23 ENCOUNTER — Encounter: Payer: Self-pay | Admitting: Hematology and Oncology

## 2022-01-25 ENCOUNTER — Other Ambulatory Visit: Payer: Self-pay | Admitting: Orthopaedic Surgery

## 2022-01-25 DIAGNOSIS — M25512 Pain in left shoulder: Secondary | ICD-10-CM

## 2022-01-26 ENCOUNTER — Ambulatory Visit (INDEPENDENT_AMBULATORY_CARE_PROVIDER_SITE_OTHER): Payer: BC Managed Care – PPO | Admitting: Family Medicine

## 2022-02-06 ENCOUNTER — Inpatient Hospital Stay: Admission: RE | Admit: 2022-02-06 | Payer: BC Managed Care – PPO | Source: Ambulatory Visit

## 2022-02-09 ENCOUNTER — Ambulatory Visit (INDEPENDENT_AMBULATORY_CARE_PROVIDER_SITE_OTHER): Payer: BC Managed Care – PPO | Admitting: Family Medicine

## 2022-02-11 ENCOUNTER — Ambulatory Visit
Admission: RE | Admit: 2022-02-11 | Discharge: 2022-02-11 | Disposition: A | Discharge status: Home / Self Care | Payer: BC Managed Care – PPO | Source: Ambulatory Visit | Attending: Orthopaedic Surgery | Admitting: Orthopaedic Surgery

## 2022-02-11 DIAGNOSIS — M25512 Pain in left shoulder: Secondary | ICD-10-CM

## 2022-02-23 ENCOUNTER — Other Ambulatory Visit: Payer: Self-pay | Admitting: Adult Health

## 2022-02-23 DIAGNOSIS — C50911 Malignant neoplasm of unspecified site of right female breast: Secondary | ICD-10-CM

## 2022-02-26 ENCOUNTER — Other Ambulatory Visit (INDEPENDENT_AMBULATORY_CARE_PROVIDER_SITE_OTHER): Payer: Self-pay | Admitting: Family Medicine

## 2022-02-26 DIAGNOSIS — F32A Depression, unspecified: Secondary | ICD-10-CM

## 2022-03-02 ENCOUNTER — Encounter (INDEPENDENT_AMBULATORY_CARE_PROVIDER_SITE_OTHER): Payer: Self-pay | Admitting: Family Medicine

## 2022-03-02 ENCOUNTER — Ambulatory Visit (INDEPENDENT_AMBULATORY_CARE_PROVIDER_SITE_OTHER): Payer: BC Managed Care – PPO | Admitting: Family Medicine

## 2022-03-02 VITALS — BP 146/85 | HR 100 | Temp 98.5°F | Ht 66.0 in | Wt 237.0 lb

## 2022-03-02 DIAGNOSIS — F419 Anxiety disorder, unspecified: Secondary | ICD-10-CM

## 2022-03-02 DIAGNOSIS — E669 Obesity, unspecified: Secondary | ICD-10-CM

## 2022-03-02 DIAGNOSIS — E8881 Metabolic syndrome: Secondary | ICD-10-CM

## 2022-03-02 DIAGNOSIS — F32A Depression, unspecified: Secondary | ICD-10-CM | POA: Diagnosis not present

## 2022-03-02 DIAGNOSIS — Z6838 Body mass index (BMI) 38.0-38.9, adult: Secondary | ICD-10-CM

## 2022-03-02 MED ORDER — SERTRALINE HCL 50 MG PO TABS: 50.0000 mg | ORAL_TABLET | Freq: Every day | ORAL | 0 refills | Status: DC

## 2022-03-07 NOTE — Progress Notes (Signed)
Chief Complaint:   OBESITY [name] [name] is here to discuss her progress with her obesity treatment plan along with follow-up of her obesity related diagnoses. [name] [name] is on the Category 3 Plan and states she is following her eating plan approximately 0% of the time. [name] [name] states she is walking 30 minutes 5 times per week.  Today's visit was #: 20 Starting weight: 237 lbs Starting date: 08/26/2020 Today's weight: 237 Today's date: 03/02/2022 Total lbs lost to date: 0 lbs Total lbs lost since last in-office visit: 0  Interim History: [name] [name] did not like Cat 3 plan--did not like quantity of food on it. Eats mostly shrimp, [place] and chicken. With the heat outside [name] [name] would rather not eat such heavy food.  Subjective:   1. Insulin resistance [name] [name]'s last A1c was 5.4, insulin 18.1. She is not currently on medications.  2. Anxiety and depression [name] [name] is currently on Sertraline with good improvement in symptoms. Denies suicidal ideas, and homicidal ideas.  Assessment/Plan:   1. Insulin resistance We will obtain labs in September 2023.  2. Anxiety and depression We will refill Sertraline 50 mg by mouth daily for 3 months with 0 refills.  -Refill sertraline (ZOLOFT) 50 MG tablet; Take 1 tablet (50 mg total) by mouth daily.  Dispense: 90 tablet; Refill: 0  3. Obesity with current BMI of 38.3 [name] [name] is currently in the action stage of change. As such, her goal is to continue with weight loss efforts. She has agreed to the [place] +300.  Exercise goals: All adults should avoid inactivity. Some physical activity is better than none, and adults who participate in any amount of physical activity gain some health benefits.  Behavioral modification strategies: increasing lean protein intake, decreasing simple carbohydrates, and keeping healthy foods in the home.  [name] [name] has agreed to follow-up with our clinic in 4 weeks. She was informed of the  importance of frequent follow-up visits to maximize her success with intensive lifestyle modifications for her multiple health conditions.   Objective:   Blood pressure (!) 146/85, pulse 100, temperature 98.5 F (36.9 C), height '5\' 6"'$  (1.676 m), weight 237 lb (107.5 kg), last menstrual period 09/13/2015, SpO2 99 %. Body mass index is 38.25 kg/m.  General: Cooperative, alert, well developed, in no acute distress. HEENT: Conjunctivae and lids unremarkable. Cardiovascular: Regular rhythm.  Lungs: Normal work of breathing. Neurologic: No focal deficits.   Lab Results  Component Value Date   CREATININE 0.81 12/01/2021   BUN 15 12/01/2021   NA 138 12/01/2021   K 4.0 12/01/2021   CL 104 12/01/2021   CO2 23 12/01/2021   Lab Results  Component Value Date   ALT 88 (H) 12/01/2021   AST 63 (H) 12/01/2021   ALKPHOS 106 12/01/2021   BILITOT 0.4 12/01/2021   Lab Results  Component Value Date   HGBA1C 5.4 12/01/2021   HGBA1C 5.4 04/28/2021   HGBA1C 5.4 08/26/2020   Lab Results  Component Value Date   INSULIN 18.1 12/01/2021   INSULIN 21.0 04/28/2021   INSULIN 20.0 08/26/2020   Lab Results  Component Value Date   TSH 1.440 08/26/2020   Lab Results  Component Value Date   CHOL 195 12/01/2021   HDL 52 12/01/2021   LDLCALC 124 (H) 12/01/2021   TRIG 107 12/01/2021   CHOLHDL 3.2 04/28/2021   Lab Results  Component Value Date   VD25OH 58.3 12/01/2021   VD25OH 70.4 04/28/2021   VD25OH 46.9 08/26/2020   Lab Results  Component Value Date   WBC 4.6 04/28/2021   HGB 12.9 04/28/2021   HCT 40.1 04/28/2021   MCV 94 04/28/2021   PLT 129 (L) 04/28/2021   Lab Results  Component Value Date   IRON 76 04/27/2009   FERRITIN 30.7 04/27/2009   Attestation Statements:   Reviewed by clinician on day of visit: allergies, medications, problem list, medical history, surgical history, family history, social history, and previous encounter notes.  I, Elnora Morrison, RMA am acting as  transcriptionist for Coralie Common, MD.  I have reviewed the above documentation for accuracy and completeness, and I agree with the above. - Coralie Common, MD

## 2022-03-10 ENCOUNTER — Encounter: Payer: Self-pay | Admitting: Adult Health

## 2022-03-10 ENCOUNTER — Other Ambulatory Visit: Payer: Self-pay

## 2022-03-10 ENCOUNTER — Inpatient Hospital Stay: Payer: BC Managed Care – PPO | Attending: Hematology and Oncology | Admitting: Adult Health

## 2022-03-10 ENCOUNTER — Telehealth: Payer: Self-pay

## 2022-03-10 ENCOUNTER — Inpatient Hospital Stay: Payer: BC Managed Care – PPO

## 2022-03-10 VITALS — BP 155/75 | HR 91 | Temp 97.7°F | Wt 241.0 lb

## 2022-03-10 DIAGNOSIS — R748 Abnormal levels of other serum enzymes: Secondary | ICD-10-CM

## 2022-03-10 DIAGNOSIS — C50311 Malignant neoplasm of lower-inner quadrant of right female breast: Secondary | ICD-10-CM

## 2022-03-10 DIAGNOSIS — Z79810 Long term (current) use of selective estrogen receptor modulators (SERMs): Secondary | ICD-10-CM | POA: Insufficient documentation

## 2022-03-10 DIAGNOSIS — C50911 Malignant neoplasm of unspecified site of right female breast: Secondary | ICD-10-CM

## 2022-03-10 DIAGNOSIS — Z923 Personal history of irradiation: Secondary | ICD-10-CM | POA: Diagnosis not present

## 2022-03-10 DIAGNOSIS — Z170 Estrogen receptor positive status [ER+]: Secondary | ICD-10-CM | POA: Diagnosis not present

## 2022-03-10 LAB — CMP (CANCER CENTER ONLY)
ALT: 14 U/L (ref 0–44)
AST: 22 U/L (ref 15–41)
Albumin: 4.2 g/dL (ref 3.5–5.0)
Alkaline Phosphatase: 63 U/L (ref 38–126)
Anion gap: 5 (ref 5–15)
BUN: 13 mg/dL (ref 6–20)
CO2: 29 mmol/L (ref 22–32)
Calcium: 9.4 mg/dL (ref 8.9–10.3)
Chloride: 105 mmol/L (ref 98–111)
Creatinine: 0.86 mg/dL (ref 0.44–1.00)
GFR, Estimated: >60 mL/min (ref >60)
Glucose, Bld: 99 mg/dL (ref 70–99)
Potassium: 3.8 mmol/L (ref 3.5–5.1)
Sodium: 139 mmol/L (ref 135–145)
Total Bilirubin: 0.5 mg/dL (ref 0.3–1.2)
Total Protein: 7.2 g/dL (ref 6.5–8.1)

## 2022-03-10 NOTE — Assessment & Plan Note (Signed)
[name] [name] is a 56 year old woman with stage Ia estrogen positive breast cancer status postlumpectomy, adjuvant chemotherapy, and antiestrogen therapy that has been changed from anastrozole, to letrozole, to exemestane, now taking tamoxifen 10 mg daily.  [name] [name] is doing moderately well today.  She struggled when she increased to tamoxifen 20 mg daily so I instructed her to go back down to the 10 mg daily since she had extreme difficulty tolerating it at the higher dose.  Once she is comfortable with 10 mg for few months she we will try again to go back up to the 20 mg.  We discussed that her recent lab work that she had me review from [facility] showed a slight elevation in her liver enzymes.  While she is here today we will recheck CMP for follow-up.  She also had some right breast changes.  I do notice when she is sitting up that her right breast is puckering she said this is worse than it normally is.  I have placed orders for mammogram and ultrasound of the breast center to be completed within the next week for further evaluation.  [name] [name] will return in 6 months for continued follow-up and surveillance.

## 2022-03-10 NOTE — Telephone Encounter (Signed)
Per NP, pt has been scheduled for diagnostic MM at the breast center for 7/25 at 1520. Pt knows to arrive at 1500 for check in.

## 2022-03-10 NOTE — Progress Notes (Signed)
[facility] Follow up:    [name] Sake, MD [place] [address]   DIAGNOSIS:  Cancer Staging  Malignant neoplasm of lower-inner quadrant of right breast of female, estrogen receptor positive ([place]) Staging form: Breast, AJCC 8th Edition - Clinical: Stage IB (cT1b, cN0, cM0, G3, ER+, PR-, HER2-) - Signed by Nicholas Lose, MD on 07/24/2018 Neoadjuvant therapy: No Histologic grading system: 3 grade system Laterality: Right Tumor size (mm): 6 - Pathologic: Stage IA (pT1b, pN0, cM0, G3, ER+, PR-, HER2-) - Signed by Gardenia Phlegm, NP on 09/04/2018 Histologic grading system: 3 grade system   SUMMARY OF ONCOLOGIC HISTORY: Oncology History  Malignant neoplasm of lower-inner quadrant of right breast of female, estrogen receptor positive ([place])  07/16/2018 Initial Diagnosis   Pain right breast UOQ: Negative on mammogram, incidentally found right breast mass LIQ at 3:30 position measuring 0.6 cm biopsy revealed grade 3 IDC ER 40%, PR 0%, HER-2 negative, Ki-67 50%, T1bN0 stage Ib   07/24/2018 Cancer Staging   Staging form: Breast, AJCC 8th Edition - Clinical: Stage IB (cT1b, cN0, cM0, G3, ER+, PR-, HER2-) - Signed by Nicholas Lose, MD on 07/24/2018   08/23/2018 Surgery   Right lumpectomy: IDC, 0.7 cm, grade 3, margins negative, 0/3 lymph nodes negative, ER 40%, PR 0%, HER-2 negative, Ki-67 50%, T1BN0 stage Ia   08/23/2018 Oncotype testing   45/33%   09/04/2018 Cancer Staging   Staging form: Breast, AJCC 8th Edition - Pathologic: Stage IA (pT1b, pN0, cM0, G3, ER+, PR-, HER2-) - Signed by Gardenia Phlegm, NP on 09/04/2018   09/25/2018 - 01/22/2019 Chemotherapy   Adjuvant chemotherapy: completed 4 cycles dose dense Adriamycin and Cytoxan, completed 10 cycles of Taxol, stopped early due to neuropathy    02/19/2019 - 04/07/2019 Radiation Therapy   Adjuvant XRT The patient initially received a dose of 50.4 Gy in 28 fractions to the breast  using whole-breast tangent fields. This was delivered using a 3-D conformal technique. The patient then received a boost to the seroma. This delivered an additional 10 Gy in 5 fractions using a 3-field photon boost technique. The total dose was 60.4 Gy.   04/2019 -  Anti-estrogen oral therapy   Anastrozole switched to letrozole switched to Exemestane      CURRENT THERAPY: Tamoxifen  INTERVAL HISTORY: [name] [name] 56 y.o. female returns for evaluation of tamoxifen tolerance.  She did better on 70m than 248m  She noted with increased dosage she developed increased achiness all over, vaginal discharge.  Her vaginal discharge is not itching or with funny color or odor.  She fell at [employer] and injured her shoulder.  MRI of her left shoulder was completed on February 12, 2022 showing moderate acromioclavicular osteoarthritis with joint capsular thickening and subacromial osteophytes with impingement of supraspinatus tendon.  Moderate supraspinatus tendinosis with partial-thickness approximately 50% articular surface tear near the footprint, full-thickness cartilage loss and subchondral cyst measuring approximately 1 cm of the anteroinferior glenoid, no evidence of fracture or osteonecrosis, no muscle atrophy.  She tells me that in September she is preparing for shoulder surgery.  She underwent breast mammogram on 09/29/2021 that showed no evidence of malignancy and breast density category B. She notes right bresat indentation and she thinks it has been there since surgery, however seh wants to be sure it isn't worsening.   [facility] and a slight increase in her LFTs was noted with  the AST at 63 and ALT at 88.  [name] [name] tells me she was unaware of this and has not had her labs since rechecked.   Patient Active Problem List   Diagnosis Date Noted   Chronic allergic conjunctivitis 12/08/2021   Vasomotor rhinitis 12/08/2021   Insulin  resistance 10/21/2020   Gastroesophageal reflux disease 10/21/2020   White coat syndrome with hypertension 10/21/2020   At risk for diabetes mellitus 10/21/2020   Constipation 09/30/2020   SOBOE (shortness of breath on exertion) 08/26/2020   Vitamin D deficiency 08/26/2020   Other fatigue 08/26/2020   At risk for impaired metabolic function 56/31/4970   Elevated coronary artery calcium score 07/14/2020   Mixed hyperlipidemia 07/14/2020   Atypical chest pain 07/08/2020   Chronic fatigue 07/07/2020   Left arm pain 12/05/2019   Leg edema 05/31/2019   Port-A-Cath in place 09/25/2018   Malignant neoplasm of lower-inner quadrant of right breast of female, estrogen receptor positive ([place]) 07/16/2018   Screening cholesterol level 07/10/2017   Anal fissure 07/07/2014   Pruritic rash 06/16/2014   Class 2 severe obesity with serious comorbidity and body mass index (BMI) of 38.0 to 38.9 in adult [facility]) 06/16/2014   Primary localized osteoarthrosis, lower leg 11/18/2013   Bilateral knee pain 09/08/2013   VITAMIN B12 DEFICIENCY 02/10/2010   Essential hypertension 08/16/2007   ALLERGIC RHINITIS 08/16/2007   GERD 08/16/2007   Right upper quadrant pain 05/13/2007    is allergic to ibuprofen, lisinopril, dilaudid [hydromorphone], dilaudid [hydromorphone hcl], and tape.  MEDICAL HISTORY: Past Medical History:  Diagnosis Date   Allergy    seasonal   Anal fissure    Back pain    Breast cancer ([place]) 2019   R BREAST IDC   Complication of anesthesia    Constipation    Diverticulitis    Fatty liver    GERD (gastroesophageal reflux disease) 07/07/2005   Heartburn    Hemorrhoid    Hepatic steatosis    Hypertension    Internal hemorrhoids    Joint pain    Malignant neoplasm of lower-inner quadrant of right breast of female, estrogen receptor positive ([place]) 07/16/2018   Personal history of chemotherapy    Personal history of radiation therapy    PONV (postoperative nausea and vomiting)     Positive H. pylori test    SOBOE (shortness of breath on exertion)     SURGICAL HISTORY: Past Surgical History:  Procedure Laterality Date   BREAST BIOPSY Right 07/12/2018   R BREAST IDC   BREAST LUMPECTOMY Right 08/22/2018   IDC   BREAST LUMPECTOMY WITH RADIOACTIVE SEED AND SENTINEL LYMPH NODE BIOPSY Right 08/23/2018   Procedure: RIGHT BREAST LUMPECTOMY WITH RADIOACTIVE SEED AND RIGHT SENTINEL LYMPH NODE MAPPING;  Surgeon: Erroll Luna, MD;  Location: [facility];  Service: General;  Laterality: Right;   COLON RESECTION  2000's Dr. Hassell Done   Diverticulitis.   COLONOSCOPY     FOOT SURGERY Left    g3p2 c-section1     KNEE SURGERY Right    PORT-A-CATH REMOVAL Right 04/15/2019   Procedure: REMOVAL PORT-A-CATH;  Surgeon: Erroll Luna, MD;  Location: [facility];  Service: General;  Laterality: Right;  MAC AND LOCAL ANESTHETIC   PORTACATH PLACEMENT Right 09/24/2018   Procedure: INSERTION PORT-A-CATH WITH ULTRASOUND;  Surgeon: Erroll Luna, MD;  Location: [facility];  Service: General;  Laterality: Right;   TONSILLECTOMY     TUBAL LIGATION      SOCIAL HISTORY: Social History  Socioeconomic History   Marital status: Divorced    Spouse name: Not on file   Number of children: 3   Years of education: 14   Highest education level: Not on file  Occupational History   Occupation: Scientist, 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 Married '2000 - 75yr/seperated. 2 boys - '82, '87, 1 dtr - '81.   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    Social Determinants of Health   Financial Resource  Strain: Not on file  Food Insecurity: Not on file  Transportation Needs: Not on file  Physical Activity: Not on file  Stress: Not on file  Social Connections: Not on file  Intimate Partner Violence: Not on file    FAMILY HISTORY: Family History  Problem Relation Age of Onset   Hypertension Mother    Breast cancer Mother 763  Cancer Mother    Alcoholism Mother    Drug abuse Mother    Hypertension Father    Heart disease Father    Heart attack Father    Hypertension Brother    Diabetes Neg Hx    COPD Neg Hx    Colon cancer Neg Hx    Stomach cancer Neg Hx    Pancreatic cancer Neg Hx    Esophageal cancer Neg Hx    Rectal cancer Neg Hx     Review of Systems  Constitutional:  Negative for appetite change, chills, fatigue, fever and unexpected weight change.  HENT:   Negative for hearing loss, lump/mass and trouble swallowing.   Eyes:  Negative for eye problems and icterus.  Respiratory:  Negative for chest tightness, cough and shortness of breath.   Cardiovascular:  Negative for chest pain, leg swelling and palpitations.  Gastrointestinal:  Negative for abdominal distention, abdominal pain, constipation, diarrhea, nausea and vomiting.  Endocrine: Negative for hot flashes.  Genitourinary:  Positive for vaginal discharge (As per interval history). Negative for difficulty urinating.   Musculoskeletal:  Positive for arthralgias.  Skin:  Negative for itching and rash.  Neurological:  Negative for dizziness, extremity weakness, headaches and numbness.  Hematological:  Negative for adenopathy. Does not bruise/bleed easily.  Psychiatric/Behavioral:  Negative for depression. The patient is not nervous/anxious.      PHYSICAL EXAMINATION  ECOG PERFORMANCE STATUS: 1 - Symptomatic but completely ambulatory  Vitals:   03/10/22 0928  BP: (!) 155/75  Pulse: 91  Temp: 97.7 F (36.5 C)  SpO2: 100%    Physical Exam Constitutional:      General: She is not in acute distress.     Appearance: Normal appearance. She is not toxic-appearing.  HENT:     Head: Normocephalic and atraumatic.  Eyes:     General: No scleral icterus. Cardiovascular:     Rate and Rhythm: Normal rate and regular rhythm.     Pulses: Normal pulses.     Heart sounds: Normal heart sounds.  Pulmonary:     Effort: Pulmonary effort is normal.     Breath sounds: Normal breath sounds.  Chest:     Comments: Left  breast benign, right breast s/p lumpectomy and radiation, slight puckering noted in right breast above her nipple.   Abdominal:     General: Abdomen is flat. Bowel sounds are normal. There is no distension.     Palpations: Abdomen is soft.     Tenderness: There is no abdominal tenderness.  Musculoskeletal:        General: No swelling.     Cervical back: Neck supple.  Lymphadenopathy:     Cervical: No cervical adenopathy.  Skin:    General: Skin is warm and dry.     Findings: No rash.  Neurological:     General: No focal deficit present.     Mental Status: She is alert.  Psychiatric:        Mood and Affect: Mood normal.        Behavior: Behavior normal.     LABORATORY DATA:  CBC    Component Value Date/Time   WBC 4.6 04/28/2021 1109   WBC 5.8 07/06/2020 2348   RBC 4.29 04/28/2021 1109   RBC 4.32 07/06/2020 2348   HGB 12.9 04/28/2021 1109   HCT 40.1 04/28/2021 1109   PLT 129 (L) 04/28/2021 1109   MCV 94 04/28/2021 1109   MCH 30.1 04/28/2021 1109   MCH 29.6 07/06/2020 2348   MCHC 32.2 04/28/2021 1109   MCHC 32.4 07/06/2020 2348   RDW 12.8 04/28/2021 1109   LYMPHSABS 1.2 04/28/2021 1109   MONOABS 0.4 07/06/2020 2348   EOSABS 0.1 04/28/2021 1109   BASOSABS 0.0 04/28/2021 1109    CMP     Component Value Date/Time   NA 138 12/01/2021 0814   K 4.0 12/01/2021 0814   CL 104 12/01/2021 0814   CO2 23 12/01/2021 0814   GLUCOSE 95 12/01/2021 0814   GLUCOSE 103 (H) 07/06/2020 2348   BUN 15 12/01/2021 0814   CREATININE 0.81 12/01/2021 0814   CREATININE 0.89 01/29/2019  1010   CALCIUM 9.3 12/01/2021 0814   PROT 6.9 12/01/2021 0814   ALBUMIN 4.3 12/01/2021 0814   AST 63 (H) 12/01/2021 0814   AST 24 01/29/2019 1010   ALT 88 (H) 12/01/2021 0814   ALT 26 01/29/2019 1010   ALKPHOS 106 12/01/2021 0814   BILITOT 0.4 12/01/2021 0814   BILITOT 0.3 01/29/2019 1010   GFRNONAA 77 08/26/2020 1121   GFRNONAA >60 07/06/2020 2348   GFRNONAA >60 01/29/2019 1010   GFRAA 89 08/26/2020 1121   GFRAA >60 01/29/2019 1010           ASSESSMENT and THERAPY PLAN:   Malignant neoplasm of lower-inner quadrant of right breast of female, estrogen receptor positive ([place]) [name] [name] is a 56 year old woman with stage Ia estrogen positive breast cancer status postlumpectomy, adjuvant chemotherapy, and antiestrogen therapy that has been changed from anastrozole, to letrozole, to exemestane, now taking tamoxifen 10 mg daily.  [name] [name] is doing moderately well today.  She struggled when she increased to tamoxifen 20 mg daily so I instructed her to go back down to the 10 mg daily since she had extreme difficulty tolerating it at the higher dose.  Once she is comfortable with 10 mg for few months she we will try again to go back up to the 20 mg.  We discussed that her recent lab work that she had me review from [facility] showed a slight elevation in her liver enzymes.  While she is here today we will recheck CMP for follow-up.  She also had some right breast changes.  I do notice when she is sitting up that her right breast is puckering she said this is worse than it normally is.  I have placed orders for mammogram and ultrasound of the breast center to be completed within the next week for further evaluation.  [name] [name] will return in 6 months for continued follow-up and surveillance.     All questions were answered. The patient knows to call the clinic with any problems, questions or concerns. We can certainly see the patient much sooner if necessary.  Total  encounter time:30 minutes*in face-to-face visit time, chart review, lab review, care coordination, order entry, and documentation of the encounter time.    Wilber Bihari, NP 03/10/22 10:18 AM Medical Oncology and Hematology [facility] [place], [place] [address] Tel. [phone]    Fax. [phone]  *Total Encounter Time as defined by the Centers for Medicare and Medicaid Services includes, in addition to the face-to-face time of a patient visit (documented in the note above) non-face-to-face time: obtaining and reviewing outside history, ordering and reviewing medications, tests or procedures, care coordination (communications with other health care professionals or caregivers) and documentation in the medical record.

## 2022-03-14 ENCOUNTER — Ambulatory Visit
Admission: RE | Admit: 2022-03-14 | Discharge: 2022-03-14 | Disposition: A | Discharge status: Home / Self Care | Payer: BC Managed Care – PPO | Source: Ambulatory Visit | Attending: Adult Health | Admitting: Adult Health

## 2022-03-14 ENCOUNTER — Telehealth: Payer: Self-pay | Admitting: Adult Health

## 2022-03-14 DIAGNOSIS — C50911 Malignant neoplasm of unspecified site of right female breast: Secondary | ICD-10-CM

## 2022-03-14 NOTE — Telephone Encounter (Signed)
Called patient to schedule a follow up with [name] [name] in 6 months per [facility] 7/24 los. Patient did not want to see Delice Bison because she is already scheduled to see Dr.Gudena in February 2024 and only wants to see him. No appointments were made nor changed.

## 2022-03-29 ENCOUNTER — Encounter (INDEPENDENT_AMBULATORY_CARE_PROVIDER_SITE_OTHER): Payer: Self-pay

## 2022-03-30 ENCOUNTER — Encounter (INDEPENDENT_AMBULATORY_CARE_PROVIDER_SITE_OTHER): Payer: Self-pay | Admitting: Family Medicine

## 2022-03-30 ENCOUNTER — Ambulatory Visit (INDEPENDENT_AMBULATORY_CARE_PROVIDER_SITE_OTHER): Payer: BC Managed Care – PPO | Admitting: Family Medicine

## 2022-03-30 VITALS — BP 136/75 | HR 105 | Temp 98.3°F | Ht 66.0 in | Wt 232.0 lb

## 2022-03-30 DIAGNOSIS — R7401 Elevation of levels of liver transaminase levels: Secondary | ICD-10-CM

## 2022-03-30 DIAGNOSIS — E669 Obesity, unspecified: Secondary | ICD-10-CM

## 2022-03-30 DIAGNOSIS — I10 Essential (primary) hypertension: Secondary | ICD-10-CM | POA: Diagnosis not present

## 2022-03-30 DIAGNOSIS — Z6837 Body mass index (BMI) 37.0-37.9, adult: Secondary | ICD-10-CM

## 2022-03-30 DIAGNOSIS — E66812 Obesity, class 2: Secondary | ICD-10-CM

## 2022-04-04 ENCOUNTER — Telehealth: Payer: Self-pay | Admitting: Nurse Practitioner

## 2022-04-04 ENCOUNTER — Telehealth: Payer: Self-pay | Admitting: Psychology

## 2022-04-04 ENCOUNTER — Telehealth: Payer: Self-pay | Admitting: Physician Assistant

## 2022-04-04 NOTE — Telephone Encounter (Signed)
Caller Name: Chareese Sergent Call back phone #: [phone]  Reason for Call: Was referred to you, would like to receive a call to possible schedule a first appt

## 2022-04-04 NOTE — Telephone Encounter (Signed)
Patient called requested to speak with a nurse regarding her hemorrhoids that are now on the outside.

## 2022-04-04 NOTE — Telephone Encounter (Signed)
Pt states she is doing everything she knows to do for hemorrhoids but nothing is helping. Referral previously made to [facility] for hemorrhoids and [place] [facility] stated they attempted to contact pt 3 times but pt never called back to schedule. Let pt know she can contact [facility] to schedule appointment. Pt verbalized understanding and had no other concerns at end of call.

## 2022-04-04 NOTE — Telephone Encounter (Signed)
Error

## 2022-04-06 NOTE — Progress Notes (Signed)
Chief Complaint:   OBESITY [name] [name] is here to discuss her progress with her obesity treatment plan along with follow-up of her obesity related diagnoses. [name] [name] is on the [employer] +300 and states she is following her eating plan approximately 70% of the time. [name] [name] states she is walking/gym 30/20 minutes 3/1 times per week.  Today's visit was#: 21 Starting weight: 237 lbs Starting date: 08/26/2020 Today's weight: 232 lbs Today's date: 03/30/2022 Total lbs lost to date: 5 lbs Total lbs lost since last in-office visit: 5  Interim History: [name] [name] was referred for EMDR and has called a few places for treatment. For breakfast she is doing PB and bread and milk and fruit. Lunch is a sandwich and dinner is chicken and veggies. Using beef jerky and mozzarella cheese as snack calories without upcoming plans for next few weeks.  Subjective:   1. Essential hypertension [name] [name]'s blood pressure is well controlled. Denies chest pain, chest pressure and headache. She is currently taking Cozaar and Norvasc.  2. Transaminitis Labs discussed during visit today. [name] [name]'s LFT's elevated in April 2023. Recent LFT's substantial improved.  Assessment/Plan:   1. Essential hypertension [name] [name] will continue with current medications without any changes in dose.  2. Transaminitis We will obtain labs in Sept/Oct.  3. Obesity with current BMI of 37.5 [name] [name] is currently in the action stage of change. As such, her goal is to continue with weight loss efforts. She has agreed to the [place] +300.  Exercise goals: All adults should avoid inactivity. Some physical activity is better than none, and adults who participate in any amount of physical activity gain some health benefits.  Behavioral modification strategies: increasing lean protein intake, meal planning and cooking strategies, keeping healthy foods in the home, and planning for success.  [name] [name] has agreed  to follow-up with our clinic in 4 weeks. She was informed of the importance of frequent follow-up visits to maximize her success with intensive lifestyle modifications for her multiple health conditions.   Objective:   Blood pressure 136/75, pulse (!) 105, temperature 98.3 F (36.8 C), height '5\' 6"'$  (1.676 m), weight 232 lb (105.2 kg), last menstrual period 09/13/2015, SpO2 100 %. Body mass index is 37.45 kg/m.  General: Cooperative, alert, well developed, in no acute distress. HEENT: Conjunctivae and lids unremarkable. Cardiovascular: Regular rhythm.  Lungs: Normal work of breathing. Neurologic: No focal deficits.   Lab Results  Component Value Date   CREATININE 0.86 03/10/2022   BUN 13 03/10/2022   NA 139 03/10/2022   K 3.8 03/10/2022   CL 105 03/10/2022   CO2 29 03/10/2022   Lab Results  Component Value Date   ALT 14 03/10/2022   AST 22 03/10/2022   ALKPHOS 63 03/10/2022   BILITOT 0.5 03/10/2022   Lab Results  Component Value Date   HGBA1C 5.4 12/01/2021   HGBA1C 5.4 04/28/2021   HGBA1C 5.4 08/26/2020   Lab Results  Component Value Date   INSULIN 18.1 12/01/2021   INSULIN 21.0 04/28/2021   INSULIN 20.0 08/26/2020   Lab Results  Component Value Date   TSH 1.440 08/26/2020   Lab Results  Component Value Date   CHOL 195 12/01/2021   HDL 52 12/01/2021   LDLCALC 124 (H) 12/01/2021   TRIG 107 12/01/2021   CHOLHDL 3.2 04/28/2021   Lab Results  Component Value Date   VD25OH 58.3 12/01/2021   VD25OH 70.4 04/28/2021   VD25OH 46.9 08/26/2020   Lab Results  Component Value Date  WBC 4.6 04/28/2021   HGB 12.9 04/28/2021   HCT 40.1 04/28/2021   MCV 94 04/28/2021   PLT 129 (L) 04/28/2021   Lab Results  Component Value Date   IRON 76 04/27/2009   FERRITIN 30.7 04/27/2009    Attestation Statements:   Reviewed by clinician on day of visit: allergies, medications, problem list, medical history, surgical history, family history, social history, and previous  encounter notes.  I, Elnora Morrison, RMA am acting as transcriptionist for Coralie Common, MD.  I have reviewed the above documentation for accuracy and completeness, and I agree with the above. - Coralie Common, MD

## 2022-04-27 ENCOUNTER — Encounter (INDEPENDENT_AMBULATORY_CARE_PROVIDER_SITE_OTHER): Payer: Self-pay | Admitting: Family Medicine

## 2022-04-27 ENCOUNTER — Ambulatory Visit (INDEPENDENT_AMBULATORY_CARE_PROVIDER_SITE_OTHER): Payer: BC Managed Care – PPO | Admitting: Family Medicine

## 2022-04-27 VITALS — BP 143/83 | HR 86 | Temp 97.7°F | Ht 66.0 in | Wt 229.0 lb

## 2022-04-27 DIAGNOSIS — E559 Vitamin D deficiency, unspecified: Secondary | ICD-10-CM | POA: Diagnosis not present

## 2022-04-27 DIAGNOSIS — Z6837 Body mass index (BMI) 37.0-37.9, adult: Secondary | ICD-10-CM

## 2022-04-27 DIAGNOSIS — E7849 Other hyperlipidemia: Secondary | ICD-10-CM

## 2022-04-27 DIAGNOSIS — E8881 Metabolic syndrome: Secondary | ICD-10-CM

## 2022-04-27 DIAGNOSIS — I10 Essential (primary) hypertension: Secondary | ICD-10-CM | POA: Diagnosis not present

## 2022-04-27 DIAGNOSIS — E669 Obesity, unspecified: Secondary | ICD-10-CM

## 2022-04-28 LAB — HEMOGLOBIN A1C
Est. average glucose Bld gHb Est-mCnc: 111 mg/dL
Hgb A1c MFr Bld: 5.5 % (ref 4.8–5.6)

## 2022-04-28 LAB — INSULIN, RANDOM: INSULIN: 13.4 u[IU]/mL (ref 2.6–24.9)

## 2022-04-28 LAB — LIPID PANEL WITH LDL/HDL RATIO
Cholesterol, Total: 193 mg/dL (ref 100–199)
HDL: 44 mg/dL (ref >39)
LDL Chol Calc (NIH): 129 mg/dL — ABNORMAL HIGH (ref 0–99)
LDL/HDL Ratio: 2.9 ratio (ref 0.0–3.2)
Triglycerides: 113 mg/dL (ref 0–149)
VLDL Cholesterol Cal: 20 mg/dL (ref 5–40)

## 2022-04-28 LAB — VITAMIN D 25 HYDROXY (VIT D DEFICIENCY, FRACTURES): Vit D, 25-Hydroxy: 45.4 ng/mL (ref 30.0–100.0)

## 2022-04-28 LAB — COMPREHENSIVE METABOLIC PANEL
ALT: 18 IU/L (ref 0–32)
AST: 23 IU/L (ref 0–40)
Albumin/Globulin Ratio: 1.8 (ref 1.2–2.2)
Albumin: 4.4 g/dL (ref 3.8–4.9)
Alkaline Phosphatase: 76 IU/L (ref 44–121)
BUN/Creatinine Ratio: 12 (ref 9–23)
BUN: 9 mg/dL (ref 6–24)
Bilirubin Total: 0.4 mg/dL (ref 0.0–1.2)
CO2: 21 mmol/L (ref 20–29)
Calcium: 8.9 mg/dL (ref 8.7–10.2)
Chloride: 105 mmol/L (ref 96–106)
Creatinine, Ser: 0.78 mg/dL (ref 0.57–1.00)
Globulin, Total: 2.4 g/dL (ref 1.5–4.5)
Glucose: 75 mg/dL (ref 70–99)
Potassium: 3.8 mmol/L (ref 3.5–5.2)
Sodium: 142 mmol/L (ref 134–144)
Total Protein: 6.8 g/dL (ref 6.0–8.5)
eGFR: 88 mL/min/{1.73_m2} (ref >59)

## 2022-05-02 NOTE — Progress Notes (Signed)
Chief Complaint:   OBESITY [name] [name] is here to discuss her progress with her obesity treatment plan along with follow-up of her obesity related diagnoses. [name] [name] is on the [employer] +300 and states she is following her eating plan approximately 80% of the time. [name] [name] states she is walking 45 minutes 3 times per week.  Today's visit was #: 22 Starting weight: 237 lbs Starting date: 08/26/2020 Today's weight: 229 lbs Today's date: 04/27/2022 Total lbs lost to date: 8 lbs Total lbs lost since last in-office visit: 3  Interim History: [name] [name] has scheduled appointment today for EMDR [name] [name]). Foodwise she has been doing 2 eggs for breakfast with toast, Lunch is sandwich with [place] and dinner is PB&J sandwich. Snacks is mozzarella sticks and beef jerkey.  Subjective:   1. Essential hypertension [name] [name]'s blood pressure slightly elevated today. Denies chest pain, chest pressure and headache.  2. Other hyperlipidemia [name] [name] is on Crestor 10 mg daily. Last LDL 124, HDL 52, Trigly 107 with no myalgias or transaminitis.  3. Vitamin D deficiency [name] [name] is on over the counter Vit D. Denies any nausea, vomiting or muscle weakness. Notes fatigue.  4. Insulin resistance [name] [name]'s last A1c 5.4, insulin 18. Not currently on medications.  Assessment/Plan:   1. Essential hypertension We will obtain labs today. Continue taking Cozaar and Norvasc.  - Comprehensive metabolic panel  2. Other hyperlipidemia We will obtain labs today.  - Lipid Panel With LDL/HDL Ratio  3. Vitamin D deficiency We will obtain labs today.  - VITAMIN D 25 Hydroxy (Vit-D Deficiency, Fractures)  4. Insulin resistance We will obtain labs today.  - Hemoglobin A1c - Insulin, random  5. Obesity with current BMI of 37.0 [name] [name] is currently in the action stage of change. As such, her goal is to continue with weight loss efforts. She has agreed to the [employer]+300.  Exercise goals: All adults should avoid inactivity. Some physical activity is better than none, and adults who participate in any amount of physical activity gain some health benefits.  Behavioral modification strategies: increasing lean protein intake, meal planning and cooking strategies, keeping healthy foods in the home, and planning for success.  [name] [name] has agreed to follow-up with our clinic in 4 weeks. She was informed of the importance of frequent follow-up visits to maximize her success with intensive lifestyle modifications for her multiple health conditions.   [name] [name] was informed we would discuss her lab results at her next visit unless there is a critical issue that needs to be addressed sooner. [name] [name] agreed to keep her next visit at the agreed upon time to discuss these results.  Objective:   Blood pressure (!) 143/83, pulse 86, temperature 97.7 F (36.5 C), height '5\' 6"'$  (1.676 m), weight 229 lb (103.9 kg), last menstrual period 09/13/2015, SpO2 100 %. Body mass index is 36.96 kg/m.  General: Cooperative, alert, well developed, in no acute distress. HEENT: Conjunctivae and lids unremarkable. Cardiovascular: Regular rhythm.  Lungs: Normal work of breathing. Neurologic: No focal deficits.   Lab Results  Component Value Date   CREATININE 0.78 04/27/2022   BUN 9 04/27/2022   NA 142 04/27/2022   K 3.8 04/27/2022   CL 105 04/27/2022   CO2 21 04/27/2022   Lab Results  Component Value Date   ALT 18 04/27/2022   AST 23 04/27/2022   ALKPHOS 76 04/27/2022   BILITOT 0.4 04/27/2022   Lab Results  Component Value Date   HGBA1C 5.5 04/27/2022   HGBA1C  5.4 12/01/2021   HGBA1C 5.4 04/28/2021   HGBA1C 5.4 08/26/2020   Lab Results  Component Value Date   INSULIN 13.4 04/27/2022   INSULIN 18.1 12/01/2021   INSULIN 21.0 04/28/2021   INSULIN 20.0 08/26/2020   Lab Results  Component Value Date   TSH 1.440 08/26/2020   Lab Results  Component Value  Date   CHOL 193 04/27/2022   HDL 44 04/27/2022   LDLCALC 129 (H) 04/27/2022   TRIG 113 04/27/2022   CHOLHDL 3.2 04/28/2021   Lab Results  Component Value Date   VD25OH 45.4 04/27/2022   VD25OH 58.3 12/01/2021   VD25OH 70.4 04/28/2021   Lab Results  Component Value Date   WBC 4.6 04/28/2021   HGB 12.9 04/28/2021   HCT 40.1 04/28/2021   MCV 94 04/28/2021   PLT 129 (L) 04/28/2021   Lab Results  Component Value Date   IRON 76 04/27/2009   FERRITIN 30.7 04/27/2009   Attestation Statements:   Reviewed by clinician on day of visit: allergies, medications, problem list, medical history, surgical history, family history, social history, and previous encounter notes.  I, Elnora Morrison, RMA am acting as transcriptionist for Coralie Common, MD.  I have reviewed the above documentation for accuracy and completeness, and I agree with the above. - Coralie Common, MD

## 2022-05-18 HISTORY — PX: SHOULDER SURGERY: SHX246

## 2022-05-25 ENCOUNTER — Ambulatory Visit (INDEPENDENT_AMBULATORY_CARE_PROVIDER_SITE_OTHER): Payer: BC Managed Care – PPO | Admitting: Family Medicine

## 2022-05-25 ENCOUNTER — Encounter (INDEPENDENT_AMBULATORY_CARE_PROVIDER_SITE_OTHER): Payer: Self-pay | Admitting: Family Medicine

## 2022-05-25 VITALS — BP 139/84 | HR 98 | Temp 98.4°F | Ht 66.0 in | Wt 225.0 lb

## 2022-05-25 DIAGNOSIS — Z6836 Body mass index (BMI) 36.0-36.9, adult: Secondary | ICD-10-CM

## 2022-05-25 DIAGNOSIS — E669 Obesity, unspecified: Secondary | ICD-10-CM | POA: Diagnosis not present

## 2022-05-25 DIAGNOSIS — E559 Vitamin D deficiency, unspecified: Secondary | ICD-10-CM | POA: Diagnosis not present

## 2022-05-25 DIAGNOSIS — I10 Essential (primary) hypertension: Secondary | ICD-10-CM

## 2022-05-25 MED ORDER — VITAMIN D 50 MCG (2000 UT) PO CAPS: 1.0000 | ORAL_CAPSULE | Freq: Every day | ORAL | 0 refills | Status: AC

## 2022-05-29 NOTE — Progress Notes (Deleted)
56 y.o. G47P2003 Divorced Black or African American Not Hispanic or Latino female here for annual exam.      Patient's last menstrual period was 09/13/2015.          Sexually active: {yes no:314532}  The current method of family planning is {contraception:315051}.    Exercising: {yes no:314532}  {types:19826} Smoker:  {YES P5382123  Health Maintenance: Pap:  Pap 04-16-18 normal neg HPV History of abnormal Pap:  {YES NO:22349} MMG:   09-29-21 normal BMD:   DEXA 10-15-19 normal Colonoscopy: 2019 normal TDaP:  2007*** Gardasil: n/a   reports that she has never smoked. She has never used smokeless tobacco. She reports that she does not drink alcohol and does not use drugs.  Past Medical History:  Diagnosis Date   Allergy    seasonal   Anal fissure    Back pain    Breast cancer ([place]) 2019   R BREAST IDC   Complication of anesthesia    Constipation    Diverticulitis    Fatty liver    GERD (gastroesophageal reflux disease) 07/07/2005   Heartburn    Hemorrhoid    Hepatic steatosis    Hypertension    Internal hemorrhoids    Joint pain    Malignant neoplasm of lower-inner quadrant of right breast of female, estrogen receptor positive ([place]) 07/16/2018   Personal history of chemotherapy    Personal history of radiation therapy    PONV (postoperative nausea and vomiting)    Positive H. pylori test    SOBOE (shortness of breath on exertion)     Past Surgical History:  Procedure Laterality Date   BREAST BIOPSY Right 07/12/2018   R BREAST IDC   BREAST LUMPECTOMY Right 08/22/2018   IDC   BREAST LUMPECTOMY WITH RADIOACTIVE SEED AND SENTINEL LYMPH NODE BIOPSY Right 08/23/2018   Procedure: RIGHT BREAST LUMPECTOMY WITH RADIOACTIVE SEED AND RIGHT SENTINEL LYMPH NODE MAPPING;  Surgeon: Erroll Luna, MD;  Location: [facility];  Service: General;  Laterality: Right;   COLON RESECTION  2000's Dr. Hassell Done   Diverticulitis.   COLONOSCOPY     FOOT SURGERY Left    g3p2 c-section1     KNEE  SURGERY Right    PORT-A-CATH REMOVAL Right 04/15/2019   Procedure: REMOVAL PORT-A-CATH;  Surgeon: Erroll Luna, MD;  Location: [facility];  Service: General;  Laterality: Right;  MAC AND LOCAL ANESTHETIC   PORTACATH PLACEMENT Right 09/24/2018   Procedure: INSERTION PORT-A-CATH WITH ULTRASOUND;  Surgeon: Erroll Luna, MD;  Location: [facility];  Service: General;  Laterality: Right;   TONSILLECTOMY     TUBAL LIGATION      Current Outpatient Medications  Medication Sig Dispense Refill   AMBULATORY NON FORMULARY MEDICATION Medication Name: Diltiazem 2%/Lidocaine 2%   Using your index finger apply a small amount of medication inside the anal opening and to the external anal area three times daily x 6-8 weeks. 30 g 1   amLODipine (NORVASC) 5 MG tablet Take 5 mg by mouth daily.     azelastine (OPTIVAR) 0.05 % ophthalmic solution Place 1 drop into both eyes 2 (two) times daily.     cetirizine (ZYRTEC) 10 MG tablet Take 10 mg by mouth daily.     Cholecalciferol (VITAMIN D) 50 MCG (2000 UT) CAPS Take 1 capsule (2,000 Units total) by mouth daily. 30 capsule 0   clobetasol ointment (TEMOVATE) 6.04 % Apply 1 application  topically 2 (two) times daily. Then as needed for 30 days  1   clotrimazole-betamethasone (LOTRISONE) cream Apply 1 application topically 2 (two) times daily. For 5-7 days 30 g 0   fluticasone (FLONASE) 50 MCG/ACT nasal spray Place 2 sprays into both nostrils daily.     hydrocortisone (PROCTOSOL HC) 2.5 % rectal cream Place 1 application rectally daily. 30 g 1   hydrOXYzine (ATARAX) 25 MG tablet Take 25 mg by mouth 3 (three) times daily as needed.     losartan (COZAAR) 50 MG tablet Take 50 mg by mouth daily.     montelukast (SINGULAIR) 10 MG tablet Take 10 mg by mouth at bedtime.     pantoprazole (PROTONIX) 40 MG tablet Take 1 tablet (40 mg total) by mouth daily. 30 tablet 1   rosuvastatin (CRESTOR) 10 MG tablet TAKE 1 TABLET(10 MG) BY MOUTH DAILY 30  tablet 5   sertraline (ZOLOFT) 50 MG tablet Take 1 tablet (50 mg total) by mouth daily. 90 tablet 0   tamoxifen (NOLVADEX) 10 MG tablet Take 1 tablet (10 mg total) by mouth 2 (two) times daily. (Patient taking differently: Take 10 mg by mouth daily.) 90 tablet 3   No current facility-administered medications for this visit.    Family History  Problem Relation Age of Onset   Hypertension Mother    Breast cancer Mother 66   Cancer Mother    Alcoholism Mother    Drug abuse Mother    Hypertension Father    Heart disease Father    Heart attack Father    Hypertension Brother    Diabetes Neg Hx    COPD Neg Hx    Colon cancer Neg Hx    Stomach cancer Neg Hx    Pancreatic cancer Neg Hx    Esophageal cancer Neg Hx    Rectal cancer Neg Hx     Review of Systems  Exam:   LMP 09/13/2015 Comment: celebate, BTL  Weight change: _0 @ Height:      Ht Readings from Last 3 Encounters:  05/25/22 _1  (1.676 m)  04/27/22 _2  (1.676 m)  03/30/22 _3  (1.676 m)    General appearance: alert, cooperative and appears stated age Head: Normocephalic, without obvious abnormality, atraumatic Neck: no adenopathy, supple, symmetrical, trachea midline and thyroid {CHL AMB PHY EX THYROID NORM DEFAULT:[phone]::"normal to inspection and palpation"} Lungs: clear to auscultation bilaterally Cardiovascular: regular rate and rhythm Breasts: {Exam; breast:13139::"normal appearance, no masses or tenderness"} Abdomen: soft, non-tender; non distended,  no masses,  no organomegaly Extremities: extremities normal, atraumatic, no cyanosis or edema Skin: Skin color, texture, turgor normal. No rashes or lesions Lymph nodes: Cervical, supraclavicular, and axillary nodes normal. No abnormal inguinal nodes palpated Neurologic: Grossly normal   Pelvic: External genitalia:  no lesions              Urethra:  normal appearing urethra with no masses, tenderness or lesions              Bartholins and  Skenes: normal                 Vagina: normal appearing vagina with normal color and discharge, no lesions              Cervix: {CHL AMB PHY EX CERVIX NORM DEFAULT:[phone]::"no lesions"}               Bimanual Exam:  Uterus:  {CHL AMB PHY EX UTERUS NORM DEFAULT:[phone]::"normal size, contour, position, consistency, mobility, non-tender"}  Adnexa: {CHL AMB PHY EX ADNEXA NO MASS [phone]::"no mass, fullness, tenderness"}               Rectovaginal: Confirms               Anus:  normal sphincter tone, no lesions  *** chaperoned for the exam.  A:  Well Woman with normal exam  P:

## 2022-05-29 NOTE — Progress Notes (Signed)
Chief Complaint:   OBESITY [name] [name] is here to discuss her progress with her obesity treatment plan along with follow-up of her obesity related diagnoses. [name] [name] is on the [employer] and states she is following her eating plan approximately 50% of the time. Prima states she is exercising 0 minutes 0 times per week.  Today's visit was #: 23 Starting weight: 237 lbs Starting date: 08/26/2020 Today's weight: 225 lbs Today's date: 05/25/2022 Total lbs lost to date: 12 lbs Total lbs lost since last in-office visit: 5  Interim History: Helen had shoulder surgery at [facility] about 1 week ago. Has not gone back to work yet. Has not been really eating due to decrease in appetite. She has been eating once a day and drinking a protein shake [employer]). Finding out if she needs to do physical therapy at her Ortho appointment next week.  Subjective:   1. Essential hypertension [name] [name]'s blood pressure well controlled. On Norvasc and Cozaar. Denies chest pain, chest pressure and headache.  2. Vitamin D deficiency [name] [name] is on alternating 2k anf 1k. Denies any nausea, vomiting or muscle weakness. Notes fatigue.   Assessment/Plan:   1. Essential hypertension Continue current medications without any changes in dose.  2. Vitamin D deficiency We will refill Vit D 50 mcg daily.  -Refill Cholecalciferol (VITAMIN D) 50 MCG (2000 UT) CAPS; Take 1 capsule (2,000 Units total) by mouth daily.  Dispense: 30 capsule; Refill: 0  3. Obesity with current BMI of 36.4 [name] [name] is currently in the action stage of change. As such, her goal is to continue with weight loss efforts. She has agreed to the [employer].   Exercise goals: All adults should avoid inactivity. Some physical activity is better than none, and adults who participate in any amount of physical activity gain some health benefits.  Behavioral modification strategies: increasing lean protein intake,  meal planning and cooking strategies, and keeping healthy foods in the home.  [name] [name] has agreed to follow-up with our clinic in 4 weeks. She was informed of the importance of frequent follow-up visits to maximize her success with intensive lifestyle modifications for her multiple health conditions.   Objective:   Blood pressure 139/84, pulse 98, temperature 98.4 F (36.9 C), height '5\' 6"'$  (1.676 m), weight 225 lb (102.1 kg), last menstrual period 09/13/2015, SpO2 99 %. Body mass index is 36.32 kg/m.  General: Cooperative, alert, well developed, in no acute distress. HEENT: Conjunctivae and lids unremarkable. Cardiovascular: Regular rhythm.  Lungs: Normal work of breathing. Neurologic: No focal deficits.   Lab Results  Component Value Date   CREATININE 0.78 04/27/2022   BUN 9 04/27/2022   NA 142 04/27/2022   K 3.8 04/27/2022   CL 105 04/27/2022   CO2 21 04/27/2022   Lab Results  Component Value Date   ALT 18 04/27/2022   AST 23 04/27/2022   ALKPHOS 76 04/27/2022   BILITOT 0.4 04/27/2022   Lab Results  Component Value Date   HGBA1C 5.5 04/27/2022   HGBA1C 5.4 12/01/2021   HGBA1C 5.4 04/28/2021   HGBA1C 5.4 08/26/2020   Lab Results  Component Value Date   INSULIN 13.4 04/27/2022   INSULIN 18.1 12/01/2021   INSULIN 21.0 04/28/2021   INSULIN 20.0 08/26/2020   Lab Results  Component Value Date   TSH 1.440 08/26/2020   Lab Results  Component Value Date   CHOL 193 04/27/2022   HDL 44 04/27/2022   LDLCALC 129 (H) 04/27/2022   TRIG  113 04/27/2022   CHOLHDL 3.2 04/28/2021   Lab Results  Component Value Date   VD25OH 45.4 04/27/2022   VD25OH 58.3 12/01/2021   VD25OH 70.4 04/28/2021   Lab Results  Component Value Date   WBC 4.6 04/28/2021   HGB 12.9 04/28/2021   HCT 40.1 04/28/2021   MCV 94 04/28/2021   PLT 129 (L) 04/28/2021   Lab Results  Component Value Date   IRON 76 04/27/2009   FERRITIN 30.7 04/27/2009   Attestation Statements:   Reviewed by  clinician on day of visit: allergies, medications, problem list, medical history, surgical history, family history, social history, and previous encounter notes.  I, Elnora Morrison, RMA am acting as transcriptionist for Coralie Common, MD.  I have reviewed the above documentation for accuracy and completeness, and I agree with the above. - Coralie Common, MD

## 2022-05-30 NOTE — Progress Notes (Unsigned)
56 y.o. G47P2003 Divorced Black or [place] American Not Hispanic or Latino female here for annual exam.  Patient is taking Tamoxifen. She started the tamoxifen ~4 months ago and she notices an increase in white vaginal discharge, no itching, burning, irritation or odor. Not sexually active.    H/O right breast cancer, diagnosed in 11/19. Treated with lumpectomy, chemo and radiation.   Abdominal pain has improved. BM's are normal.  No bladder c/o.   She had left shoulder surgery recently.   Stressful couple of years, adopted daughter OD'd 2 years ago, grandson was killed last year. Dealing with depression, currently better than it has been. In counseling.   She has 2 sons, and one daughter currently.   Patient's last menstrual period was 09/13/2015.          Sexually active: No.  The current method of family planning is post menopausal status.    Exercising: Yes.     Walking  Smoker:  no  Health Maintenance: Pap:  04/16/18 Wnl HR hpv neg  History of abnormal Pap:  no MMG:  09/29/21 density B Bi-rads 1 neg  BMD:   none  Colonoscopy: 08/28/17 f/u 10 years  TDaP:  2022 per patient  Gardasil: n/a   reports that she has never smoked. She has never used smokeless tobacco. She reports that she does not drink alcohol and does not use drugs. 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 3 grown kids, 2 living grandchildren. Oldest (25 year old) grandson was killed in the fall of 2022.  Past Medical History:  Diagnosis Date   Allergy    seasonal   Anal fissure    Back pain    Breast cancer ([facility]) 2019   R BREAST IDC   Complication of anesthesia    Constipation    Diverticulitis    Fatty liver    GERD (gastroesophageal reflux disease) 07/07/2005   Heartburn    Hemorrhoid    Hepatic steatosis    Hypertension    Internal hemorrhoids    Joint pain    Malignant neoplasm of lower-inner quadrant of right breast of female, estrogen receptor positive ([facility]) 07/16/2018   Personal history of chemotherapy    Personal  history of radiation therapy    PONV (postoperative nausea and vomiting)    Positive H. pylori test    SOBOE (shortness of breath on exertion)     Past Surgical History:  Procedure Laterality Date   BREAST BIOPSY Right 07/12/2018   R BREAST IDC   BREAST LUMPECTOMY Right 08/22/2018   IDC   BREAST LUMPECTOMY WITH RADIOACTIVE SEED AND SENTINEL LYMPH NODE BIOPSY Right 08/23/2018   Procedure: RIGHT BREAST LUMPECTOMY WITH RADIOACTIVE SEED AND RIGHT SENTINEL LYMPH NODE MAPPING;  Surgeon: Erroll Luna, MD;  Location: [facility];  Service: General;  Laterality: Right;   COLON RESECTION  2000's Dr. Hassell Done   Diverticulitis.   COLONOSCOPY     FOOT SURGERY Left    g3p2 c-section1     KNEE SURGERY Right    PORT-A-CATH REMOVAL Right 04/15/2019   Procedure: REMOVAL PORT-A-CATH;  Surgeon: Erroll Luna, MD;  Location: [facility];  Service: General;  Laterality: Right;  MAC AND LOCAL ANESTHETIC   PORTACATH PLACEMENT Right 09/24/2018   Procedure: INSERTION PORT-A-CATH WITH ULTRASOUND;  Surgeon: Erroll Luna, MD;  Location: [facility];  Service: General;  Laterality: Right;   TONSILLECTOMY     TUBAL LIGATION      Current Outpatient Medications  Medication Sig Dispense Refill  AMBULATORY NON FORMULARY MEDICATION Medication Name: Diltiazem 2%/Lidocaine 2%   Using your index finger apply a small amount of medication inside the anal opening and to the external anal area three times daily x 6-8 weeks. 30 g 1   amLODipine (NORVASC) 5 MG tablet Take 5 mg by mouth daily.     azelastine (OPTIVAR) 0.05 % ophthalmic solution Place 1 drop into both eyes 2 (two) times daily.     cetirizine (ZYRTEC) 10 MG tablet Take 10 mg by mouth daily.     Cholecalciferol (VITAMIN D) 50 MCG (2000 UT) CAPS Take 1 capsule (2,000 Units total) by mouth daily. 30 capsule 0   clobetasol ointment (TEMOVATE) 1.30 % Apply 1 application  topically 2 (two) times daily. Then as needed for 30 days  1    clotrimazole-betamethasone (LOTRISONE) cream Apply 1 application topically 2 (two) times daily. For 5-7 days 30 g 0   fluticasone (FLONASE) 50 MCG/ACT nasal spray Place 2 sprays into both nostrils daily.     hydrocortisone (PROCTOSOL HC) 2.5 % rectal cream Place 1 application rectally daily. 30 g 1   hydrOXYzine (ATARAX) 25 MG tablet Take 25 mg by mouth 3 (three) times daily as needed.     losartan (COZAAR) 50 MG tablet Take 50 mg by mouth daily.     montelukast (SINGULAIR) 10 MG tablet Take 10 mg by mouth at bedtime.     pantoprazole (PROTONIX) 40 MG tablet Take 1 tablet (40 mg total) by mouth daily. 30 tablet 1   rosuvastatin (CRESTOR) 10 MG tablet TAKE 1 TABLET(10 MG) BY MOUTH DAILY 30 tablet 5   sertraline (ZOLOFT) 50 MG tablet Take 1 tablet (50 mg total) by mouth daily. 90 tablet 0   tamoxifen (NOLVADEX) 10 MG tablet Take 1 tablet (10 mg total) by mouth 2 (two) times daily. (Patient taking differently: Take 10 mg by mouth daily.) 90 tablet 3   No current facility-administered medications for this visit.    Family History  Problem Relation Age of Onset   Hypertension Mother    Breast cancer Mother 45   Cancer Mother    Alcoholism Mother    Drug abuse Mother    Hypertension Father    Heart disease Father    Heart attack Father    Hypertension Brother    Diabetes Neg Hx    COPD Neg Hx    Colon cancer Neg Hx    Stomach cancer Neg Hx    Pancreatic cancer Neg Hx    Esophageal cancer Neg Hx    Rectal cancer Neg Hx     Review of Systems  All other systems reviewed and are negative.   Exam:   BP 138/82   Pulse 66   Ht _0  (1.676 m) Comment: patient reported due to braids  Wt 229 lb (103.9 kg)   LMP 09/13/2015 Comment: celebate, BTL  SpO2 99%   BMI 36.96 kg/m   Weight change: _1 @ Height:   Height: _2  (167.6 cm) (patient reported due to braids)  Ht Readings from Last 3 Encounters:  05/31/22 _3  (1.676 m)  05/25/22 _4  (1.676 m)  04/27/22 _5  (1.676 m)     General appearance: alert, cooperative and appears stated age Head: Normocephalic, without obvious abnormality, atraumatic Neck: no adenopathy, supple, symmetrical, trachea midline and thyroid normal to inspection and palpation Lungs: clear to auscultation bilaterally Cardiovascular: regular rate and rhythm Breasts: normal appearance, no masses or tenderness Abdomen: soft, non-tender; non distended,  no masses,  no organomegaly Extremities: extremities normal, atraumatic, no cyanosis or edema Skin: Skin color, texture, turgor normal. No rashes or lesions Lymph nodes: Cervical, supraclavicular, and axillary nodes normal. No abnormal inguinal nodes palpated Neurologic: Grossly normal   Pelvic: External genitalia:  no lesions              Urethra:  normal appearing urethra with no masses, tenderness or lesions              Bartholins and Skenes: normal                 Vagina: normal appearing vagina with normal color and discharge, no lesions              Cervix: no lesions               Bimanual Exam:  Uterus:   no masses or tenderness              Adnexa: no mass, fullness, tenderness               Rectovaginal: Confirms               Anus:  normal sphincter tone, no lesions  Gae Dry, CMA chaperoned for the exam.  1. Well woman exam Discussed breast self exam Discussed calcium and vit D intake Mammogram in 2/24 Colonoscopy UTD Labs UTD  2. Screening for cervical cancer - Cytology - PAP  3. Malignant neoplasm of lower-inner quadrant of right breast of female, estrogen receptor positive ([facility]) On Tamoxifen

## 2022-05-31 ENCOUNTER — Other Ambulatory Visit (HOSPITAL_COMMUNITY)
Admission: RE | Admit: 2022-05-31 | Discharge: 2022-05-31 | Disposition: A | Discharge status: Home / Self Care | Payer: BC Managed Care – PPO | Source: Ambulatory Visit | Attending: Obstetrics and Gynecology | Admitting: Obstetrics and Gynecology

## 2022-05-31 ENCOUNTER — Ambulatory Visit (INDEPENDENT_AMBULATORY_CARE_PROVIDER_SITE_OTHER): Payer: BC Managed Care – PPO | Admitting: Obstetrics and Gynecology

## 2022-05-31 ENCOUNTER — Encounter: Payer: Self-pay | Admitting: Obstetrics and Gynecology

## 2022-05-31 VITALS — BP 138/82 | HR 66 | Ht 66.0 in | Wt 229.0 lb

## 2022-05-31 DIAGNOSIS — Z124 Encounter for screening for malignant neoplasm of cervix: Secondary | ICD-10-CM

## 2022-05-31 DIAGNOSIS — C50311 Malignant neoplasm of lower-inner quadrant of right female breast: Secondary | ICD-10-CM

## 2022-05-31 DIAGNOSIS — Z01419 Encounter for gynecological examination (general) (routine) without abnormal findings: Secondary | ICD-10-CM | POA: Diagnosis not present

## 2022-05-31 DIAGNOSIS — Z170 Estrogen receptor positive status [ER+]: Secondary | ICD-10-CM

## 2022-05-31 NOTE — Patient Instructions (Signed)

## 2022-06-01 ENCOUNTER — Other Ambulatory Visit: Payer: Self-pay | Admitting: Cardiology

## 2022-06-01 ENCOUNTER — Ambulatory Visit: Payer: BC Managed Care – PPO | Admitting: Obstetrics and Gynecology

## 2022-06-01 DIAGNOSIS — E782 Mixed hyperlipidemia: Secondary | ICD-10-CM

## 2022-06-01 LAB — CYTOLOGY - PAP
Adequacy: ABSENT
Comment: NEGATIVE
Diagnosis: NEGATIVE
High risk HPV: NEGATIVE

## 2022-06-29 ENCOUNTER — Encounter (INDEPENDENT_AMBULATORY_CARE_PROVIDER_SITE_OTHER): Payer: Self-pay | Admitting: Family Medicine

## 2022-06-29 ENCOUNTER — Ambulatory Visit (INDEPENDENT_AMBULATORY_CARE_PROVIDER_SITE_OTHER): Payer: BC Managed Care – PPO | Admitting: Family Medicine

## 2022-06-29 VITALS — BP 134/82 | HR 98 | Temp 98.2°F | Ht 66.0 in | Wt 229.0 lb

## 2022-06-29 DIAGNOSIS — F419 Anxiety disorder, unspecified: Secondary | ICD-10-CM | POA: Diagnosis not present

## 2022-06-29 DIAGNOSIS — I10 Essential (primary) hypertension: Secondary | ICD-10-CM

## 2022-06-29 DIAGNOSIS — F32A Depression, unspecified: Secondary | ICD-10-CM | POA: Diagnosis not present

## 2022-06-29 DIAGNOSIS — E669 Obesity, unspecified: Secondary | ICD-10-CM

## 2022-06-29 DIAGNOSIS — Z6837 Body mass index (BMI) 37.0-37.9, adult: Secondary | ICD-10-CM

## 2022-06-29 MED ORDER — SERTRALINE HCL 50 MG PO TABS: 50.0000 mg | ORAL_TABLET | Freq: Every day | ORAL | 1 refills | Status: DC

## 2022-07-05 NOTE — Progress Notes (Signed)
Chief Complaint:   OBESITY [name] [name] is here to discuss her progress with her obesity treatment plan along with follow-up of her obesity related diagnoses. [name] [name] is on the [employer] and states she is following her eating plan approximately 60% of the time. [name] [name] states she is exercising 0 minutes 0 times per week.  Today's visit was #: 24 Starting weight: 237 lbs Starting date: 08/26/2020 Today's weight: 229 lbs Today's date: 06/29/2022 Total lbs lost to date: 8 lbs Total lbs lost since last in-office visit: 0  Interim History: [name] [name] is getting over another sinus infection and just finished a course of Prednisone. She went to grocery store and restocked all food on [employer]. Noticed an increase in sugar cravings while on Prednisone. Going to aunt's for Thanksgiving.  Subjective:   1. Essential hypertension [name] [name]'s blood pressure controlled today. Denies chest pain, chest pressure and headache.  2. Anxiety and depression [name] [name] is doing better with symptom control on Sertraline. Still seeing counselor and does feel improved. Denies suicidal ideas, and homicidal ideas.  Assessment/Plan:   1. Essential hypertension Continue current medications without changes in dose.  2. Anxiety and depression We will refill Zoloft 50 mg by mouth daily for 3 months with 0 refills.  -Refill sertraline (ZOLOFT) 50 MG tablet; Take 1 tablet (50 mg total) by mouth daily.  Dispense: 90 tablet; Refill: 1  3. Obesity with current BMI of 37.1 [name] [name] is currently in the action stage of change. As such, her goal is to continue with weight loss efforts. She has agreed to the [employer].   Exercise goals: No exercise has been prescribed at this time.  Behavioral modification strategies: increasing lean protein intake, meal planning and cooking strategies, keeping healthy foods in the home, and planning for success.  [name] [name] has agreed to follow-up with our  clinic in 4 weeks. She was informed of the importance of frequent follow-up visits to maximize her success with intensive lifestyle modifications for her multiple health conditions.   Objective:   Blood pressure 134/82, pulse 98, temperature 98.2 F (36.8 C), height '5\' 6"'$  (1.676 m), weight 229 lb (103.9 kg), last menstrual period 09/13/2015, SpO2 100 %. Body mass index is 36.96 kg/m.  General: Cooperative, alert, well developed, in no acute distress. HEENT: Conjunctivae and lids unremarkable. Cardiovascular: Regular rhythm.  Lungs: Normal work of breathing. Neurologic: No focal deficits.   Lab Results  Component Value Date   CREATININE 0.78 04/27/2022   BUN 9 04/27/2022   NA 142 04/27/2022   K 3.8 04/27/2022   CL 105 04/27/2022   CO2 21 04/27/2022   Lab Results  Component Value Date   ALT 18 04/27/2022   AST 23 04/27/2022   ALKPHOS 76 04/27/2022   BILITOT 0.4 04/27/2022   Lab Results  Component Value Date   HGBA1C 5.5 04/27/2022   HGBA1C 5.4 12/01/2021   HGBA1C 5.4 04/28/2021   HGBA1C 5.4 08/26/2020   Lab Results  Component Value Date   INSULIN 13.4 04/27/2022   INSULIN 18.1 12/01/2021   INSULIN 21.0 04/28/2021   INSULIN 20.0 08/26/2020   Lab Results  Component Value Date   TSH 1.440 08/26/2020   Lab Results  Component Value Date   CHOL 193 04/27/2022   HDL 44 04/27/2022   LDLCALC 129 (H) 04/27/2022   TRIG 113 04/27/2022   CHOLHDL 3.2 04/28/2021   Lab Results  Component Value Date   VD25OH 45.4 04/27/2022   VD25OH 58.3 12/01/2021   VD25OH  70.4 04/28/2021   Lab Results  Component Value Date   WBC 4.6 04/28/2021   HGB 12.9 04/28/2021   HCT 40.1 04/28/2021   MCV 94 04/28/2021   PLT 129 (L) 04/28/2021   Lab Results  Component Value Date   IRON 76 04/27/2009   FERRITIN 30.7 04/27/2009   Attestation Statements:   Reviewed by clinician on day of visit: allergies, medications, problem list, medical history, surgical history, family history, social  history, and previous encounter notes.  I, Elnora [facility], RMA am acting as transcriptionist for Coralie Common, MD. I have reviewed the above documentation for accuracy and completeness, and I agree with the above. - Coralie Common, MD

## 2022-07-24 ENCOUNTER — Other Ambulatory Visit: Payer: Self-pay | Admitting: Orthopaedic Surgery

## 2022-07-24 DIAGNOSIS — M75100 Unspecified rotator cuff tear or rupture of unspecified shoulder, not specified as traumatic: Secondary | ICD-10-CM

## 2022-07-24 DIAGNOSIS — M25512 Pain in left shoulder: Secondary | ICD-10-CM

## 2022-07-27 ENCOUNTER — Encounter (INDEPENDENT_AMBULATORY_CARE_PROVIDER_SITE_OTHER): Payer: Self-pay | Admitting: Family Medicine

## 2022-07-27 ENCOUNTER — Ambulatory Visit (INDEPENDENT_AMBULATORY_CARE_PROVIDER_SITE_OTHER): Payer: BC Managed Care – PPO | Admitting: Family Medicine

## 2022-07-27 VITALS — BP 143/80 | HR 93 | Temp 98.7°F | Ht 66.0 in | Wt 229.0 lb

## 2022-07-27 DIAGNOSIS — E669 Obesity, unspecified: Secondary | ICD-10-CM

## 2022-07-27 DIAGNOSIS — Z6837 Body mass index (BMI) 37.0-37.9, adult: Secondary | ICD-10-CM | POA: Diagnosis not present

## 2022-07-27 DIAGNOSIS — I10 Essential (primary) hypertension: Secondary | ICD-10-CM

## 2022-07-27 DIAGNOSIS — E559 Vitamin D deficiency, unspecified: Secondary | ICD-10-CM

## 2022-08-09 NOTE — Progress Notes (Signed)
Chief Complaint:   OBESITY [name] [name] is here to discuss her progress with her obesity treatment plan along with follow-up of her obesity related diagnoses. [name] is on the [employer] and states she is following her eating plan approximately 70% of the time. [name] [name] states she is walking 30 minutes 3 times per week.  Today's visit was #: 25 Starting weight: 237 lbs Starting date: 08/26/2020 Today's weight: 229 lbs Today's date: 07/27/2022 Total lbs lost to date: 8 lbs Total lbs lost since last in-office visit: 0  Interim History: [name] [name] cooked and stayed local for Thanksgiving.  She switched eggs to egg whites instead.  Does an apple, bowl of soup and water for lunch.  Supper is soup--Progressive chicken noodle or salad.  Wanted to go somewhere tropical for Christmas or early next month.  Subjective:   1. Essential hypertension [place]'s blood pressure minimally elevated. On Norvasc, Losartan.  2. Vitamin D deficiency [name] [name] is on 2K IU daily. Denies any nausea, vomiting or muscle weakness. She notes fatigue.  Assessment/Plan:   1. Essential hypertension Continue medications without changes in dose or medications.  2. Vitamin D deficiency Continue over the counter without changes in dose. Recheck labs in Feb.  3. Obesity with current BMI of 37.1 [name] [name] is currently in the action stage of change. As such, her goal is to continue with weight loss efforts. She has agreed to the [employer].   Exercise goals: All adults should avoid inactivity. Some physical activity is better than none, and adults who participate in any amount of physical activity gain some health benefits.  Behavioral modification strategies: increasing lean protein intake, meal planning and cooking strategies, keeping healthy foods in the home, holiday eating strategies , and planning for success.  [name] [name] has agreed to follow-up with our clinic in 4 weeks. She was informed of  the importance of frequent follow-up visits to maximize her success with intensive lifestyle modifications for her multiple health conditions.   Objective:   Blood pressure (!) 143/80, pulse 93, temperature 98.7 F (37.1 C), height '5\' 6"'$  (1.676 m), weight 229 lb (103.9 kg), last menstrual period 09/13/2015, SpO2 100 %. Body mass index is 36.96 kg/m.  General: Cooperative, alert, well developed, in no acute distress. HEENT: Conjunctivae and lids unremarkable. Cardiovascular: Regular rhythm.  Lungs: Normal work of breathing. Neurologic: No focal deficits.   Lab Results  Component Value Date   CREATININE 0.78 04/27/2022   BUN 9 04/27/2022   NA 142 04/27/2022   K 3.8 04/27/2022   CL 105 04/27/2022   CO2 21 04/27/2022   Lab Results  Component Value Date   ALT 18 04/27/2022   AST 23 04/27/2022   ALKPHOS 76 04/27/2022   BILITOT 0.4 04/27/2022   Lab Results  Component Value Date   HGBA1C 5.5 04/27/2022   HGBA1C 5.4 12/01/2021   HGBA1C 5.4 04/28/2021   HGBA1C 5.4 08/26/2020   Lab Results  Component Value Date   INSULIN 13.4 04/27/2022   INSULIN 18.1 12/01/2021   INSULIN 21.0 04/28/2021   INSULIN 20.0 08/26/2020   Lab Results  Component Value Date   TSH 1.440 08/26/2020   Lab Results  Component Value Date   CHOL 193 04/27/2022   HDL 44 04/27/2022   LDLCALC 129 (H) 04/27/2022   TRIG 113 04/27/2022   CHOLHDL 3.2 04/28/2021   Lab Results  Component Value Date   VD25OH 45.4 04/27/2022   VD25OH 58.3 12/01/2021   VD25OH 70.4 04/28/2021   Lab  Results  Component Value Date   WBC 4.6 04/28/2021   HGB 12.9 04/28/2021   HCT 40.1 04/28/2021   MCV 94 04/28/2021   PLT 129 (L) 04/28/2021   Lab Results  Component Value Date   IRON 76 04/27/2009   FERRITIN 30.7 04/27/2009   Attestation Statements:   Reviewed by clinician on day of visit: allergies, medications, problem list, medical history, surgical history, family history, social history, and previous encounter  notes.  I, Elnora [facility], RMA am acting as transcriptionist for Coralie Common, MD.  I have reviewed the above documentation for accuracy and completeness, and I agree with the above. - Coralie Common, MD

## 2022-08-16 ENCOUNTER — Ambulatory Visit
Admission: RE | Admit: 2022-08-16 | Discharge: 2022-08-16 | Disposition: A | Discharge status: Home / Self Care | Payer: BC Managed Care – PPO | Source: Ambulatory Visit | Attending: Orthopaedic Surgery | Admitting: Orthopaedic Surgery

## 2022-08-16 DIAGNOSIS — M25512 Pain in left shoulder: Secondary | ICD-10-CM

## 2022-08-16 DIAGNOSIS — M75100 Unspecified rotator cuff tear or rupture of unspecified shoulder, not specified as traumatic: Secondary | ICD-10-CM

## 2022-08-28 ENCOUNTER — Other Ambulatory Visit: Payer: Self-pay | Admitting: Hematology and Oncology

## 2022-08-28 DIAGNOSIS — Z1231 Encounter for screening mammogram for malignant neoplasm of breast: Secondary | ICD-10-CM

## 2022-08-29 ENCOUNTER — Encounter: Payer: Self-pay | Admitting: Nurse Practitioner

## 2022-08-29 ENCOUNTER — Ambulatory Visit: Payer: BC Managed Care – PPO | Admitting: Nurse Practitioner

## 2022-08-29 VITALS — BP 122/82 | HR 96

## 2022-08-29 DIAGNOSIS — R1030 Lower abdominal pain, unspecified: Secondary | ICD-10-CM | POA: Diagnosis not present

## 2022-08-29 DIAGNOSIS — R590 Localized enlarged lymph nodes: Secondary | ICD-10-CM | POA: Diagnosis not present

## 2022-08-29 DIAGNOSIS — M79621 Pain in right upper arm: Secondary | ICD-10-CM | POA: Diagnosis not present

## 2022-08-29 NOTE — Progress Notes (Signed)
   Acute Office Visit  Subjective:    Patient ID: [name] [name], female    DOB: 1965-11-17, 57 y.o.   MRN: [id number]   HPI 57 y.o. G3P2003 presents today for intermittent pelvic pain x 2.5 weeks. Pain is located in lower mid abdomen, occurs about every other day and lasts a few minutes. Notices it most when lying down. Denies bleeding, discharge, irritation/itching, or odor. Denies urinary symptoms. Normal bowel movements, most recent yesterday. Normal pap 05/31/2022. She is very worried about uterine cancer with Tamoxifen use.   Also reports soreness in right outer breast x 1 week where previous cancer was found in 2019. Pain is just above lumpectomy scar. Breast cancer managed with lumpectomy, radiation, chemo, started Tamoxifen ~6 months ago. Had 3 lymph nodes removed. Normal diagnostic mammogram in July after noticing change in contour of breast. She has been dealing with sinus infections.    Review of Systems  Constitutional: Negative.   Gastrointestinal:  Negative for abdominal pain, constipation, diarrhea, nausea and vomiting.  Genitourinary:  Positive for pelvic pain. Negative for difficulty urinating, dysuria, flank pain, frequency, hematuria, urgency, vaginal bleeding, vaginal discharge and vaginal pain.  Hematological:  Negative for adenopathy.  Right breast: Positive for pain. Negative for nipple discharge, swelling, redness, skin changes.     Objective:    Physical Exam Constitutional:      Appearance: Normal appearance.  Chest:  Breasts:    Right: Mass and tenderness present. No swelling, bleeding, inverted nipple, nipple discharge or skin change.    Abdominal:     Tenderness: There is abdominal tenderness in the suprapubic area. There is no guarding or rebound.  Genitourinary:    General: Normal vulva.     Vagina: Normal.     Cervix: Normal.     Uterus: Tender.      Adnexa: Right adnexa normal and left adnexa normal.  Lymphadenopathy:     Upper Body:      Right upper body: Axillary adenopathy present. No supraclavicular adenopathy.     BP 122/82   Pulse 96   LMP 09/13/2015 Comment: celebate, BTL  SpO2 96%  Wt Readings from Last 3 Encounters:  07/27/22 229 lb (103.9 kg)  06/29/22 229 lb (103.9 kg)  05/31/22 229 lb (103.9 kg)        Patient informed chaperone available to be present for breast and/or pelvic exam. Patient has requested no chaperone to be present. Patient has been advised what will be completed during breast and pelvic exam.   Assessment & Plan:   Problem List Items Addressed This Visit   None Visit Diagnoses     Lower abdominal pain    -  Primary   Relevant Orders   US PELVIS TRANSVAGINAL NON-OB (TV ONLY)   Axillary pain, right       Axillary lymphadenopathy          Plan: Will schedule pelvic ultrasound for evaluation of lower abdominal pain. Reassurance provided that typically vaginal bleeding is present with uterine cancer. Axillary pain appears to be an enlarged lymph node, likely reactive due to recent sinus infection. If pain and swelling still present in 1 week we will refer for imaging.      Tamela Gammon DNP, 2:20 PM 08/29/2022

## 2022-08-31 ENCOUNTER — Encounter (INDEPENDENT_AMBULATORY_CARE_PROVIDER_SITE_OTHER): Payer: Self-pay | Admitting: Family Medicine

## 2022-08-31 ENCOUNTER — Other Ambulatory Visit: Payer: Self-pay | Admitting: Internal Medicine

## 2022-08-31 ENCOUNTER — Ambulatory Visit (INDEPENDENT_AMBULATORY_CARE_PROVIDER_SITE_OTHER): Payer: BC Managed Care – PPO | Admitting: Family Medicine

## 2022-08-31 VITALS — BP 156/92 | HR 84 | Temp 98.4°F | Ht 66.0 in | Wt 230.0 lb

## 2022-08-31 DIAGNOSIS — E669 Obesity, unspecified: Secondary | ICD-10-CM

## 2022-08-31 DIAGNOSIS — Z6837 Body mass index (BMI) 37.0-37.9, adult: Secondary | ICD-10-CM

## 2022-08-31 DIAGNOSIS — M79621 Pain in right upper arm: Secondary | ICD-10-CM

## 2022-08-31 DIAGNOSIS — I10 Essential (primary) hypertension: Secondary | ICD-10-CM

## 2022-08-31 DIAGNOSIS — F32A Depression, unspecified: Secondary | ICD-10-CM

## 2022-08-31 DIAGNOSIS — F419 Anxiety disorder, unspecified: Secondary | ICD-10-CM

## 2022-08-31 MED ORDER — SERTRALINE HCL 50 MG PO TABS: 50.0000 mg | ORAL_TABLET | Freq: Every day | ORAL | 1 refills | Status: AC

## 2022-09-11 NOTE — Progress Notes (Signed)
Chief Complaint:   OBESITY [name] [name] is here to discuss her progress with her obesity treatment plan along with follow-up of her obesity related diagnoses. [name] [name] is on the [employer] and states she is following her eating plan approximately 70% of the time. [name] [name] states she is exercising 0 minutes 0 times per week.  Today's visit was #: 88 Starting weight: 237 lbs Starting date: 08/26/2020 Today's weight: 230 lbs Today's date: 08/31/2022 Total lbs lost to date: 7 lbs Total lbs lost since last in-office visit: 0  Interim History: [name] [name] had a local holiday and family came home.  She is still coping with grief.  Week of Christmas she knows she has not followed plan as strictly.  Feels stuck on the weight of 220 and 230.  Subjective:   1. Essential hypertension Blood pressure is slightly elevated today.  On Cozaar and Norvasc.  2. Anxiety and depression [name] [name] is working with therapist and thru EMDR.  Denies suicidal ideas, and homicidal ideas.  Assessment/Plan:   1. Essential hypertension Follow up with blood pressure at next appointment.  2. Anxiety and depression We will refill Zoloft 50 mg daily for 3 months with 0 refills.  -Refill sertraline (ZOLOFT) 50 MG tablet; Take 1 tablet (50 mg total) by mouth daily.  Dispense: 90 tablet; Refill: 1  3. Obesity with current BMI of 37.1 [name] [name] is currently in the action stage of change. As such, her goal is to continue with weight loss efforts. She has agreed to the [employer].   Exercise goals: All adults should avoid inactivity. Some physical activity is better than none, and adults who participate in any amount of physical activity gain some health benefits.  Behavioral modification strategies: increasing lean protein intake, meal planning and cooking strategies, keeping healthy foods in the home, and planning for success.  [name] [name] has agreed to follow-up with our clinic in 3 weeks. She was  informed of the importance of frequent follow-up visits to maximize her success with intensive lifestyle modifications for her multiple health conditions.   Objective:   Blood pressure (!) 156/92, pulse 84, temperature 98.4 F (36.9 C), height '5\' 6"'$  (1.676 m), weight 230 lb (104.3 kg), last menstrual period 09/13/2015, SpO2 99 %. Body mass index is 37.12 kg/m.  General: Cooperative, alert, well developed, in no acute distress. HEENT: Conjunctivae and lids unremarkable. Cardiovascular: Regular rhythm.  Lungs: Normal work of breathing. Neurologic: No focal deficits.   Lab Results  Component Value Date   CREATININE 0.78 04/27/2022   BUN 9 04/27/2022   NA 142 04/27/2022   K 3.8 04/27/2022   CL 105 04/27/2022   CO2 21 04/27/2022   Lab Results  Component Value Date   ALT 18 04/27/2022   AST 23 04/27/2022   ALKPHOS 76 04/27/2022   BILITOT 0.4 04/27/2022   Lab Results  Component Value Date   HGBA1C 5.5 04/27/2022   HGBA1C 5.4 12/01/2021   HGBA1C 5.4 04/28/2021   HGBA1C 5.4 08/26/2020   Lab Results  Component Value Date   INSULIN 13.4 04/27/2022   INSULIN 18.1 12/01/2021   INSULIN 21.0 04/28/2021   INSULIN 20.0 08/26/2020   Lab Results  Component Value Date   TSH 1.440 08/26/2020   Lab Results  Component Value Date   CHOL 193 04/27/2022   HDL 44 04/27/2022   LDLCALC 129 (H) 04/27/2022   TRIG 113 04/27/2022   CHOLHDL 3.2 04/28/2021   Lab Results  Component Value Date   VD25OH 45.4  04/27/2022   VD25OH 58.3 12/01/2021   VD25OH 70.4 04/28/2021   Lab Results  Component Value Date   WBC 4.6 04/28/2021   HGB 12.9 04/28/2021   HCT 40.1 04/28/2021   MCV 94 04/28/2021   PLT 129 (L) 04/28/2021   Lab Results  Component Value Date   IRON 76 04/27/2009   FERRITIN 30.7 04/27/2009   Attestation Statements:   Reviewed by clinician on day of visit: allergies, medications, problem list, medical history, surgical history, family history, social history, and previous  encounter notes.  I, Elnora [facility], RMA am acting as transcriptionist for Coralie Common, MD.  I have reviewed the above documentation for accuracy and completeness, and I agree with the above. - Coralie Common, MD

## 2022-09-21 ENCOUNTER — Ambulatory Visit (INDEPENDENT_AMBULATORY_CARE_PROVIDER_SITE_OTHER): Payer: BC Managed Care – PPO | Admitting: Nurse Practitioner

## 2022-09-21 ENCOUNTER — Encounter: Payer: Self-pay | Admitting: Nurse Practitioner

## 2022-09-21 ENCOUNTER — Ambulatory Visit (INDEPENDENT_AMBULATORY_CARE_PROVIDER_SITE_OTHER): Payer: BC Managed Care – PPO

## 2022-09-21 VITALS — BP 110/80 | Resp 16

## 2022-09-21 DIAGNOSIS — R1030 Lower abdominal pain, unspecified: Secondary | ICD-10-CM | POA: Diagnosis not present

## 2022-09-21 NOTE — Progress Notes (Signed)
   Acute Office Visit  Subjective:    Patient ID: [name] [name], female    DOB: 1966-07-09, 57 y.o.   MRN: [id number]   HPI 57 y.o. G3P2003 presents today for ultrasound. Seen 08/29/22 with complaints of intermittent pelvic pain x 2.5 weeks, so over a month now. Pain is located in lower mid abdomen, occurs about every other day and lasts a few minutes. Notices it most when lying down. Denies bleeding, discharge, irritation/itching, or odor. Denies urinary symptoms. Normal bowel movements, most recent yesterday. Normal pap 05/31/2022. She is very worried about uterine cancer with Tamoxifen use.    Review of Systems  Constitutional: Negative.   Gastrointestinal:  Positive for abdominal pain. Negative for constipation, diarrhea, nausea and vomiting.  Genitourinary:  Negative for vaginal bleeding.       Objective:    Physical Exam Constitutional:      Appearance: Normal appearance.   GU: Not indicated  BP 110/80   Resp 16   LMP 09/13/2015 Comment: celebate, BTL  SpO2 99%  Wt Readings from Last 3 Encounters:  08/31/22 230 lb (104.3 kg)  07/27/22 229 lb (103.9 kg)  06/29/22 229 lb (103.9 kg)        Patient informed chaperone available to be present for breast and/or pelvic exam. Patient has requested no chaperone to be present. Patient has been advised what will be completed during breast and pelvic exam.   Assessment & Plan:   Problem List Items Addressed This Visit   None Visit Diagnoses     Lower abdominal pain    -  Primary      Vaginal ultrasound: Anteverted slightly enlarged uterus, several subserosal and intramural fibroids, largest measuring 2.49 cm.  Thin, symmetrical endometrium - 1.5 mm.  Trace fluid within the cavity, no masses seen.  Both ovaries small with atrophic appearance.  No adnexal masses or free fluid seen abdominally or vaginally.  Plan: Ultrasound thoroughly reviewed with patient. Reassurance provided on small fibroids not being cause for  pain and likely have decreased in size since menopause. Normal endometrial thickness. Recommend follow up with PCP or GI for abdominal pain.      [place], 4:12 PM 09/21/2022

## 2022-09-28 ENCOUNTER — Ambulatory Visit (INDEPENDENT_AMBULATORY_CARE_PROVIDER_SITE_OTHER): Payer: BC Managed Care – PPO | Admitting: Family Medicine

## 2022-10-02 ENCOUNTER — Ambulatory Visit
Admission: RE | Admit: 2022-10-02 | Discharge: 2022-10-02 | Disposition: A | Discharge status: Home / Self Care | Payer: BC Managed Care – PPO | Source: Ambulatory Visit | Attending: Internal Medicine | Admitting: Internal Medicine

## 2022-10-02 DIAGNOSIS — M79621 Pain in right upper arm: Secondary | ICD-10-CM

## 2022-10-04 ENCOUNTER — Ambulatory Visit (INDEPENDENT_AMBULATORY_CARE_PROVIDER_SITE_OTHER): Payer: BC Managed Care – PPO | Admitting: Family Medicine

## 2022-10-16 ENCOUNTER — Ambulatory Visit: Payer: BC Managed Care – PPO

## 2022-10-16 NOTE — Progress Notes (Signed)
Patient Care Team: [name], [name], MD as PCP - General (Family Medicine) Erroll Luna, MD as Consulting Physician (General Surgery) Nicholas Lose, MD as Consulting Physician (Hematology and Oncology) Kyung Rudd, MD as Consulting Physician (Radiation Oncology) Salvadore Dom, MD as Consulting Physician (Obstetrics and Gynecology)  DIAGNOSIS: No diagnosis found.  SUMMARY OF ONCOLOGIC HISTORY: Oncology History  Malignant neoplasm of lower-inner quadrant of right breast of female, estrogen receptor positive ([place])  07/16/2018 Initial Diagnosis   Pain right breast UOQ: Negative on mammogram, incidentally found right breast mass LIQ at 3:30 position measuring 0.6 cm biopsy revealed grade 3 IDC ER 40%, PR 0%, HER-2 negative, Ki-67 50%, T1bN0 stage Ib   07/24/2018 Cancer Staging   Staging form: Breast, AJCC 8th Edition - Clinical: Stage IB (cT1b, cN0, cM0, G3, ER+, PR-, HER2-) - Signed by Nicholas Lose, MD on 07/24/2018   08/23/2018 Surgery   Right lumpectomy: IDC, 0.7 cm, grade 3, margins negative, 0/3 lymph nodes negative, ER 40%, PR 0%, HER-2 negative, Ki-67 50%, T1BN0 stage Ia   08/23/2018 Oncotype testing   45/33%   09/04/2018 Cancer Staging   Staging form: Breast, AJCC 8th Edition - Pathologic: Stage IA (pT1b, pN0, cM0, G3, ER+, PR-, HER2-) - Signed by Gardenia Phlegm, NP on 09/04/2018   09/25/2018 - 01/22/2019 Chemotherapy   Adjuvant chemotherapy: completed 4 cycles dose dense Adriamycin and Cytoxan, completed 10 cycles of Taxol, stopped early due to neuropathy    02/19/2019 - 04/07/2019 Radiation Therapy   Adjuvant XRT The patient initially received a dose of 50.4 Gy in 28 fractions to the breast using whole-breast tangent fields. This was delivered using a 3-D conformal technique. The patient then received a boost to the seroma. This delivered an additional 10 Gy in 5 fractions using a 3-field photon boost technique. The total dose was 60.4 Gy.   04/2019 -  Anti-estrogen  oral therapy   Anastrozole switched to letrozole switched to Exemestane      CHIEF COMPLIANT: Follow-up of breast cancer on exemestane   INTERVAL HISTORY: [name] [name] is a  57 y.o. with above-mentioned history of breast cancer who underwent a right lumpectomy, adjuvant chemotherapy, radiation, and is currently on antiestrogen therapy with exemestane. Mammogram on 09/29/2021 showed of evidence of malignancy bilaterally. She presents to the clinic today for follow-up.     ALLERGIES:  is allergic to ibuprofen, lisinopril, dilaudid [hydromorphone], dilaudid [hydromorphone hcl], and tape.  MEDICATIONS:  Current Outpatient Medications  Medication Sig Dispense Refill   AMBULATORY NON FORMULARY MEDICATION Medication Name: Diltiazem 2%/Lidocaine 2%   Using your index finger apply a small amount of medication inside the anal opening and to the external anal area three times daily x 6-8 weeks. 30 g 1   amLODipine (NORVASC) 5 MG tablet Take 5 mg by mouth daily.     azelastine (OPTIVAR) 0.05 % ophthalmic solution Place 1 drop into both eyes 2 (two) times daily.     cetirizine (ZYRTEC) 10 MG tablet Take 10 mg by mouth daily.     Cholecalciferol (VITAMIN D) 50 MCG (2000 UT) CAPS Take 1 capsule (2,000 Units total) by mouth daily. 30 capsule 0   clobetasol ointment (TEMOVATE) [id number] % Apply 1 application  topically 2 (two) times daily. Then as needed for 30 days  1   clotrimazole-betamethasone (LOTRISONE) cream Apply 1 application topically 2 (two) times daily. For 5-7 days 30 g 0   hydrOXYzine (ATARAX) 25 MG tablet Take 25 mg by mouth 3 (three) times  daily as needed.     losartan (COZAAR) 50 MG tablet Take 50 mg by mouth daily.     Menaquinone-7 (VITAMIN K2 PO) Take by mouth.     montelukast (SINGULAIR) 10 MG tablet Take 10 mg by mouth at bedtime.     sertraline (ZOLOFT) 50 MG tablet Take 1 tablet (50 mg total) by mouth daily. 90 tablet 1   tamoxifen (NOLVADEX) 10 MG tablet Take 1 tablet (10  mg total) by mouth 2 (two) times daily. (Patient taking differently: Take 10 mg by mouth daily.) 90 tablet 3   No current facility-administered medications for this visit.    PHYSICAL EXAMINATION: ECOG PERFORMANCE STATUS: {CHL ONC ECOG PS:[phone]}  There were no vitals filed for this visit. There were no vitals filed for this visit.  BREAST:*** No palpable masses or nodules in either right or left breasts. No palpable axillary supraclavicular or infraclavicular adenopathy no breast tenderness or nipple discharge. (exam performed in the presence of a chaperone)  LABORATORY DATA:  I have reviewed the data as listed    Latest Ref Rng & Units 04/27/2022   12:31 PM 03/10/2022   10:27 AM 12/01/2021    8:14 AM  CMP  Glucose 70 - 99 mg/dL 75  99  95   BUN 6 - 24 mg/dL '9  13  15   '$ Creatinine 0.57 - 1.00 mg/dL 0.78  0.86  0.81   Sodium 134 - 144 mmol/L 142  139  138   Potassium 3.5 - 5.2 mmol/L 3.8  3.8  4.0   Chloride 96 - 106 mmol/L 105  105  104   CO2 20 - 29 mmol/L '21  29  23   '$ Calcium 8.7 - 10.2 mg/dL 8.9  9.4  9.3   Total Protein 6.0 - 8.5 g/dL 6.8  7.2  6.9   Total Bilirubin 0.0 - 1.2 mg/dL 0.4  0.5  0.4   Alkaline Phos 44 - 121 IU/L 76  63  106   AST 0 - 40 IU/L 23  22  63   ALT 0 - 32 IU/L 18  14  88     Lab Results  Component Value Date   WBC 4.6 04/28/2021   HGB 12.9 04/28/2021   HCT 40.1 04/28/2021   MCV 94 04/28/2021   PLT 129 (L) 04/28/2021   NEUTROABS 3.0 04/28/2021    ASSESSMENT & PLAN:  No problem-specific Assessment & Plan notes found for this encounter.    No orders of the defined types were placed in this encounter.  The patient has a good understanding of the overall plan. she agrees with it. she will call with any problems that may develop before the next visit here. Total time spent: 30 mins including face to face time and time spent for planning, charting and co-ordination of care   Suzzette Righter, [place] 10/16/22    I Gardiner Coins am  acting as a Education administrator for [employer]  ***

## 2022-10-18 ENCOUNTER — Other Ambulatory Visit: Payer: Self-pay

## 2022-10-18 ENCOUNTER — Inpatient Hospital Stay: Payer: BC Managed Care – PPO | Attending: Hematology and Oncology | Admitting: Hematology and Oncology

## 2022-10-18 VITALS — BP 134/80 | HR 93 | Temp 97.3°F | Resp 18 | Ht 66.0 in | Wt 239.7 lb

## 2022-10-18 DIAGNOSIS — Z923 Personal history of irradiation: Secondary | ICD-10-CM | POA: Diagnosis not present

## 2022-10-18 DIAGNOSIS — C50311 Malignant neoplasm of lower-inner quadrant of right female breast: Secondary | ICD-10-CM | POA: Insufficient documentation

## 2022-10-18 DIAGNOSIS — Z170 Estrogen receptor positive status [ER+]: Secondary | ICD-10-CM | POA: Insufficient documentation

## 2022-10-18 DIAGNOSIS — Z9221 Personal history of antineoplastic chemotherapy: Secondary | ICD-10-CM | POA: Diagnosis not present

## 2022-10-18 DIAGNOSIS — Z79810 Long term (current) use of selective estrogen receptor modulators (SERMs): Secondary | ICD-10-CM | POA: Diagnosis not present

## 2022-10-18 NOTE — Assessment & Plan Note (Addendum)
08/23/2018:Right lumpectomy: IDC, 0.7 cm, grade 3, margins negative, 0/3 lymph nodes negative, ER 40%, PR 0%, HER-2 negative, Ki-67 50%, T1BN0 stage Ia   Oncotype Dx 44: High Risk   Treatment plan: 1. Adj Chemo with Dose dense adriamycin and Cytoxan foll by Taxol weekly X 10 stopped early for toxicities 2. Adjuvant radiation therapy 02/20/2019 -03/28/2019  3. Adjuvant antiestrogen therapy ------------------------------------------------------------------------------------------------------------ Port will be removed 04/15/2019 Treatment plan: Adjuvant antiestrogen therapy with anastrozole 1 mg daily started September 2020, switched to letrozole, switching to exemestane started 08/21/2020 switched to tamoxifen 02/23/2022   Severe back pain issues: Improving after she stopped letrozole but still she experiences some of this even on exemestane.   Tamoxifen toxicities:      Breast cancer surveillance: 1.  Breast exam 10/18/2022: Benign 2. Mammogram 10/02/2022 benign breast density category C   RTC in 1 year

## 2022-11-06 ENCOUNTER — Ambulatory Visit (INDEPENDENT_AMBULATORY_CARE_PROVIDER_SITE_OTHER): Payer: BC Managed Care – PPO

## 2022-11-06 ENCOUNTER — Ambulatory Visit: Payer: BC Managed Care – PPO | Admitting: Podiatry

## 2022-11-06 DIAGNOSIS — M779 Enthesopathy, unspecified: Secondary | ICD-10-CM | POA: Diagnosis not present

## 2022-11-06 DIAGNOSIS — M7751 Other enthesopathy of right foot: Secondary | ICD-10-CM | POA: Diagnosis not present

## 2022-11-06 MED ORDER — DICLOFENAC SODIUM 75 MG PO TBEC: 75.0000 mg | DELAYED_RELEASE_TABLET | Freq: Two times a day (BID) | ORAL | 2 refills | Status: DC

## 2022-11-06 MED ORDER — TRIAMCINOLONE ACETONIDE 10 MG/ML IJ SUSP
10.0000 mg | Freq: Once | INTRAMUSCULAR | Status: AC
  Administered 2022-11-06: 10 mg

## 2022-11-06 NOTE — Progress Notes (Signed)
Subjective:   Patient ID: [name] [name], female   DOB: 57 y.o.   MRN: [id number]   HPI Patient presents stating she is developed a lot of discomfort around the big toe joint right with inflammation and pain around the joint present does not remember injury   ROS      Objective:  Physical Exam  Neurovascular status intact with inflammation pain around the first MPJ right fluid buildup around the joint surface with moderate bone prominence associated with condition     Assessment:  Inflammatory capsulitis of the first MPJ right with the possibility for structural changes     Plan:  H&P x-ray reviewed sterile prep and injected the first MPJ periarticular 3 mg dexamethasone Kenalog 5 mg Xylocaine advised on wider shoes reappoint to recheck in the next 3 weeks placed on anti-inflammatory  X-ray indicates that there is moderate elevation of the intermetatarsal angle and the possibility for moderate hallux limitus deformity

## 2023-01-03 ENCOUNTER — Other Ambulatory Visit: Payer: Self-pay | Admitting: Hematology and Oncology

## 2023-02-21 ENCOUNTER — Ambulatory Visit: Payer: BC Managed Care – PPO | Admitting: Podiatry

## 2023-02-21 ENCOUNTER — Encounter: Payer: Self-pay | Admitting: Podiatry

## 2023-02-21 DIAGNOSIS — M7671 Peroneal tendinitis, right leg: Secondary | ICD-10-CM | POA: Diagnosis not present

## 2023-02-21 DIAGNOSIS — M21619 Bunion of unspecified foot: Secondary | ICD-10-CM

## 2023-02-21 DIAGNOSIS — M21611 Bunion of right foot: Secondary | ICD-10-CM | POA: Diagnosis not present

## 2023-02-21 NOTE — Progress Notes (Signed)
Subjective:   Patient ID: [name] [name], female   DOB: 57 y.o.   MRN: [id number]   HPI Patient states she is getting a lot of pain on the outside of her right foot and her bunion has been sore right and she thinks that is making her walk differently and stated the injection only helped for a short period of time   ROS      Objective:  Physical Exam  Neurovascular status intact hyperostosis with structural deformity first metatarsal right with redness and inflammation fluid around the base of the fifth metatarsal right foot     Assessment:  HAV deformity right along with inflammatory peroneal tendinitis right with failure to respond to conservative care.  We have tried injections patient's tried wider shoes has tried padding the area and oral anti-inflammatories in the past     Plan:  H&P reviewed condition.  At this point I do think given the chronic nature of symptoms with compensation and the pain that structural bunion correction would be of benefit to her.  Patient wants this done and I also have not recommended at the same time injecting the tendon of the right foot to reduce the inflammation.  Patient wants surgery and at this point read consent form going over alternative treatments complications.  Scheduled for outpatient surgery and all questions answered today for patient.  Understands total recovery takes 4 to 6 months and I did dispense air fracture walker fitted well into her lower leg and I would like her to start wearing it now due to the peroneal tendinitis  X-rays that had been taken indicated elevation of the intermetatarsal angle 1-2 of approximate 15 degrees with bone spur formation

## 2023-02-26 ENCOUNTER — Other Ambulatory Visit: Payer: Self-pay | Admitting: Hematology and Oncology

## 2023-02-26 DIAGNOSIS — C50911 Malignant neoplasm of unspecified site of right female breast: Secondary | ICD-10-CM

## 2023-03-15 ENCOUNTER — Telehealth: Payer: Self-pay | Admitting: Urology

## 2023-03-15 NOTE — Telephone Encounter (Signed)
DOS - 04/10/23  [place] BUNIONECTOMY RIGHT --- [phone] INJECTION PERONEAL TENDON RIGHT --- [id number]  [employer]   SPOKE WITH CHELSEA W. WITH [employer] AND SHE STATED THAT FOR CPT CODES 60454 AND 20550 NO PRIOR AUTH IS REQUIRED.   CALL REF # [id number] W. 03/15/23 AT 3:25 PM EST

## 2023-04-08 ENCOUNTER — Ambulatory Visit (HOSPITAL_COMMUNITY)
Admission: EM | Admit: 2023-04-08 | Discharge: 2023-04-08 | Disposition: A | Discharge status: Home / Self Care | Payer: BC Managed Care – PPO | Attending: Internal Medicine | Admitting: Internal Medicine

## 2023-04-08 ENCOUNTER — Encounter (HOSPITAL_COMMUNITY): Payer: Self-pay

## 2023-04-08 DIAGNOSIS — U071 COVID-19: Secondary | ICD-10-CM | POA: Diagnosis not present

## 2023-04-08 DIAGNOSIS — J209 Acute bronchitis, unspecified: Secondary | ICD-10-CM | POA: Diagnosis not present

## 2023-04-08 MED ORDER — BENZONATATE 100 MG PO CAPS: 100.0000 mg | ORAL_CAPSULE | Freq: Three times a day (TID) | ORAL | 0 refills | Status: AC

## 2023-04-08 MED ORDER — AEROCHAMBER PLUS FLO-VU MEDIUM MISC
1.0000 | Freq: Once | Status: AC
  Administered 2023-04-08: 1

## 2023-04-08 MED ORDER — DEXAMETHASONE SODIUM PHOSPHATE 10 MG/ML IJ SOLN
10.0000 mg | Freq: Once | INTRAMUSCULAR | Status: AC
  Administered 2023-04-08: 10 mg via INTRAMUSCULAR

## 2023-04-08 MED ORDER — GUAIFENESIN ER 600 MG PO TB12: 1200.0000 mg | ORAL_TABLET | Freq: Two times a day (BID) | ORAL | 0 refills | Status: AC

## 2023-04-08 MED ORDER — AEROCHAMBER PLUS FLO-VU LARGE MISC
Status: AC
  Filled 2023-04-08: qty 1

## 2023-04-08 MED ORDER — ALBUTEROL SULFATE HFA 108 (90 BASE) MCG/ACT IN AERS
2.0000 | INHALATION_SPRAY | Freq: Once | RESPIRATORY_TRACT | Status: AC
  Administered 2023-04-08: 2 via RESPIRATORY_TRACT

## 2023-04-08 MED ORDER — ALBUTEROL SULFATE HFA 108 (90 BASE) MCG/ACT IN AERS
INHALATION_SPRAY | RESPIRATORY_TRACT | Status: AC
  Filled 2023-04-08: qty 6.7

## 2023-04-08 MED ORDER — DEXAMETHASONE SODIUM PHOSPHATE 10 MG/ML IJ SOLN
INTRAMUSCULAR | Status: AC
  Filled 2023-04-08: qty 1

## 2023-04-08 MED ORDER — PROMETHAZINE-DM 6.25-15 MG/5ML PO SYRP: 5.0000 mL | ORAL_SOLUTION | Freq: Every evening | ORAL | 0 refills | Status: DC | PRN

## 2023-04-08 NOTE — ED Provider Notes (Signed)
[facility]    CSN: [id number] Arrival date & time: 04/08/23  1300      History   Chief Complaint Chief Complaint  Patient presents with   Cough    HPI [name] [name] is a 57 y.o. female.   Patient presents to urgent care for evaluation of cough, nasal congestion, generalized fatigue, generalized bodyaches, and sore throat that started 5 days ago on Tuesday, April 03, 2023.  No recent known sick contacts with similar symptoms.  Cough is productive with yellow/green phlegm.  Reports intermittent bilateral chest discomfort and shortness of breath associated with cough.  No leg swelling, orthopnea, rash, dizziness, abdominal pain.  Unsure of max temp at home.  No history of chronic respiratory problems.  Never smoker, denies drug use.  Denies recent antibiotic or steroid use.  Taking over-the-counter medications for symptomatic relief without very much help.   Cough   Past Medical History:  Diagnosis Date   Allergy    seasonal   Anal fissure    Back pain    Breast cancer (HCC) 2019   R BREAST IDC   Complication of anesthesia    Constipation    Diverticulitis    Fatty liver    GERD (gastroesophageal reflux disease) 07/07/2005   Heartburn    Hemorrhoid    Hepatic steatosis    Hypertension    Internal hemorrhoids    Joint pain    Malignant neoplasm of lower-inner quadrant of right breast of female, estrogen receptor positive (HCC) 07/16/2018   Personal history of chemotherapy    Personal history of radiation therapy    PONV (postoperative nausea and vomiting)    Positive H. pylori test    SOBOE (shortness of breath on exertion)     Patient Active Problem List   Diagnosis Date Noted   Chronic allergic conjunctivitis 12/08/2021   Vasomotor rhinitis 12/08/2021   Insulin resistance 10/21/2020   Gastroesophageal reflux disease 10/21/2020   White coat syndrome with hypertension 10/21/2020   At risk for diabetes mellitus 10/21/2020   Constipation  09/30/2020   SOBOE (shortness of breath on exertion) 08/26/2020   Vitamin D deficiency 08/26/2020   Other fatigue 08/26/2020   At risk for impaired metabolic function 08/26/2020   Elevated coronary artery calcium score 07/14/2020   Mixed hyperlipidemia 07/14/2020   Atypical chest pain 07/08/2020   Chronic fatigue 07/07/2020   Left arm pain 12/05/2019   Leg edema 05/31/2019   Port-A-Cath in place 09/25/2018   Malignant neoplasm of lower-inner quadrant of right breast of female, estrogen receptor positive (HCC) 07/16/2018   Screening cholesterol level 07/10/2017   Anal fissure 07/07/2014   Pruritic rash 06/16/2014   Class 2 severe obesity with serious comorbidity and body mass index (BMI) of 38.0 to 38.9 in adult (HCC) 06/16/2014   Primary localized osteoarthrosis, lower leg 11/18/2013   Bilateral knee pain 09/08/2013   VITAMIN B12 DEFICIENCY 02/10/2010   Essential hypertension 08/16/2007   ALLERGIC RHINITIS 08/16/2007   GERD 08/16/2007   Right upper quadrant pain 05/13/2007    Past Surgical History:  Procedure Laterality Date   BREAST BIOPSY Right 07/12/2018   R BREAST IDC   BREAST LUMPECTOMY Right 08/22/2018   IDC   BREAST LUMPECTOMY WITH RADIOACTIVE SEED AND SENTINEL LYMPH NODE BIOPSY Right 08/23/2018   Procedure: RIGHT BREAST LUMPECTOMY WITH RADIOACTIVE SEED AND RIGHT SENTINEL LYMPH NODE MAPPING;  Surgeon: Harriette Bouillon, MD;  Location: [facility];  Service: General;  Laterality: Right;   COLON RESECTION  2000's Dr. Daphine Deutscher   Diverticulitis.   COLONOSCOPY     FOOT SURGERY Left    g3p2 c-section1     KNEE SURGERY Right    PORT-A-CATH REMOVAL Right 04/15/2019   Procedure: REMOVAL PORT-A-CATH;  Surgeon: Harriette Bouillon, MD;  Location: [facility];  Service: General;  Laterality: Right;  MAC AND LOCAL ANESTHETIC   PORTACATH PLACEMENT Right 09/24/2018   Procedure: INSERTION PORT-A-CATH WITH ULTRASOUND;  Surgeon: Harriette Bouillon, MD;  Location: [facility];  Service: General;  Laterality: Right;   SHOULDER SURGERY Left 05/18/2022   TONSILLECTOMY     TUBAL LIGATION      OB History     Gravida  3   Para  3   Term  2   Preterm      AB      Living  3      SAB      IAB      Ectopic      Multiple      Live Births  3            Home Medications    Prior to Admission medications   Medication Sig Start Date End Date Taking? Authorizing Provider  benzonatate (TESSALON) 100 MG capsule Take 1 capsule (100 mg total) by mouth every 8 (eight) hours. 04/08/23  Yes Carlisle Beers, FNP  guaiFENesin (MUCINEX) 600 MG 12 hr tablet Take 2 tablets (1,200 mg total) by mouth 2 (two) times daily. 04/08/23  Yes Carlisle Beers, FNP  promethazine-dextromethorphan (PROMETHAZINE-DM) 6.25-15 MG/5ML syrup Take 5 mLs by mouth at bedtime as needed for cough. 04/08/23  Yes Navil Kole, Donavan Burnet, FNP  AMBULATORY NON FORMULARY MEDICATION Medication Name: Diltiazem 2%/Lidocaine 2%   Using your index finger apply a small amount of medication inside the anal opening and to the external anal area three times daily x 6-8 weeks. 06/10/21   Unk Lightning, PA  amLODipine (NORVASC) 5 MG tablet Take 5 mg by mouth daily.    [provider]  azelastine (OPTIVAR) 0.05 % ophthalmic solution Place 1 drop into both eyes 2 (two) times daily.    [provider]  cetirizine (ZYRTEC) 10 MG tablet Take 10 mg by mouth daily.    [provider]  Cholecalciferol (VITAMIN D) 50 MCG (2000 UT) CAPS Take 1 capsule (2,000 Units total) by mouth daily. 05/25/22   Langston Reusing, MD  clobetasol ointment (TEMOVATE) 0.05 % Apply 1 application  topically 2 (two) times daily. Then as needed for 30 days 07/03/18   [provider]  clotrimazole-betamethasone (LOTRISONE) cream Apply 1 application topically 2 (two) times daily. For 5-7 days 03/22/21   Pyrtle, Carie Caddy, MD  diclofenac (VOLTAREN) 75 MG EC tablet Take 1 tablet (75  mg total) by mouth 2 (two) times daily. 11/06/22   Lenn Sink, DPM  hydrOXYzine (ATARAX) 25 MG tablet Take 25 mg by mouth 3 (three) times daily as needed.    [provider]  losartan (COZAAR) 50 MG tablet Take 50 mg by mouth daily.    [provider]  Menaquinone-7 (VITAMIN K2 PO) Take by mouth.    [provider]  montelukast (SINGULAIR) 10 MG tablet Take 10 mg by mouth at bedtime.    [provider]  sertraline (ZOLOFT) 50 MG tablet Take 1 tablet (50 mg total) by mouth daily. 08/31/22   Langston Reusing, MD  tamoxifen (NOLVADEX) 10 MG tablet TAKE 1 TABLET(10 MG)  BY MOUTH TWICE DAILY 02/26/23   Serena Croissant, MD    Family History Family History  Problem Relation Age of Onset   Hypertension Mother    Breast cancer Mother 41   Cancer Mother    Alcoholism Mother    Drug abuse Mother    Hypertension Father    Heart disease Father    Heart attack Father    Hypertension Brother     Social History Social History   Tobacco Use   Smoking status: Never   Smokeless tobacco: Never  Vaping Use   Vaping status: Never Used  Substance Use Topics   Alcohol use: No    Alcohol/week: 0.0 standard drinks of alcohol   Drug use: No     Allergies   Ibuprofen, Lisinopril, Dilaudid [hydromorphone], Dilaudid [hydromorphone hcl], and Tape   Review of Systems Review of Systems  Respiratory:  Positive for cough.   Per HPI   Physical Exam Triage Vital Signs ED Triage Vitals [04/08/23 1358]  Encounter Vitals Group     BP (!) 148/73     Systolic BP Percentile      Diastolic BP Percentile      Pulse Rate (!) 102     Resp 18     Temp 99.2 F (37.3 C)     Temp Source Oral     SpO2 98 %     Weight      Height      Head Circumference      Peak Flow      Pain Score 0     Pain Loc      Pain Education      Exclude from Growth Chart    No data found.  Updated Vital Signs BP (!) 148/73 (BP Location: Left Arm)   Pulse (!) 102   Temp 99.2 F  (37.3 C) (Oral)   Resp 18   LMP 09/13/2015 Comment: celebate, BTL  SpO2 98%   Visual Acuity Right Eye Distance:   Left Eye Distance:   Bilateral Distance:    Right Eye Near:   Left Eye Near:    Bilateral Near:     Physical Exam Vitals and nursing note reviewed.  Constitutional:      Appearance: She is ill-appearing. She is not toxic-appearing.  HENT:     Head: Normocephalic and atraumatic.     Right Ear: Hearing, tympanic membrane, ear canal and external ear normal.     Left Ear: Hearing, tympanic membrane, ear canal and external ear normal.     Nose: Congestion present.     Mouth/Throat:     Lips: Pink.     Mouth: Mucous membranes are moist. No injury.     Tongue: No lesions. Tongue does not deviate from midline.     Palate: No mass and lesions.     Pharynx: Oropharynx is clear. Uvula midline. Posterior oropharyngeal erythema present. No pharyngeal swelling, oropharyngeal exudate or uvula swelling.     Tonsils: No tonsillar exudate or tonsillar abscesses.  Eyes:     General: Lids are normal. Vision grossly intact. Gaze aligned appropriately.     Extraocular Movements: Extraocular movements intact.     Conjunctiva/sclera: Conjunctivae normal.  Cardiovascular:     Rate and Rhythm: Normal rate and regular rhythm.     Heart sounds: Normal heart sounds, S1 normal and S2 normal.  Pulmonary:     Effort: Pulmonary effort is normal. No respiratory distress.     Breath sounds: Normal breath sounds  and air entry. No wheezing, rhonchi or rales.     Comments: Coarse breath sounds throughout, speaking in full sentences without difficulty. Chest:     Chest wall: No tenderness.  Musculoskeletal:     Cervical back: Neck supple.     Right lower leg: No edema.     Left lower leg: No edema.  Lymphadenopathy:     Cervical: No cervical adenopathy.  Skin:    General: Skin is warm and dry.     Capillary Refill: Capillary refill takes less than 2 seconds.     Findings: No rash.   Neurological:     General: No focal deficit present.     Mental Status: She is alert and oriented to person, place, and time. Mental status is at baseline.     Cranial Nerves: No dysarthria or facial asymmetry.  Psychiatric:        Mood and Affect: Mood normal.        Speech: Speech normal.        Behavior: Behavior normal.        Thought Content: Thought content normal.        Judgment: Judgment normal.      UC Treatments / Results  Labs (all labs ordered are listed, but only abnormal results are displayed) Labs Reviewed  SARS CORONAVIRUS 2 (TAT 6-24 HRS)    EKG   Radiology No results found.  Procedures Procedures (including critical care time)  Medications Ordered in UC Medications  dexamethasone (DECADRON) injection 10 mg (10 mg Intramuscular Given 04/08/23 1514)  albuterol (VENTOLIN HFA) 108 (90 Base) MCG/ACT inhaler 2 puff (2 puffs Inhalation Given 04/08/23 1514)  AeroChamber Plus Flo-Vu Medium MISC 1 each (1 each Other Given 04/08/23 1514)    Initial Impression / Assessment and Plan / UC Course  I have reviewed the triage vital signs and the nursing notes.  Pertinent labs & imaging results that were available during my care of the patient were reviewed by me and considered in my medical decision making (see chart for details).   1.  Acute bronchitis Presentation is consistent with acute viral bronchitis.  Patient non-toxic in appearance, vital signs hemodynamically stable, no new oxygen requirement.  Strep/Viral testing: COVID testing pending, will call patient if this is positive.  Will treat with steroid, bronchodilator, cough suppressants for symptomatic relief, and expectorants (mucinex) as needed.  Dexamethasone 10 mg IM given in clinic. Deferred imaging based on stable cardiopulmonary exam and hemodynamically stable vital signs in clinic.  Counseled patient on potential for adverse effects with medications prescribed/recommended today, strict ER and  return-to-clinic precautions discussed, patient verbalized understanding.    Final Clinical Impressions(s) / UC Diagnoses   Final diagnoses:  Acute bronchitis, unspecified organism     Discharge Instructions      You have bronchitis which is inflammation of the upper airways in your lungs due to a virus. The following medicines will help with your symptoms.  COVID test is pending, staff will call you if this is positive.   -  Dexamethesone steroid shot given to help with cough and inflammation to the chest. - You may use albuterol inhaler 1 to 2 puffs every 4-6 hours as needed for cough, shortness of breath, and wheezing. - Take cough medicines as prescribed.  - Continue using over the counter medicines as needed as directed. Plain mucinex (guaifenesin) over the counter may further help breakup mucus and help with symptoms.   If you develop any new or worsening symptoms  or do not improve in the next 2 to 3 days, please return.  If your symptoms are severe, please go to the emergency room. Follow-up with PCP as needed.     ED Prescriptions     Medication Sig Dispense Auth. Provider   guaiFENesin (MUCINEX) 600 MG 12 hr tablet Take 2 tablets (1,200 mg total) by mouth 2 (two) times daily. 30 tablet Reita May M, FNP   benzonatate (TESSALON) 100 MG capsule Take 1 capsule (100 mg total) by mouth every 8 (eight) hours. 21 capsule Carlisle Beers, FNP   promethazine-dextromethorphan (PROMETHAZINE-DM) 6.25-15 MG/5ML syrup Take 5 mLs by mouth at bedtime as needed for cough. 118 mL Carlisle Beers, FNP      PDMP not reviewed this encounter.   Carlisle Beers, [place] 04/08/23 1523

## 2023-04-08 NOTE — Discharge Instructions (Addendum)
You have bronchitis which is inflammation of the upper airways in your lungs due to a virus. The following medicines will help with your symptoms.  COVID test is pending, staff will call you if this is positive.   -  Dexamethesone steroid shot given to help with cough and inflammation to the chest. - You may use albuterol inhaler 1 to 2 puffs every 4-6 hours as needed for cough, shortness of breath, and wheezing. - Take cough medicines as prescribed.  - Continue using over the counter medicines as needed as directed. Plain mucinex (guaifenesin) over the counter may further help breakup mucus and help with symptoms.   If you develop any new or worsening symptoms or do not improve in the next 2 to 3 days, please return.  If your symptoms are severe, please go to the emergency room. Follow-up with PCP as needed.

## 2023-04-08 NOTE — ED Triage Notes (Signed)
Pt c/o productive cough with brown/green sputum x5 days and today it was bloody. Pt c/o body aches, sore throat, headaches, and runny nose x5 days. Taking OTC with no relief.

## 2023-04-09 LAB — SARS CORONAVIRUS 2 (TAT 6-24 HRS): SARS Coronavirus 2: POSITIVE — AB

## 2023-04-16 ENCOUNTER — Encounter: Payer: BC Managed Care – PPO | Admitting: Podiatry

## 2023-04-30 ENCOUNTER — Encounter: Payer: BC Managed Care – PPO | Admitting: Podiatry

## 2023-05-07 ENCOUNTER — Encounter: Payer: BC Managed Care – PPO | Admitting: Podiatry

## 2023-05-14 ENCOUNTER — Encounter: Payer: BC Managed Care – PPO | Admitting: Podiatry

## 2023-05-21 ENCOUNTER — Encounter: Payer: BC Managed Care – PPO | Admitting: Podiatry

## 2023-05-28 MED ORDER — ONDANSETRON HCL 4 MG PO TABS: 4.0000 mg | ORAL_TABLET | Freq: Three times a day (TID) | ORAL | 0 refills | Status: AC | PRN

## 2023-05-28 MED ORDER — OXYCODONE-ACETAMINOPHEN 10-325 MG PO TABS: 1.0000 | ORAL_TABLET | ORAL | 0 refills | Status: DC | PRN

## 2023-05-28 NOTE — Addendum Note (Signed)
Addended by: Lenn Sink on: 05/28/2023 04:57 PM   Modules accepted: Orders

## 2023-05-29 DIAGNOSIS — M2011 Hallux valgus (acquired), right foot: Secondary | ICD-10-CM | POA: Diagnosis not present

## 2023-05-29 DIAGNOSIS — M65871 Other synovitis and tenosynovitis, right ankle and foot: Secondary | ICD-10-CM | POA: Diagnosis not present

## 2023-06-04 ENCOUNTER — Ambulatory Visit (INDEPENDENT_AMBULATORY_CARE_PROVIDER_SITE_OTHER): Payer: BC Managed Care – PPO

## 2023-06-04 ENCOUNTER — Ambulatory Visit (INDEPENDENT_AMBULATORY_CARE_PROVIDER_SITE_OTHER): Payer: BC Managed Care – PPO | Admitting: Podiatry

## 2023-06-04 ENCOUNTER — Encounter: Payer: Self-pay | Admitting: Podiatry

## 2023-06-04 DIAGNOSIS — M7671 Peroneal tendinitis, right leg: Secondary | ICD-10-CM

## 2023-06-04 DIAGNOSIS — M2011 Hallux valgus (acquired), right foot: Secondary | ICD-10-CM

## 2023-06-04 DIAGNOSIS — Z9889 Other specified postprocedural states: Secondary | ICD-10-CM

## 2023-06-06 ENCOUNTER — Ambulatory Visit: Payer: BC Managed Care – PPO | Admitting: Obstetrics and Gynecology

## 2023-06-06 NOTE — Progress Notes (Signed)
Subjective:   Patient ID: [name] [name], female   DOB: 57 y.o.   MRN: [id number]   HPI Patient states doing very well with surgery very pleased with how it is progressing able to walk on her foot   ROS      Objective:  Physical Exam  Neurovascular status intact negative Denna Haggard' sign noted wound edges are coapted well with good range of motion no crepitus of the joint with moderate edema consistent with this.  Postop     Assessment:  Doing well post osteotomy first metatarsal injection peroneal     Plan:  H&P reviewed and went ahead today and reviewed x-ray reapplied sterile dressing continue elevation compression immobilization and range of motion.  Reappoint to recheck in 3 weeks earlier if needed  X-rays indicate osteotomy is healing well fixation in place joint congruence

## 2023-06-18 ENCOUNTER — Encounter: Payer: Self-pay | Admitting: Podiatry

## 2023-06-18 ENCOUNTER — Ambulatory Visit (INDEPENDENT_AMBULATORY_CARE_PROVIDER_SITE_OTHER): Payer: BC Managed Care – PPO

## 2023-06-18 ENCOUNTER — Ambulatory Visit (INDEPENDENT_AMBULATORY_CARE_PROVIDER_SITE_OTHER): Payer: BC Managed Care – PPO | Admitting: Podiatry

## 2023-06-18 DIAGNOSIS — M778 Other enthesopathies, not elsewhere classified: Secondary | ICD-10-CM

## 2023-06-18 MED ORDER — PREDNISONE 10 MG PO TABS: ORAL_TABLET | ORAL | 0 refills | Status: DC

## 2023-06-18 NOTE — Progress Notes (Signed)
Subjective:   Patient ID: [name] [name], female   DOB: 57 y.o.   MRN: [id number]   HPI Patient presents stating she is doing pretty good but she has to be on her feet all the time at work and it gets sore neuro   ROS      Objective:  Physical Exam  Vascular status intact negative Homan sign was noted with good motion around the first MPJ only mild pain with deep pressure.  Patient overall seems to be doing well incision site is healed well     Assessment:  Patient is excessively active and is probably developing inflammatory complex from not being able to elevate or take it easy at this.  Postop     Plan:  H&P x-ray reviewed and I did go ahead and placed her on a 12-day Sterapred Dosepak to try to reduce the inflammatory cycle and gave her instructions on elevation compression immobilization reappoint 6 weeks and dispensed ankle compression stocking  X-rays indicate the osteotomy is healing well fixation in place joint congruence

## 2023-07-05 ENCOUNTER — Ambulatory Visit (INDEPENDENT_AMBULATORY_CARE_PROVIDER_SITE_OTHER): Payer: BC Managed Care – PPO | Admitting: Obstetrics and Gynecology

## 2023-07-05 ENCOUNTER — Encounter: Payer: Self-pay | Admitting: Obstetrics and Gynecology

## 2023-07-05 VITALS — BP 124/76 | HR 98 | Ht 66.0 in | Wt 239.0 lb

## 2023-07-05 DIAGNOSIS — Z01419 Encounter for gynecological examination (general) (routine) without abnormal findings: Secondary | ICD-10-CM | POA: Insufficient documentation

## 2023-07-05 DIAGNOSIS — Z6838 Body mass index (BMI) 38.0-38.9, adult: Secondary | ICD-10-CM

## 2023-07-05 DIAGNOSIS — E66812 Obesity, class 2: Secondary | ICD-10-CM

## 2023-07-05 DIAGNOSIS — C50311 Malignant neoplasm of lower-inner quadrant of right female breast: Secondary | ICD-10-CM

## 2023-07-05 DIAGNOSIS — Z170 Estrogen receptor positive status [ER+]: Secondary | ICD-10-CM | POA: Diagnosis not present

## 2023-07-05 NOTE — Assessment & Plan Note (Addendum)
Cervical cancer screening performed according to ASCCP guidelines. Encouraged annual mammogram screening Colonoscopy UTD DXA 2021 wnl Labs and immunizations with her primary Encouraged safe sexual practices as indicated Encouraged healthy lifestyle practices with diet and exercise For patients under 50-59yo, I recommend 1200mg  calcium daily and 600IU of vitamin D daily.

## 2023-07-05 NOTE — Progress Notes (Signed)
57 y.o. [id number] postmenopausal female with history of right breat cancer (dx 2019, s/p lumpectomy, chemo, RT, on tamoxifen, started ~2023) here for annual exam. Divorced, works in Retail banker.  Had recent right foot surgery. Reports ongoing intermittent RLQ pain (normal TVUS 2024). No pain currently. Constipation improved with diet   Postmenopausal bleeding: none Pelvic discharge or pain: none Breast mass, nipple discharge or skin changes : none Last PAP:     Component Value Date/Time   DIAGPAP  05/31/2022 0825    - Negative for intraepithelial lesion or malignancy (NILM)   DIAGPAP  04/16/2018 0000    NEGATIVE FOR INTRAEPITHELIAL LESIONS OR MALIGNANCY.   DIAGPAP  04/16/2018 0000    A LETTER WAS SENT TO THE PATIENT INFORMING HER OF THE ABOVE RESULTS.   HPVHIGH Negative 05/31/2022 0825   ADEQPAP  05/31/2022 0825    Satisfactory for evaluation; transformation zone component ABSENT.   ADEQPAP  04/16/2018 0000    Satisfactory for evaluation  endocervical/transformation zone component PRESENT.   Last mammogram: 10/02/22 BIRADS 2, density c Last colonoscopy: 09/07/17, q 10 years Last DXA: 10/15/19 wnl Sexually active: no  Exercising: no due to recent foot surgery  GYN HISTORY: history of right breat cancer (dx 2019, s/p lumpectomy, chemo, RT, on tamoxifene)  OB History  Gravida Para Term Preterm AB Living  3 3 2     3   SAB IAB Ectopic Multiple Live Births          3    # Outcome Date GA Lbr Len/2nd Weight Sex Type Anes PTL Lv  3 Para     M CS-Unspec     2 Term      Vag-Spont   LIV  1 Term      Vag-Spont   LIV    Past Medical History:  Diagnosis Date   Allergy    seasonal   Anal fissure    Back pain    Breast cancer (HCC) 2019   R BREAST IDC   Complication of anesthesia    Constipation    Diverticulitis    Fatty liver    GERD (gastroesophageal reflux disease) 07/07/2005   Heartburn    Hemorrhoid    Hepatic steatosis    Hypertension    Internal hemorrhoids     Joint pain    Malignant neoplasm of lower-inner quadrant of right breast of female, estrogen receptor positive (HCC) 07/16/2018   Personal history of chemotherapy    Personal history of radiation therapy    PONV (postoperative nausea and vomiting)    Positive H. pylori test    SOBOE (shortness of breath on exertion)     Past Surgical History:  Procedure Laterality Date   BREAST BIOPSY Right 07/12/2018   R BREAST IDC   BREAST LUMPECTOMY Right 08/22/2018   IDC   BREAST LUMPECTOMY WITH RADIOACTIVE SEED AND SENTINEL LYMPH NODE BIOPSY Right 08/23/2018   Procedure: RIGHT BREAST LUMPECTOMY WITH RADIOACTIVE SEED AND RIGHT SENTINEL LYMPH NODE MAPPING;  Surgeon: Harriette Bouillon, MD;  Location: [facility];  Service: General;  Laterality: Right;   COLON RESECTION  2000's Dr. Daphine Deutscher   Diverticulitis.   COLONOSCOPY     FOOT SURGERY Left    g3p2 c-section1     KNEE SURGERY Right    PORT-A-CATH REMOVAL Right 04/15/2019   Procedure: REMOVAL PORT-A-CATH;  Surgeon: Harriette Bouillon, MD;  Location: [facility];  Service: General;  Laterality: Right;  MAC AND LOCAL ANESTHETIC   PORTACATH PLACEMENT Right  09/24/2018   Procedure: INSERTION PORT-A-CATH WITH ULTRASOUND;  Surgeon: Harriette Bouillon, MD;  Location: [facility];  Service: General;  Laterality: Right;   SHOULDER SURGERY Left 05/18/2022   TONSILLECTOMY     TUBAL LIGATION      Current Outpatient Medications on File Prior to Visit  Medication Sig Dispense Refill   AMBULATORY NON FORMULARY MEDICATION Medication Name: Diltiazem 2%/Lidocaine 2%   Using your index finger apply a small amount of medication inside the anal opening and to the external anal area three times daily x 6-8 weeks. 30 g 1   amLODipine (NORVASC) 5 MG tablet Take 5 mg by mouth daily.     azelastine (OPTIVAR) 0.05 % ophthalmic solution Place 1 drop into both eyes 2 (two) times daily.     benzonatate (TESSALON) 100 MG capsule Take 1 capsule (100 mg total) by  mouth every 8 (eight) hours. 21 capsule 0   cetirizine (ZYRTEC) 10 MG tablet Take 10 mg by mouth daily.     Cholecalciferol (VITAMIN D) 50 MCG (2000 UT) CAPS Take 1 capsule (2,000 Units total) by mouth daily. 30 capsule 0   clobetasol ointment (TEMOVATE) 0.05 % Apply 1 application  topically 2 (two) times daily. Then as needed for 30 days  1   clotrimazole-betamethasone (LOTRISONE) cream Apply 1 application topically 2 (two) times daily. For 5-7 days 30 g 0   diclofenac (VOLTAREN) 75 MG EC tablet Take 1 tablet (75 mg total) by mouth 2 (two) times daily. 50 tablet 2   guaiFENesin (MUCINEX) 600 MG 12 hr tablet Take 2 tablets (1,200 mg total) by mouth 2 (two) times daily. 30 tablet 0   hydrOXYzine (ATARAX) 25 MG tablet Take 25 mg by mouth 3 (three) times daily as needed.     losartan (COZAAR) 50 MG tablet Take 50 mg by mouth daily.     Menaquinone-7 (VITAMIN K2 PO) Take by mouth.     montelukast (SINGULAIR) 10 MG tablet Take 10 mg by mouth at bedtime.     ondansetron (ZOFRAN) 4 MG tablet Take 1 tablet (4 mg total) by mouth every 8 (eight) hours as needed for nausea or vomiting. 20 tablet 0   predniSONE (DELTASONE) 10 MG tablet 12 day tapering dose 48 tablet 0   sertraline (ZOLOFT) 50 MG tablet Take 1 tablet (50 mg total) by mouth daily. 90 tablet 1   tamoxifen (NOLVADEX) 10 MG tablet TAKE 1 TABLET(10 MG) BY MOUTH TWICE DAILY 90 tablet 3   No current facility-administered medications on file prior to visit.    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 Married '2000 - 43yrs/seperated. 2 boys - '82, '87, 1 dtr - '81.   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    Social Determinants of Health  Financial Resource Strain: Not on file  Food Insecurity: Not on file  Transportation Needs: Not on file  Physical Activity: Not on file  Stress: Not on file  Social Connections: Unknown (01/03/2022)   Received from [facility], [facility]   Social Network    Social Network: Not on file  Intimate Partner Violence: Unknown (11/25/2021)   Received from [facility], [facility]   HITS    Physically Hurt: Not on file    Insult or Talk Down To: Not on file    Threaten Physical Harm: Not on file    Scream or Curse: Not on file    Family History  Problem Relation Age of Onset   Hypertension Mother    Breast cancer Mother 67   Cancer Mother    Alcoholism Mother    Drug abuse Mother    Hypertension Father    Heart disease Father    Heart attack Father    Hypertension Brother     Allergies  Allergen Reactions   Ibuprofen Swelling    SWELLING REACTION UNSPECIFIED    Lisinopril Swelling    angioedema   Dilaudid [Hydromorphone] Other (See Comments)   Dilaudid [Hydromorphone Hcl] Nausea Only   Tape Rash      PE Today's Vitals   07/05/23 0808  BP: 124/76  Pulse: 98  SpO2: 100%  Weight: 239 lb (108.4 kg)  Height: 5\' 6"  (1.676 m)   Body mass index is 38.58 kg/m.  Physical Exam Vitals reviewed. Exam conducted with a chaperone present.  Constitutional:      General: She is not in acute distress.    Appearance: Normal appearance.  HENT:     Head: Normocephalic and atraumatic.     Nose: Nose normal.  Eyes:     Extraocular Movements: Extraocular movements intact.     Conjunctiva/sclera: Conjunctivae normal.  Neck:     Thyroid: No thyroid mass, thyromegaly or  thyroid tenderness.  Pulmonary:     Effort: Pulmonary effort is normal.  Chest:     Chest wall: No mass or tenderness.  Breasts:    Right: Normal. No swelling, mass, nipple discharge, skin change or tenderness.     Left: Normal. No swelling, mass, nipple discharge, skin change or tenderness.    Abdominal:     General: There is no distension.     Palpations: Abdomen is soft.     Tenderness: There is no abdominal tenderness.  Genitourinary:    General: Normal vulva.     Exam position: Lithotomy position.     Urethra: No prolapse.     Vagina: Normal. No vaginal discharge or bleeding.     Cervix: Normal. No lesion.     Uterus: Normal. Not enlarged and not tender.      Adnexa: Right adnexa normal and left adnexa normal.  Musculoskeletal:        General: Normal range of motion.     Cervical back: Normal range of motion.  Lymphadenopathy:     Upper Body:     Right upper body: No axillary adenopathy.     Left upper body: No axillary adenopathy.     Lower Body: No right inguinal adenopathy. No left inguinal adenopathy.  Skin:    General: Skin is warm and dry.  Neurological:     General: No focal deficit present.     Mental Status: She is alert.  Psychiatric:        Mood and Affect: Mood normal.  Behavior: Behavior normal.       Assessment and Plan:        Well woman exam with routine gynecological exam Assessment & Plan: Cervical cancer screening performed according to ASCCP guidelines. Encouraged annual mammogram screening Colonoscopy UTD DXA 2021 wnl Labs and immunizations with her primary Encouraged safe sexual practices as indicated Encouraged healthy lifestyle practices with diet and exercise For patients under 50-70yo, I recommend 1200mg  calcium daily and 600IU of vitamin D daily.    Malignant neoplasm of lower-inner quadrant of right breast of female, estrogen receptor positive (HCC) Assessment & Plan: Normal breast exam today MMG UTD   Class 2 severe  obesity with serious comorbidity and body mass index (BMI) of 38.0 to 38.9 in adult, unspecified obesity type [facility]) Assessment & Plan: Encouraged healthy diet and exercise once recovered.      Rosalyn Gess, MD

## 2023-07-05 NOTE — Assessment & Plan Note (Signed)
Encouraged healthy diet and exercise once recovered.

## 2023-07-05 NOTE — Patient Instructions (Signed)
For patients under 50-59yo, I recommend 1200mg  calcium daily and 600IU of vitamin D daily. For patients over 59yo, I recommend 1200mg  calcium daily and 800IU of vitamin D daily.  Health Maintenance, Female Adopting a healthy lifestyle and getting preventive care are important in promoting health and wellness. Ask your health care provider about: The right schedule for you to have regular tests and exams. Things you can do on your own to prevent diseases and keep yourself healthy. What should I know about diet, weight, and exercise? Eat a healthy diet  Eat a diet that includes plenty of vegetables, fruits, low-fat dairy products, and lean protein. Do not eat a lot of foods that are high in solid fats, added sugars, or sodium. Maintain a healthy weight Body mass index (BMI) is used to identify weight problems. It estimates body fat based on height and weight. Your health care provider can help determine your BMI and help you achieve or maintain a healthy weight. Get regular exercise Get regular exercise. This is one of the most important things you can do for your health. Most adults should: Exercise for at least 150 minutes each week. The exercise should increase your heart rate and make you sweat (moderate-intensity exercise). Do strengthening exercises at least twice a week. This is in addition to the moderate-intensity exercise. Spend less time sitting. Even light physical activity can be beneficial. Watch cholesterol and blood lipids Have your blood tested for lipids and cholesterol at 59 years of age, then have this test every 5 years. Have your cholesterol levels checked more often if: Your lipid or cholesterol levels are high. You are older than 59 years of age. You are at high risk for heart disease. What should I know about cancer screening? Depending on your health history and family history, you may need to have cancer screening at various ages. This may include screening  for: Breast cancer. Cervical cancer. Colorectal cancer. Skin cancer. Lung cancer. What should I know about heart disease, diabetes, and high blood pressure? Blood pressure and heart disease High blood pressure causes heart disease and increases the risk of stroke. This is more likely to develop in people who have high blood pressure readings or are overweight. Have your blood pressure checked: Every 3-5 years if you are 83-59 years of age. Every year if you are 4 years old or older. Diabetes Have regular diabetes screenings. This checks your fasting blood sugar level. Have the screening done: Once every three years after age 68 if you are at a normal weight and have a low risk for diabetes. More often and at a younger age if you are overweight or have a high risk for diabetes. What should I know about preventing infection? Hepatitis B If you have a higher risk for hepatitis B, you should be screened for this virus. Talk with your health care provider to find out if you are at risk for hepatitis B infection. Hepatitis C Testing is recommended for: Everyone born from [birth year] through 1967. Anyone with known risk factors for hepatitis C. Sexually transmitted infections (STIs) Get screened for STIs, including gonorrhea and chlamydia, if: You are sexually active and are younger than 59 years of age. You are older than 59 years of age and your health care provider tells you that you are at risk for this type of infection. Your sexual activity has changed since you were last screened, and you are at increased risk for chlamydia or gonorrhea. Ask your health care provider if  you are at risk. Ask your health care provider about whether you are at high risk for HIV. Your health care provider may recommend a prescription medicine to help prevent HIV infection. If you choose to take medicine to prevent HIV, you should first get tested for HIV. You should then be tested every 3 months for as long as you  are taking the medicine. Osteoporosis and menopause Osteoporosis is a disease in which the bones lose minerals and strength with aging. This can result in bone fractures. If you are 44 years old or older, or if you are at risk for osteoporosis and fractures, ask your health care provider if you should: Be screened for bone loss. Take a calcium or vitamin D supplement to lower your risk of fractures. Be given hormone replacement therapy (HRT) to treat symptoms of menopause. Follow these instructions at home: Alcohol use Do not drink alcohol if: Your health care provider tells you not to drink. You are pregnant, may be pregnant, or are planning to become pregnant. If you drink alcohol: Limit how much you have to: 0-1 drink a day. Know how much alcohol is in your drink. In the [place] one drink equals one 12 oz bottle of beer (355 mL), one 5 oz glass of wine (148 mL), or one 1 oz glass of hard liquor (44 mL). Lifestyle Do not use any products that contain nicotine or tobacco. These products include cigarettes, chewing tobacco, and vaping devices, such as e-cigarettes. If you need help quitting, ask your health care provider. Do not use street drugs. Do not share needles. Ask your health care provider for help if you need support or information about quitting drugs. General instructions Schedule regular health, dental, and eye exams. Stay current with your vaccines. Tell your health care provider if: You often feel depressed. You have ever been abused or do not feel safe at home. Summary Adopting a healthy lifestyle and getting preventive care are important in promoting health and wellness. Follow your health care provider's instructions about healthy diet, exercising, and getting tested or screened for diseases. Follow your health care provider's instructions on monitoring your cholesterol and blood pressure. This information is not intended to replace advice given to you by your health  care provider. Make sure you discuss any questions you have with your health care provider. Document Revised: 12/27/2020 Document Reviewed: 12/27/2020 Elsevier Patient Education  2024 [employer].

## 2023-07-05 NOTE — Assessment & Plan Note (Signed)
Normal breast exam today MMG UTD

## 2023-07-23 ENCOUNTER — Encounter: Payer: Self-pay | Admitting: Podiatry

## 2023-07-23 ENCOUNTER — Ambulatory Visit (INDEPENDENT_AMBULATORY_CARE_PROVIDER_SITE_OTHER): Payer: BC Managed Care – PPO | Admitting: Podiatry

## 2023-07-23 ENCOUNTER — Ambulatory Visit (INDEPENDENT_AMBULATORY_CARE_PROVIDER_SITE_OTHER): Payer: BC Managed Care – PPO

## 2023-07-23 VITALS — Ht 66.0 in | Wt 240.0 lb

## 2023-07-23 DIAGNOSIS — M2011 Hallux valgus (acquired), right foot: Secondary | ICD-10-CM

## 2023-07-23 NOTE — Progress Notes (Signed)
Subjective:   Patient ID: [name] [name], female   DOB: 57 y.o.   MRN: [id number]   HPI Patient presents stating I am doing fine just wanted checked for final visit   ROS      Objective:  Physical Exam  Neurovascular status intact negative Denna Haggard' sign noted wound edges right first metatarsal healing well good motion no other pathology     Assessment:  Doing well post osteotomy first metatarsal right     Plan:  H&P reviewed and x-ray and I am discharging allowing to return to normal activity and explained range of motion exercises  X-rays indicate the osteotomy healing well fixation in place joint congruence

## 2023-08-29 ENCOUNTER — Other Ambulatory Visit: Payer: Self-pay | Admitting: Hematology and Oncology

## 2023-08-29 DIAGNOSIS — C50911 Malignant neoplasm of unspecified site of right female breast: Secondary | ICD-10-CM

## 2023-09-04 ENCOUNTER — Other Ambulatory Visit: Payer: Self-pay | Admitting: Hematology and Oncology

## 2023-09-04 DIAGNOSIS — Z1231 Encounter for screening mammogram for malignant neoplasm of breast: Secondary | ICD-10-CM

## 2023-09-06 ENCOUNTER — Encounter (HOSPITAL_COMMUNITY): Payer: Self-pay

## 2023-09-06 ENCOUNTER — Other Ambulatory Visit: Payer: Self-pay

## 2023-09-06 ENCOUNTER — Emergency Department (HOSPITAL_COMMUNITY)
Admission: EM | Admit: 2023-09-06 | Discharge: 2023-09-06 | Disposition: A | Discharge status: Home / Self Care | Payer: BC Managed Care – PPO | Attending: Emergency Medicine | Admitting: Emergency Medicine

## 2023-09-06 ENCOUNTER — Emergency Department (HOSPITAL_COMMUNITY): Payer: BC Managed Care – PPO

## 2023-09-06 DIAGNOSIS — R1011 Right upper quadrant pain: Secondary | ICD-10-CM | POA: Diagnosis present

## 2023-09-06 DIAGNOSIS — R0789 Other chest pain: Secondary | ICD-10-CM | POA: Diagnosis not present

## 2023-09-06 DIAGNOSIS — Z853 Personal history of malignant neoplasm of breast: Secondary | ICD-10-CM | POA: Insufficient documentation

## 2023-09-06 DIAGNOSIS — I10 Essential (primary) hypertension: Secondary | ICD-10-CM | POA: Insufficient documentation

## 2023-09-06 DIAGNOSIS — Z79899 Other long term (current) drug therapy: Secondary | ICD-10-CM | POA: Diagnosis not present

## 2023-09-06 LAB — CBC WITH DIFFERENTIAL/PLATELET
Abs Immature Granulocytes: 0.02 10*3/uL (ref 0.00–0.07)
Basophils Absolute: 0 10*3/uL (ref 0.0–0.1)
Basophils Relative: 0 %
Eosinophils Absolute: 0 10*3/uL (ref 0.0–0.5)
Eosinophils Relative: 0 %
HCT: 39.3 % (ref 36.0–46.0)
Hemoglobin: 12.6 g/dL (ref 12.0–15.0)
Immature Granulocytes: 0 %
Lymphocytes Relative: 24 %
Lymphs Abs: 1.4 10*3/uL (ref 0.7–4.0)
MCH: 30.7 pg (ref 26.0–34.0)
MCHC: 32.1 g/dL (ref 30.0–36.0)
MCV: 95.9 fL (ref 80.0–100.0)
Monocytes Absolute: 0.5 10*3/uL (ref 0.1–1.0)
Monocytes Relative: 9 %
Neutro Abs: 3.8 10*3/uL (ref 1.7–7.7)
Neutrophils Relative %: 67 %
Platelets: 134 10*3/uL — ABNORMAL LOW (ref 150–400)
RBC: 4.1 MIL/uL (ref 3.87–5.11)
RDW: 13.3 % (ref 11.5–15.5)
WBC: 5.7 10*3/uL (ref 4.0–10.5)
nRBC: 0 % (ref 0.0–0.2)

## 2023-09-06 LAB — COMPREHENSIVE METABOLIC PANEL
ALT: 24 U/L (ref 0–44)
AST: 31 U/L (ref 15–41)
Albumin: 4 g/dL (ref 3.5–5.0)
Alkaline Phosphatase: 58 U/L (ref 38–126)
Anion gap: 11 (ref 5–15)
BUN: 13 mg/dL (ref 6–20)
CO2: 24 mmol/L (ref 22–32)
Calcium: 9.3 mg/dL (ref 8.9–10.3)
Chloride: 103 mmol/L (ref 98–111)
Creatinine, Ser: 0.66 mg/dL (ref 0.44–1.00)
GFR, Estimated: >60 mL/min (ref >60)
Glucose, Bld: 100 mg/dL — ABNORMAL HIGH (ref 70–99)
Potassium: 4.1 mmol/L (ref 3.5–5.1)
Sodium: 138 mmol/L (ref 135–145)
Total Bilirubin: 0.6 mg/dL (ref 0.0–1.2)
Total Protein: 7.6 g/dL (ref 6.5–8.1)

## 2023-09-06 LAB — LIPASE, BLOOD: Lipase: 27 U/L (ref 11–51)

## 2023-09-06 LAB — URINALYSIS, ROUTINE W REFLEX MICROSCOPIC
Bilirubin Urine: NEGATIVE
Glucose, UA: NEGATIVE mg/dL
Hgb urine dipstick: NEGATIVE
Ketones, ur: NEGATIVE mg/dL
Leukocytes,Ua: NEGATIVE
Nitrite: NEGATIVE
Protein, ur: NEGATIVE mg/dL
Specific Gravity, Urine: 1.019 (ref 1.005–1.030)
pH: 5 (ref 5.0–8.0)

## 2023-09-06 LAB — TROPONIN I (HIGH SENSITIVITY)
Troponin I (High Sensitivity): 2 ng/L (ref <18)
Troponin I (High Sensitivity): 4 ng/L (ref <18)

## 2023-09-06 MED ORDER — MORPHINE SULFATE (PF) 4 MG/ML IV SOLN
4.0000 mg | Freq: Once | INTRAVENOUS | Status: AC
  Administered 2023-09-06: 4 mg via INTRAVENOUS
  Filled 2023-09-06: qty 1

## 2023-09-06 MED ORDER — ETODOLAC 400 MG PO TABS: 400.0000 mg | ORAL_TABLET | Freq: Two times a day (BID) | ORAL | 0 refills | Status: AC

## 2023-09-06 MED ORDER — IOHEXOL 300 MG/ML  SOLN
100.0000 mL | Freq: Once | INTRAMUSCULAR | Status: AC | PRN
  Administered 2023-09-06: 100 mL via INTRAVENOUS

## 2023-09-06 MED ORDER — ONDANSETRON HCL 4 MG/2ML IJ SOLN
4.0000 mg | Freq: Once | INTRAMUSCULAR | Status: AC
  Administered 2023-09-06: 4 mg via INTRAVENOUS
  Filled 2023-09-06: qty 2

## 2023-09-06 NOTE — Discharge Instructions (Signed)
Your workup today was reassuring.  No concerning cause of your right lower chest wall pain/upper abdominal pain on the right side.  Follow-up with your primary care doctor.  Return for any emergent symptoms.

## 2023-09-06 NOTE — ED Triage Notes (Signed)
RUQ abdominal pain for several weeks. Pain has been gradually worsening and intensifies after eating.

## 2023-09-06 NOTE — ED Provider Notes (Signed)
[place] EMERGENCY DEPARTMENT AT [facility] Provider Note   CSN: [id number] Arrival date & time: 09/06/23  4098     History  Chief Complaint  Patient presents with   Abdominal Pain    [name] [name] is a 58 y.o. female.  58 year old female presents today for concern of right sided chest wall pain/right upper quadrant abdominal pain.  No other associated symptoms.  She does have history of breast cancer.  Denies pleuritic chest pain.  No prior history of blood clots.  No prior history of CAD.  The history is provided by the patient. No language interpreter was used.       Home Medications Prior to Admission medications   Medication Sig Start Date End Date Taking? Authorizing Provider  AMBULATORY NON FORMULARY MEDICATION Medication Name: Diltiazem 2%/Lidocaine 2%   Using your index finger apply a small amount of medication inside the anal opening and to the external anal area three times daily x 6-8 weeks. 06/10/21   Unk Lightning, PA  amLODipine (NORVASC) 5 MG tablet Take 5 mg by mouth daily.    [provider]  azelastine (OPTIVAR) 0.05 % ophthalmic solution Place 1 drop into both eyes 2 (two) times daily.    [provider]  benzonatate (TESSALON) 100 MG capsule Take 1 capsule (100 mg total) by mouth every 8 (eight) hours. 04/08/23   Carlisle Beers, FNP  cetirizine (ZYRTEC) 10 MG tablet Take 10 mg by mouth daily.    [provider]  Cholecalciferol (VITAMIN D) 50 MCG (2000 UT) CAPS Take 1 capsule (2,000 Units total) by mouth daily. 05/25/22   Langston Reusing, MD  clobetasol ointment (TEMOVATE) 0.05 % Apply 1 application  topically 2 (two) times daily. Then as needed for 30 days 07/03/18   [provider]  clotrimazole-betamethasone (LOTRISONE) cream Apply 1 application topically 2 (two) times daily. For 5-7 days 03/22/21   Pyrtle, Carie Caddy, MD  diclofenac (VOLTAREN) 75 MG EC tablet Take 1 tablet (75 mg  total) by mouth 2 (two) times daily. 11/06/22   Lenn Sink, DPM  guaiFENesin (MUCINEX) 600 MG 12 hr tablet Take 2 tablets (1,200 mg total) by mouth 2 (two) times daily. 04/08/23   Carlisle Beers, FNP  hydrOXYzine (ATARAX) 25 MG tablet Take 25 mg by mouth 3 (three) times daily as needed.    [provider]  losartan (COZAAR) 50 MG tablet Take 50 mg by mouth daily.    [provider]  Menaquinone-7 (VITAMIN K2 PO) Take by mouth.    [provider]  montelukast (SINGULAIR) 10 MG tablet Take 10 mg by mouth at bedtime.    [provider]  ondansetron (ZOFRAN) 4 MG tablet Take 1 tablet (4 mg total) by mouth every 8 (eight) hours as needed for nausea or vomiting. 05/28/23   Lenn Sink, DPM  predniSONE (DELTASONE) 10 MG tablet 12 day tapering dose 06/18/23   Lenn Sink, DPM  sertraline (ZOLOFT) 50 MG tablet Take 1 tablet (50 mg total) by mouth daily. 08/31/22   Langston Reusing, MD  tamoxifen (NOLVADEX) 10 MG tablet TAKE 1 TABLET(10 MG) BY MOUTH TWICE DAILY 08/29/23   Serena Croissant, MD      Allergies    Ibuprofen, Lisinopril, Dilaudid [hydromorphone], Dilaudid [hydromorphone hcl], and Tape    Review of Systems   Review of Systems  Constitutional:  Negative for chills and fever.  Respiratory:  Negative for shortness of breath.  Cardiovascular:  Positive for chest pain (Right lower chest wall pain). Negative for palpitations.  Gastrointestinal:  Positive for abdominal pain.  Neurological:  Negative for light-headedness.  All other systems reviewed and are negative.   Physical Exam Updated Vital Signs BP (!) 144/89 (BP Location: Left Arm)   Pulse 91   Temp 98.5 F (36.9 C) (Oral)   Resp 16   Ht 5\' 6"  (1.676 m)   Wt 103 kg   LMP 09/13/2015 Comment: celebate, BTL  SpO2 100%   BMI 36.64 kg/m  Physical Exam Vitals and nursing note reviewed.  Constitutional:      General: She is not in acute distress.    Appearance: Normal appearance.  She is not ill-appearing.  HENT:     Head: Normocephalic and atraumatic.     Nose: Nose normal.  Eyes:     Conjunctiva/sclera: Conjunctivae normal.  Cardiovascular:     Rate and Rhythm: Normal rate and regular rhythm.  Pulmonary:     Effort: Pulmonary effort is normal. No respiratory distress.  Abdominal:     General: There is no distension.     Palpations: Abdomen is soft.     Tenderness: There is abdominal tenderness. There is no guarding.  Musculoskeletal:        General: No deformity. Normal range of motion.     Cervical back: Normal range of motion.  Skin:    Findings: No rash.  Neurological:     Mental Status: She is alert.     ED Results / Procedures / Treatments   Labs (all labs ordered are listed, but only abnormal results are displayed) Labs Reviewed  COMPREHENSIVE METABOLIC PANEL - Abnormal; Notable for the following components:      Result Value   Glucose, Bld 100 (*)    All other components within normal limits  CBC WITH DIFFERENTIAL/PLATELET - Abnormal; Notable for the following components:   Platelets 134 (*)    All other components within normal limits  LIPASE, BLOOD  URINALYSIS, ROUTINE W REFLEX MICROSCOPIC  TROPONIN I (HIGH SENSITIVITY)  TROPONIN I (HIGH SENSITIVITY)    EKG None  Radiology CT ABDOMEN PELVIS W CONTRAST Result Date: 09/06/2023 CLINICAL DATA:  Right upper quadrant abdominal pain. EXAM: CT ABDOMEN AND PELVIS WITH CONTRAST TECHNIQUE: Multidetector CT imaging of the abdomen and pelvis was performed using the standard protocol following bolus administration of intravenous contrast. RADIATION DOSE REDUCTION: This exam was performed according to the departmental dose-optimization program which includes automated exposure control, adjustment of the mA and/or kV according to patient size and/or use of iterative reconstruction technique. CONTRAST:  OMNIPAQUE IOHEXOL 300 MG/ML  SOLN COMPARISON:  September 25, 2019. FINDINGS: Lower chest: No  acute abnormality. Hepatobiliary: Hepatic steatosis. No cholelithiasis or biliary dilatation. Pancreas: Unremarkable. No pancreatic ductal dilatation or surrounding inflammatory changes. Spleen: Normal in size without focal abnormality. Adrenals/Urinary Tract: Adrenal glands appear normal. Stable right renal cyst for which no further follow-up is required. No hydronephrosis or renal obstruction is noted. Urinary bladder is unremarkable. Stomach/Bowel: Stomach is within normal limits. Appendix appears normal. No evidence of bowel wall thickening, distention, or inflammatory changes. Vascular/Lymphatic: Aortic atherosclerosis. No enlarged abdominal or pelvic lymph nodes. Reproductive: Uterus and bilateral adnexa are unremarkable. Other: No abdominal wall hernia or abnormality. No abdominopelvic ascites. Musculoskeletal: No acute or significant osseous findings. IMPRESSION: Hepatic steatosis. No acute abnormality seen in the abdomen or pelvis. Aortic Atherosclerosis (ICD10-I70.0). Electronically Signed   By: Lupita Raider M.D.   On: 09/06/2023  13:32   DG Chest 2 View Result Date: 09/06/2023 CLINICAL DATA:  Chest pain. EXAM: CHEST - 2 VIEW COMPARISON:  07/22/2020. FINDINGS: Bilateral lung fields are clear. Bilateral costophrenic angles are clear. Normal cardio-mediastinal silhouette. No acute osseous abnormalities. The soft tissues are within normal limits. IMPRESSION: No active cardiopulmonary disease. Electronically Signed   By: Jules Schick M.D.   On: 09/06/2023 08:10    Procedures Procedures    Medications Ordered in ED Medications  ondansetron (ZOFRAN) injection 4 mg (4 mg Intravenous Given 09/06/23 1235)  morphine (PF) 4 MG/ML injection 4 mg (4 mg Intravenous Given 09/06/23 1235)  iohexol (OMNIPAQUE) 300 MG/ML solution 100 mL (100 mLs Intravenous Contrast Given 09/06/23 1248)    ED Course/ Medical Decision Making/ A&P                                 Medical Decision Making Amount and/or  Complexity of Data Reviewed Labs: ordered. Radiology: ordered.  Risk Prescription drug management.   Medical Decision Making / ED Course   This patient presents to the ED for concern of chest pain/right upper quadrant abdominal pain, this involves an extensive number of treatment options, and is a complaint that carries with it a high risk of complications and morbidity.  The differential diagnosis includes cholecystitis, ACS, PE, pneumonia, colitis  MDM: 58 year old female presents today for concern of above-mentioned complaints.  Hemodynamically she is stable.  She is well-appearing without acute distress.  CBC is unremarkable with platelets of 134.  CMP without acute concern.  UA without evidence of UTI.  Lipase within normal, troponin negative x 2.  EKG without acute ischemic change.  Low heart score.  Low suspicion for ACS.  CT abdomen pelvis with contrast without acute intra-abdominal process.  Chest x-ray without acute cardiopulmonary process.  Likely MSK in etiology.  Will provide anti-inflammatory and have her follow-up with her PCP.  She does have an allergy listed for ibuprofen.  However she states this is specifically for ibuprofen and she has tolerated other NSAIDs without issue.  Will prescribe Lodine.  Lab Tests: -I ordered, reviewed, and interpreted labs.   The pertinent results include:   Labs Reviewed  COMPREHENSIVE METABOLIC PANEL - Abnormal; Notable for the following components:      Result Value   Glucose, Bld 100 (*)    All other components within normal limits  CBC WITH DIFFERENTIAL/PLATELET - Abnormal; Notable for the following components:   Platelets 134 (*)    All other components within normal limits  LIPASE, BLOOD  URINALYSIS, ROUTINE W REFLEX MICROSCOPIC  TROPONIN I (HIGH SENSITIVITY)  TROPONIN I (HIGH SENSITIVITY)      EKG  EKG Interpretation Date/Time:    Ventricular Rate:    PR Interval:    QRS Duration:    QT Interval:    QTC Calculation:    R Axis:      Text Interpretation:           Imaging Studies ordered: I ordered imaging studies including ct abdomen pelvis w contrast I independently visualized and interpreted imaging. I agree with the radiologist interpretation   Medicines ordered and prescription drug management: Meds ordered this encounter  Medications   ondansetron (ZOFRAN) injection 4 mg   morphine (PF) 4 MG/ML injection 4 mg   iohexol (OMNIPAQUE) 300 MG/ML solution 100 mL    -I have reviewed the patients home medicines and have made adjustments as needed  Cardiac Monitoring: The patient was maintained on a cardiac monitor.  I personally viewed and interpreted the cardiac monitored which showed an underlying rhythm of: NSR  Reevaluation: After the interventions noted above, I reevaluated the patient and found that they have :stayed the same  Co morbidities that complicate the patient evaluation  Past Medical History:  Diagnosis Date   Allergy    seasonal   Anal fissure    Back pain    Breast cancer (HCC) 2019   R BREAST IDC   Complication of anesthesia    Constipation    Diverticulitis    Fatty liver    GERD (gastroesophageal reflux disease) 07/07/2005   Heartburn    Hemorrhoid    Hepatic steatosis    Hypertension    Internal hemorrhoids    Joint pain    Malignant neoplasm of lower-inner quadrant of right breast of female, estrogen receptor positive (HCC) 07/16/2018   Personal history of chemotherapy    Personal history of radiation therapy    PONV (postoperative nausea and vomiting)    Positive H. pylori test    SOBOE (shortness of breath on exertion)       Dispostion: Discharged in stable condition.  Return precaution discussed.  Patient voices understanding and is in agreement with plan.   Final Clinical Impression(s) / ED Diagnoses Final diagnoses:  Right upper quadrant abdominal pain  Atypical chest pain    Rx / DC Orders ED Discharge Orders     None          Marita Kansas, PA-C 09/06/23 1458    Royanne Foots, DO 09/11/23 1153

## 2023-09-06 NOTE — ED Provider Triage Note (Signed)
Emergency Medicine Provider Triage Evaluation Note  [name] [name] , a 58 y.o. female  was evaluated in triage.  Pt complains of right upper quadrant, right lower chest wall pain.  No associated shortness of breath, diaphoresis.  No prior history of CAD.  Does have history of breast cancer in 2020.  Denies pleuritic chest pain.  Otherwise without hypoxia, tachypnea, or tachycardia.  Review of Systems  Positive: As above Negative: As above  Physical Exam  BP (!) 148/92 (BP Location: Left Arm)   Pulse 94   Temp 98.8 F (37.1 C) (Oral)   Resp 18   Ht 5\' 6"  (1.676 m)   Wt 103 kg   LMP 09/13/2015 Comment: celebate, BTL  SpO2 100%   BMI 36.64 kg/m  Gen:   Awake, no distress   Resp:  Normal effort  MSK:   Moves extremities without difficulty  Other:    Medical Decision Making  Medically screening exam initiated at 7:26 AM.  Appropriate orders placed.  [name] [name] was informed that the remainder of the evaluation will be completed by another provider, this initial triage assessment does not replace that evaluation, and the importance of remaining in the ED until their evaluation is complete.     [name] Kansas, PA-C 09/06/23 [phone]

## 2023-09-06 NOTE — ED Notes (Signed)
Pt refusing EKG at this time

## 2023-10-04 ENCOUNTER — Ambulatory Visit
Admission: RE | Admit: 2023-10-04 | Discharge: 2023-10-04 | Disposition: A | Discharge status: Home / Self Care | Payer: BC Managed Care – PPO | Source: Ambulatory Visit | Attending: Hematology and Oncology | Admitting: Hematology and Oncology

## 2023-10-04 DIAGNOSIS — Z1231 Encounter for screening mammogram for malignant neoplasm of breast: Secondary | ICD-10-CM

## 2023-10-23 ENCOUNTER — Inpatient Hospital Stay: Payer: BC Managed Care – PPO | Attending: Hematology and Oncology | Admitting: Hematology and Oncology

## 2023-10-23 ENCOUNTER — Encounter: Payer: Self-pay | Admitting: *Deleted

## 2023-10-23 VITALS — BP 130/78 | HR 105 | Temp 97.8°F | Resp 18 | Ht 66.0 in | Wt 252.0 lb

## 2023-10-23 DIAGNOSIS — Z170 Estrogen receptor positive status [ER+]: Secondary | ICD-10-CM | POA: Diagnosis not present

## 2023-10-23 DIAGNOSIS — Z79810 Long term (current) use of selective estrogen receptor modulators (SERMs): Secondary | ICD-10-CM | POA: Insufficient documentation

## 2023-10-23 DIAGNOSIS — C50311 Malignant neoplasm of lower-inner quadrant of right female breast: Secondary | ICD-10-CM | POA: Insufficient documentation

## 2023-10-23 NOTE — Progress Notes (Signed)
Per MD request RN successfully faxed [facility] request ([phone]). 

## 2023-10-23 NOTE — Assessment & Plan Note (Signed)
 08/23/2018:Right lumpectomy: IDC, 0.7 cm, grade 3, margins negative, 0/3 lymph nodes negative, ER 40%, PR 0%, HER-2 negative, Ki-67 50%, T1BN0 stage Ia   Oncotype Dx 44: High Risk   Treatment plan: 1. Adj Chemo with Dose dense adriamycin and Cytoxan foll by Taxol weekly X 10 stopped early for toxicities 2. Adjuvant radiation therapy 02/20/2019 -03/28/2019  3. Adjuvant antiestrogen therapy ------------------------------------------------------------------------------------------------------------ Port will be removed 04/15/2019 Treatment plan: Adjuvant antiestrogen therapy with anastrozole 1 mg daily started September 2020, switched to letrozole, switching to exemestane started 08/21/2020 switched to tamoxifen 02/23/2022   Severe back pain issues: Improving after she stopped letrozole but still she experiences some of this even on exemestane.   Tamoxifen toxicities: She is tolerating the 10 mg dose extremely well. She is still grieving the loss of her grandson in 2022.  She is going through counseling.   Breast cancer surveillance: 1.  Breast exam 10/23/2023: Benign 2. Mammogram 10/08/2023 benign breast density category B 3.  CT abdomen 09/06/2023: Fatty liver   RTC in 1 year

## 2023-10-23 NOTE — Progress Notes (Signed)
 Patient Care Team: [name], Durward Fortes, MD as PCP - General (Family Medicine) Harriette Bouillon, MD as Consulting Physician (General Surgery) Serena Croissant, MD as Consulting Physician (Hematology and Oncology) Dorothy Puffer, MD as Consulting Physician (Radiation Oncology)  DIAGNOSIS:  Encounter Diagnosis  Name Primary?   Malignant neoplasm of lower-inner quadrant of right breast of female, estrogen receptor positive (HCC) Yes    SUMMARY OF ONCOLOGIC HISTORY: Oncology History  Malignant neoplasm of lower-inner quadrant of right breast of female, estrogen receptor positive (HCC)  07/16/2018 Initial Diagnosis   Pain right breast UOQ: Negative on mammogram, incidentally found right breast mass LIQ at 3:30 position measuring 0.6 cm biopsy revealed grade 3 IDC ER 40%, PR 0%, HER-2 negative, Ki-67 50%, T1bN0 stage Ib   07/24/2018 Cancer Staging   Staging form: Breast, AJCC 8th Edition - Clinical: Stage IB (cT1b, cN0, cM0, G3, ER+, PR-, HER2-) - Signed by Serena Croissant, MD on 07/24/2018   08/23/2018 Surgery   Right lumpectomy: IDC, 0.7 cm, grade 3, margins negative, 0/3 lymph nodes negative, ER 40%, PR 0%, HER-2 negative, Ki-67 50%, T1BN0 stage Ia   08/23/2018 Oncotype testing   45/33%   09/04/2018 Cancer Staging   Staging form: Breast, AJCC 8th Edition - Pathologic: Stage IA (pT1b, pN0, cM0, G3, ER+, PR-, HER2-) - Signed by Loa Socks, NP on 09/04/2018   09/25/2018 - 01/22/2019 Chemotherapy   Adjuvant chemotherapy: completed 4 cycles dose dense Adriamycin and Cytoxan, completed 10 cycles of Taxol, stopped early due to neuropathy    02/19/2019 - 04/07/2019 Radiation Therapy   Adjuvant XRT The patient initially received a dose of 50.4 Gy in 28 fractions to the breast using whole-breast tangent fields. This was delivered using a 3-D conformal technique. The patient then received a boost to the seroma. This delivered an additional 10 Gy in 5 fractions using a 3-field photon boost technique. The  total dose was 60.4 Gy.   04/2019 -  Anti-estrogen oral therapy   Anastrozole switched to letrozole switched to Exemestane      CHIEF COMPLIANT: Follow-up on tamoxifen therapy  HISTORY OF PRESENT ILLNESS:   History of Present Illness The patient, a breast cancer survivor, presents for a routine follow-up five years post-diagnosis. She reports tolerating tamoxifen well, with the exception of spasms under the breast. The spasms are managed with hydration and moisturizing the breasts to prevent dryness. The patient had a mammogram last month, which showed no concerning findings.     ALLERGIES:  is allergic to ibuprofen, lisinopril, dilaudid [hydromorphone], dilaudid [hydromorphone hcl], and tape.  MEDICATIONS:  Current Outpatient Medications  Medication Sig Dispense Refill   AMBULATORY NON FORMULARY MEDICATION Medication Name: Diltiazem 2%/Lidocaine 2%   Using your index finger apply a small amount of medication inside the anal opening and to the external anal area three times daily x 6-8 weeks. 30 g 1   amLODipine (NORVASC) 5 MG tablet Take 5 mg by mouth daily.     azelastine (OPTIVAR) 0.05 % ophthalmic solution Place 1 drop into both eyes 2 (two) times daily.     benzonatate (TESSALON) 100 MG capsule Take 1 capsule (100 mg total) by mouth every 8 (eight) hours. 21 capsule 0   cetirizine (ZYRTEC) 10 MG tablet Take 10 mg by mouth daily.     Cholecalciferol (VITAMIN D) 50 MCG (2000 UT) CAPS Take 1 capsule (2,000 Units total) by mouth daily. 30 capsule 0   clobetasol ointment (TEMOVATE) 0.05 % Apply 1 application  topically 2 (two)  times daily. Then as needed for 30 days  1   clotrimazole-betamethasone (LOTRISONE) cream Apply 1 application topically 2 (two) times daily. For 5-7 days 30 g 0   etodolac (LODINE) 400 MG tablet Take 1 tablet (400 mg total) by mouth 2 (two) times daily. 20 tablet 0   guaiFENesin (MUCINEX) 600 MG 12 hr tablet Take 2 tablets (1,200 mg total) by mouth 2 (two) times  daily. 30 tablet 0   hydrOXYzine (ATARAX) 25 MG tablet Take 25 mg by mouth 3 (three) times daily as needed.     losartan (COZAAR) 50 MG tablet Take 50 mg by mouth daily.     Menaquinone-7 (VITAMIN K2 PO) Take by mouth.     montelukast (SINGULAIR) 10 MG tablet Take 10 mg by mouth at bedtime.     ondansetron (ZOFRAN) 4 MG tablet Take 1 tablet (4 mg total) by mouth every 8 (eight) hours as needed for nausea or vomiting. 20 tablet 0   predniSONE (DELTASONE) 10 MG tablet 12 day tapering dose 48 tablet 0   sertraline (ZOLOFT) 50 MG tablet Take 1 tablet (50 mg total) by mouth daily. 90 tablet 1   tamoxifen (NOLVADEX) 10 MG tablet TAKE 1 TABLET(10 MG) BY MOUTH TWICE DAILY 90 tablet 3   No current facility-administered medications for this visit.    PHYSICAL EXAMINATION: ECOG PERFORMANCE STATUS: 1 - Symptomatic but completely ambulatory  Vitals:   10/23/23 0917  BP: 130/78  Pulse: (!) 105  Resp: 18  Temp: 97.8 F (36.6 C)  SpO2: 100%   Filed Weights   10/23/23 0917  Weight: 252 lb (114.3 kg)    Physical Exam No palpable lumps or nodules in bilateral breasts or axilla  (exam performed in the presence of a chaperone)  LABORATORY DATA:  I have reviewed the data as listed    Latest Ref Rng & Units 09/06/2023    7:27 AM 04/27/2022   12:31 PM 03/10/2022   10:27 AM  CMP  Glucose 70 - 99 mg/dL 034  75  99   BUN 6 - 20 mg/dL 13  9  13    Creatinine 0.44 - 1.00 mg/dL 7.42  5.95  6.38   Sodium 135 - 145 mmol/L 138  142  139   Potassium 3.5 - 5.1 mmol/L 4.1  3.8  3.8   Chloride 98 - 111 mmol/L 103  105  105   CO2 22 - 32 mmol/L 24  21  29    Calcium 8.9 - 10.3 mg/dL 9.3  8.9  9.4   Total Protein 6.5 - 8.1 g/dL 7.6  6.8  7.2   Total Bilirubin 0.0 - 1.2 mg/dL 0.6  0.4  0.5   Alkaline Phos 38 - 126 U/L 58  76  63   AST 15 - 41 U/L 31  23  22    ALT 0 - 44 U/L 24  18  14      Lab Results  Component Value Date   WBC 5.7 09/06/2023   HGB 12.6 09/06/2023   HCT 39.3 09/06/2023   MCV 95.9  09/06/2023   PLT 134 (L) 09/06/2023   NEUTROABS 3.8 09/06/2023    ASSESSMENT & PLAN:  Malignant neoplasm of lower-inner quadrant of right breast of female, estrogen receptor positive (HCC) 08/23/2018:Right lumpectomy: IDC, 0.7 cm, grade 3, margins negative, 0/3 lymph nodes negative, ER 40%, PR 0%, HER-2 negative, Ki-67 50%, T1BN0 stage Ia   Oncotype Dx 44: High Risk   Treatment plan: 1. Adj Chemo with  Dose dense adriamycin and Cytoxan foll by Taxol weekly X 10 stopped early for toxicities 2. Adjuvant radiation therapy 02/20/2019 -03/28/2019  3. Adjuvant antiestrogen therapy (currently with tamoxifen 10 mg daily) ------------------------------------------------------------------------------------------------------------ Treatment plan: Adjuvant antiestrogen therapy with anastrozole 1 mg daily started September 2020, switched to letrozole, switching to exemestane started 08/21/2020 switched to tamoxifen 02/23/2022   Severe back pain issues: Improving after she stopped letrozole but still she experiences some of this even on exemestane.   Tamoxifen toxicities: She is tolerating the 10 mg dose extremely well. She is still grieving the loss of her grandson in 2022.  She is going through counseling.   Weight gain: She has been exercising quite regularly but in spite of that she has not been able to lose weight.  I discussed with her about oral semaglutide when it comes available as an option and she will also try other measures before then to reduce her weight.  Breast cancer surveillance: 1.  Breast exam 10/23/2023: Benign 2. Mammogram 10/08/2023 benign breast density category B 3.  CT abdomen 09/06/2023: Fatty liver   RTC in 1 year   No orders of the defined types were placed in this encounter.  The patient has a good understanding of the overall plan. she agrees with it. she will call with any problems that may develop before the next visit here. Total time spent: 30 mins including face to face  time and time spent for planning, charting and co-ordination of care   Tamsen Meek, MD 10/23/23

## 2023-11-16 ENCOUNTER — Telehealth: Payer: Self-pay | Admitting: *Deleted

## 2023-11-16 NOTE — Telephone Encounter (Signed)
 Received call from [facility] team stating they have not heard back from pt regarding financial status to proceed with testing.  RN contacted pt and provided pt with customer service number of [phone]).  Pt states she will reach out.

## 2023-11-29 ENCOUNTER — Telehealth: Payer: Self-pay | Admitting: *Deleted

## 2023-11-29 NOTE — Telephone Encounter (Signed)
 RN attempt x1 to contact pt regarding [facility] results. No answer, LVM for pt to return call to the office.

## 2023-11-29 NOTE — Telephone Encounter (Signed)
 Per MD request RN placed call to pt regarding [facility] showing pt would benefit from extended endocrine therapy.  Pt notified of results and verbalized understanding.

## 2023-11-30 ENCOUNTER — Encounter: Payer: Self-pay | Admitting: Hematology and Oncology

## 2023-12-24 ENCOUNTER — Other Ambulatory Visit: Payer: Self-pay

## 2023-12-24 ENCOUNTER — Emergency Department (HOSPITAL_COMMUNITY)
Admission: EM | Admit: 2023-12-24 | Discharge: 2023-12-24 | Disposition: A | Discharge status: Home / Self Care | Attending: Emergency Medicine | Admitting: Emergency Medicine

## 2023-12-24 ENCOUNTER — Emergency Department (HOSPITAL_COMMUNITY)

## 2023-12-24 ENCOUNTER — Encounter (HOSPITAL_COMMUNITY): Payer: Self-pay

## 2023-12-24 DIAGNOSIS — I10 Essential (primary) hypertension: Secondary | ICD-10-CM | POA: Diagnosis not present

## 2023-12-24 DIAGNOSIS — M545 Low back pain, unspecified: Secondary | ICD-10-CM | POA: Insufficient documentation

## 2023-12-24 DIAGNOSIS — Z853 Personal history of malignant neoplasm of breast: Secondary | ICD-10-CM | POA: Insufficient documentation

## 2023-12-24 DIAGNOSIS — Z79899 Other long term (current) drug therapy: Secondary | ICD-10-CM | POA: Insufficient documentation

## 2023-12-24 DIAGNOSIS — R1030 Lower abdominal pain, unspecified: Secondary | ICD-10-CM | POA: Diagnosis present

## 2023-12-24 LAB — URINALYSIS, ROUTINE W REFLEX MICROSCOPIC
Bilirubin Urine: NEGATIVE
Glucose, UA: NEGATIVE mg/dL
Hgb urine dipstick: NEGATIVE
Ketones, ur: NEGATIVE mg/dL
Leukocytes,Ua: NEGATIVE
Nitrite: NEGATIVE
Protein, ur: NEGATIVE mg/dL
Specific Gravity, Urine: 1.025 (ref 1.005–1.030)
pH: 5 (ref 5.0–8.0)

## 2023-12-24 NOTE — Discharge Instructions (Addendum)
 Your urine did not show any signs of infection.  We also did not see any signs of blood in your urine with the sample you gave today.  Your CT scan is reassuring today.  You did not have any signs of a kidney stone.  No signs of diverticulitis or other infection.  You did have a cyst on your right kidney and some fat in your liver.  These are incidental findings that would not explain the pain you are having.  Please make your PCP aware of these findings.  I have attached your CT scan results below for your reference.  Please follow-up with your PCP if symptoms are not improving within the next 3 days.  Return to the ER for any severe worsening of your pain, fevers, repetitive vomiting, any other new or concerning symptoms  "EXAM: CT ABDOMEN AND PELVIS WITHOUT CONTRAST   TECHNIQUE: Multidetector CT imaging of the abdomen and pelvis was performed following the standard protocol without IV contrast.   RADIATION DOSE REDUCTION: This exam was performed according to the departmental dose-optimization program which includes automated exposure control, adjustment of the mA and/or kV according to patient size and/or use of iterative reconstruction technique.   COMPARISON:  September 06, 2023   FINDINGS: Of note, the lack of intravenous contrast limits evaluation of the solid organ parenchyma and vascularity.   Lower chest: No focal airspace consolidation or pleural effusion.   Hepatobiliary: Diffuse hepatic steatosis. No mass. Decompressed gallbladder without radiopaque stones or wall thickening. No intrahepatic or extrahepatic biliary ductal dilation.   Pancreas: No mass or main ductal dilation. No peripancreatic inflammation or fluid collection.   Spleen: Normal size. No mass.   Adrenals/Urinary Tract: No adrenal masses. Redemonstrated right lower pole cyst measuring 5.1 cm, unchanged. No hydronephrosis or nephrolithiasis. The urinary bladder is completely decompressed.    Stomach/Bowel: The stomach is decompressed without focal abnormality. No small bowel wall thickening or inflammation. No small bowel obstruction.Normal appendix. Total colonic diverticulosis. No changes of acute diverticulitis.   Vascular/Lymphatic: No aortic aneurysm. Diffuse aortoiliac atherosclerosis. No intraabdominal or pelvic lymphadenopathy.   Reproductive: Age-related atrophy of the uterus and ovaries. No concerning adnexal mass. No free pelvic fluid.   Other: No pneumoperitoneum, ascites, or mesenteric inflammation.   Musculoskeletal: No acute fracture or destructive lesion. Multilevel degenerative disc disease of the spine.   IMPRESSION: 1.  No acute intra-abdominal or pelvic abnormality. 2. Diffuse hepatic steatosis. 3. Total colonic diverticulosis. No changes of acute diverticulitis.   Aortic Atherosclerosis (ICD10-I70.0).     Electronically Signed   By: Rance Burrows M.D.   On: 12/24/2023 14:11"

## 2023-12-24 NOTE — ED Provider Notes (Signed)
 [place] EMERGENCY DEPARTMENT AT [facility] Provider Note   CSN: [id number] Arrival date & time: 12/24/23  1151     History  Chief Complaint  Patient presents with   Flank Pain    [name] [name] is a 58 y.o. female with history of diverticulitis, breast cancer, constipation, hypertension, presents with concern for left-sided lower back pain that radiates into her left lower abdomen.  States this started yesterday.  She saw her primary care this morning and reportedly had blood in her urine and so they sent her to the ER for further evaluation.  Denies any fever, chills, nausea or vomiting.  Reports normal bowel movements with her last one being this morning.  No diarrhea.  Denies any dysuria or increased frequency.   Flank Pain       Home Medications Prior to Admission medications   Medication Sig Start Date End Date Taking? Authorizing Provider  AMBULATORY NON FORMULARY MEDICATION Medication Name: Diltiazem  2%/Lidocaine  2%   Using your index finger apply a small amount of medication inside the anal opening and to the external anal area three times daily x 6-8 weeks. 06/10/21   Graciella Lavender, PA  amLODipine  (NORVASC ) 5 MG tablet Take 5 mg by mouth daily.    [provider]  azelastine (OPTIVAR) 0.05 % ophthalmic solution Place 1 drop into both eyes 2 (two) times daily.    [provider]  benzonatate  (TESSALON ) 100 MG capsule Take 1 capsule (100 mg total) by mouth every 8 (eight) hours. 04/08/23   Starlene Eaton, FNP  cetirizine (ZYRTEC) 10 MG tablet Take 10 mg by mouth daily.    [provider]  Cholecalciferol (VITAMIN D ) 50 MCG (2000 UT) CAPS Take 1 capsule (2,000 Units total) by mouth daily. 05/25/22   Jenean Minus, MD  clobetasol  ointment (TEMOVATE ) 0.05 % Apply 1 application  topically 2 (two) times daily. Then as needed for 30 days 07/03/18   [provider]  clotrimazole -betamethasone   (LOTRISONE ) cream Apply 1 application topically 2 (two) times daily. For 5-7 days 03/22/21   Pyrtle, Amber Bail, MD  etodolac  (LODINE ) 400 MG tablet Take 1 tablet (400 mg total) by mouth 2 (two) times daily. 09/06/23   Ceclia Cohens, Amjad, PA-C  guaiFENesin  (MUCINEX ) 600 MG 12 hr tablet Take 2 tablets (1,200 mg total) by mouth 2 (two) times daily. 04/08/23   Starlene Eaton, FNP  hydrOXYzine (ATARAX) 25 MG tablet Take 25 mg by mouth 3 (three) times daily as needed.    [provider]  losartan  (COZAAR ) 50 MG tablet Take 50 mg by mouth daily.    [provider]  Menaquinone-7 (VITAMIN K2 PO) Take by mouth.    [provider]  montelukast (SINGULAIR) 10 MG tablet Take 10 mg by mouth at bedtime.    [provider]  ondansetron  (ZOFRAN ) 4 MG tablet Take 1 tablet (4 mg total) by mouth every 8 (eight) hours as needed for nausea or vomiting. 05/28/23   Brandt Cake, DPM  predniSONE  (DELTASONE ) 10 MG tablet 12 day tapering dose 06/18/23   Brandt Cake, DPM  sertraline  (ZOLOFT ) 50 MG tablet Take 1 tablet (50 mg total) by mouth daily. 08/31/22   Jenean Minus, MD  tamoxifen  (NOLVADEX ) 10 MG tablet TAKE 1 TABLET(10 MG) BY MOUTH TWICE DAILY 08/29/23   Gudena, Vinay, MD      Allergies    Ibuprofen, Lisinopril, Dilaudid  [hydromorphone ], Dilaudid  [hydromorphone  hcl], and Tape    Review  of Systems   Review of Systems  Genitourinary:  Positive for flank pain.    Physical Exam Updated Vital Signs BP (!) 157/100   Pulse 93   Temp 98.2 F (36.8 C) (Oral)   Resp 16   Ht 5\' 6"  (1.676 m)   Wt 105.2 kg   LMP 09/13/2015 Comment: celebate, BTL  SpO2 95%   BMI 37.45 kg/m  Physical Exam Vitals and nursing note reviewed.  Constitutional:      General: She is not in acute distress.    Appearance: She is well-developed.     Comments: Well-appearing, moving around comfortably.  No active vomiting.  HENT:     Head: Normocephalic and atraumatic.  Eyes:      Conjunctiva/sclera: Conjunctivae normal.  Cardiovascular:     Rate and Rhythm: Normal rate and regular rhythm.     Heart sounds: No murmur heard. Pulmonary:     Effort: Pulmonary effort is normal. No respiratory distress.     Breath sounds: Normal breath sounds.  Abdominal:     Palpations: Abdomen is soft.     Tenderness: There is no abdominal tenderness.     Comments: No CVA tenderness bilaterally Abdomen soft nontender, no rebound or guarding  Musculoskeletal:        General: No swelling.     Cervical back: Neck supple.     Comments: No tenderness to the musculature of the lower back bilaterally  Skin:    General: Skin is warm and dry.     Capillary Refill: Capillary refill takes less than 2 seconds.  Neurological:     Mental Status: She is alert.  Psychiatric:        Mood and Affect: Mood normal.     ED Results / Procedures / Treatments   Labs (all labs ordered are listed, but only abnormal results are displayed) Labs Reviewed  URINALYSIS, ROUTINE W REFLEX MICROSCOPIC    EKG None  Radiology CT Renal Stone Study Result Date: 12/24/2023 CLINICAL DATA:  Urolithiasis, symptomatic, left-sided flank pain radiating to the lower abdomen EXAM: CT ABDOMEN AND PELVIS WITHOUT CONTRAST TECHNIQUE: Multidetector CT imaging of the abdomen and pelvis was performed following the standard protocol without IV contrast. RADIATION DOSE REDUCTION: This exam was performed according to the departmental dose-optimization program which includes automated exposure control, adjustment of the mA and/or kV according to patient size and/or use of iterative reconstruction technique. COMPARISON:  September 06, 2023 FINDINGS: Of note, the lack of intravenous contrast limits evaluation of the solid organ parenchyma and vascularity. Lower chest: No focal airspace consolidation or pleural effusion. Hepatobiliary: Diffuse hepatic steatosis. No mass. Decompressed gallbladder without radiopaque stones or wall  thickening. No intrahepatic or extrahepatic biliary ductal dilation. Pancreas: No mass or main ductal dilation. No peripancreatic inflammation or fluid collection. Spleen: Normal size. No mass. Adrenals/Urinary Tract: No adrenal masses. Redemonstrated right lower pole cyst measuring 5.1 cm, unchanged. No hydronephrosis or nephrolithiasis. The urinary bladder is completely decompressed. Stomach/Bowel: The stomach is decompressed without focal abnormality. No small bowel wall thickening or inflammation. No small bowel obstruction.Normal appendix. Total colonic diverticulosis. No changes of acute diverticulitis. Vascular/Lymphatic: No aortic aneurysm. Diffuse aortoiliac atherosclerosis. No intraabdominal or pelvic lymphadenopathy. Reproductive: Age-related atrophy of the uterus and ovaries. No concerning adnexal mass. No free pelvic fluid. Other: No pneumoperitoneum, ascites, or mesenteric inflammation. Musculoskeletal: No acute fracture or destructive lesion. Multilevel degenerative disc disease of the spine. IMPRESSION: 1.  No acute intra-abdominal or pelvic abnormality. 2. Diffuse hepatic steatosis. 3. Total  colonic diverticulosis. No changes of acute diverticulitis. Aortic Atherosclerosis (ICD10-I70.0). Electronically Signed   By: Rance Burrows M.D.   On: 12/24/2023 14:11    Procedures Procedures    Medications Ordered in ED Medications - No data to display  ED Course/ Medical Decision Making/ A&P                                 Medical Decision Making    Differential diagnosis includes but is not limited to Cholelithiasis, cholangitis, choledocholithiasis, peptic ulcer, gastritis, gastroenteritis, appendicitis, IBS, IBD, DKA, nephrolithiasis, UTI, pyelonephritis, pancreatitis, diverticulitis, mesenteric ischemia, abdominal aortic aneurysm, small bowel obstruction, volvulus   ED Course:  Patient is well-appearing, stable vitals aside from elevated blood pressure of 157/100 upon arrival.   She is reporting left lower back pain that wraps around to the abdomen.  Abdomen is soft and nontender on exam.  No CVA tenderness bilaterally.  No active vomiting.  Urinalysis without any signs of UTI.  No blood in her urine here in the ER.  CT scan was obtained for further evaluation.  This was reassuring with no acute intra-abdominal or pelvic abnormality noted.  Discussed these findings with the patient and notified her of the incidental findings of right renal cyst and hepatic steatosis.  We discussed pain could possibly due to constipation, but she reports normal bowel movements and does take MiraLAX at home already.  No concern for SBO she is still having normal bowel movements and passing gas.  Does not seem like musculoskeletal pain given no injury no tenderness palpation of the left lower back.  Low concern for emergent pathology at this time given reassuring CT scan and urine results.  Do not feel further labs are needed at this time. discussed that she will need to follow-up with her primary and GI doctor for further evaluation.  She declines Bentyl for pain, and states she will reach out to her primary for further management.  Stable and appropriate for discharge home  Labs Ordered: I Ordered, and personally interpreted labs.  The pertinent results include:   Urinalysis without nitrites, leukocytes, no red blood cells  Imaging Studies ordered: I ordered imaging studies including CT renal I independently visualized the imaging with scope of interpretation limited to determining acute life threatening conditions related to emergency care. Imaging showed no acute intra-abdominal or pelvic abnormality I agree with the radiologist interpretation   Impression: Left-sided abdominal pain and back pain  Disposition:  The patient was discharged home with instructions to follow-up with PCP if symptoms not improving within the next 3 days. Return precautions given.   This chart was dictated  using voice recognition software, Dragon. Despite the best efforts of this provider to proofread and correct errors, errors may still occur which can change documentation meaning.          Final Clinical Impression(s) / ED Diagnoses Final diagnoses:  Lower abdominal pain    Rx / DC Orders ED Discharge Orders     None         Rexie Catena, PA-C 12/24/23 1603    Teddi Favors, DO 12/28/23 [id number]

## 2023-12-24 NOTE — ED Triage Notes (Signed)
 Left sided flank pain that radiates into lower abdomen that started yesterday, saw PCP this AM and blood was in urine. Nausea, no vomiting.

## 2024-04-09 ENCOUNTER — Other Ambulatory Visit: Payer: Self-pay | Admitting: Internal Medicine

## 2024-04-09 DIAGNOSIS — N6321 Unspecified lump in the left breast, upper outer quadrant: Secondary | ICD-10-CM

## 2024-04-25 ENCOUNTER — Ambulatory Visit
Admission: RE | Admit: 2024-04-25 | Discharge: 2024-04-25 | Disposition: A | Discharge status: Home / Self Care | Source: Ambulatory Visit | Attending: Internal Medicine | Admitting: Internal Medicine

## 2024-04-25 DIAGNOSIS — N6321 Unspecified lump in the left breast, upper outer quadrant: Secondary | ICD-10-CM

## 2024-05-07 ENCOUNTER — Ambulatory Visit: Admitting: Cardiology

## 2024-05-19 ENCOUNTER — Encounter: Payer: Self-pay | Admitting: Cardiology

## 2024-05-19 ENCOUNTER — Ambulatory Visit (INDEPENDENT_AMBULATORY_CARE_PROVIDER_SITE_OTHER)

## 2024-05-19 ENCOUNTER — Encounter: Payer: Self-pay | Admitting: Podiatry

## 2024-05-19 ENCOUNTER — Ambulatory Visit: Attending: Cardiology | Admitting: Cardiology

## 2024-05-19 ENCOUNTER — Ambulatory Visit: Admitting: Podiatry

## 2024-05-19 VITALS — BP 142/86 | HR 93 | Resp 16 | Ht 66.0 in | Wt 251.2 lb

## 2024-05-19 DIAGNOSIS — M778 Other enthesopathies, not elsewhere classified: Secondary | ICD-10-CM | POA: Diagnosis not present

## 2024-05-19 DIAGNOSIS — M7672 Peroneal tendinitis, left leg: Secondary | ICD-10-CM | POA: Diagnosis not present

## 2024-05-19 DIAGNOSIS — R072 Precordial pain: Secondary | ICD-10-CM | POA: Insufficient documentation

## 2024-05-19 DIAGNOSIS — I10 Essential (primary) hypertension: Secondary | ICD-10-CM

## 2024-05-19 DIAGNOSIS — M7752 Other enthesopathy of left foot: Secondary | ICD-10-CM

## 2024-05-19 DIAGNOSIS — R931 Abnormal findings on diagnostic imaging of heart and coronary circulation: Secondary | ICD-10-CM

## 2024-05-19 MED ORDER — AMLODIPINE BESYLATE 5 MG PO TABS: 10.0000 mg | ORAL_TABLET | Freq: Every day | ORAL | Status: AC

## 2024-05-19 MED ORDER — DICLOFENAC SODIUM 75 MG PO TBEC: 75.0000 mg | DELAYED_RELEASE_TABLET | Freq: Two times a day (BID) | ORAL | 2 refills | Status: AC

## 2024-05-19 MED ORDER — TRIAMCINOLONE ACETONIDE 10 MG/ML IJ SUSP
10.0000 mg | Freq: Once | INTRAMUSCULAR | Status: AC
  Administered 2024-05-19: 10 mg via INTRA_ARTICULAR

## 2024-05-19 NOTE — Progress Notes (Signed)
 Cardiology Office Note:  .   Date:  05/19/2024  ID:  [name] [name], DOB 24-Dec-1965, MRN [id number] PCP: Tonna Karna Jean, FNP  [facility] Providers Cardiologist:  Newman Lawrence, MD PCP: Tonna Karna Jean, FNP  Chief Complaint  Patient presents with   Chest Pain     [name] [name] is a 58 y.o. female with hypertension, hyperlipidemia, chest pain  Discussed the use of AI scribe software for clinical note transcription with the patient, who gave verbal consent to proceed.  History of Present Illness [name] [name] is a 58 year old female with hypertension and breast cancer who presents with chest pain.  She experiences sharp, tight chest pain intermittently, lasting for seconds, and describes it as 'real sharp'. The pain is alleviated by drinking cold water and does not occur during physical activity, such as walking over ten thousand steps daily. There is no associated shortness of breath or sweating.  She takes amlodipine  5 mg and losartan  50 mg daily for hypertension. Home blood pressure readings are generally in the 140s/80s range, sometimes lower.  She was diagnosed with breast cancer in 2019, underwent surgery in 2020, followed by chemotherapy and radiation. She is currently on tamoxifen  daily.      Vitals:   05/19/24 1002  BP: (!) 142/86  Pulse: 93  Resp: 16  SpO2: 99%      Review of Systems  Cardiovascular:  Positive for chest pain. Negative for dyspnea on exertion, leg swelling, palpitations and syncope.        Studies Reviewed: [name] [name]        EKG 05/19/2024: Normal sinus rhythm Normal ECG When compared with ECG of 06-Sep-2023 12:06, No significant change was found    Labs 08/2023: Hb 12.6 Cr 0.66 Trop HS 2, 4  2023: HbA1C 5.5% Chol 193, TG 113, HDL 44, LDL 129  Physical Exam Vitals and nursing note reviewed.  Constitutional:      General: She is not in acute distress. Neck:     Vascular: No  JVD.  Cardiovascular:     Rate and Rhythm: Normal rate and regular rhythm.     Heart sounds: Normal heart sounds. No murmur heard. Pulmonary:     Effort: Pulmonary effort is normal.     Breath sounds: Normal breath sounds. No wheezing or rales.  Musculoskeletal:     Right lower leg: No edema.     Left lower leg: No edema.      VISIT DIAGNOSES:   ICD-10-CM   1. Precordial pain  R07.2 EKG 12-Lead    2. Elevated coronary artery calcium  score  R93.1     3. Primary hypertension  I10        [name] [name] is a 58 y.o. female with hypertension, hyperlipidemia, chest pain  Assessment & Plan Precordial pain:  Intermittent sharp chest pain, relieved by cold water, not exertion-related. Likely musculoskeletal or GERD, not cardiac. No further testing needed at this time from cardiac standpoint. Report if chest pain occurs with exertion.  Mild coronary artery calcification: Mild coronary calcification noted on CT cardiac scoring scan in 2021.   She was previously recommended Crestor  10 mg daily, currently not taking.  She will discuss further with PCP and likely start on statin then.  Hypertension: Blood pressure in 140s/80s, above target <130/80. Current regimen: amlodipine  5 mg, losartan  50 mg. - Increase amlodipine  to 10 mg daily. - Discuss with primary care physician for a new prescription for amlodipine  10 mg. -  Monitor blood pressure regularly.     Meds ordered this encounter  Medications   amLODipine  (NORVASC ) 5 MG tablet    Sig: Take 2 tablets (10 mg total) by mouth daily.     F/u as needed  Signed, Newman JINNY Lawrence, MD

## 2024-05-19 NOTE — Patient Instructions (Signed)
 Medication Instructions:  INCREASE Amlodipine  to 10 mg daily   *If you need a refill on your cardiac medications before your next appointment, please call your pharmacy*  Follow-Up: At [facility], you and your health needs are our priority.  As part of our continuing mission to provide you with exceptional heart care, our providers are all part of one team.  This team includes your primary Cardiologist (physician) and Advanced Practice Providers or APPs (Physician Assistants and Nurse Practitioners) who all work together to provide you with the care you need, when you need it.  Your next appointment:   As needed   Provider:   Newman JINNY Lawrence, MD

## 2024-05-19 NOTE — Progress Notes (Signed)
 Subjective:   Patient ID: [name] [name], female   DOB: 58 y.o.   MRN: [id number]   HPI Patient states she developed a lot of pain in the outside of her left foot and does not remember injury associated with this.  States has been hard to walk on and has been going on for around a month.  Very happy with the right foot from surgery   ROS      Objective:  Physical Exam  Neurovascular status intact inflammation pain of the peroneal tendon and peroneal tertius left with no indication of tendon dysfunction or muscle issue     Assessment:  Peroneal tendinitis left with inflammation and pain     Plan:  H&P x-ray reviewed condition discussed explained to patient at this point I recommend an injection and explained risk of injection.  She is willing to accept this wants this done and at this point sterile prep and injected the peroneal sheath 3 mg dexamethasone  Kenalog  5 mg Xylocaine .  I also dispensed a fascial brace to lift up the lateral side of the foot explaining how to use this properly and fitted properly to her foot  X-rays indicate that there is slight irritation of the bone structure but no indications of arthritis or fracture of the bone base of fifth metatarsal left

## 2024-06-02 ENCOUNTER — Encounter: Payer: Self-pay | Admitting: Podiatry

## 2024-06-02 ENCOUNTER — Ambulatory Visit: Admitting: Podiatry

## 2024-06-02 DIAGNOSIS — M7672 Peroneal tendinitis, left leg: Secondary | ICD-10-CM

## 2024-06-02 NOTE — Progress Notes (Signed)
 Subjective:   Patient ID: [name] [name], female   DOB: 57 y.o.   MRN: [id number]   HPI Patient states improving quite a bit but still having moderate discomfort   ROS      Objective:  Physical Exam  Neurovascular status intact muscle strength adequate discomfort still lateral left foot improved but still present     Assessment:  Peroneal tendinitis improving still present     Plan:  H&P reviewed and I advised this patient on aggressive ice therapy wearing the boot she has at home part-time in the brace still taking her medicine and we reviewed reducing it slowly over the next few weeks and if symptoms persist I want to see her back with all questions answered today and the fact she may require MRI if symptoms persist

## 2024-07-24 ENCOUNTER — Encounter: Payer: Self-pay | Admitting: Podiatry

## 2024-07-24 ENCOUNTER — Ambulatory Visit (INDEPENDENT_AMBULATORY_CARE_PROVIDER_SITE_OTHER)

## 2024-07-24 ENCOUNTER — Ambulatory Visit: Admitting: Podiatry

## 2024-07-24 DIAGNOSIS — M7672 Peroneal tendinitis, left leg: Secondary | ICD-10-CM | POA: Diagnosis not present

## 2024-07-24 MED ORDER — TRIAMCINOLONE ACETONIDE 10 MG/ML IJ SUSP
10.0000 mg | Freq: Once | INTRAMUSCULAR | Status: AC
  Administered 2024-07-24: 10 mg via INTRA_ARTICULAR

## 2024-07-28 ENCOUNTER — Ambulatory Visit: Admitting: Podiatry

## 2024-07-28 NOTE — Progress Notes (Signed)
 Subjective:   Patient ID: [name] [name], female   DOB: 58 y.o.   MRN: [id number]   HPI Patient states she has had a significant reoccurrence of pain in the outside of her left foot and states that she is having a lot of trouble walking with it and is concerned about the swelling mechanism   ROS      Objective:  Physical Exam  Neurovascular status intact with inflammation of the significant nature lateral side left foot with difficulty with any form of ambulation or activity     Assessment:  Appears to be acute peroneal tendinitis left with inflammation fluid buildup with slight possibility for tendon tear     Plan:  H&P reviewed at this time due to the intensity of discomfort I did immobilize with air fracture walker advised on ice I did a careful sheath injection into a different area 3 mg dexamethasone  Kenalog  5 mg Xylocaine  and I want her to wear the boot full-time for 3 weeks and then gradual for 3 more weeks.  Reappoint as symptoms indicate  Precautionary x-rays today were negative for signs of an acute injury to this area with the increase in intensity of pain negative for signs of fracture or arthritis

## 2024-07-30 ENCOUNTER — Telehealth: Payer: Self-pay

## 2024-07-30 ENCOUNTER — Ambulatory Visit (INDEPENDENT_AMBULATORY_CARE_PROVIDER_SITE_OTHER): Admitting: Obstetrics and Gynecology

## 2024-07-30 ENCOUNTER — Encounter: Payer: Self-pay | Admitting: Obstetrics and Gynecology

## 2024-07-30 VITALS — BP 152/104 | HR 112 | Temp 98.4°F | Ht 66.0 in | Wt 253.0 lb

## 2024-07-30 DIAGNOSIS — Z1331 Encounter for screening for depression: Secondary | ICD-10-CM

## 2024-07-30 DIAGNOSIS — Z01419 Encounter for gynecological examination (general) (routine) without abnormal findings: Secondary | ICD-10-CM

## 2024-07-30 DIAGNOSIS — C50311 Malignant neoplasm of lower-inner quadrant of right female breast: Secondary | ICD-10-CM | POA: Diagnosis not present

## 2024-07-30 DIAGNOSIS — R1013 Epigastric pain: Secondary | ICD-10-CM | POA: Diagnosis not present

## 2024-07-30 DIAGNOSIS — Z170 Estrogen receptor positive status [ER+]: Secondary | ICD-10-CM

## 2024-07-30 NOTE — Assessment & Plan Note (Signed)
 Cervical cancer screening performed according to ASCCP guidelines. Encouraged annual mammogram screening Colonoscopy UTD DXA 2021 wnl Labs and immunizations with her primary Encouraged safe sexual practices as indicated Encouraged healthy lifestyle practices with diet and exercise For patients under 50-60yo, I recommend 1200mg  calcium daily and 600IU of vitamin D daily.

## 2024-07-30 NOTE — Assessment & Plan Note (Signed)
 Normal breast exam today MMG UTD Continue to follow with oncology

## 2024-07-30 NOTE — Patient Instructions (Signed)
 For patients under 50-60yo, I recommend 1200mg  calcium  daily and 600IU of vitamin D daily. For patients over 60yo, I recommend 1200mg  calcium  daily and 800IU of vitamin D daily.  Health Maintenance, Female Adopting a healthy lifestyle and getting preventive care are important in promoting health and wellness. Ask your health care provider about: The right schedule for you to have regular tests and exams. Things you can do on your own to prevent diseases and keep yourself healthy. What should I know about diet, weight, and exercise? Eat a healthy diet  Eat a diet that includes plenty of vegetables, fruits, low-fat dairy products, and lean protein. Do not eat a lot of foods that are high in solid fats, added sugars, or sodium. Maintain a healthy weight Body mass index (BMI) is used to identify weight problems. It estimates body fat based on height and weight. Your health care provider can help determine your BMI and help you achieve or maintain a healthy weight. Get regular exercise Get regular exercise. This is one of the most important things you can do for your health. Most adults should: Exercise for at least 150 minutes each week. The exercise should increase your heart rate and make you sweat (moderate-intensity exercise). Do strengthening exercises at least twice a week. This is in addition to the moderate-intensity exercise. Spend less time sitting. Even light physical activity can be beneficial. Watch cholesterol and blood lipids Have your blood tested for lipids and cholesterol at 60 years of age, then have this test every 5 years. Have your cholesterol levels checked more often if: Your lipid or cholesterol levels are high. You are older than 60 years of age. You are at high risk for heart disease. What should I know about cancer screening? Depending on your health history and family history, you may need to have cancer screening at various ages. This may include screening  for: Breast cancer. Cervical cancer. Colorectal cancer. Skin cancer. Lung cancer. What should I know about heart disease, diabetes, and high blood pressure? Blood pressure and heart disease High blood pressure causes heart disease and increases the risk of stroke. This is more likely to develop in people who have high blood pressure readings or are overweight. Have your blood pressure checked: Every 3-5 years if you are 25-57 years of age. Every year if you are 24 years old or older. Diabetes Have regular diabetes screenings. This checks your fasting blood sugar level. Have the screening done: Once every three years after age 62 if you are at a normal weight and have a low risk for diabetes. More often and at a younger age if you are overweight or have a high risk for diabetes. What should I know about preventing infection? Hepatitis B If you have a higher risk for hepatitis B, you should be screened for this virus. Talk with your health care provider to find out if you are at risk for hepatitis B infection. Hepatitis C Testing is recommended for: Everyone born from [birth year] through 1967. Anyone with known risk factors for hepatitis C. Sexually transmitted infections (STIs) Get screened for STIs, including gonorrhea and chlamydia, if: You are sexually active and are younger than 60 years of age. You are older than 60 years of age and your health care provider tells you that you are at risk for this type of infection. Your sexual activity has changed since you were last screened, and you are at increased risk for chlamydia or gonorrhea. Ask your health care provider if  you are at risk. Ask your health care provider about whether you are at high risk for HIV. Your health care provider may recommend a prescription medicine to help prevent HIV infection. If you choose to take medicine to prevent HIV, you should first get tested for HIV. You should then be tested every 3 months for as long as you  are taking the medicine. Osteoporosis and menopause Osteoporosis is a disease in which the bones lose minerals and strength with aging. This can result in bone fractures. If you are 72 years old or older, or if you are at risk for osteoporosis and fractures, ask your health care provider if you should: Be screened for bone loss. Take a calcium  or vitamin D supplement to lower your risk of fractures. Be given hormone replacement therapy (HRT) to treat symptoms of menopause. Follow these instructions at home: Alcohol use Do not drink alcohol if: Your health care provider tells you not to drink. You are pregnant, may be pregnant, or are planning to become pregnant. If you drink alcohol: Limit how much you have to: 0-1 drink a day. Know how much alcohol is in your drink. In the [place] one drink equals one 12 oz bottle of beer (355 mL), one 5 oz glass of wine (148 mL), or one 1 oz glass of hard liquor (44 mL). Lifestyle Do not use any products that contain nicotine or tobacco. These products include cigarettes, chewing tobacco, and vaping devices, such as e-cigarettes. If you need help quitting, ask your health care provider. Do not use street drugs. Do not share needles. Ask your health care provider for help if you need support or information about quitting drugs. General instructions Schedule regular health, dental, and eye exams. Stay current with your vaccines. Tell your health care provider if: You often feel depressed. You have ever been abused or do not feel safe at home. Summary Adopting a healthy lifestyle and getting preventive care are important in promoting health and wellness. Follow your health care provider's instructions about healthy diet, exercising, and getting tested or screened for diseases. Follow your health care provider's instructions on monitoring your cholesterol and blood pressure. This information is not intended to replace advice given to you by your health  care provider. Make sure you discuss any questions you have with your health care provider. Document Revised: 12/27/2020 Document Reviewed: 12/27/2020 Elsevier Patient Education  2024 [employer].

## 2024-07-30 NOTE — Telephone Encounter (Signed)
 Received call from pt regarding new sx she's experiencing. Pt reports spasm type pain in her right arm, back and breast that started early this year but has gotten progressively worst. She also reports worsening neuropathy in hands and feet. RN advised pt see Morna Kendall NP. Appt made w/ pt in agreement. Pt verbalized understanding.

## 2024-07-30 NOTE — Progress Notes (Signed)
 58 y.o. [id number] postmenopausal female with history of right breat cancer (dx 2019, s/p lumpectomy, chemo, RT, on tamoxifen , started ~2023) here for annual exam. Divorced, works in retail banker. ERE:Ujoanuu, Karna Jean, FNP   She reports chronic progressive cramping involving right back, scapula, chest wall, and epigastric region. Pain is constant but is worse after meals and positional. Affecting sleep and mood. She is tearful discussing this today because she has not gotten answers over the past year. Muscle relaxant is not helping She has seen GI with RUQ US  that is normal. No reflux sx. Started linzess  for constipation Evaluated by cards for precordial pain Planning to see ortho next week. Care everywhere chart mention thoracic radiculopathy, discussed this with patient  Postmenopausal bleeding: none Pelvic discharge or pain: none Breast mass, nipple discharge or skin changes : none Last PAP:     Component Value Date/Time   DIAGPAP  05/31/2022 0825    - Negative for intraepithelial lesion or malignancy (NILM)   DIAGPAP  04/16/2018 0000    NEGATIVE FOR INTRAEPITHELIAL LESIONS OR MALIGNANCY.   DIAGPAP  04/16/2018 0000    A LETTER WAS SENT TO THE PATIENT INFORMING HER OF THE ABOVE RESULTS.   HPVHIGH Negative 05/31/2022 0825   ADEQPAP  05/31/2022 0825    Satisfactory for evaluation; transformation zone component ABSENT.   ADEQPAP  04/16/2018 0000    Satisfactory for evaluation  endocervical/transformation zone component PRESENT.   Last mammogram: 04/25/24 dx MMG BIRADS 1, density B  Last colonoscopy: 09/07/17, q 10 years Last DXA: 10/15/19 wnl  Sexually active: no  Exercising: no due to recent foot surgery Smoker: no  Flowsheet Row Office Visit from 07/30/2024 in [facility]  PHQ-2 Total Score 6   Flowsheet Row Office Visit from 08/26/2020 in [facility] Total Score 9   GYN HISTORY: history of  right breat cancer (dx 2019, s/p lumpectomy, chemo, RT, on tamoxifene)  OB History  Gravida Para Term Preterm AB Living  3 3 2   3   SAB IAB Ectopic Multiple Live Births      3    # Outcome Date GA Lbr Len/2nd Weight Sex Type Anes PTL Lv  3 Para     M CS-Unspec     2 Term      Vag-Spont   LIV  1 Term      Vag-Spont   LIV    Past Medical History:  Diagnosis Date   Allergy    seasonal   Anal fissure    Back pain    Breast cancer (HCC) 2019   R BREAST IDC   Complication of anesthesia    Constipation    Diverticulitis    Fatty liver    GERD (gastroesophageal reflux disease) 07/07/2005   Heartburn    Hemorrhoid    Hepatic steatosis    Hypertension    Internal hemorrhoids    Joint pain    Malignant neoplasm of lower-inner quadrant of right breast of female, estrogen receptor positive (HCC) 07/16/2018   Personal history of chemotherapy    Personal history of radiation therapy    PONV (postoperative nausea and vomiting)    Positive H. pylori test    SOBOE (shortness of breath on exertion)     Past Surgical History:  Procedure Laterality Date   BREAST BIOPSY Right 07/12/2018   R BREAST IDC   BREAST LUMPECTOMY Right 08/22/2018   IDC   BREAST  LUMPECTOMY WITH RADIOACTIVE SEED AND SENTINEL LYMPH NODE BIOPSY Right 08/23/2018   Procedure: RIGHT BREAST LUMPECTOMY WITH RADIOACTIVE SEED AND RIGHT SENTINEL LYMPH NODE MAPPING;  Surgeon: Vanderbilt Ned, MD;  Location: [facility] OR;  Service: General;  Laterality: Right;   COLON RESECTION  2000's Dr. Gladis   Diverticulitis.   COLONOSCOPY     FOOT SURGERY Left    g3p2 c-section1     KNEE SURGERY Right    PORT-A-CATH REMOVAL Right 04/15/2019   Procedure: REMOVAL PORT-A-CATH;  Surgeon: Vanderbilt Ned, MD;  Location: [facility];  Service: General;  Laterality: Right;  MAC AND LOCAL ANESTHETIC   PORTACATH PLACEMENT Right 09/24/2018   Procedure: INSERTION PORT-A-CATH WITH ULTRASOUND;  Surgeon: Vanderbilt Ned, MD;  Location:  [facility];  Service: General;  Laterality: Right;   SHOULDER SURGERY Left 05/18/2022   TONSILLECTOMY     TUBAL LIGATION      Current Outpatient Medications on File Prior to Visit  Medication Sig Dispense Refill   AMBULATORY NON FORMULARY MEDICATION Medication Name: Diltiazem  2%/Lidocaine  2%   Using your index finger apply a small amount of medication inside the anal opening and to the external anal area three times daily x 6-8 weeks. 30 g 1   amLODipine  (NORVASC ) 5 MG tablet Take 2 tablets (10 mg total) by mouth daily.     azelastine (OPTIVAR) 0.05 % ophthalmic solution Place 1 drop into both eyes 2 (two) times daily.     benzonatate  (TESSALON ) 100 MG capsule Take 1 capsule (100 mg total) by mouth every 8 (eight) hours. 21 capsule 0   cetirizine (ZYRTEC) 10 MG tablet Take 10 mg by mouth daily.     Cholecalciferol (VITAMIN D ) 50 MCG (2000 UT) CAPS Take 1 capsule (2,000 Units total) by mouth daily. 30 capsule 0   clobetasol  ointment (TEMOVATE ) 0.05 % Apply 1 application  topically 2 (two) times daily. Then as needed for 30 days  1   clotrimazole -betamethasone  (LOTRISONE ) cream Apply 1 application topically 2 (two) times daily. For 5-7 days 30 g 0   diclofenac  (VOLTAREN ) 75 MG EC tablet Take 1 tablet (75 mg total) by mouth 2 (two) times daily. 50 tablet 2   etodolac  (LODINE ) 400 MG tablet Take 1 tablet (400 mg total) by mouth 2 (two) times daily. 20 tablet 0   guaiFENesin  (MUCINEX ) 600 MG 12 hr tablet Take 2 tablets (1,200 mg total) by mouth 2 (two) times daily. 30 tablet 0   hydrOXYzine (ATARAX) 25 MG tablet Take 25 mg by mouth 3 (three) times daily as needed.     losartan  (COZAAR ) 50 MG tablet Take 50 mg by mouth daily.     Menaquinone-7 (VITAMIN K2 PO) Take by mouth.     montelukast (SINGULAIR) 10 MG tablet Take 10 mg by mouth at bedtime.     ondansetron  (ZOFRAN ) 4 MG tablet Take 1 tablet (4 mg total) by mouth every 8 (eight) hours as needed for nausea or vomiting. 20 tablet  0   sertraline  (ZOLOFT ) 50 MG tablet Take 1 tablet (50 mg total) by mouth daily. 90 tablet 1   tamoxifen  (NOLVADEX ) 10 MG tablet TAKE 1 TABLET(10 MG) BY MOUTH TWICE DAILY 90 tablet 3   No current facility-administered medications on file prior to visit.    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 Married '2000 - 60yrs/seperated. 2 boys - '82, '87, 1 dtr - '81.   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    Social Drivers of Corporate Investment Banker Strain: Not on file  Food Insecurity: Not on file  Transportation Needs: Not on file  Physical Activity: Not on file  Stress: Not on file  Social Connections: Not on file  Intimate Partner Violence: Not on file    Family History  Problem Relation Age of Onset   Hypertension Mother    Breast cancer Mother 52   Cancer Mother    Alcoholism Mother    Drug abuse Mother    Hypertension Father    Heart disease Father    Heart attack Father    Hypertension Brother     Allergies  Allergen Reactions   Ibuprofen Swelling    SWELLING REACTION UNSPECIFIED    Lisinopril Swelling    angioedema   Dilaudid  [Hydromorphone ] Other (See Comments)   Dilaudid  [Hydromorphone  Hcl] Nausea Only   Tape Rash      PE Today's Vitals   07/30/24 1011 07/30/24 1015  BP: (!) 156/110 (!) 152/104  Pulse: (!) 112   Temp: 98.4 F (36.9 C)    TempSrc: Oral   SpO2: 97%   Weight: 253 lb (114.8 kg)   Height: 5' 6 (1.676 m)     Body mass index is 40.84 kg/m.  Physical Exam Vitals reviewed. Exam conducted with a chaperone present.  Constitutional:      General: She is not in acute distress.    Appearance: Normal appearance.  HENT:     Head: Normocephalic and atraumatic.     Nose: Nose normal.  Eyes:     Extraocular Movements: Extraocular movements intact.     Conjunctiva/sclera: Conjunctivae normal.  Pulmonary:     Effort: Pulmonary effort is normal.  Chest:     Chest wall: No mass or tenderness.  Breasts:    Right: Normal. No swelling, mass, nipple discharge, skin change or tenderness.     Left: Normal. No swelling, mass, nipple discharge, skin change or tenderness.    Abdominal:     General: There is no distension.     Palpations: Abdomen is soft.     Tenderness: There is no abdominal tenderness.  Genitourinary:    General: Normal vulva.     Exam position: Lithotomy position.     Urethra: No prolapse.     Vagina: Normal. No vaginal discharge or bleeding.     Cervix: Normal. No lesion.     Uterus: Normal. Not enlarged and not tender.      Adnexa: Right adnexa normal and left adnexa normal.  Musculoskeletal:        General: Normal range of motion.  Lymphadenopathy:     Upper Body:     Right upper body: No axillary adenopathy.     Left upper body: No axillary adenopathy.     Lower Body: No  right inguinal adenopathy. No left inguinal adenopathy.  Skin:    General: Skin is warm and dry.  Neurological:     General: No focal deficit present.     Mental Status: She is alert.  Psychiatric:        Mood and Affect: Mood normal.        Behavior: Behavior normal.       Assessment and Plan:        Well woman exam with routine gynecological exam Assessment & Plan: Cervical cancer screening performed according to ASCCP guidelines. Encouraged annual mammogram screening Colonoscopy UTD DXA 2021 wnl Labs  and immunizations with her primary Encouraged safe sexual practices as indicated Encouraged healthy lifestyle practices with diet and exercise For patients under 50-70yo, I recommend 1200mg  calcium  daily and 600IU of vitamin D  daily.    Malignant neoplasm of lower-inner quadrant of right breast of female, estrogen receptor positive (HCC) Assessment & Plan: Normal breast exam today MMG UTD Continue to follow with oncology   Epigastric pain Thoracic radiculopathy -     Lipase Will check lipase, last normal Jan 2025 May benefit from neuropathic treatment like gabapentin , will discuss with ortho at appt on 08/04/24 Consider pain management referral  Negative depression screening  Shandricka Monroy LULLA Pa, MD

## 2024-07-31 ENCOUNTER — Ambulatory Visit: Payer: Self-pay | Admitting: Obstetrics and Gynecology

## 2024-07-31 LAB — LIPASE: Lipase: 13 U/L (ref 7–60)

## 2024-08-04 ENCOUNTER — Inpatient Hospital Stay: Admitting: Adult Health

## 2024-08-27 ENCOUNTER — Encounter: Payer: Self-pay | Admitting: Adult Health

## 2024-08-27 ENCOUNTER — Inpatient Hospital Stay: Attending: Adult Health | Admitting: Adult Health

## 2024-08-27 VITALS — BP 142/78 | HR 99 | Temp 97.7°F | Resp 17 | Wt 251.5 lb

## 2024-08-27 DIAGNOSIS — Z79810 Long term (current) use of selective estrogen receptor modulators (SERMs): Secondary | ICD-10-CM | POA: Insufficient documentation

## 2024-08-27 DIAGNOSIS — Z923 Personal history of irradiation: Secondary | ICD-10-CM | POA: Insufficient documentation

## 2024-08-27 DIAGNOSIS — Z170 Estrogen receptor positive status [ER+]: Secondary | ICD-10-CM | POA: Insufficient documentation

## 2024-08-27 DIAGNOSIS — Z9221 Personal history of antineoplastic chemotherapy: Secondary | ICD-10-CM | POA: Diagnosis not present

## 2024-08-27 DIAGNOSIS — C50311 Malignant neoplasm of lower-inner quadrant of right female breast: Secondary | ICD-10-CM | POA: Diagnosis not present

## 2024-08-27 DIAGNOSIS — Z1732 Human epidermal growth factor receptor 2 negative status: Secondary | ICD-10-CM | POA: Diagnosis not present

## 2024-08-27 DIAGNOSIS — Z1722 Progesterone receptor negative status: Secondary | ICD-10-CM | POA: Insufficient documentation

## 2024-08-27 NOTE — Progress Notes (Signed)
 [name]:    [name] Karna Jean, FNP [phone] [place] [address]   DIAGNOSIS:  Cancer Staging  Malignant neoplasm of lower-inner quadrant of right breast of female, estrogen receptor positive (HCC) Staging form: Breast, AJCC 8th Edition - Clinical: Stage IB (cT1b, cN0, cM0, G3, ER+, PR-, HER2-) - Signed by Odean Potts, MD on 07/24/2018 Neoadjuvant therapy: No Histologic grading system: 3 grade system Laterality: Right Tumor size (mm): 6 - Pathologic: Stage IA (pT1b, pN0, cM0, G3, ER+, PR-, HER2-) - Signed by Crawford Morna Pickle, NP on 09/04/2018 Histologic grading system: 3 grade system    SUMMARY OF ONCOLOGIC HISTORY: Oncology History  Malignant neoplasm of lower-inner quadrant of right breast of female, estrogen receptor positive (HCC)  07/16/2018 Initial Diagnosis   Pain right breast UOQ: Negative on mammogram, incidentally found right breast mass LIQ at 3:30 position measuring 0.6 cm biopsy revealed grade 3 IDC ER 40%, PR 0%, HER-2 negative, Ki-67 50%, T1bN0 stage Ib   07/24/2018 Cancer Staging   Staging form: Breast, AJCC 8th Edition - Clinical: Stage IB (cT1b, cN0, cM0, G3, ER+, PR-, HER2-) - Signed by Odean Potts, MD on 07/24/2018   08/23/2018 Surgery   Right lumpectomy: IDC, 0.7 cm, grade 3, margins negative, 0/3 lymph nodes negative, ER 40%, PR 0%, HER-2 negative, Ki-67 50%, T1BN0 stage Ia   08/23/2018 Oncotype testing   45/33%   09/04/2018 Cancer Staging   Staging form: Breast, AJCC 8th Edition - Pathologic: Stage IA (pT1b, pN0, cM0, G3, ER+, PR-, HER2-) - Signed by Crawford Morna Pickle, NP on 09/04/2018   09/25/2018 - 01/22/2019 Chemotherapy   Adjuvant chemotherapy: completed 4 cycles dose dense Adriamycin  and Cytoxan , completed 10 cycles of Taxol , stopped early due to neuropathy    02/19/2019 - 04/07/2019 Radiation Therapy   Adjuvant XRT The patient initially received a dose of 50.4 Gy in 28 fractions to the  breast using whole-breast tangent fields. This was delivered using a 3-D conformal technique. The patient then received a boost to the seroma. This delivered an additional 10 Gy in 5 fractions using a 3-field photon boost technique. The total dose was 60.4 Gy.   04/2019 -  Anti-estrogen oral therapy   Anastrozole  switched to letrozole  switched to Exemestane       CURRENT THERAPY: tamoxifen   INTERVAL HISTORY:  Discussed the use of AI scribe software for clinical note transcription with the patient, who gave verbal consent to proceed.  History of Present Illness [name] is a 59 year old woman with ER-positive right breast invasive ductal carcinoma, status post lumpectomy, chemotherapy, and radiation, currently on tamoxifen , who presents for evaluation of persistent back and abdominal spasms with weakness.  She remains on adjuvant tamoxifen  after lumpectomy, chemotherapy, and radiation for ER-positive right breast invasive ductal carcinoma. She previously stopped aromatase inhibitors due to intolerance. In September 2025 she noted a palpable left breast abnormality, but mammogram and ultrasound showed no malignancy, with breast density category B. She is planned for annual bilateral screening mammography in February 2026.  She has persistent diffuse back and abdominal spasms occurring daily, sometimes lasting all day. The spasms are associated with weakness, fatigue, and occasional near-syncope, can be disabling, and are worsened by movement or coughing but may also occur at rest. She denies fevers, chills, new or worsening leg swelling, focal neurologic deficits, hip pain, and increased shortness of breath apart from transient dyspnea during spasms.   Extensive laboratory evaluation including potassium and other blood work has  been unremarkable. She has seen a gastroenterologist without symptom relief. Symptoms persist despite normal labs and supportive care with muscle  relaxers.     Patient Active Problem List   Diagnosis Date Noted   Precordial pain 05/19/2024   Well woman exam with routine gynecological exam 07/05/2023   Chronic allergic conjunctivitis 12/08/2021   Vasomotor rhinitis 12/08/2021   Insulin  resistance 10/21/2020   Gastroesophageal reflux disease 10/21/2020   White coat syndrome with hypertension 10/21/2020   At risk for diabetes mellitus 10/21/2020   Constipation 09/30/2020   SOBOE (shortness of breath on exertion) 08/26/2020   Vitamin D  deficiency 08/26/2020   Other fatigue 08/26/2020   At risk for impaired metabolic function 08/26/2020   Elevated coronary artery calcium  score 07/14/2020   Mixed hyperlipidemia 07/14/2020   Atypical chest pain 07/08/2020   Chronic fatigue 07/07/2020   Left arm pain 12/05/2019   Leg edema 05/31/2019   Port-A-Cath in place 09/25/2018   Malignant neoplasm of lower-inner quadrant of right breast of female, estrogen receptor positive (HCC) 07/16/2018   Screening cholesterol level 07/10/2017   Anal fissure 07/07/2014   Pruritic rash 06/16/2014   Class 2 severe obesity with serious comorbidity and body mass index (BMI) of 38.0 to 38.9 in adult 06/16/2014   Primary localized osteoarthrosis, lower leg 11/18/2013   Bilateral knee pain 09/08/2013   VITAMIN B12 DEFICIENCY 02/10/2010   Primary hypertension 08/16/2007   ALLERGIC RHINITIS 08/16/2007   GERD 08/16/2007   Right upper quadrant pain 05/13/2007    is allergic to ibuprofen, lisinopril, dilaudid  [hydromorphone ], gabapentin , dilaudid  [hydromorphone  hcl], and tape.  MEDICAL HISTORY: Past Medical History:  Diagnosis Date   Allergy    seasonal   Anal fissure    Back pain    Breast cancer (HCC) 2019   R BREAST IDC   Complication of anesthesia    Constipation    Diverticulitis    Fatty liver    GERD (gastroesophageal reflux disease) 07/07/2005   Heartburn    Hemorrhoid    Hepatic steatosis    Hypertension    Internal hemorrhoids     Joint pain    Malignant neoplasm of lower-inner quadrant of right breast of female, estrogen receptor positive (HCC) 07/16/2018   Personal history of chemotherapy    Personal history of radiation therapy    PONV (postoperative nausea and vomiting)    Positive H. pylori test    SOBOE (shortness of breath on exertion)     SURGICAL HISTORY: Past Surgical History:  Procedure Laterality Date   BREAST BIOPSY Right 07/12/2018   R BREAST IDC   BREAST LUMPECTOMY Right 08/22/2018   IDC   BREAST LUMPECTOMY WITH RADIOACTIVE SEED AND SENTINEL LYMPH NODE BIOPSY Right 08/23/2018   Procedure: RIGHT BREAST LUMPECTOMY WITH RADIOACTIVE SEED AND RIGHT SENTINEL LYMPH NODE MAPPING;  Surgeon: Vanderbilt Ned, MD;  Location: [facility] OR;  Service: General;  Laterality: Right;   COLON RESECTION  2000's Dr. Gladis   Diverticulitis.   COLONOSCOPY     FOOT SURGERY Left    g3p2 c-section1     KNEE SURGERY Right    PORT-A-CATH REMOVAL Right 04/15/2019   Procedure: REMOVAL PORT-A-CATH;  Surgeon: Vanderbilt Ned, MD;  Location: [name];  Service: General;  Laterality: Right;  MAC AND LOCAL ANESTHETIC   PORTACATH PLACEMENT Right 09/24/2018   Procedure: INSERTION PORT-A-CATH WITH ULTRASOUND;  Surgeon: Vanderbilt Ned, MD;  Location: [name];  Service: General;  Laterality: Right;   SHOULDER SURGERY Left 05/18/2022  TONSILLECTOMY     TUBAL LIGATION      SOCIAL HISTORY: Social History   Socioeconomic History   Marital status: Divorced    Spouse name: Not on file   Number of children: 3   Years of education: 14   Highest education level: Not on file  Occupational History   Occupation: Investment Banker, 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 Married '2000 - 46yrs/seperated. 2 boys - '82, '87, 1 dtr - '81.   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    Social Drivers of Health   Tobacco Use: Low Risk (08/27/2024)   Patient History    Smoking Tobacco Use: Never    Smokeless Tobacco Use: Never    Passive Exposure: Not on file  Financial Resource Strain: Low Risk (08/27/2024)   Overall Financial Resource Strain (CARDIA)    Difficulty of Paying Living Expenses: Not hard at all  Food Insecurity: No Food Insecurity (08/27/2024)   Epic    Worried About Programme Researcher, Broadcasting/film/video in the Last Year: Never true    Ran Out of Food in the Last Year: Never true  Transportation Needs: No Transportation Needs (08/27/2024)   Epic    Lack of Transportation (Medical): No    Lack of Transportation (Non-Medical): No  Physical Activity: Inactive (08/27/2024)   Exercise Vital Sign    Days of Exercise per Week: 0 days    Minutes of Exercise per Session: 0 min  Stress: No Stress Concern Present (08/27/2024)   [employer] of Occupational Health - Occupational Stress Questionnaire    Feeling of Stress: Not at all  Social Connections: Moderately Isolated (08/27/2024)   Social Connection and Isolation Panel    Frequency of Communication with Friends and Family: More than three times a week    Frequency of Social Gatherings with Friends and Family: Three times a week    Attends Religious Services: More than 4 times per year    Active Member of Clubs or Organizations: No    Attends Banker Meetings: Never    Marital Status: Divorced  Catering Manager Violence: Not At Risk (08/27/2024)   Epic    Fear of Current or Ex-Partner: No    Emotionally Abused: No    Physically Abused: No    Sexually Abused: No  Depression (PHQ2-9): Low Risk (08/27/2024)   Depression (PHQ2-9)    PHQ-2 Score: 0  Recent Concern: Depression  (PHQ2-9) - Medium Risk (07/30/2024)   Depression (PHQ2-9)    PHQ-2 Score: 6  Alcohol Screen: Low Risk (08/27/2024)   Alcohol Screen    Last Alcohol Screening Score (AUDIT): 0  Housing: Unknown (08/27/2024)   Epic    Unable to Pay for Housing in the Last Year: No    Number of Times Moved in the Last Year: Not on file    Homeless in the Last Year: No  Utilities: Not At Risk (08/27/2024)   Epic    Threatened with loss of utilities: No  Health Literacy: Adequate Health Literacy (08/27/2024)   B1300 Health Literacy  Frequency of need for help with medical instructions: Never    FAMILY HISTORY: Family History  Problem Relation Age of Onset   Hypertension Mother    Breast cancer Mother 9   Cancer Mother    Alcoholism Mother    Drug abuse Mother    Hypertension Father    Heart disease Father    Heart attack Father    Hypertension Brother     Review of Systems  Constitutional:  Positive for fatigue. Negative for appetite change, chills, fever and unexpected weight change.  HENT:   Negative for hearing loss, lump/mass and trouble swallowing.   Eyes:  Negative for eye problems and icterus.  Respiratory:  Negative for chest tightness, cough and shortness of breath.   Cardiovascular:  Negative for chest pain, leg swelling and palpitations.  Gastrointestinal:  Negative for abdominal distention, abdominal pain, constipation, diarrhea, nausea and vomiting.  Endocrine: Negative for hot flashes.  Genitourinary:  Negative for difficulty urinating.   Musculoskeletal:  Negative for arthralgias.  Skin:  Negative for itching and rash.  Neurological:  Negative for dizziness, extremity weakness, headaches and numbness.  Hematological:  Negative for adenopathy. Does not bruise/bleed easily.  Psychiatric/Behavioral:  Negative for depression. The patient is not nervous/anxious.       PHYSICAL EXAMINATION    Vitals:   08/27/24 0922  BP: (!) 142/78  Pulse: 99  Resp: 17  Temp: 97.7 F (36.5  C)  SpO2: 100%    Physical Exam Constitutional:      General: She is not in acute distress.    Appearance: Normal appearance. She is not toxic-appearing.  HENT:     Head: Normocephalic and atraumatic.     Mouth/Throat:     Mouth: Mucous membranes are moist.     Pharynx: Oropharynx is clear. No oropharyngeal exudate or posterior oropharyngeal erythema.  Eyes:     General: No scleral icterus. Cardiovascular:     Rate and Rhythm: Normal rate and regular rhythm.     Pulses: Normal pulses.     Heart sounds: Normal heart sounds.  Pulmonary:     Effort: Pulmonary effort is normal.     Breath sounds: Normal breath sounds.  Chest:     Comments: Right breast s/p lumpectomy and radiation; no sign of local recurrence, left breast benign Abdominal:     General: Abdomen is flat. Bowel sounds are normal. There is no distension.     Palpations: Abdomen is soft.     Tenderness: There is no abdominal tenderness.  Musculoskeletal:        General: No swelling.     Cervical back: Neck supple.  Lymphadenopathy:     Cervical: No cervical adenopathy.     Upper Body:     Right upper body: No supraclavicular or axillary adenopathy.     Left upper body: No supraclavicular or axillary adenopathy.  Skin:    General: Skin is warm and dry.     Findings: No rash.  Neurological:     General: No focal deficit present.     Mental Status: She is alert.  Psychiatric:        Mood and Affect: Mood normal.        Behavior: Behavior normal.     LABORATORY DATA:  CBC    Component Value Date/Time   WBC 5.7 09/06/2023 0727   RBC 4.10 09/06/2023 0727   HGB 12.6 09/06/2023 0727   HGB 12.9 04/28/2021 1109   HCT 39.3 09/06/2023 0727   HCT  40.1 04/28/2021 1109   PLT 134 (L) 09/06/2023 0727   PLT 129 (L) 04/28/2021 1109   MCV 95.9 09/06/2023 0727   MCV 94 04/28/2021 1109   MCH 30.7 09/06/2023 0727   MCHC 32.1 09/06/2023 0727   RDW 13.3 09/06/2023 0727   RDW 12.8 04/28/2021 1109   LYMPHSABS 1.4  09/06/2023 0727   LYMPHSABS 1.2 04/28/2021 1109   MONOABS 0.5 09/06/2023 0727   EOSABS 0.0 09/06/2023 0727   EOSABS 0.1 04/28/2021 1109   BASOSABS 0.0 09/06/2023 0727   BASOSABS 0.0 04/28/2021 1109    CMP     Component Value Date/Time   NA 138 09/06/2023 0727   NA 142 04/27/2022 1231   K 4.1 09/06/2023 0727   CL 103 09/06/2023 0727   CO2 24 09/06/2023 0727   GLUCOSE 100 (H) 09/06/2023 0727   BUN 13 09/06/2023 0727   BUN 9 04/27/2022 1231   CREATININE 0.66 09/06/2023 0727   CREATININE 0.86 03/10/2022 1027   CALCIUM  9.3 09/06/2023 0727   PROT 7.6 09/06/2023 0727   PROT 6.8 04/27/2022 1231   ALBUMIN 4.0 09/06/2023 0727   ALBUMIN 4.4 04/27/2022 1231   AST 31 09/06/2023 0727   AST 22 03/10/2022 1027   ALT 24 09/06/2023 0727   ALT 14 03/10/2022 1027   ALKPHOS 58 09/06/2023 0727   BILITOT 0.6 09/06/2023 0727   BILITOT 0.4 04/27/2022 1231   BILITOT 0.5 03/10/2022 1027   GFRNONAA >60 09/06/2023 0727   GFRNONAA >60 03/10/2022 1027   GFRAA 89 08/26/2020 1121   GFRAA >60 01/29/2019 1010     ASSESSMENT and THERAPY PLAN:   No problem-specific Assessment & Plan notes found for this encounter.   Assessment and Plan Assessment & Plan Right breast invasive ductal carcinoma, ER positive, status post lumpectomy, adjuvant chemotherapy, and radiation, on tamoxifen  Recent left breast abnormality evaluated with negative imaging. Symptoms may be related to tamoxifen  - Patient stopped tamoxifen  for the time being, will reassess in 3 weeks  - Ordered annual bilateral screening mammogram for February 2026. - Scheduled next oncology follow-up in one year per study protocol.  Myalgia and abdominal/back spasms under evaluation Persistent, daily diffuse abdominal and back spasms with weakness and near-syncope. Extensive laboratory workup unremarkable. No dyspnea or focal neurological deficits. Etiology unclear; symptoms may be related to tamoxifen , further evaluation warranted given cancer  history and symptom chronicity. - Instructed her to hold tamoxifen  and monitor for improvement in spasms. - Ordered CT chest with contrast to evaluate for underlying pathology. - Advised to maintain adequate hydration, especially around the time of the CT scan. - Arranged follow-up in three weeks to review CT results and reassess symptoms. - Instructed her to contact the office if radiology does not call within two days to schedule the CT scan.   RTC in 3 weeks for f/u.     All questions were answered. The patient knows to call the clinic with any problems, questions or concerns. We can certainly see the patient much sooner if necessary.  Total encounter time:30 minutes*in face-to-face visit time, chart review, lab review, care coordination, order entry, and documentation of the encounter time.    Morna Kendall, NP 08/29/2024 11:40 AM Medical Oncology and Hematology [name] [address] [place], [place] [address] Tel. [phone]    Fax. [phone]  *Total Encounter Time as defined by the Centers for Medicare and Medicaid Services includes, in addition to the face-to-face time of a patient visit (documented in the note above)  non-face-to-face time: obtaining and reviewing outside history, ordering and reviewing medications, tests or procedures, care coordination (communications with other health care professionals or caregivers) and documentation in the medical record.

## 2024-08-28 ENCOUNTER — Ambulatory Visit: Admitting: Podiatry

## 2024-09-04 ENCOUNTER — Other Ambulatory Visit: Payer: Self-pay | Admitting: Hematology and Oncology

## 2024-09-04 ENCOUNTER — Ambulatory Visit (HOSPITAL_COMMUNITY)
Admission: RE | Admit: 2024-09-04 | Discharge: 2024-09-04 | Disposition: A | Discharge status: Home / Self Care | Source: Ambulatory Visit | Attending: Adult Health | Admitting: Adult Health

## 2024-09-04 DIAGNOSIS — C50311 Malignant neoplasm of lower-inner quadrant of right female breast: Secondary | ICD-10-CM | POA: Diagnosis present

## 2024-09-04 DIAGNOSIS — Z170 Estrogen receptor positive status [ER+]: Secondary | ICD-10-CM | POA: Insufficient documentation

## 2024-09-04 DIAGNOSIS — Z1231 Encounter for screening mammogram for malignant neoplasm of breast: Secondary | ICD-10-CM

## 2024-09-04 MED ORDER — IOHEXOL 300 MG/ML  SOLN
75.0000 mL | Freq: Once | INTRAMUSCULAR | Status: AC | PRN
  Administered 2024-09-04: 75 mL via INTRAVENOUS

## 2024-09-16 ENCOUNTER — Other Ambulatory Visit (HOSPITAL_COMMUNITY): Payer: Self-pay | Admitting: Gastroenterology

## 2024-09-16 DIAGNOSIS — R1011 Right upper quadrant pain: Secondary | ICD-10-CM

## 2024-09-17 ENCOUNTER — Inpatient Hospital Stay: Admitting: Adult Health

## 2024-09-17 DIAGNOSIS — C50311 Malignant neoplasm of lower-inner quadrant of right female breast: Secondary | ICD-10-CM | POA: Diagnosis not present

## 2024-09-17 DIAGNOSIS — Z170 Estrogen receptor positive status [ER+]: Secondary | ICD-10-CM

## 2024-09-25 ENCOUNTER — Ambulatory Visit (HOSPITAL_COMMUNITY)

## 2024-09-29 ENCOUNTER — Other Ambulatory Visit (HOSPITAL_COMMUNITY)

## 2024-10-06 ENCOUNTER — Ambulatory Visit

## 2024-10-09 ENCOUNTER — Other Ambulatory Visit (HOSPITAL_COMMUNITY)

## 2024-10-22 ENCOUNTER — Ambulatory Visit: Admitting: Hematology and Oncology

## 2025-08-03 ENCOUNTER — Ambulatory Visit: Admitting: Obstetrics and Gynecology
